# Patient Record
Sex: Female | Born: 1935
Health system: Southern US, Community
[De-identification: ages and names within clinical notes are randomized; demographics above are authoritative.]

## PROBLEM LIST (undated history)

## (undated) DIAGNOSIS — I714 Abdominal aortic aneurysm, without rupture, unspecified: Secondary | ICD-10-CM

## (undated) DIAGNOSIS — K5730 Diverticulosis of large intestine without perforation or abscess without bleeding: Secondary | ICD-10-CM

## (undated) DIAGNOSIS — M545 Low back pain, unspecified: Secondary | ICD-10-CM

## (undated) DIAGNOSIS — I639 Cerebral infarction, unspecified: Secondary | ICD-10-CM

## (undated) DIAGNOSIS — K2270 Barrett's esophagus without dysplasia: Secondary | ICD-10-CM

## (undated) DIAGNOSIS — H3530 Unspecified macular degeneration: Secondary | ICD-10-CM

## (undated) DIAGNOSIS — I10 Essential (primary) hypertension: Secondary | ICD-10-CM

## (undated) DIAGNOSIS — B029 Zoster without complications: Secondary | ICD-10-CM

## (undated) DIAGNOSIS — J449 Chronic obstructive pulmonary disease, unspecified: Secondary | ICD-10-CM

## (undated) DIAGNOSIS — K219 Gastro-esophageal reflux disease without esophagitis: Secondary | ICD-10-CM

## (undated) DIAGNOSIS — M719 Bursopathy, unspecified: Secondary | ICD-10-CM

## (undated) DIAGNOSIS — I5189 Other ill-defined heart diseases: Secondary | ICD-10-CM

## (undated) DIAGNOSIS — K922 Gastrointestinal hemorrhage, unspecified: Secondary | ICD-10-CM

## (undated) DIAGNOSIS — M1990 Unspecified osteoarthritis, unspecified site: Secondary | ICD-10-CM

## (undated) DIAGNOSIS — E785 Hyperlipidemia, unspecified: Secondary | ICD-10-CM

## (undated) DIAGNOSIS — F419 Anxiety disorder, unspecified: Secondary | ICD-10-CM

## (undated) DIAGNOSIS — I719 Aortic aneurysm of unspecified site, without rupture: Secondary | ICD-10-CM

## (undated) DIAGNOSIS — F329 Major depressive disorder, single episode, unspecified: Secondary | ICD-10-CM

## (undated) DIAGNOSIS — D509 Iron deficiency anemia, unspecified: Secondary | ICD-10-CM

## (undated) DIAGNOSIS — F32A Depression, unspecified: Secondary | ICD-10-CM

## (undated) DIAGNOSIS — I2510 Atherosclerotic heart disease of native coronary artery without angina pectoris: Secondary | ICD-10-CM

## (undated) HISTORY — PX: OTHER SURGICAL HISTORY: SHX169

## (undated) HISTORY — DX: Major depressive disorder, single episode, unspecified: F32.9

## (undated) HISTORY — DX: Hyperlipidemia, unspecified: E78.5

## (undated) HISTORY — DX: Essential (primary) hypertension: I10

## (undated) HISTORY — DX: Depression, unspecified: F32.A

## (undated) HISTORY — PX: CARDIAC CATHETERIZATION: SHX172

## (undated) HISTORY — DX: Unspecified macular degeneration: H35.30

## (undated) HISTORY — DX: Anxiety disorder, unspecified: F41.9

## (undated) HISTORY — DX: Low back pain, unspecified: M54.50

## (undated) HISTORY — PX: APPENDECTOMY: SHX54

## (undated) HISTORY — DX: Zoster without complications: B02.9

## (undated) HISTORY — DX: Diverticulosis of large intestine without perforation or abscess without bleeding: K57.30

## (undated) HISTORY — DX: Cerebral infarction, unspecified: I63.9

## (undated) HISTORY — DX: Gastrointestinal hemorrhage, unspecified: K92.2

## (undated) HISTORY — DX: Aortic aneurysm of unspecified site, without rupture: I71.9

## (undated) HISTORY — DX: Gastro-esophageal reflux disease without esophagitis: K21.9

## (undated) HISTORY — DX: Barrett's esophagus without dysplasia: K22.70

## (undated) HISTORY — DX: Bursopathy, unspecified: M71.9

## (undated) HISTORY — PX: BACK SURGERY: SHX140

## (undated) HISTORY — DX: Low back pain: M54.5

## (undated) HISTORY — DX: Atherosclerotic heart disease of native coronary artery without angina pectoris: I25.10

---

## 2004-06-06 ENCOUNTER — Ambulatory Visit: Payer: Self-pay | Admitting: Internal Medicine

## 2004-06-18 ENCOUNTER — Emergency Department: Payer: Self-pay | Admitting: Emergency Medicine

## 2004-06-21 ENCOUNTER — Ambulatory Visit: Payer: Self-pay | Admitting: Internal Medicine

## 2004-06-22 ENCOUNTER — Ambulatory Visit: Payer: Self-pay | Admitting: Internal Medicine

## 2005-03-20 ENCOUNTER — Ambulatory Visit: Payer: Self-pay | Admitting: Internal Medicine

## 2005-04-23 DIAGNOSIS — K2270 Barrett's esophagus without dysplasia: Secondary | ICD-10-CM

## 2005-04-23 HISTORY — DX: Barrett's esophagus without dysplasia: K22.70

## 2005-05-01 ENCOUNTER — Ambulatory Visit: Payer: Self-pay | Admitting: Internal Medicine

## 2005-05-30 ENCOUNTER — Ambulatory Visit: Payer: Self-pay | Admitting: Internal Medicine

## 2005-07-26 ENCOUNTER — Ambulatory Visit: Payer: Self-pay | Admitting: Internal Medicine

## 2005-08-29 ENCOUNTER — Ambulatory Visit: Payer: Self-pay | Admitting: Gastroenterology

## 2006-04-17 ENCOUNTER — Other Ambulatory Visit: Payer: Self-pay

## 2006-04-17 ENCOUNTER — Emergency Department: Payer: Self-pay | Admitting: General Practice

## 2006-08-22 LAB — HM COLONOSCOPY: HM Colonoscopy: NEGATIVE

## 2006-09-03 ENCOUNTER — Ambulatory Visit: Payer: Self-pay | Admitting: Internal Medicine

## 2006-09-12 ENCOUNTER — Emergency Department: Payer: Self-pay | Admitting: Emergency Medicine

## 2006-09-17 ENCOUNTER — Ambulatory Visit: Payer: Self-pay | Admitting: Gastroenterology

## 2007-06-16 ENCOUNTER — Ambulatory Visit: Payer: Self-pay | Admitting: Internal Medicine

## 2007-06-24 ENCOUNTER — Ambulatory Visit: Payer: Self-pay | Admitting: Internal Medicine

## 2007-06-26 ENCOUNTER — Ambulatory Visit: Payer: Self-pay | Admitting: Internal Medicine

## 2007-10-09 ENCOUNTER — Ambulatory Visit: Payer: Self-pay | Admitting: Internal Medicine

## 2007-11-18 ENCOUNTER — Ambulatory Visit: Payer: Self-pay | Admitting: Gastroenterology

## 2008-10-12 ENCOUNTER — Ambulatory Visit: Payer: Self-pay | Admitting: Internal Medicine

## 2008-10-14 ENCOUNTER — Ambulatory Visit: Payer: Self-pay | Admitting: Vascular Surgery

## 2008-10-21 HISTORY — PX: ABDOMINAL AORTIC ANEURYSM REPAIR: SUR1152

## 2008-11-02 ENCOUNTER — Ambulatory Visit: Payer: Self-pay | Admitting: Vascular Surgery

## 2008-11-12 ENCOUNTER — Inpatient Hospital Stay: Payer: Self-pay | Admitting: Vascular Surgery

## 2009-04-26 ENCOUNTER — Ambulatory Visit: Payer: Self-pay | Admitting: Internal Medicine

## 2009-08-18 ENCOUNTER — Ambulatory Visit: Payer: Self-pay | Admitting: Internal Medicine

## 2009-09-09 ENCOUNTER — Encounter: Admission: RE | Admit: 2009-09-09 | Discharge: 2009-09-09 | Payer: Self-pay | Admitting: Neurological Surgery

## 2009-12-16 ENCOUNTER — Ambulatory Visit: Payer: Self-pay | Admitting: Internal Medicine

## 2010-04-23 DIAGNOSIS — D509 Iron deficiency anemia, unspecified: Secondary | ICD-10-CM

## 2010-04-23 DIAGNOSIS — M719 Bursopathy, unspecified: Secondary | ICD-10-CM

## 2010-04-23 HISTORY — DX: Iron deficiency anemia, unspecified: D50.9

## 2010-04-23 HISTORY — DX: Bursopathy, unspecified: M71.9

## 2010-08-15 LAB — HEPATIC FUNCTION PANEL
AST: 19 U/L (ref 13–35)
Bilirubin, Total: 0.3 mg/dL

## 2010-08-15 LAB — BASIC METABOLIC PANEL: BUN: 20 mg/dL (ref 4–21)

## 2010-10-05 ENCOUNTER — Inpatient Hospital Stay: Payer: Self-pay | Admitting: Internal Medicine

## 2010-11-06 ENCOUNTER — Emergency Department: Payer: Self-pay | Admitting: Unknown Physician Specialty

## 2010-12-08 ENCOUNTER — Encounter: Payer: Self-pay | Admitting: Internal Medicine

## 2010-12-08 DIAGNOSIS — G3184 Mild cognitive impairment, so stated: Secondary | ICD-10-CM | POA: Insufficient documentation

## 2010-12-08 DIAGNOSIS — K5730 Diverticulosis of large intestine without perforation or abscess without bleeding: Secondary | ICD-10-CM | POA: Insufficient documentation

## 2010-12-08 DIAGNOSIS — K2270 Barrett's esophagus without dysplasia: Secondary | ICD-10-CM | POA: Insufficient documentation

## 2010-12-08 DIAGNOSIS — F331 Major depressive disorder, recurrent, moderate: Secondary | ICD-10-CM | POA: Insufficient documentation

## 2010-12-08 DIAGNOSIS — M545 Low back pain, unspecified: Secondary | ICD-10-CM | POA: Insufficient documentation

## 2010-12-08 DIAGNOSIS — M810 Age-related osteoporosis without current pathological fracture: Secondary | ICD-10-CM | POA: Insufficient documentation

## 2010-12-08 DIAGNOSIS — I10 Essential (primary) hypertension: Secondary | ICD-10-CM

## 2010-12-08 DIAGNOSIS — E785 Hyperlipidemia, unspecified: Secondary | ICD-10-CM | POA: Insufficient documentation

## 2010-12-08 DIAGNOSIS — R4189 Other symptoms and signs involving cognitive functions and awareness: Secondary | ICD-10-CM

## 2010-12-08 DIAGNOSIS — F329 Major depressive disorder, single episode, unspecified: Secondary | ICD-10-CM

## 2010-12-15 ENCOUNTER — Ambulatory Visit (INDEPENDENT_AMBULATORY_CARE_PROVIDER_SITE_OTHER): Payer: Medicare Other | Admitting: Internal Medicine

## 2010-12-15 ENCOUNTER — Encounter: Payer: Self-pay | Admitting: Internal Medicine

## 2010-12-15 DIAGNOSIS — I10 Essential (primary) hypertension: Secondary | ICD-10-CM

## 2010-12-15 DIAGNOSIS — I714 Abdominal aortic aneurysm, without rupture: Secondary | ICD-10-CM

## 2010-12-15 DIAGNOSIS — C44319 Basal cell carcinoma of skin of other parts of face: Secondary | ICD-10-CM

## 2010-12-15 DIAGNOSIS — M810 Age-related osteoporosis without current pathological fracture: Secondary | ICD-10-CM

## 2010-12-15 DIAGNOSIS — C44301 Unspecified malignant neoplasm of skin of nose: Secondary | ICD-10-CM

## 2010-12-15 DIAGNOSIS — F329 Major depressive disorder, single episode, unspecified: Secondary | ICD-10-CM

## 2010-12-15 DIAGNOSIS — R4189 Other symptoms and signs involving cognitive functions and awareness: Secondary | ICD-10-CM

## 2010-12-15 DIAGNOSIS — E569 Vitamin deficiency, unspecified: Secondary | ICD-10-CM

## 2010-12-15 DIAGNOSIS — K5730 Diverticulosis of large intestine without perforation or abscess without bleeding: Secondary | ICD-10-CM

## 2010-12-15 DIAGNOSIS — F09 Unspecified mental disorder due to known physiological condition: Secondary | ICD-10-CM

## 2010-12-15 DIAGNOSIS — Z1239 Encounter for other screening for malignant neoplasm of breast: Secondary | ICD-10-CM

## 2010-12-15 DIAGNOSIS — E785 Hyperlipidemia, unspecified: Secondary | ICD-10-CM

## 2010-12-15 DIAGNOSIS — Z79899 Other long term (current) drug therapy: Secondary | ICD-10-CM

## 2010-12-15 MED ORDER — ALENDRONATE SODIUM 70 MG PO TABS: 70.0000 mg | ORAL_TABLET | ORAL | Status: DC

## 2010-12-15 NOTE — Assessment & Plan Note (Signed)
well controlled  

## 2010-12-15 NOTE — Patient Instructions (Addendum)
Please start alendronate one tablet weekly to strengthen your bones  You should also take a minimum of 2 calcium tablets or chewables daily for your bones  Return in late October for labs

## 2010-12-17 DIAGNOSIS — C44301 Unspecified malignant neoplasm of skin of nose: Secondary | ICD-10-CM | POA: Insufficient documentation

## 2010-12-17 DIAGNOSIS — E785 Hyperlipidemia, unspecified: Secondary | ICD-10-CM | POA: Insufficient documentation

## 2010-12-17 DIAGNOSIS — Z8679 Personal history of other diseases of the circulatory system: Secondary | ICD-10-CM | POA: Insufficient documentation

## 2010-12-17 DIAGNOSIS — Z79899 Other long term (current) drug therapy: Secondary | ICD-10-CM | POA: Insufficient documentation

## 2010-12-17 DIAGNOSIS — E559 Vitamin D deficiency, unspecified: Secondary | ICD-10-CM | POA: Insufficient documentation

## 2010-12-17 DIAGNOSIS — Z1239 Encounter for other screening for malignant neoplasm of breast: Secondary | ICD-10-CM | POA: Insufficient documentation

## 2010-12-17 NOTE — Assessment & Plan Note (Signed)
Her daughter is worried about her short term memory loss as a sign of Alzhemiers Dementia.  We discussed the etiology og conginitiver dysfunction and the likelihood that Anna Prince's memor issues may be secondary to undertreated depression/anxiety vs early vascular dementia (given her history of PAD) .  She has not had a brain MRI, but serologies for b12  , folate and thyroid have been done.

## 2010-12-17 NOTE — Progress Notes (Signed)
  Subjective:    Patient ID: Anna Prince, female    DOB: [birth year], 77 y.o.   MRN: [id number]  HPI Anna Prince is a 77 year old white female with a history of PAD s/p aortic aneursym repair, tobacco abuse and depression who retuesn for one month followup.  She feels generally well, no specific complaints,  Except chronic fatigue nad low energy.  Her appetite has improved a little with iniation of  Remeron, and she is sleeping well.    Review of Systems  Constitutional: Negative for fever, chills and unexpected weight change.  HENT: Negative for hearing loss, ear pain, nosebleeds, congestion, sore throat, facial swelling, rhinorrhea, sneezing, mouth sores, trouble swallowing, neck pain, neck stiffness, voice change, postnasal drip, sinus pressure, tinnitus and ear discharge.   Eyes: Negative for pain, discharge, redness and visual disturbance.  Respiratory: Negative for cough, chest tightness, shortness of breath, wheezing and stridor.   Cardiovascular: Negative for chest pain, palpitations and leg swelling.  Musculoskeletal: Negative for myalgias and arthralgias.  Skin: Positive for color change. Negative for rash.  Neurological: Negative for dizziness, speech difficulty, weakness and light-headedness.  Hematological: Negative for adenopathy.  Psychiatric/Behavioral: Positive for confusion, dysphoric mood and decreased concentration. Negative for suicidal ideas. The patient is nervous/anxious.        Objective:   Physical Exam  Constitutional: She is oriented to person, place, and time. She appears well-developed and well-nourished.  HENT:  Mouth/Throat: Oropharynx is clear and moist.  Eyes: EOM are normal. Pupils are equal, round, and reactive to light. No scleral icterus.  Neck: Normal range of motion. Neck supple. No JVD present. No thyromegaly present.  Cardiovascular: Normal rate, regular rhythm, normal heart sounds and intact distal pulses.   Pulmonary/Chest: Effort normal and  breath sounds normal.  Abdominal: Soft. Bowel sounds are normal. She exhibits no mass. There is no tenderness.  Musculoskeletal: Normal range of motion. She exhibits no edema.  Lymphadenopathy:    She has no cervical adenopathy.  Neurological: She is alert and oriented to person, place, and time.  Skin: Skin is warm and dry.  Psychiatric: She has a normal mood and affect.          Assessment & Plan:

## 2010-12-17 NOTE — Assessment & Plan Note (Signed)
With admission July 2012 for lower GI bleed secondary to infectious colitis.

## 2010-12-17 NOTE — Assessment & Plan Note (Addendum)
By last DEXA 2009,  She stopped taking weekly alendronate for unclear reasons, and is not taking calcium or Vit D supplements regularly either.  Discussed treatment options and she is planning to resume fosamax and Calcium/D

## 2010-12-17 NOTE — Assessment & Plan Note (Signed)
She has a superficial nonhealing linear area on the tip of her nose thatI have recommended she get biopsied to rule out squamous cell CA.  Referral to [facility] underway

## 2010-12-17 NOTE — Assessment & Plan Note (Addendum)
Aggravated by recent death of husband.  I have recommended that she incrtease her Remorn dose to 15 mg daily.

## 2010-12-26 ENCOUNTER — Inpatient Hospital Stay: Payer: Self-pay | Admitting: Internal Medicine

## 2010-12-27 DIAGNOSIS — D62 Acute posthemorrhagic anemia: Secondary | ICD-10-CM

## 2010-12-27 DIAGNOSIS — K922 Gastrointestinal hemorrhage, unspecified: Secondary | ICD-10-CM

## 2010-12-27 DIAGNOSIS — N179 Acute kidney failure, unspecified: Secondary | ICD-10-CM

## 2011-01-05 ENCOUNTER — Encounter: Payer: Self-pay | Admitting: Internal Medicine

## 2011-01-08 ENCOUNTER — Other Ambulatory Visit: Payer: Self-pay | Admitting: Internal Medicine

## 2011-01-08 ENCOUNTER — Encounter: Payer: Self-pay | Admitting: Internal Medicine

## 2011-01-08 NOTE — Telephone Encounter (Signed)
Pt daughter called about [facility] rx  For oxycondone 5-325     Pt daughter has several ? For [employer]

## 2011-01-09 ENCOUNTER — Other Ambulatory Visit: Payer: Self-pay | Admitting: Internal Medicine

## 2011-01-09 NOTE — Telephone Encounter (Signed)
When patietn left hospital she was not requiring narcotics for pain.  Please clarify what the oxycodone is needed for

## 2011-01-09 NOTE — Telephone Encounter (Signed)
Daughter says that patient has been hurting in her hips and back.

## 2011-01-10 MED ORDER — OMEPRAZOLE 40 MG PO CPDR: 40.0000 mg | DELAYED_RELEASE_CAPSULE | Freq: Every day | ORAL | Status: DC

## 2011-01-10 MED ORDER — OXYCODONE-ACETAMINOPHEN 5-325 MG PO TABS: 1.0000 | ORAL_TABLET | Freq: Four times a day (QID) | ORAL | Status: DC | PRN

## 2011-01-11 NOTE — Telephone Encounter (Signed)
Left messaged letting patient know that her rx is ready. Rx left in front office for pick up.

## 2011-01-22 ENCOUNTER — Other Ambulatory Visit (INDEPENDENT_AMBULATORY_CARE_PROVIDER_SITE_OTHER): Payer: Medicare Other | Admitting: *Deleted

## 2011-01-22 DIAGNOSIS — E569 Vitamin deficiency, unspecified: Secondary | ICD-10-CM

## 2011-01-22 DIAGNOSIS — Z79899 Other long term (current) drug therapy: Secondary | ICD-10-CM

## 2011-01-22 DIAGNOSIS — E785 Hyperlipidemia, unspecified: Secondary | ICD-10-CM

## 2011-01-22 LAB — COMPREHENSIVE METABOLIC PANEL
AST: 16 U/L (ref 0–37)
Albumin: 3.5 g/dL (ref 3.5–5.2)
BUN: 20 mg/dL (ref 6–23)
Calcium: 9.2 mg/dL (ref 8.4–10.5)
GFR: 54.02 mL/min — ABNORMAL LOW (ref >60.00)
Total Bilirubin: 0.5 mg/dL (ref 0.3–1.2)
Total Protein: 7 g/dL (ref 6.0–8.3)

## 2011-01-22 LAB — LIPID PANEL
HDL: 60.6 mg/dL (ref >39.00)
LDL Cholesterol: 114 mg/dL — ABNORMAL HIGH (ref 0–99)
Total CHOL/HDL Ratio: 3
Triglycerides: 89 mg/dL (ref 0.0–149.0)

## 2011-01-22 NOTE — Progress Notes (Signed)
Addended by: Jobie Quaker on: 01/22/2011 08:07 AM   Modules accepted: Orders

## 2011-01-23 LAB — VITAMIN D 25 HYDROXY (VIT D DEFICIENCY, FRACTURES): Vit D, 25-Hydroxy: 44 ng/mL (ref 30–89)

## 2011-01-25 ENCOUNTER — Telehealth: Payer: Self-pay | Admitting: Internal Medicine

## 2011-01-25 MED ORDER — HYDROCODONE-ACETAMINOPHEN 5-500 MG PO TABS: 1.0000 | ORAL_TABLET | Freq: Four times a day (QID) | ORAL | Status: DC | PRN

## 2011-01-25 NOTE — Telephone Encounter (Signed)
Rx has been called in, and patient notified.

## 2011-01-25 NOTE — Telephone Encounter (Signed)
Patient called and wanted to know if you could put her back on Vicodin instead of Percocet.  She stated the Vicodin seemed to work better than the Percocet is.  Please advise.

## 2011-01-25 NOTE — Telephone Encounter (Signed)
Yes, you can call her in vicodin 5/500 one tablet eery 6 hours as needed for pain  Qty #90 1 refill.

## 2011-01-29 ENCOUNTER — Ambulatory Visit: Payer: Self-pay | Admitting: Unknown Physician Specialty

## 2011-01-31 LAB — PATHOLOGY REPORT

## 2011-02-04 ENCOUNTER — Other Ambulatory Visit: Payer: Self-pay | Admitting: Internal Medicine

## 2011-02-04 DIAGNOSIS — K5730 Diverticulosis of large intestine without perforation or abscess without bleeding: Secondary | ICD-10-CM

## 2011-02-04 DIAGNOSIS — K2270 Barrett's esophagus without dysplasia: Secondary | ICD-10-CM

## 2011-02-04 NOTE — Assessment & Plan Note (Signed)
Confirmed by colonoscopy 01/29/11 by Dr Mechele Collin, along iwith internal hemorrhoids.  No routine repeat ordered

## 2011-02-04 NOTE — Assessment & Plan Note (Signed)
Surveillance endoscopy done 01/29/11 shoed suspciiosu changes and were biopsied.

## 2011-02-22 ENCOUNTER — Encounter: Payer: Self-pay | Admitting: Internal Medicine

## 2011-03-02 ENCOUNTER — Other Ambulatory Visit: Payer: Self-pay | Admitting: Internal Medicine

## 2011-03-04 ENCOUNTER — Other Ambulatory Visit: Payer: Self-pay | Admitting: Internal Medicine

## 2011-03-05 ENCOUNTER — Ambulatory Visit: Payer: Self-pay | Admitting: Internal Medicine

## 2011-03-06 ENCOUNTER — Telehealth: Payer: Self-pay | Admitting: *Deleted

## 2011-03-06 NOTE — Telephone Encounter (Signed)
Ok to refill valium  #60 with 3 additional refills.

## 2011-03-06 NOTE — Telephone Encounter (Signed)
Pharm faxed RF request -  Diazepam 5 mg 1 bid prn #60. Please advise

## 2011-03-07 MED ORDER — DIAZEPAM 5 MG PO TABS: 5.0000 mg | ORAL_TABLET | Freq: Two times a day (BID) | ORAL | Status: DC | PRN

## 2011-03-14 ENCOUNTER — Encounter: Payer: Self-pay | Admitting: Internal Medicine

## 2011-03-20 ENCOUNTER — Other Ambulatory Visit: Payer: Self-pay | Admitting: Internal Medicine

## 2011-03-20 MED ORDER — HYDROCODONE-ACETAMINOPHEN 5-500 MG PO TABS: 1.0000 | ORAL_TABLET | Freq: Four times a day (QID) | ORAL | Status: DC | PRN

## 2011-03-20 NOTE — Telephone Encounter (Signed)
Ok to refill #90 hydrocodone  With 2 refills

## 2011-04-26 ENCOUNTER — Other Ambulatory Visit: Payer: Self-pay | Admitting: *Deleted

## 2011-04-26 MED ORDER — LISINOPRIL 40 MG PO TABS: 40.0000 mg | ORAL_TABLET | Freq: Every day | ORAL | Status: DC

## 2011-04-26 NOTE — Telephone Encounter (Signed)
Faxed request from [facility], last filled 12/06/10.

## 2011-06-14 ENCOUNTER — Other Ambulatory Visit: Payer: Self-pay | Admitting: *Deleted

## 2011-06-15 MED ORDER — HYDROCODONE-ACETAMINOPHEN 5-500 MG PO TABS: 1.0000 | ORAL_TABLET | Freq: Four times a day (QID) | ORAL | Status: DC | PRN

## 2011-06-18 ENCOUNTER — Ambulatory Visit (INDEPENDENT_AMBULATORY_CARE_PROVIDER_SITE_OTHER): Payer: Medicare Other | Admitting: Internal Medicine

## 2011-06-18 ENCOUNTER — Encounter: Payer: Self-pay | Admitting: Internal Medicine

## 2011-06-18 ENCOUNTER — Other Ambulatory Visit: Payer: Self-pay | Admitting: Internal Medicine

## 2011-06-18 DIAGNOSIS — M719 Bursopathy, unspecified: Secondary | ICD-10-CM | POA: Insufficient documentation

## 2011-06-18 DIAGNOSIS — Z1211 Encounter for screening for malignant neoplasm of colon: Secondary | ICD-10-CM | POA: Insufficient documentation

## 2011-06-18 DIAGNOSIS — M545 Low back pain, unspecified: Secondary | ICD-10-CM

## 2011-06-18 DIAGNOSIS — Z79899 Other long term (current) drug therapy: Secondary | ICD-10-CM

## 2011-06-18 DIAGNOSIS — J4489 Other specified chronic obstructive pulmonary disease: Secondary | ICD-10-CM

## 2011-06-18 DIAGNOSIS — J449 Chronic obstructive pulmonary disease, unspecified: Secondary | ICD-10-CM

## 2011-06-18 DIAGNOSIS — Z0000 Encounter for general adult medical examination without abnormal findings: Secondary | ICD-10-CM

## 2011-06-18 LAB — COMPREHENSIVE METABOLIC PANEL
AST: 15 U/L (ref 0–37)
BUN: 19 mg/dL (ref 6–23)
CO2: 29 mEq/L (ref 19–32)
Chloride: 105 mEq/L (ref 96–112)
GFR: 50.09 mL/min — ABNORMAL LOW (ref >60.00)
Potassium: 4.4 mEq/L (ref 3.5–5.1)
Sodium: 139 mEq/L (ref 135–145)

## 2011-06-18 MED ORDER — HYDROCODONE-ACETAMINOPHEN 5-500 MG PO TABS: 1.0000 | ORAL_TABLET | Freq: Four times a day (QID) | ORAL | Status: DC | PRN

## 2011-06-18 NOTE — Assessment & Plan Note (Signed)
chronic , with 2 prior back surgeries,  And ongoign pain due to arthritis  No moe interventions planned per neurosurgeon at [facility] Dr. Manson Passey.   Her pain improved  with steroid shots for about 3 weeks done by [facility]r now her isnurance has changed and will not cover the Pain Clinic at [facility].  Does not want referral for local Pain clinic since her pain is improved since treating the hip bursitis. Using 2 vicodin daily maximum.

## 2011-06-18 NOTE — Progress Notes (Signed)
Subjective:    Patient ID: Anna Prince, female    DOB: [birth year], 78 y.o.   MRN: [id number]  HPI The patient is here for annual Medicare wellness examination and management of other chronic and acute problems.   The risk factors are reflected in the social history.  The roster of all physicians providing medical care to patient - is listed in the Snapshot section of the chart.  Activities of daily living:  The patient is 100% independent in all ADLs: dressing, toileting, feeding as well as independent mobility  Home safety : The patient has smoke detectors in the home. They wear seatbelts.  There are no firearms at home. There is no violence in the home.   There is no risks for hepatitis, STDs or HIV. There is no   history of blood transfusion. They have no travel history to infectious disease endemic areas of the world.  The patient has seen their dentist in the last six month. They have seen their eye doctor in the last year. They admit to slight hearing difficulty with regard to whispered voices and some television programs.  They have deferred audiologic testing in the last year.  They do not  have excessive sun exposure. Discussed the need for sun protection: hats, long sleeves and use of sunscreen if there is significant sun exposure.   Diet: the importance of a healthy diet is discussed. They do have a healthy diet.  The benefits of regular aerobic exercise were discussed. She walks 4 times per week ,  20 minutes.   Depression screen: there are no signs or vegative symptoms of depression- irritability, change in appetite, anhedonia, sadness/tearfullness.  Cognitive assessment: the patient manages all their financial and personal affairs and is actively engaged. They could relate day,date,year and events; recalled 2/3 objects at 3 minutes; performed clock-face test normally.  The following portions of the patient's history were reviewed and updated as appropriate: allergies,  current medications, past family history, past medical history,  past surgical history, past social history  and problem list.  Visual acuity was not assessed per patient preference since she has regular follow up with her ophthalmologist. Hearing and body mass index were assessed and reviewed.   During the course of the visit the patient was educated and counseled about appropriate screening and preventive services including : fall prevention , diabetes screening, nutrition counseling, colorectal cancer screening, and recommended immunizations.      Review of Systems  Constitutional: Negative for fever, chills and unexpected weight change.  HENT: Negative for hearing loss, ear pain, nosebleeds, congestion, sore throat, facial swelling, rhinorrhea, sneezing, mouth sores, trouble swallowing, neck pain, neck stiffness, voice change, postnasal drip, sinus pressure, tinnitus and ear discharge.   Eyes: Negative for pain, discharge, redness and visual disturbance.  Respiratory: Negative for cough, chest tightness, shortness of breath, wheezing and stridor.   Cardiovascular: Negative for chest pain, palpitations and leg swelling.  Musculoskeletal: Negative for myalgias and arthralgias.  Skin: Negative for color change and rash.  Neurological: Negative for dizziness, weakness, light-headedness and headaches.  Hematological: Negative for adenopathy.       Objective:   Physical Exam  Constitutional: She is oriented to person, place, and time. She appears well-developed and well-nourished.  HENT:  Mouth/Throat: Oropharynx is clear and moist.  Eyes: EOM are normal. Pupils are equal, round, and reactive to light. No scleral icterus.  Neck: Normal range of motion. Neck supple. No JVD present. No thyromegaly present.  Cardiovascular: Normal rate, regular  rhythm, normal heart sounds and intact distal pulses.   Pulmonary/Chest: Effort normal and breath sounds normal.  Abdominal: Soft. Bowel sounds are  normal. She exhibits no mass. There is no tenderness.  Musculoskeletal: Normal range of motion. She exhibits no edema.  Lymphadenopathy:    She has no cervical adenopathy.  Neurological: She is alert and oriented to person, place, and time.  Skin: Skin is warm and dry.  Psychiatric: She has a normal mood and affect.        Assessment & Plan:   Normal exam: she is up to date on screening for colon Ca, breast cancer, and osteoporosis.  . Lumbago chronic , with 2 prior back surgeries,  And ongoign pain due to arthritis  No moe interventions planned per neurosurgeon at [facility] Dr. Manson Passey.   Her pain improved  with steroid shots for about 3 weeks done by [facility]r now her isnurance has changed and will not cover the Pain Clinic at [facility].  Does not want referral for local Pain clinic since her pain is improved since treating the hip bursitis. Using 2 vicodin daily maximum.   Screening for colon cancer Was done durin hospitalization  at [facility] in 2012 for abdominal pain   COPD (chronic obstructive pulmonary disease) She has mild wheezing on exam.  Trial of Symbicort x 4 weeks, will change to Qvar after that    Updated Medication List Outpatient Encounter Prescriptions as of 06/18/2011  Medication Sig Dispense Refill  . Cyanocobalamin (VITAMIN B-12 IJ) Inject as directed every 30 (thirty) days.        . diazepam (VALIUM) 5 MG tablet Take 1 tablet (5 mg total) by mouth 2 (two) times daily as needed for anxiety. Take one half tablet at bedtime  60 tablet  3  . lisinopril (PRINIVIL,ZESTRIL) 40 MG tablet Take 1 tablet (40 mg total) by mouth daily.  90 tablet  5  . lovastatin (MEVACOR) 40 MG tablet TAKE 1 TABLET EVERY DAY  90 tablet  2  . mirtazapine (REMERON) 15 MG tablet TAKE 1 TABLET BY MOUTH AT BEDTIME  30 tablet  3  . omeprazole (PRILOSEC) 40 MG capsule Take 1 capsule (40 mg total) by mouth daily before breakfast.  90 capsule  3  . DISCONTD: HYDROcodone-acetaminophen (VICODIN)  5-500 MG per tablet Take 1 tablet by mouth every 6 (six) hours as needed for pain.  90 tablet  2  . DISCONTD: alendronate (FOSAMAX) 70 MG tablet Take 1 tablet (70 mg total) by mouth every 7 (seven) days. Take with a full glass of water on an empty stomach.  4 tablet  11  . DISCONTD: mirtazapine (REMERON) 7.5 MG tablet Take 7.5 mg by mouth at bedtime.        Marland Kitchen DISCONTD: oxyCODONE-acetaminophen (PERCOCET) 5-325 MG per tablet Take 1 tablet by mouth every 6 (six) hours as needed.  30 tablet  0

## 2011-06-18 NOTE — Patient Instructions (Addendum)
I am starting you on Symbicort for your lungs because you are wheezing   Inhale 2 puffs twice a day for one month,  Then I will call you in a cheaper daily inhaler to continue after you finish the two samples    Please call when you are down  to the last  30 puffs.    Return in April/May for fasting labs a,d in 6 months to see me.

## 2011-06-18 NOTE — Assessment & Plan Note (Signed)
Was done durin hospitalization  at [facility] in 2012 for abdominal pain

## 2011-06-18 NOTE — Assessment & Plan Note (Addendum)
She has mild wheezing on exam.  Trial of Symbicort x 4 weeks, will change to Qvar after that

## 2011-06-19 NOTE — Progress Notes (Signed)
Quick Note:  Your liver and kidney functions are normal. ______

## 2011-06-25 ENCOUNTER — Observation Stay: Payer: Self-pay | Admitting: Internal Medicine

## 2011-06-25 ENCOUNTER — Telehealth: Payer: Self-pay | Admitting: Internal Medicine

## 2011-06-25 LAB — COMPREHENSIVE METABOLIC PANEL
Albumin: 3.5 g/dL (ref 3.4–5.0)
Alkaline Phosphatase: 93 U/L (ref 50–136)
Anion Gap: 13 (ref 7–16)
BUN: 19 mg/dL — ABNORMAL HIGH (ref 7–18)
Bilirubin,Total: 0.4 mg/dL (ref 0.2–1.0)
Chloride: 105 mmol/L (ref 98–107)
Creatinine: 1.29 mg/dL (ref 0.60–1.30)
EGFR (African American): 52 — ABNORMAL LOW
Osmolality: 285 (ref 275–301)
Potassium: 4.2 mmol/L (ref 3.5–5.1)
SGPT (ALT): 20 U/L
Sodium: 141 mmol/L (ref 136–145)
Total Protein: 7.2 g/dL (ref 6.4–8.2)

## 2011-06-25 LAB — CBC
HCT: 37.7 % (ref 35.0–47.0)
MCHC: 33 g/dL (ref 32.0–36.0)
RDW: 13.4 % (ref 11.5–14.5)

## 2011-06-25 NOTE — Telephone Encounter (Signed)
Office Message [address] [place], [place] [address] p. [phone] f. [phone] To: [facility] (Daytime Triage) Fax: [phone] From: [facility] Date/ Time: 06/25/2011 1:22 PM Taken By: Gerri Spore, CSR Caller: Selena Batten Facility: not collected Patient: Anna Prince, Anna Prince DOB: Dec 26, 1931 Phone: [phone] Reason for Call: Just sent her Mom to the hospital. Has been passing out, daughter states she was "talking jibberish and passed away at one piont and she threw water on her". Called 9-1-1 and they took her to [facility]. Regarding Appointment: Appt Date: Appt Time: Unknown Provider: Reason: Details: Outcome:

## 2011-06-26 LAB — CBC WITH DIFFERENTIAL/PLATELET
Eosinophil #: 0.2 10*3/uL (ref 0.0–0.7)
Lymphocyte #: 0.8 10*3/uL — ABNORMAL LOW (ref 1.0–3.6)
MCH: 31 pg (ref 26.0–34.0)
MCHC: 33.5 g/dL (ref 32.0–36.0)
Monocyte #: 0.3 10*3/uL (ref 0.0–0.7)
Neutrophil %: 72 %
Platelet: 140 10*3/uL — ABNORMAL LOW (ref 150–440)
RBC: 3.18 10*6/uL — ABNORMAL LOW (ref 3.80–5.20)
RDW: 13.4 % (ref 11.5–14.5)

## 2011-06-26 LAB — BASIC METABOLIC PANEL
Anion Gap: 6 — ABNORMAL LOW (ref 7–16)
BUN: 16 mg/dL (ref 7–18)
Co2: 26 mmol/L (ref 21–32)
Creatinine: 1.17 mg/dL (ref 0.60–1.30)
EGFR (African American): 58 — ABNORMAL LOW
EGFR (Non-African Amer.): 48 — ABNORMAL LOW
Glucose: 86 mg/dL (ref 65–99)
Sodium: 143 mmol/L (ref 136–145)

## 2011-06-26 LAB — URINALYSIS, COMPLETE
Bilirubin,UR: NEGATIVE
Glucose,UR: NEGATIVE mg/dL (ref 0–75)
Specific Gravity: 1.01 (ref 1.003–1.030)
Squamous Epithelial: 17
WBC UR: 309 /HPF (ref 0–5)

## 2011-06-26 LAB — TROPONIN I: Troponin-I: <0.02 ng/mL

## 2011-06-27 LAB — BASIC METABOLIC PANEL
Anion Gap: 9 (ref 7–16)
Chloride: 113 mmol/L — ABNORMAL HIGH (ref 98–107)
Co2: 24 mmol/L (ref 21–32)
Creatinine: 1 mg/dL (ref 0.60–1.30)
EGFR (African American): >60
EGFR (Non-African Amer.): 57 — ABNORMAL LOW

## 2011-06-27 LAB — CBC WITH DIFFERENTIAL/PLATELET
Basophil #: 0 10*3/uL (ref 0.0–0.1)
Basophil %: 0.2 %
Eosinophil #: 0.2 10*3/uL (ref 0.0–0.7)
HCT: 29.7 % — ABNORMAL LOW (ref 35.0–47.0)
HGB: 9.9 g/dL — ABNORMAL LOW (ref 12.0–16.0)
Lymphocyte %: 18.4 %
MCH: 31.1 pg (ref 26.0–34.0)
MCHC: 33.5 g/dL (ref 32.0–36.0)
Neutrophil %: 69 %
Platelet: 140 10*3/uL — ABNORMAL LOW (ref 150–440)
RDW: 12.9 % (ref 11.5–14.5)

## 2011-06-27 LAB — CBC AND DIFFERENTIAL: Hemoglobin: 9.9 g/dL — AB (ref 12.0–16.0)

## 2011-06-29 ENCOUNTER — Telehealth: Payer: Self-pay | Admitting: Internal Medicine

## 2011-06-29 MED ORDER — SULFAMETHOXAZOLE-TRIMETHOPRIM 800-160 MG PO TABS: 1.0000 | ORAL_TABLET | Freq: Two times a day (BID) | ORAL | Status: AC

## 2011-06-29 NOTE — Telephone Encounter (Signed)
Please call her and tell her that the antibiotic she was discharged on will not rtreat her UTI, basd on the culture data I received.  She has to switch to Septra DS 1 tablet twice daily for 7 days  #14 no refills pls call in

## 2011-06-29 NOTE — Telephone Encounter (Signed)
Waiting for the sensitivities on the urine

## 2011-06-29 NOTE — Telephone Encounter (Signed)
Rx has been called in and patient notified 

## 2011-06-29 NOTE — Telephone Encounter (Signed)
Melissa from [facility] called on a critical lab for patient.  She stated she was seen on 3/4 at the hospital and her urine culture is growing ESBL positive for E. Coli.  She wanted to make sure patient was on the right antibiotic for this infection.

## 2011-06-29 NOTE — Telephone Encounter (Signed)
I checked your faxes again and patients results have still not come.

## 2011-07-01 LAB — URINE CULTURE

## 2011-07-02 ENCOUNTER — Other Ambulatory Visit: Payer: Self-pay | Admitting: Internal Medicine

## 2011-07-02 MED ORDER — MIRTAZAPINE 15 MG PO TABS: 15.0000 mg | ORAL_TABLET | Freq: Every day | ORAL | Status: DC

## 2011-07-13 ENCOUNTER — Encounter: Payer: Self-pay | Admitting: Internal Medicine

## 2011-07-13 ENCOUNTER — Ambulatory Visit (INDEPENDENT_AMBULATORY_CARE_PROVIDER_SITE_OTHER): Payer: Medicare Other | Admitting: Internal Medicine

## 2011-07-13 VITALS — BP 110/60 | HR 86 | Temp 97.9°F | Resp 14 | Wt 134.0 lb

## 2011-07-13 DIAGNOSIS — R531 Weakness: Secondary | ICD-10-CM

## 2011-07-13 DIAGNOSIS — I10 Essential (primary) hypertension: Secondary | ICD-10-CM

## 2011-07-13 DIAGNOSIS — Z79899 Other long term (current) drug therapy: Secondary | ICD-10-CM

## 2011-07-13 DIAGNOSIS — D649 Anemia, unspecified: Secondary | ICD-10-CM

## 2011-07-13 DIAGNOSIS — K529 Noninfective gastroenteritis and colitis, unspecified: Secondary | ICD-10-CM

## 2011-07-13 DIAGNOSIS — E785 Hyperlipidemia, unspecified: Secondary | ICD-10-CM

## 2011-07-13 DIAGNOSIS — R5381 Other malaise: Secondary | ICD-10-CM

## 2011-07-13 DIAGNOSIS — K5289 Other specified noninfective gastroenteritis and colitis: Secondary | ICD-10-CM

## 2011-07-13 DIAGNOSIS — I719 Aortic aneurysm of unspecified site, without rupture: Secondary | ICD-10-CM

## 2011-07-13 DIAGNOSIS — Z1211 Encounter for screening for malignant neoplasm of colon: Secondary | ICD-10-CM

## 2011-07-13 DIAGNOSIS — R634 Abnormal weight loss: Secondary | ICD-10-CM

## 2011-07-13 MED ORDER — DIAZEPAM 5 MG PO TABS: 5.0000 mg | ORAL_TABLET | Freq: Two times a day (BID) | ORAL | Status: DC | PRN

## 2011-07-13 MED ORDER — LISINOPRIL 10 MG PO TABS: 10.0000 mg | ORAL_TABLET | Freq: Every day | ORAL | Status: DC

## 2011-07-13 MED ORDER — CYANOCOBALAMIN 1000 MCG/ML IJ SOLN: 1000.0000 ug | INTRAMUSCULAR | Status: DC

## 2011-07-13 NOTE — Progress Notes (Signed)
Patient ID: Anna Prince, female   DOB: [birth year], 78 y.o.   MRN: [id number]  Patient Active Problem List  Diagnoses  . Diverticulosis, sigmoid  . Barrett's esophagus  . Hyperlipidemia  . Hypertension  . Lumbago  . Osteoporosis  . Depression  . Cognitive impairment  . Skin cancer of nose  . Unspecified vitamin deficiency  . Other and unspecified hyperlipidemia  . Screening for malignant neoplasm of breast  . Encounter for long-term (current) use of other medications  . Aortic aneurysm, abdominal  . Bursitis  . Screening for colon cancer  . COPD (chronic obstructive pulmonary disease)  . Aortic aneurysm  . Anemia  . GERD (gastroesophageal reflux disease)    Subjective:  CC:   Chief Complaint  Patient presents with  . Follow-up    Hospital    HPI:   Anna Prince a 78 y.o. female who presents for Hospital follow up.  She was admitted 3/4 to 3/6 for syncope secondary to gastroenteritis induced hypotension.  She has been slow to regain her strength because her left hip bursitis has made it difficult  to become more active on her own. She was advised and referred for outpatient PT but it has not been scheduled.  She had a very dissatisfied and experienced urgency. Her daughter reports that on the morning of admission patient went into cardiac arrest during the syncopal episode but she shook her out of it.  She states that she told the ER physician this but that nobody believed her. Patient was taken to ER and  Admitted to the Observation Unit .  Cardiac enzymes were normal x 3 and telemetry was normal.  She had a less than satisfactory experience.  Apparently she asked repeatedly for help with bathing/showering but she didn't receive a bath for 2 days.   No reason was mentioned.   she also notes that her ater pitcher was not refilled or changed until the day of discharge .  she has not complained to patient relations about the experience.     Past Medical History    Diagnosis Date  . Barrett's esophagus 2007  . Sigmoid diverticulosis     by colonoscopy  . Aortic aneurysm   . Macular degeneration   . Anxiety   . Hyperlipidemia   . Hypertension   . Osteoporosis   . Lumbago   . Depression   . Bursitis 2012    left hip, improved with periodic steroid injection [facility])  . Anemia 2012    of acute blood loss, resolved  . GERD (gastroesophageal reflux disease)     with Barretts Esophagus    Past Surgical History  Procedure Date  . Appendectomy   . Back surgery     X2..1975 arachnoid cyst cervical region(blumquist,g[place]);1997 lumbosacral tumor,9 hr surgery  . Abdominal aortic aneurysm repair July 2010    [facility]         The following portions of the patient's history were reviewed and updated as appropriate: Allergies, current medications, and problem list.    Review of Systems:   12 Pt  review of systems was negative except those addressed in the HPI,     History   Social History  . Marital Status: Widowed    Spouse Name: N/A    Number of Children: N/A  . Years of Education: N/A   Occupational History  . Not on file.   Social History Main Topics  . Smoking status: Current Everyday Smoker  Types: Cigarettes  . Smokeless tobacco: Never Used   Comment: HAS BEEN SMOKING 30+ YEARS,RESUMED TOBACCO USE AFTER AAA REPAIR  . Alcohol Use: No  . Drug Use: No  . Sexually Active: Not on file   Other Topics Concern  . Not on file   Social History Narrative  . No narrative on file    Objective:  BP 110/60  Pulse 86  Temp(Src) 97.9 F (36.6 C) (Oral)  Resp 14  Wt 134 lb (60.782 kg)  SpO2 98%  General appearance: alert, cooperative and appears stated age Ears: normal TM's and external ear canals both ears Throat: lips, mucosa, and tongue normal; teeth and gums normal Neck: no adenopathy, no carotid bruit, supple, symmetrical, trachea midline and thyroid not enlarged, symmetric, no tenderness/mass/nodules Back:  symmetric, no curvature. ROM normal. No CVA tenderness. Lungs: clear to auscultation bilaterally Heart: regular rate and rhythm, S1, S2 normal, no murmur, click, rub or gallop Abdomen: soft, non-tender; bowel sounds normal; no masses,  no organomegaly Pulses: 2+ and symmetric Skin: Skin color, texture, turgor normal. No rashes or lesions Lymph nodes: Cervical, supraclavicular, and axillary nodes normal.  Assessment and Plan:  Aortic aneurysm She underwent open abdominal repair in July 2010. Blood pressure has been well-controlled since then. She does continue to smoke counseling given. She'll need annual follow up with Dr. Earnestine Leys  Anemia She's had persistent anemia since 2012 when she had 2 admissions for bloody diarrhea. Her post hydration hemoglobin during this hospital admission was 9.9. She will need iron studies and iron supplementation if indicated.  Screening for colon cancer She had a sigmoid biopsy in 2008 and a normal a colonoscopy Nov 2012 following her last admission in September 2012 for diverticulitis with bloody diarrhea. Her colonoscopy was normal except for diverticulosis and internal hemorrhoids, and no future colonoscopies her planned per Dr. Mechele Collin.  Hypertension Her blood pressure has been soft since her last admission and her lisinopril  dose was reduced to 10 mg daily. Will continue 10 mg daily for now.    Updated Medication List Outpatient Encounter Prescriptions as of 07/13/2011  Medication Sig Dispense Refill  . cyanocobalamin (,VITAMIN B-12,) 1000 MCG/ML injection Inject 1 mL (1,000 mcg total) into the muscle every 30 (thirty) days.  10 mL  2  . diazepam (VALIUM) 5 MG tablet Take 1 tablet (5 mg total) by mouth 2 (two) times daily as needed for anxiety or sleep. Take one half tablet at bedtime  60 tablet  5  . HYDROcodone-acetaminophen (VICODIN) 5-500 MG per tablet Take 1 tablet by mouth every 6 (six) hours as needed for pain.  90 tablet  2  . lisinopril  (PRINIVIL,ZESTRIL) 10 MG tablet Take 1 tablet (10 mg total) by mouth daily.  30 tablet  6  . lovastatin (MEVACOR) 40 MG tablet TAKE 1 TABLET EVERY DAY  90 tablet  2  . mirtazapine (REMERON) 15 MG tablet Take 1 tablet (15 mg total) by mouth at bedtime.  30 tablet  3  . omeprazole (PRILOSEC) 40 MG capsule Take 1 capsule (40 mg total) by mouth daily before breakfast.  90 capsule  3  . DISCONTD: cyanocobalamin (,VITAMIN B-12,) 1000 MCG/ML injection Inject 1,000 mcg into the muscle every 30 (thirty) days.      Marland Kitchen DISCONTD: cyanocobalamin (,VITAMIN B-12,) 1000 MCG/ML injection Inject 1 mL (1,000 mcg total) into the muscle every 30 (thirty) days.  10 mL  2  . DISCONTD: diazepam (VALIUM) 5 MG tablet Take 1 tablet (5 mg  total) by mouth 2 (two) times daily as needed for anxiety. Take one half tablet at bedtime  60 tablet  3  . DISCONTD: diazepam (VALIUM) 5 MG tablet Take 1 tablet (5 mg total) by mouth 2 (two) times daily as needed for anxiety. Take one half tablet at bedtime  60 tablet  3  . DISCONTD: lisinopril (PRINIVIL,ZESTRIL) 40 MG tablet Take 1 tablet (40 mg total) by mouth daily.  90 tablet  5  . DISCONTD: lisinopril (PRINIVIL,ZESTRIL) 40 MG tablet Take 10 mg by mouth daily.      . ciprofloxacin (CIPRO) 500 MG tablet       . sulfamethoxazole-trimethoprim (BACTRIM DS) 800-160 MG per tablet       . DISCONTD: Cyanocobalamin (VITAMIN B-12 IJ) Inject as directed every 30 (thirty) days.           Orders Placed This Encounter  Procedures  . HM MAMMOGRAPHY  . CBC and differential  . Ambulatory referral to Physical Therapy  . HM COLONOSCOPY    No Follow-up on file.

## 2011-07-13 NOTE — Patient Instructions (Signed)
I am referring you for cardiopulmonary rehab at the Hospital  Continue taking just 10 mg of lisinopril  I agree with at least one boost or yogurt in between meals and always have a bedtime snack

## 2011-07-15 ENCOUNTER — Encounter: Payer: Self-pay | Admitting: Internal Medicine

## 2011-07-15 DIAGNOSIS — K259 Gastric ulcer, unspecified as acute or chronic, without hemorrhage or perforation: Secondary | ICD-10-CM | POA: Insufficient documentation

## 2011-07-15 DIAGNOSIS — D649 Anemia, unspecified: Secondary | ICD-10-CM | POA: Insufficient documentation

## 2011-07-15 DIAGNOSIS — I719 Aortic aneurysm of unspecified site, without rupture: Secondary | ICD-10-CM | POA: Insufficient documentation

## 2011-07-15 NOTE — Assessment & Plan Note (Signed)
She's had persistent anemia since 2012 when she had 2 admissions for bloody diarrhea. Her post hydration hemoglobin during this hospital admission was 9.9. She will need iron studies and iron supplementation if indicated.

## 2011-07-15 NOTE — Assessment & Plan Note (Addendum)
She had a sigmoid biopsy in 2008 and a normal a colonoscopy Nov 2012 following her last admission in September 2012 for diverticulitis with bloody diarrhea. Her colonoscopy was normal except for diverticulosis and internal hemorrhoids, and no future colonoscopies her planned per Dr. Mechele Collin.

## 2011-07-15 NOTE — Assessment & Plan Note (Signed)
Her blood pressure has been soft since her last admission and her lisinopril  dose was reduced to 10 mg daily. Will continue 10 mg daily for now.

## 2011-07-15 NOTE — Assessment & Plan Note (Signed)
She underwent open abdominal repair in July 2010. Blood pressure has been well-controlled since then. She does continue to smoke counseling given. She'll need annual follow up with Dr. Earnestine Leys

## 2011-08-08 ENCOUNTER — Telehealth: Payer: Self-pay | Admitting: Internal Medicine

## 2011-08-08 NOTE — Telephone Encounter (Signed)
I have initiated prior auth for Diazepam, waiting on the form to be filled out.

## 2011-08-10 NOTE — Telephone Encounter (Signed)
Prior authorization has been approved through insurance, patient and pharmacy notified.

## 2011-08-31 ENCOUNTER — Other Ambulatory Visit: Payer: Self-pay | Admitting: Internal Medicine

## 2011-08-31 MED ORDER — HYDROCODONE-ACETAMINOPHEN 5-500 MG PO TABS: 1.0000 | ORAL_TABLET | Freq: Four times a day (QID) | ORAL | Status: DC | PRN

## 2011-08-31 NOTE — Telephone Encounter (Signed)
Ok to refill 

## 2011-10-15 ENCOUNTER — Telehealth: Payer: Self-pay | Admitting: Internal Medicine

## 2011-10-15 NOTE — Telephone Encounter (Signed)
Caller: Kim/Child; PCP: Duncan Dull; CB#: [phone]; ; ; Call regarding Depression;  Pts daughter is calling to say that her Mom seems depressed. Pt has been weepy "cries over everything" x the past 2 months. The daughter states that she has told her Mom to talk to the MD about it but whenever the MD asks her she says she is fine. Pt has been prescribed Remeron for this but it seems that recently it is ineffective. Pt is in constant back pain that her pain meds are not handling either. Pts daughter states that the pt is argumentative and agitated but she has been that way for 1 year. Pt has a significant decline in her ability to make decisions. Rn triaged and came up with ED disposition. There are no appts today. Per protocol /RN sent office note in EPIC. OFFICE PLEASE CALL BACK AND ASK FOR KIM/DAUGHTER.

## 2011-10-15 NOTE — Telephone Encounter (Signed)
Tried calling daughter, but phone just rang and rang.

## 2011-10-16 NOTE — Telephone Encounter (Signed)
There is nothing that this patient needs to go to ED about,  These are chronci symptoms Make her an appt with me first available

## 2011-10-16 NOTE — Telephone Encounter (Signed)
Your pt

## 2011-10-16 NOTE — Telephone Encounter (Signed)
Patient has an appt with you on Friday

## 2011-10-19 ENCOUNTER — Ambulatory Visit (INDEPENDENT_AMBULATORY_CARE_PROVIDER_SITE_OTHER): Payer: Medicare Other | Admitting: Internal Medicine

## 2011-10-19 ENCOUNTER — Encounter: Payer: Self-pay | Admitting: Internal Medicine

## 2011-10-19 VITALS — BP 138/80 | HR 67 | Temp 98.0°F | Resp 14 | Wt 133.8 lb

## 2011-10-19 DIAGNOSIS — M545 Low back pain, unspecified: Secondary | ICD-10-CM

## 2011-10-19 DIAGNOSIS — F3289 Other specified depressive episodes: Secondary | ICD-10-CM

## 2011-10-19 DIAGNOSIS — F329 Major depressive disorder, single episode, unspecified: Secondary | ICD-10-CM

## 2011-10-19 MED ORDER — DULOXETINE HCL 20 MG PO CPEP: 20.0000 mg | ORAL_CAPSULE | Freq: Every day | ORAL | Status: DC

## 2011-10-19 MED ORDER — PREGABALIN 50 MG PO CAPS: 50.0000 mg | ORAL_CAPSULE | Freq: Three times a day (TID) | ORAL | Status: DC

## 2011-10-19 MED ORDER — HYDROCODONE-ACETAMINOPHEN 5-500 MG PO TABS: 1.0000 | ORAL_TABLET | Freq: Four times a day (QID) | ORAL | Status: DC | PRN

## 2011-10-19 NOTE — Progress Notes (Signed)
Patient ID: Anna Prince, female   DOB: March 03, 1932, 78 y.o.   MRN: [id number]  Patient Active Problem List  Diagnosis  . Diverticulosis, sigmoid  . Barrett's esophagus  . Hyperlipidemia  . Hypertension  . Lumbago  . Osteoporosis  . Depression  . Cognitive impairment  . Skin cancer of nose  . Unspecified vitamin deficiency  . Other and unspecified hyperlipidemia  . Screening for malignant neoplasm of breast  . Encounter for long-term (current) use of other medications  . Aortic aneurysm, abdominal  . Bursitis  . Screening for colon cancer  . COPD (chronic obstructive pulmonary disease)  . Aortic aneurysm  . Anemia  . GERD (gastroesophageal reflux disease)    Subjective:  CC:   Chief Complaint  Patient presents with  . Depression  . Pain    HPI:   Anna Hedrickis a 78 y.o. female who presents with constant pain in left hip  Accompanied by low back pain.  Treated with prior lumbar epidural  injections at [facility] and in hip by [facility] for bursitis but was told there was no joint problem so hip surgery not offered.  She is doing her exercises twice daily but her symptoms have not improved. By nightfall she can barely walk.  She is tearful and miserable bc of pain .  She has no trouble sleeping.  No prior trial of cymbalta or lyrica. She is using vicoind 2 to 3 times daily with incomplete relief. Prior cervical spine surgery 1992 for arachnoid cyst and 1997.  She has lumbar scoliosis and prior neurosurgical consult in [place]  over 1 year ago had no options bc of scar tissue . Prior MRI done in [place]  2nd issue is trouble affording  Themonthly expense of expense of spiriva and advair.     Past Medical History  Diagnosis Date  . Barrett's esophagus 2007  . Sigmoid diverticulosis     by colonoscopy  . Aortic aneurysm   . Macular degeneration   . Anxiety   . Hyperlipidemia   . Hypertension   . Osteoporosis   . Lumbago   . Depression   . Bursitis 2012    left hip, improved with  periodic steroid injection [facility] [place]  . Anemia 2012    of acute blood loss, resolved  . GERD (gastroesophageal reflux disease)     with Barretts Esophagus    Past Surgical History  Procedure Date  . Appendectomy   . Back surgery     X2..1975 arachnoid cyst cervical region(blumquist,gso);1997 lumbosacral tumor,9 hr surgery  . Abdominal aortic aneurysm repair July 2010    [facility]   The following portions of the patient's history were reviewed and updated as appropriate: Allergies, current medications, and problem list.  Review of Systems:  Comprehensive  review of systems was negative except those addressed in the HPI,     History   Social History  . Marital Status: Widowed    Spouse Name: N/A    Number of Children: N/A  . Years of Education: N/A   Occupational History  . Not on file.   Social History Main Topics  . Smoking status: Current Everyday Smoker    Types: Cigarettes  . Smokeless tobacco: Never Used   Comment: HAS BEEN SMOKING 30+ YEARS,RESUMED TOBACCO USE AFTER AAA REPAIR  . Alcohol Use: No  . Drug Use: No  . Sexually Active: Not on file   Other Topics Concern  . Not on file   Social History Narrative  .  No narrative on file    Objective:  BP 138/80  Pulse 67  Temp 98 F (36.7 C) (Oral)  Resp 14  Wt 133 lb 12 oz (60.669 kg)  SpO2 96%  General appearance: alert, cooperative and appears stated age Ears: normal TM's and external ear canals both ears Throat: lips, mucosa, and tongue normal; teeth and gums normal Neck: no adenopathy, no carotid bruit, supple, symmetrical, trachea midline and thyroid not enlarged, symmetric, no tenderness/mass/nodules Back: symmetric, no curvature. ROM normal. No CVA tenderness. Lungs: clear to auscultation bilaterally Heart: regular rate and rhythm, S1, S2 normal, no murmur, click, rub or gallop Abdomen: soft, non-tender; bowel sounds normal; no masses,  no organomegaly Pulses: 2+ and symmetric Skin: Skin  color, texture, turgor normal. No rashes or lesions Lymph nodes: Cervical, supraclavicular, and axillary nodes normal.  Assessment and Plan:  Lumbago Multifactorial, secondary to scoliosis and DDD , aggravated by hip pain.  Trial of lyrica 50 mg three times daily,  continueu vicodin for now.    Depression aggravated by chronic pain,  Adding cymbalta 20 mg daily.  Return in one month.    Updated Medication List Outpatient Encounter Prescriptions as of 10/19/2011  Medication Sig Dispense Refill  . cyanocobalamin (,VITAMIN B-12,) 1000 MCG/ML injection Inject 1 mL (1,000 mcg total) into the muscle every 30 (thirty) days.  10 mL  2  . diazepam (VALIUM) 5 MG tablet Take 1 tablet (5 mg total) by mouth 2 (two) times daily as needed for anxiety or sleep. Take one half tablet at bedtime  60 tablet  5  . HYDROcodone-acetaminophen (VICODIN) 5-500 MG per tablet Take 1 tablet by mouth every 6 (six) hours as needed for pain.  120 tablet  2  . lisinopril (PRINIVIL,ZESTRIL) 10 MG tablet Take 1 tablet (10 mg total) by mouth daily.  30 tablet  6  . lovastatin (MEVACOR) 40 MG tablet TAKE 1 TABLET EVERY DAY  90 tablet  2  . mirtazapine (REMERON) 15 MG tablet Take 1 tablet (15 mg total) by mouth at bedtime.  30 tablet  3  . omeprazole (PRILOSEC) 40 MG capsule Take 1 capsule (40 mg total) by mouth daily before breakfast.  90 capsule  3  . DISCONTD: HYDROcodone-acetaminophen (VICODIN) 5-500 MG per tablet Take 1 tablet by mouth every 6 (six) hours as needed for pain.  90 tablet  2  . DULoxetine (CYMBALTA) 20 MG capsule Take 1 capsule (20 mg total) by mouth daily.  30 capsule  11  . pregabalin (LYRICA) 50 MG capsule Take 1 capsule (50 mg total) by mouth 3 (three) times daily.  90 capsule  1  . DISCONTD: ciprofloxacin (CIPRO) 500 MG tablet       . DISCONTD: sulfamethoxazole-trimethoprim (BACTRIM DS) 800-160 MG per tablet          No orders of the defined types were placed in this encounter.    Return in about 1  month (around 11/18/2011).

## 2011-10-19 NOTE — Patient Instructions (Signed)
I am starting you on Lyrica for your back pain.  Start with one tablet daily in the evening,  Add a second in the morning after a few days,  And a third in the afternoon  After a few more days.

## 2011-10-21 NOTE — Assessment & Plan Note (Signed)
aggravated by chronic pain,  Adding cymbalta 20 mg daily.  Return in one month.

## 2011-10-21 NOTE — Assessment & Plan Note (Signed)
Multifactorial, secondary to scoliosis and DDD , aggravated by hip pain.  Trial of lyrica 50 mg three times daily,  continueu vicodin for now.

## 2011-11-21 ENCOUNTER — Ambulatory Visit (INDEPENDENT_AMBULATORY_CARE_PROVIDER_SITE_OTHER): Payer: Medicare Other | Admitting: Internal Medicine

## 2011-11-21 ENCOUNTER — Encounter: Payer: Self-pay | Admitting: Internal Medicine

## 2011-11-21 VITALS — BP 132/82 | HR 70 | Temp 98.3°F | Resp 16 | Wt 137.5 lb

## 2011-11-21 DIAGNOSIS — M545 Low back pain: Secondary | ICD-10-CM

## 2011-11-21 MED ORDER — PREGABALIN 50 MG PO CAPS: 50.0000 mg | ORAL_CAPSULE | Freq: Three times a day (TID) | ORAL | Status: DC

## 2011-11-21 MED ORDER — HYDROCODONE-ACETAMINOPHEN 5-500 MG PO TABS: 1.0000 | ORAL_TABLET | Freq: Four times a day (QID) | ORAL | Status: DC | PRN

## 2011-11-21 NOTE — Progress Notes (Signed)
Patient ID: Anna Prince, female   DOB: May 08, 1931, 78 y.o.   MRN: [id number] Patient Active Problem List  Diagnosis  . Diverticulosis, sigmoid  . Barrett's esophagus  . Hyperlipidemia  . Hypertension  . Lumbago  . Osteoporosis  . Depression  . Cognitive impairment  . Skin cancer of nose  . Unspecified vitamin deficiency  . Other and unspecified hyperlipidemia  . Screening for malignant neoplasm of breast  . Encounter for long-term (current) use of other medications  . Aortic aneurysm, abdominal  . Bursitis  . Screening for colon cancer  . COPD (chronic obstructive pulmonary disease)  . Anemia  . GERD (gastroesophageal reflux disease)    Subjective:  CC:   Chief Complaint  Patient presents with  . Follow-up    one month    HPI:      Anna Prince a 78 y.o. female who presents for one month follow up on back pain complicated by depression and fatigue.  At last visit I prescribed cymbalta and lyrica.  She did not try the cymbalta because of the cost.  She has tried the lyrica but three times daily dosing caused her to be sedated and irritable, so she has reduced it to twice daily .  Went to orthopedist Nettie Elm, MD in [facility], who has referred her to the Spinal clinic for options of treatment since the pain is not coming from the hip.  She feels generally better and is using the lyrica and prn vicodin.  Past Medical History  Diagnosis Date  . Barrett's esophagus 2007  . Sigmoid diverticulosis     by colonoscopy  . Aortic aneurysm   . Macular degeneration   . Anxiety   . Hyperlipidemia   . Hypertension   . Osteoporosis   . Lumbago   . Depression   . Bursitis 2012    left hip, improved with periodic steroid injection [facility] [place])  . Anemia 2012    of acute blood loss, resolved  . GERD (gastroesophageal reflux disease)     with Barretts Esophagus    Past Surgical History  Procedure Date  . Appendectomy   . Back surgery     X2..1975  arachnoid cyst cervical region(blumquist,gso);1997 lumbosacral tumor,9 hr surgery  . Abdominal aortic aneurysm repair July 2010    [facility]         The following portions of the patient's history were reviewed and updated as appropriate: Allergies, current medications, and problem list.    Review of Systems:  Back pain,, hip pain, The rest of a comprehensive  review of systems was negative except those addressed in the HPI,     History   Social History  . Marital Status: Widowed    Spouse Name: N/A    Number of Children: N/A  . Years of Education: N/A   Occupational History  . Not on file.   Social History Main Topics  . Smoking status: Current Everyday Smoker    Types: Cigarettes  . Smokeless tobacco: Never Used   Comment: HAS BEEN SMOKING 30+ YEARS,RESUMED TOBACCO USE AFTER AAA REPAIR  . Alcohol Use: No  . Drug Use: No  . Sexually Active: Not on file   Other Topics Concern  . Not on file   Social History Narrative  . No narrative on file    Objective:  BP 132/82  Pulse 70  Temp 98.3 F (36.8 C) (Oral)  Resp 16  Wt 137 lb 8 oz (62.37 kg)  SpO2 96%  General appearance: alert, cooperative and appears stated age Neck: no adenopathy, no carotid bruit, supple, symmetrical, trachea midline and thyroid not enlarged, symmetric, no tenderness/mass/nodules Back: scoliosis to the right, ROM restricted. No CVA tenderness. Lungs: clear to auscultation bilaterally Heart: regular rate and rhythm, S1, S2 normal, no murmur, click, rub or gallop Abdomen: soft, non-tender; bowel sounds normal; no masses,  no organomegaly Pulses: 2+ and symmetric Skin: Skin color, texture, turgor normal. No rashes or lesions Lymph nodes: Cervical, supraclavicular, and axillary nodes normal.  Assessment and Plan:  Lumbago Chronic, secondary to degenerative disk disease complicated by scoliosis, with L5 nerve root encroachment by CT/myelogram done in 2011.  Her pain is improved with trial  of lyrica.  Will continue twice daily dosing, prn vicodin. Awaiting neurosurgical evaluation.    Updated Medication List Outpatient Encounter Prescriptions as of 11/21/2011  Medication Sig Dispense Refill  . cyanocobalamin (,VITAMIN B-12,) 1000 MCG/ML injection Inject 1 mL (1,000 mcg total) into the muscle every 30 (thirty) days.  10 mL  2  . diazepam (VALIUM) 5 MG tablet Take 1 tablet (5 mg total) by mouth 2 (two) times daily as needed for anxiety or sleep. Take one half tablet at bedtime  60 tablet  5  . HYDROcodone-acetaminophen (VICODIN) 5-500 MG per tablet Take 1 tablet by mouth every 6 (six) hours as needed for pain.  120 tablet  3  . lisinopril (PRINIVIL,ZESTRIL) 10 MG tablet Take 1 tablet (10 mg total) by mouth daily.  30 tablet  6  . lovastatin (MEVACOR) 40 MG tablet TAKE 1 TABLET EVERY DAY  90 tablet  2  . methylPREDNISolone (MEDROL DOSEPAK) 4 MG tablet Take by mouth. follow package directions      . DISCONTD: HYDROcodone-acetaminophen (VICODIN) 5-500 MG per tablet Take 1 tablet by mouth every 6 (six) hours as needed for pain.  120 tablet  2  . DISCONTD: mirtazapine (REMERON) 15 MG tablet Take 1 tablet (15 mg total) by mouth at bedtime.  30 tablet  3  . DISCONTD: omeprazole (PRILOSEC) 40 MG capsule Take 1 capsule (40 mg total) by mouth daily before breakfast.  90 capsule  3  . DISCONTD: pregabalin (LYRICA) 50 MG capsule Take 1 capsule (50 mg total) by mouth 3 (three) times daily.  90 capsule  1  . DISCONTD: pregabalin (LYRICA) 50 MG capsule Take 1 capsule (50 mg total) by mouth 3 (three) times daily.  90 capsule  3  . DISCONTD: DULoxetine (CYMBALTA) 20 MG capsule Take 1 capsule (20 mg total) by mouth daily.  30 capsule  11     No orders of the defined types were placed in this encounter.    Return in about 3 months (around 02/21/2012).

## 2011-11-21 NOTE — Patient Instructions (Addendum)
CONTINue THE LYRICA TWICE DAILY, ALTERNATE WITH VICODIN FOR PAIN CONTROL  RETURN AT [facility] FOR Fasting labs

## 2011-11-23 ENCOUNTER — Other Ambulatory Visit: Payer: Self-pay | Admitting: Internal Medicine

## 2011-11-23 MED ORDER — PREGABALIN 50 MG PO CAPS: 50.0000 mg | ORAL_CAPSULE | Freq: Three times a day (TID) | ORAL | Status: DC

## 2011-11-24 NOTE — Assessment & Plan Note (Addendum)
Chronic, secondary to degenerative disk disease complicated by scoliosis, with L5 nerve root encroachment by CT/myelogram done in 2011.  Her pain is improved with trial of lyrica.  Will continue twice daily dosing, prn vicodin. Awaiting neurosurgical evaluation.

## 2011-11-25 ENCOUNTER — Other Ambulatory Visit: Payer: Self-pay | Admitting: Internal Medicine

## 2012-01-09 ENCOUNTER — Other Ambulatory Visit: Payer: Self-pay | Admitting: Internal Medicine

## 2012-01-09 MED ORDER — DIAZEPAM 5 MG PO TABS: 5.0000 mg | ORAL_TABLET | Freq: Two times a day (BID) | ORAL | Status: DC | PRN

## 2012-01-31 ENCOUNTER — Other Ambulatory Visit: Payer: Self-pay | Admitting: Internal Medicine

## 2012-02-01 ENCOUNTER — Other Ambulatory Visit: Payer: Self-pay

## 2012-02-18 ENCOUNTER — Other Ambulatory Visit (INDEPENDENT_AMBULATORY_CARE_PROVIDER_SITE_OTHER): Payer: Medicare Other

## 2012-02-18 DIAGNOSIS — D649 Anemia, unspecified: Secondary | ICD-10-CM

## 2012-02-18 DIAGNOSIS — E785 Hyperlipidemia, unspecified: Secondary | ICD-10-CM

## 2012-02-18 DIAGNOSIS — Z79899 Other long term (current) drug therapy: Secondary | ICD-10-CM

## 2012-02-18 LAB — LIPID PANEL
HDL: 61.1 mg/dL (ref >39.00)
Total CHOL/HDL Ratio: 3
Triglycerides: 82 mg/dL (ref 0.0–149.0)
VLDL: 16.4 mg/dL (ref 0.0–40.0)

## 2012-02-18 LAB — COMPLETE METABOLIC PANEL WITH GFR
ALT: <8 U/L (ref 0–35)
Alkaline Phosphatase: 76 U/L (ref 39–117)
CO2: 29 mEq/L (ref 19–32)
GFR, Est African American: 46 mL/min — ABNORMAL LOW
Potassium: 4.5 mEq/L (ref 3.5–5.3)
Sodium: 143 mEq/L (ref 135–145)
Total Bilirubin: 0.2 mg/dL — ABNORMAL LOW (ref 0.3–1.2)
Total Protein: 6.1 g/dL (ref 6.0–8.3)

## 2012-02-18 LAB — IRON AND TIBC: Iron: 31 ug/dL — ABNORMAL LOW (ref 42–145)

## 2012-02-18 LAB — VITAMIN B12: Vitamin B-12: 233 pg/mL (ref 211–911)

## 2012-02-19 MED ORDER — FERROUS FUM-IRON POLYSACCH-FA 162-115.2-1 MG PO CAPS: 1.0000 | ORAL_CAPSULE | Freq: Two times a day (BID) | ORAL | Status: DC

## 2012-02-19 NOTE — Addendum Note (Signed)
Addended by: Sherlene Shams on: 02/19/2012 06:25 PM   Modules accepted: Orders

## 2012-02-20 ENCOUNTER — Other Ambulatory Visit: Payer: Medicare Other

## 2012-02-20 DIAGNOSIS — Z79899 Other long term (current) drug therapy: Secondary | ICD-10-CM

## 2012-02-20 DIAGNOSIS — D649 Anemia, unspecified: Secondary | ICD-10-CM

## 2012-02-22 ENCOUNTER — Ambulatory Visit (INDEPENDENT_AMBULATORY_CARE_PROVIDER_SITE_OTHER): Payer: Medicare Other | Admitting: Internal Medicine

## 2012-02-22 ENCOUNTER — Encounter: Payer: Self-pay | Admitting: Internal Medicine

## 2012-02-22 VITALS — BP 142/80 | HR 67 | Temp 98.2°F | Resp 12 | Ht 63.5 in | Wt 137.8 lb

## 2012-02-22 DIAGNOSIS — D649 Anemia, unspecified: Secondary | ICD-10-CM

## 2012-02-22 DIAGNOSIS — M545 Low back pain, unspecified: Secondary | ICD-10-CM

## 2012-02-22 DIAGNOSIS — L989 Disorder of the skin and subcutaneous tissue, unspecified: Secondary | ICD-10-CM

## 2012-02-22 DIAGNOSIS — E538 Deficiency of other specified B group vitamins: Secondary | ICD-10-CM

## 2012-02-22 MED ORDER — CYANOCOBALAMIN 1000 MCG/ML IJ SOLN
1000.0000 ug | Freq: Once | INTRAMUSCULAR | Status: AC
  Administered 2012-02-22: 1000 ug via INTRAMUSCULAR

## 2012-02-22 MED ORDER — CYANOCOBALAMIN 1000 MCG/ML IJ SOLN: 1000.0000 ug | INTRAMUSCULAR | Status: DC

## 2012-02-22 NOTE — Assessment & Plan Note (Addendum)
Advised to walk daily to prevent progressive weakness.  Neurosurgery did not rec intervention but treated her with a Medrol dose pack.    Continue vicodin and lyrica

## 2012-02-22 NOTE — Patient Instructions (Addendum)
You need to resume b12 supplements,    \i am starting you on iron supplements twice daily (if yo can tolerate)  Return in 6 weeks for a repeat cbc

## 2012-02-24 ENCOUNTER — Encounter: Payer: Self-pay | Admitting: Internal Medicine

## 2012-02-24 NOTE — Assessment & Plan Note (Signed)
Multifactorial,  b12 and iron deficient/  Last colonoscopy was 2012 and no polyps found. Supplements advised.

## 2012-02-24 NOTE — Progress Notes (Signed)
Patient ID: Anna Prince, female   DOB: May 03, 1931, 78 y.o.   MRN: [id number]  Patient Active Problem List  Diagnosis  . Diverticulosis, sigmoid  . Barrett's esophagus  . Hyperlipidemia  . Hypertension  . Lumbago  . Osteoporosis  . Depression  . Cognitive impairment  . Skin cancer of nose  . Unspecified vitamin deficiency  . Other and unspecified hyperlipidemia  . Screening for malignant neoplasm of breast  . Encounter for long-term (current) use of other medications  . Aortic aneurysm, abdominal  . Bursitis  . Screening for colon cancer  . COPD (chronic obstructive pulmonary disease)  . Anemia  . GERD (gastroesophageal reflux disease)  . Skin lesion of face    Subjective:  CC:   Chief Complaint  Patient presents with  . Follow-up    HPI:   Anna Prince a 78 y.o. female who presents for follow up on back pain and anemia.  Has been seen by [facility] for hip and low back pain.  Hip injections were not helpful so they were discontinued .  Her pain is reasonable controlled on current regimen of hydrocodone and lyrica.  No surgery offered.  Her recent labs suggest continued b12 and iron deficiency;  Her daughter has not been giving her the B12  injections regularly. She reports fatigue and bilateral numbness of the lower extremities.    Past Medical History  Diagnosis Date  . Barrett's esophagus 2007  . Sigmoid diverticulosis     by colonoscopy  . Aortic aneurysm   . Macular degeneration   . Anxiety   . Hyperlipidemia   . Hypertension   . Osteoporosis   . Lumbago   . Depression   . Bursitis 2012    left hip, improved with periodic steroid injection [facility] [place])  . Anemia 2012    of acute blood loss, resolved  . GERD (gastroesophageal reflux disease)     with Barretts Esophagus    Past Surgical History  Procedure Date  . Appendectomy   . Back surgery     X2..1975 arachnoid cyst cervical region(blumquist,gso);1997 lumbosacral tumor,9 hr  surgery  . Abdominal aortic aneurysm repair July 2010    [facility]         The following portions of the patient's history were reviewed and updated as appropriate: Allergies, current medications, and problem list.    Review of Systems:   12 Pt  review of systems was negative except those addressed in the HPI,     History   Social History  . Marital Status: Widowed    Spouse Name: N/A    Number of Children: N/A  . Years of Education: N/A   Occupational History  . Not on file.   Social History Main Topics  . Smoking status: Current Every Day Smoker    Types: Cigarettes  . Smokeless tobacco: Never Used     Comment: HAS BEEN SMOKING 30+ YEARS,RESUMED TOBACCO USE AFTER AAA REPAIR  . Alcohol Use: No  . Drug Use: No  . Sexually Active: Not on file   Other Topics Concern  . Not on file   Social History Narrative  . No narrative on file    Objective:  BP 142/80  Pulse 67  Temp 98.2 F (36.8 C) (Oral)  Resp 12  Ht 5' 3.5" (1.613 m)  Wt 137 lb 12 oz (62.483 kg)  BMI 24.02 kg/m2  SpO2 90%  General appearance: alert, cooperative and appears stated age Ears: normal TM's and  external ear canals both ears Throat: lips, mucosa, and tongue normal; teeth and gums normal Neck: no adenopathy, no carotid bruit, supple, symmetrical, trachea midline and thyroid not enlarged, symmetric, no tenderness/mass/nodules Back: symmetric, no curvature. ROM normal. No CVA tenderness. Lungs: clear to auscultation bilaterally Heart: regular rate and rhythm, S1, S2 normal, no murmur, click, rub or gallop Abdomen: soft, non-tender; bowel sounds normal; no masses,  no organomegaly Pulses: 2+ and symmetric Skin: Skin color, texture, turgor normal. No rashes or lesions Lymph nodes: Cervical, supraclavicular, and axillary nodes normal.  Assessment and Plan:  Lumbago Advised to walk daily to prevent progressive weakness.  Neurosurgery did not rec intervention but treated her with a Medrol  dose pack.    Continue vicodin and lyrica  Anemia Multifactorial,  b12 and iron deficient/  Last colonoscopy was 2012 and no polyps found. Supplements advised.    Updated Medication List Outpatient Encounter Prescriptions as of 02/22/2012  Medication Sig Dispense Refill  . cyanocobalamin (,VITAMIN B-12,) 1000 MCG/ML injection Inject 1 mL (1,000 mcg total) into the muscle every 30 (thirty) days.  10 mL  2  . diazepam (VALIUM) 5 MG tablet Take 1 tablet (5 mg total) by mouth 2 (two) times daily as needed for anxiety or sleep.  60 tablet  5  . Ferrous Fum-Iron Polysacch-FA 162-115.2-1 MG CAPS Take 1 capsule by mouth 2 (two) times daily with a meal.  60 each  3  . HYDROcodone-acetaminophen (VICODIN) 5-500 MG per tablet TAKE 1 TABLET EVERY 6 HOURS AS NEEDED FOR PAIN  120 tablet  2  . lisinopril (PRINIVIL,ZESTRIL) 10 MG tablet Take 1 tablet (10 mg total) by mouth daily.  30 tablet  6  . lovastatin (MEVACOR) 40 MG tablet TAKE 1 TABLET EVERY DAY  90 tablet  2  . methylPREDNISolone (MEDROL DOSEPAK) 4 MG tablet Take by mouth. follow package directions      . mirtazapine (REMERON) 15 MG tablet TAKE 1 TABLET (15 MG TOTAL) BY MOUTH AT BEDTIME.  30 tablet  3  . omeprazole (PRILOSEC) 40 MG capsule TAKE 1 CAPSULE (40 MG TOTAL) BY MOUTH DAILY BEFORE BREAKFAST.  90 capsule  3  . pregabalin (LYRICA) 50 MG capsule Take 1 capsule (50 mg total) by mouth 3 (three) times daily.  90 capsule  3  . [DISCONTINUED] cyanocobalamin (,VITAMIN B-12,) 1000 MCG/ML injection Inject 1 mL (1,000 mcg total) into the muscle every 30 (thirty) days.  10 mL  2  . [EXPIRED] cyanocobalamin ((VITAMIN B-12)) injection 1,000 mcg          No orders of the defined types were placed in this encounter.    No Follow-up on file.      ,note

## 2012-03-11 ENCOUNTER — Telehealth: Payer: Self-pay | Admitting: Internal Medicine

## 2012-03-11 DIAGNOSIS — Z1239 Encounter for other screening for malignant neoplasm of breast: Secondary | ICD-10-CM

## 2012-03-11 NOTE — Telephone Encounter (Signed)
Authorized in epic

## 2012-03-11 NOTE — Telephone Encounter (Signed)
Pt left message wanting to get order for mammogram

## 2012-03-24 ENCOUNTER — Other Ambulatory Visit: Payer: Self-pay | Admitting: Internal Medicine

## 2012-03-25 ENCOUNTER — Other Ambulatory Visit: Payer: Self-pay

## 2012-03-25 NOTE — Telephone Encounter (Signed)
Refill request for Remeron 15 mg, ok to refill?

## 2012-03-28 ENCOUNTER — Other Ambulatory Visit: Payer: Self-pay | Admitting: *Deleted

## 2012-03-28 MED ORDER — MIRTAZAPINE 15 MG PO TABS: 15.0000 mg | ORAL_TABLET | Freq: Every day | ORAL | Status: DC

## 2012-04-03 ENCOUNTER — Telehealth: Payer: Self-pay | Admitting: Internal Medicine

## 2012-04-03 NOTE — Telephone Encounter (Signed)
error 

## 2012-04-04 ENCOUNTER — Other Ambulatory Visit: Payer: Medicare Other

## 2012-04-25 ENCOUNTER — Telehealth: Payer: Self-pay | Admitting: *Deleted

## 2012-04-25 ENCOUNTER — Telehealth: Payer: Self-pay | Admitting: Internal Medicine

## 2012-04-25 MED ORDER — HYDROCODONE-ACETAMINOPHEN 5-325 MG PO TABS: 1.0000 | ORAL_TABLET | Freq: Four times a day (QID) | ORAL | Status: DC | PRN

## 2012-04-25 NOTE — Telephone Encounter (Signed)
Ok to refill, with changes made  Fax printed rx to pharmacy once signed   

## 2012-04-25 NOTE — Addendum Note (Signed)
Addended by: Sherlene Shams on: 04/25/2012 01:29 PM   Modules accepted: Orders

## 2012-04-25 NOTE — Telephone Encounter (Signed)
Patient notified of Rx called in to pharmacy.

## 2012-04-29 ENCOUNTER — Telehealth: Payer: Self-pay | Admitting: *Deleted

## 2012-04-29 DIAGNOSIS — Z79899 Other long term (current) drug therapy: Secondary | ICD-10-CM

## 2012-04-29 DIAGNOSIS — E559 Vitamin D deficiency, unspecified: Secondary | ICD-10-CM

## 2012-04-29 DIAGNOSIS — D509 Iron deficiency anemia, unspecified: Secondary | ICD-10-CM

## 2012-04-29 DIAGNOSIS — E538 Deficiency of other specified B group vitamins: Secondary | ICD-10-CM

## 2012-04-29 NOTE — Telephone Encounter (Signed)
labs added 

## 2012-04-29 NOTE — Addendum Note (Signed)
Addended by: Sherlene Shams on: 04/29/2012 05:31 PM   Modules accepted: Orders

## 2012-04-29 NOTE — Telephone Encounter (Signed)
Pt is coming in for labs Tomorrow (01.08.2014) what labs and dx would you like? Thank you  

## 2012-04-30 ENCOUNTER — Other Ambulatory Visit (INDEPENDENT_AMBULATORY_CARE_PROVIDER_SITE_OTHER): Payer: Medicare Other

## 2012-04-30 ENCOUNTER — Other Ambulatory Visit: Payer: Medicare Other

## 2012-04-30 DIAGNOSIS — Z79899 Other long term (current) drug therapy: Secondary | ICD-10-CM

## 2012-04-30 DIAGNOSIS — E538 Deficiency of other specified B group vitamins: Secondary | ICD-10-CM

## 2012-04-30 DIAGNOSIS — D509 Iron deficiency anemia, unspecified: Secondary | ICD-10-CM

## 2012-04-30 DIAGNOSIS — E559 Vitamin D deficiency, unspecified: Secondary | ICD-10-CM

## 2012-04-30 LAB — COMPREHENSIVE METABOLIC PANEL
ALT: 13 U/L (ref 0–35)
AST: 19 U/L (ref 0–37)
Albumin: 3.5 g/dL (ref 3.5–5.2)
Alkaline Phosphatase: 73 U/L (ref 39–117)
BUN: 30 mg/dL — ABNORMAL HIGH (ref 6–23)
CO2: 26 mEq/L (ref 19–32)
Calcium: 9.4 mg/dL (ref 8.4–10.5)
Chloride: 110 mEq/L (ref 96–112)
Creatinine, Ser: 1.2 mg/dL (ref 0.4–1.2)
GFR: 48.47 mL/min — ABNORMAL LOW (ref >60.00)
Glucose, Bld: 68 mg/dL — ABNORMAL LOW (ref 70–99)
Potassium: 4.5 mEq/L (ref 3.5–5.1)
Sodium: 141 mEq/L (ref 135–145)
Total Bilirubin: 0.5 mg/dL (ref 0.3–1.2)
Total Protein: 6.6 g/dL (ref 6.0–8.3)

## 2012-04-30 LAB — IRON AND TIBC
%SAT: 15 % — ABNORMAL LOW (ref 20–55)
Iron: 48 ug/dL (ref 42–145)
UIBC: 277 ug/dL (ref 125–400)

## 2012-04-30 LAB — CBC WITH DIFFERENTIAL/PLATELET
Eosinophils Relative: 3.3 % (ref 0.0–5.0)
HCT: 34.9 % — ABNORMAL LOW (ref 36.0–46.0)
Lymphs Abs: 0.8 10*3/uL (ref 0.7–4.0)
Monocytes Relative: 6.9 % (ref 3.0–12.0)
Neutrophils Relative %: 71.7 % (ref 43.0–77.0)
Platelets: 172 10*3/uL (ref 150.0–400.0)
RBC: 3.89 Mil/uL (ref 3.87–5.11)
WBC: 4.6 10*3/uL (ref 4.5–10.5)

## 2012-04-30 LAB — VITAMIN B12: Vitamin B-12: 429 pg/mL (ref 211–911)

## 2012-04-30 LAB — FERRITIN: Ferritin: 28.6 ng/mL (ref 10.0–291.0)

## 2012-06-03 ENCOUNTER — Other Ambulatory Visit: Payer: Self-pay | Admitting: Internal Medicine

## 2012-06-04 NOTE — Telephone Encounter (Signed)
Pt last seen on 11/1. Med last filled on 8/2 #90 with 3 refills.

## 2012-06-05 MED ORDER — PREGABALIN 50 MG PO CAPS: ORAL_CAPSULE | ORAL | Status: DC

## 2012-06-05 NOTE — Addendum Note (Signed)
Addended by: Sherlene Shams on: 06/05/2012 04:59 PM   Modules accepted: Orders

## 2012-06-05 NOTE — Telephone Encounter (Signed)
Ok to call in

## 2012-06-06 ENCOUNTER — Other Ambulatory Visit: Payer: Self-pay | Admitting: Internal Medicine

## 2012-06-07 NOTE — Telephone Encounter (Signed)
Medicine called to [facility]. 

## 2012-06-11 ENCOUNTER — Other Ambulatory Visit: Payer: Self-pay | Admitting: General Practice

## 2012-06-11 ENCOUNTER — Telehealth: Payer: Self-pay | Admitting: Internal Medicine

## 2012-06-11 MED ORDER — LISINOPRIL 10 MG PO TABS: 10.0000 mg | ORAL_TABLET | Freq: Every day | ORAL | Status: DC

## 2012-06-11 NOTE — Telephone Encounter (Signed)
Called it in to pharmacy today.

## 2012-06-11 NOTE — Telephone Encounter (Signed)
Drug store has not received the refill on the patient's lisinopril. He daughter stated she needs it today.

## 2012-07-10 ENCOUNTER — Other Ambulatory Visit: Payer: Self-pay | Admitting: General Practice

## 2012-07-10 NOTE — Telephone Encounter (Signed)
Med last filled 01/09/12 and pt last seen on 02/22/12.

## 2012-07-11 MED ORDER — DIAZEPAM 5 MG PO TABS: 5.0000 mg | ORAL_TABLET | Freq: Two times a day (BID) | ORAL | Status: DC | PRN

## 2012-07-11 NOTE — Telephone Encounter (Signed)
Ok to refill,  printed rx  

## 2012-07-11 NOTE — Telephone Encounter (Signed)
Pt is currently out of her Valium. Please advise. She has called twice today in regards.

## 2012-07-11 NOTE — Telephone Encounter (Signed)
Called in on 3/21.

## 2012-07-23 ENCOUNTER — Other Ambulatory Visit: Payer: Self-pay | Admitting: Internal Medicine

## 2012-07-23 MED ORDER — MIRTAZAPINE 15 MG PO TABS: ORAL_TABLET | ORAL | Status: DC

## 2012-07-23 NOTE — Addendum Note (Signed)
Addended by: Sherlene Shams on: 07/23/2012 12:25 PM   Modules accepted: Orders

## 2012-07-23 NOTE — Telephone Encounter (Signed)
Ok to refill,  Refill sent  

## 2012-08-21 ENCOUNTER — Ambulatory Visit (INDEPENDENT_AMBULATORY_CARE_PROVIDER_SITE_OTHER): Payer: Medicare Other | Admitting: Adult Health

## 2012-08-21 ENCOUNTER — Encounter: Payer: Self-pay | Admitting: Adult Health

## 2012-08-21 VITALS — BP 100/62 | HR 98 | Temp 98.6°F | Resp 14 | Wt 124.0 lb

## 2012-08-21 DIAGNOSIS — J441 Chronic obstructive pulmonary disease with (acute) exacerbation: Secondary | ICD-10-CM | POA: Insufficient documentation

## 2012-08-21 MED ORDER — LEVOFLOXACIN 250 MG PO TABS: ORAL_TABLET | ORAL | Status: DC

## 2012-08-21 MED ORDER — ALBUTEROL SULFATE HFA 108 (90 BASE) MCG/ACT IN AERS: 2.0000 | INHALATION_SPRAY | Freq: Four times a day (QID) | RESPIRATORY_TRACT | Status: DC | PRN

## 2012-08-21 NOTE — Progress Notes (Signed)
  Subjective:    Patient ID: Anna Prince, female    DOB: May 28, 1931, 79 y.o.   MRN: [id number]  HPI  Patient is a pleasant 79 year old female with multiple medical problems including hypertension, hyperlipidemia, COPD, AAA who presents to clinic with c/o cough and shortness of breath. Symptoms began approximately 2 weeks ago around when the pollen started. She has had chills and low-grade temp. She has not taken any over-the-counter medications.  Current Outpatient Prescriptions on File Prior to Visit  Medication Sig Dispense Refill  . cyanocobalamin (,VITAMIN B-12,) 1000 MCG/ML injection Inject 1 mL (1,000 mcg total) into the muscle every 30 (thirty) days.  10 mL  2  . diazepam (VALIUM) 5 MG tablet Take 1 tablet (5 mg total) by mouth 2 (two) times daily as needed for anxiety or sleep.  60 tablet  5  . Ferrous Fum-Iron Polysacch-FA 162-115.2-1 MG CAPS Take 1 capsule by mouth 2 (two) times daily with a meal.  60 each  3  . HYDROcodone-acetaminophen (NORCO/VICODIN) 5-325 MG per tablet Take 1 tablet by mouth every 6 (six) hours as needed for pain.  120 tablet  5  . lisinopril (PRINIVIL,ZESTRIL) 10 MG tablet Take 1 tablet (10 mg total) by mouth daily.  30 tablet  6  . lovastatin (MEVACOR) 40 MG tablet TAKE 1 TABLET EVERY DAY  90 tablet  2  . LYRICA 50 MG capsule TAKE ONE CAPSULE BY MOUTH 3 TIMES A DAY  90 capsule  3  . mirtazapine (REMERON) 15 MG tablet TAKE 1 TABLET (15 MG TOTAL) BY MOUTH AT BEDTIME.  30 tablet  3  . omeprazole (PRILOSEC) 40 MG capsule TAKE 1 CAPSULE (40 MG TOTAL) BY MOUTH DAILY BEFORE BREAKFAST.  90 capsule  3   No current facility-administered medications on file prior to visit.     Review of Systems  Constitutional: Positive for fever.  HENT: Positive for congestion, postnasal drip and sinus pressure.   Respiratory: Positive for cough and shortness of breath.    BP 100/62  Pulse 98  Temp(Src) 98.6 F (37 C) (Oral)  Resp 14  Wt 124 lb (56.246 kg)  BMI 21.62 kg/m2   SpO2 96%    Objective:   Physical Exam  Constitutional: She is oriented to person, place, and time.  Frail, elderly female. NAD  HENT:  Head: Normocephalic and atraumatic.  Right Ear: External ear normal.  Left Ear: External ear normal.  Pharyngeal erythema  Cardiovascular: Normal rate, regular rhythm and normal heart sounds.  Exam reveals no gallop.   No murmur heard. Pulmonary/Chest: Effort normal and breath sounds normal. No respiratory distress. She has no wheezes. She has no rales.  Congested cough observed  Neurological: She is alert and oriented to person, place, and time.  Skin: Skin is warm and dry.  Psychiatric: She has a normal mood and affect. Her behavior is normal. Judgment and thought content normal.      Assessment & Plan:

## 2012-08-21 NOTE — Assessment & Plan Note (Signed)
Start Levaquin 500 mg x1 day then 250 mg daily x9 days. Patient dosed for creatinine clearance of 34.31. Use Ventolin inhaler every 6 hours as needed for wheezing or shortness of breath. RTC if symptoms not improved by Monday.

## 2012-08-21 NOTE — Patient Instructions (Addendum)
  Start your levaquin tonight. You will take 2 tablets tonight (500 mg) then take 1 tablet (250 mg) nightly for the next 9 days.  Use your inhaler every 6 hours as needed for shortness of breath or wheezing.

## 2012-09-21 ENCOUNTER — Other Ambulatory Visit: Payer: Self-pay | Admitting: Internal Medicine

## 2012-10-24 ENCOUNTER — Other Ambulatory Visit: Payer: Self-pay | Admitting: Internal Medicine

## 2012-10-27 NOTE — Telephone Encounter (Signed)
Okay to refill? 

## 2012-11-21 ENCOUNTER — Other Ambulatory Visit: Payer: Self-pay | Admitting: Internal Medicine

## 2012-11-21 NOTE — Telephone Encounter (Signed)
Visit with Raquel for COPD but has not had OV since 2013 no labs since 1/13

## 2012-11-22 NOTE — Telephone Encounter (Signed)
Refill one 30 days only.  Has not been seen in 6 months so she needs an OV

## 2012-12-07 ENCOUNTER — Other Ambulatory Visit: Payer: Self-pay | Admitting: Internal Medicine

## 2012-12-08 ENCOUNTER — Other Ambulatory Visit: Payer: Self-pay | Admitting: *Deleted

## 2012-12-09 MED ORDER — PREGABALIN 50 MG PO CAPS: ORAL_CAPSULE | ORAL | Status: DC

## 2012-12-09 NOTE — Telephone Encounter (Signed)
Rx faxed to pharmacy  

## 2012-12-09 NOTE — Telephone Encounter (Signed)
Pt made appointment for 12/16/12. Ok to refill?

## 2012-12-15 ENCOUNTER — Encounter: Payer: Self-pay | Admitting: *Deleted

## 2012-12-16 ENCOUNTER — Encounter: Payer: Self-pay | Admitting: Internal Medicine

## 2012-12-16 ENCOUNTER — Ambulatory Visit (INDEPENDENT_AMBULATORY_CARE_PROVIDER_SITE_OTHER): Payer: Medicare Other | Admitting: Internal Medicine

## 2012-12-16 VITALS — BP 162/88 | HR 66 | Temp 98.1°F | Resp 16 | Wt 129.5 lb

## 2012-12-16 DIAGNOSIS — M545 Low back pain, unspecified: Secondary | ICD-10-CM

## 2012-12-16 DIAGNOSIS — E785 Hyperlipidemia, unspecified: Secondary | ICD-10-CM

## 2012-12-16 DIAGNOSIS — F3289 Other specified depressive episodes: Secondary | ICD-10-CM

## 2012-12-16 DIAGNOSIS — E538 Deficiency of other specified B group vitamins: Secondary | ICD-10-CM

## 2012-12-16 DIAGNOSIS — Z1239 Encounter for other screening for malignant neoplasm of breast: Secondary | ICD-10-CM

## 2012-12-16 DIAGNOSIS — F329 Major depressive disorder, single episode, unspecified: Secondary | ICD-10-CM

## 2012-12-16 DIAGNOSIS — I10 Essential (primary) hypertension: Secondary | ICD-10-CM

## 2012-12-16 DIAGNOSIS — D649 Anemia, unspecified: Secondary | ICD-10-CM

## 2012-12-16 DIAGNOSIS — E559 Vitamin D deficiency, unspecified: Secondary | ICD-10-CM

## 2012-12-16 DIAGNOSIS — E039 Hypothyroidism, unspecified: Secondary | ICD-10-CM

## 2012-12-16 LAB — CBC WITH DIFFERENTIAL/PLATELET
Basophils Absolute: 0 10*3/uL (ref 0.0–0.1)
Eosinophils Relative: 1.6 % (ref 0.0–5.0)
Lymphocytes Relative: 20.8 % (ref 12.0–46.0)
Monocytes Relative: 4.4 % (ref 3.0–12.0)
Platelets: 154 10*3/uL (ref 150.0–400.0)
RDW: 14.7 % — ABNORMAL HIGH (ref 11.5–14.6)
WBC: 6.3 10*3/uL (ref 4.5–10.5)

## 2012-12-16 LAB — LDL CHOLESTEROL, DIRECT: Direct LDL: 105 mg/dL

## 2012-12-16 LAB — TSH: TSH: 0.8 u[IU]/mL (ref 0.35–5.50)

## 2012-12-16 LAB — FERRITIN: Ferritin: 28.4 ng/mL (ref 10.0–291.0)

## 2012-12-16 MED ORDER — MIRTAZAPINE 30 MG PO TABS: ORAL_TABLET | ORAL | Status: DC

## 2012-12-16 MED ORDER — SYRINGE (DISPOSABLE) 1 ML MISC: Status: DC

## 2012-12-16 MED ORDER — TETANUS-DIPHTH-ACELL PERTUSSIS 5-2.5-18.5 LF-MCG/0.5 IM SUSP: 0.5000 mL | Freq: Once | INTRAMUSCULAR | Status: DC

## 2012-12-16 NOTE — Patient Instructions (Addendum)
You are due for a mammogram   Get your TDaP at the health department  As soon as possible   Mammogram ordered  Resume B12 injections   Follow up 6 months

## 2012-12-16 NOTE — Assessment & Plan Note (Signed)
Managed with Vicodin and Lyrica. She is not candidate for surgery given her history of aortic aneurysm COPD.

## 2012-12-16 NOTE — Assessment & Plan Note (Signed)
Well controlled on current regimen. Renal function due.,   no changes today. 

## 2012-12-16 NOTE — Progress Notes (Signed)
Patient ID: Anna Prince, female   DOB: 01/09/1932, 79 y.o.   MRN: [id number]  Patient Active Problem List   Diagnosis Date Noted  . COPD exacerbation 08/21/2012  . Skin lesion of face 02/22/2012  . Anemia   . GERD (gastroesophageal reflux disease)   . Screening for colon cancer 06/18/2011  . COPD (chronic obstructive pulmonary disease) 06/18/2011  . Bursitis   . Skin cancer of nose 12/17/2010  . Unspecified vitamin deficiency 12/17/2010  . Other and unspecified hyperlipidemia 12/17/2010  . Screening for malignant neoplasm of breast 12/17/2010  . Encounter for long-term (current) use of other medications 12/17/2010  . Aortic aneurysm, abdominal 12/17/2010  . Diverticulosis, sigmoid 12/08/2010  . Barrett's esophagus 12/08/2010  . Hyperlipidemia 12/08/2010  . Hypertension 12/08/2010  . Lumbago 12/08/2010  . Osteoporosis 12/08/2010  . Depression 12/08/2010  . Cognitive impairment 12/08/2010    Subjective:  CC:   Chief Complaint  Patient presents with  . Follow-up    HPI:   Anna Hedrickis a 79 y.o. female who presents  For Followup on chronic conditions. She continues to lose weight secondary to decreased him in appetite. Her daughter has been trying to cut different things for her but she often is not hungry and skips meals.  Taking sublingual b12  Ran out of syringes for IM 2 months ago.    Back pain is constant  .  Managed with 2 hydrocdone daily and lyrica, and narcotics cause occasional constipation  Tobacco abuse she continues to smoke and has not tried to cut back serially. She smokes a half pack daily .   she does not have daily symptoms of dyspnea but does use an albuterol inhaler occasionally. She has no claudication symptoms. She's not interested in going smoking.    1 refill on diazepam .    Past Medical History  Diagnosis Date  . Barrett's esophagus 2007  . Sigmoid diverticulosis     by colonoscopy  . Aortic aneurysm   . Macular degeneration   .  Anxiety   . Hyperlipidemia   . Hypertension   . Osteoporosis   . Lumbago   . Depression   . Bursitis 2012    left hip, improved with periodic steroid injection [facility] [place])  . Anemia 2012    of acute blood loss, resolved  . GERD (gastroesophageal reflux disease)     with Barretts Esophagus    Past Surgical History  Procedure Laterality Date  . Appendectomy    . Back surgery      X2..1975 arachnoid cyst cervical region(blumquist,[place]);1997 lumbosacral tumor,9 hr surgery  . Abdominal aortic aneurysm repair  July 2010    [facility]       The following portions of the patient's history were reviewed and updated as appropriate: Allergies, current medications, and problem list.    Review of Systems:   12 Pt  review of systems was negative except those addressed in the HPI,     History   Social History  . Marital Status: Widowed    Spouse Name: N/A    Number of Children: N/A  . Years of Education: N/A   Occupational History  . Not on file.   Social History Main Topics  . Smoking status: Current Every Day Smoker    Types: Cigarettes  . Smokeless tobacco: Never Used     Comment: HAS BEEN SMOKING 30+ YEARS,RESUMED TOBACCO USE AFTER AAA REPAIR  . Alcohol Use: No  . Drug Use: No  . Sexual  Activity: Not on file   Other Topics Concern  . Not on file   Social History Narrative  . No narrative on file    Objective:  Filed Vitals:   12/16/12 1024  BP: 162/88  Pulse: 66  Temp: 98.1 F (36.7 C)  Resp: 16     General appearance: alert, cooperative and appears stated age Ears: normal TM's and external ear canals both ears Throat: lips, mucosa, and tongue normal; teeth and gums normal Neck: no adenopathy, no carotid bruit, supple, symmetrical, trachea midline and thyroid not enlarged, symmetric, no tenderness/mass/nodules Back: symmetric, no curvature. ROM normal. No CVA tenderness. Lungs: clear to auscultation bilaterally Heart: regular rate and rhythm, S1,  S2 normal, no murmur, click, rub or gallop Abdomen: soft, non-tender; bowel sounds normal; no masses,  no organomegaly Pulses: 2+ and symmetric Skin: Skin color, texture, turgor normal. No rashes or lesions Lymph nodes: Cervical, supraclavicular, and axillary nodes normal.  Assessment and Plan:  Depression With anhedonia and loss of appetite. We will increase her mirtazapine 30 mg. Suggested to daughter that she try supplementing diet with boost  Hypertension Well controlled on current regimen. Renal function due, no changes today.  Lumbago Managed with Vicodin and Lyrica. She is not candidate for surgery given her history of aortic aneurysm COPD.   Updated Medication List Outpatient Encounter Prescriptions as of 12/16/2012  Medication Sig Dispense Refill  . albuterol (PROVENTIL HFA;VENTOLIN HFA) 108 (90 BASE) MCG/ACT inhaler Inhale 2 puffs into the lungs every 6 (six) hours as needed for wheezing.  8.5 g  0  . Cyanocobalamin (B-12-SL) 1000 MCG SUBL Place under the tongue daily.      . diazepam (VALIUM) 5 MG tablet Take 1 tablet (5 mg total) by mouth 2 (two) times daily as needed for anxiety or sleep.  60 tablet  5  . HYDROcodone-acetaminophen (NORCO/VICODIN) 5-325 MG per tablet TAKE 1 TABLET BY MOUTH EVERY 6 HOURS  120 tablet  4  . lisinopril (PRINIVIL,ZESTRIL) 10 MG tablet Take 1 tablet (10 mg total) by mouth daily.  30 tablet  6  . lovastatin (MEVACOR) 40 MG tablet TAKE 1 TABLET EVERY DAY  90 tablet  2  . mirtazapine (REMERON) 30 MG tablet TAKE 1 TABLET (15 MG TOTAL) BY MOUTH AT BEDTIME.  30 tablet  3  . omeprazole (PRILOSEC) 40 MG capsule TAKE 1 CAPSULE (40 MG TOTAL) BY MOUTH DAILY BEFORE BREAKFAST.  90 capsule  3  . pregabalin (LYRICA) 50 MG capsule TAKE ONE CAPSULE BY MOUTH 3 TIMES A DAY  90 capsule  0  . [DISCONTINUED] mirtazapine (REMERON) 15 MG tablet TAKE 1 TABLET (15 MG TOTAL) BY MOUTH AT BEDTIME.  30 tablet  3  . [DISCONTINUED] mirtazapine (REMERON) 15 MG tablet TAKE 1  TABLET BY MOUTH AT BEDTIME  30 tablet  3  . cyanocobalamin (,VITAMIN B-12,) 1000 MCG/ML injection Inject 1 mL (1,000 mcg total) into the muscle every 30 (thirty) days.  10 mL  2  . Syringe, Disposable, 1 ML MISC for b12 injections,  Monthly  25 each  0  . TDaP (BOOSTRIX) 5-2.5-18.5 LF-MCG/0.5 injection Inject 0.5 mLs into the muscle once.  0.5 mL  0  . [DISCONTINUED] Ferrous Fum-Iron Polysacch-FA 162-115.2-1 MG CAPS Take 1 capsule by mouth 2 (two) times daily with a meal.  60 each  3  . [DISCONTINUED] levofloxacin (LEVAQUIN) 250 MG tablet Take 2 tablets tonight, then take 1 tablet nightly for the next 9 days.  11 tablet  0  No facility-administered encounter medications on file as of 12/16/2012.     Orders Placed This Encounter  Procedures  . MM Digital Screening  . LDL cholesterol, direct  . CBC with Differential  . TSH  . Vit D  25 hydroxy (rtn osteoporosis monitoring)  . Vitamin B12  . Iron and TIBC  . Ferritin    No Follow-up on file.

## 2012-12-16 NOTE — Assessment & Plan Note (Signed)
With anhedonia and loss of appetite. We will increase her mirtazapine 30 mg. Suggested to daughter that she try supplementing diet with boost

## 2012-12-17 LAB — IRON AND TIBC
%SAT: 34 % (ref 20–55)
Iron: 115 ug/dL (ref 42–145)
UIBC: 222 ug/dL (ref 125–400)

## 2012-12-17 LAB — VITAMIN D 25 HYDROXY (VIT D DEFICIENCY, FRACTURES): Vit D, 25-Hydroxy: 27 ng/mL — ABNORMAL LOW (ref 30–89)

## 2012-12-18 ENCOUNTER — Encounter: Payer: Self-pay | Admitting: *Deleted

## 2012-12-25 ENCOUNTER — Ambulatory Visit: Payer: Self-pay | Admitting: Internal Medicine

## 2012-12-31 ENCOUNTER — Other Ambulatory Visit: Payer: Self-pay | Admitting: Internal Medicine

## 2012-12-31 MED ORDER — LISINOPRIL 10 MG PO TABS: 10.0000 mg | ORAL_TABLET | Freq: Every day | ORAL | Status: DC

## 2012-12-31 NOTE — Telephone Encounter (Signed)
Lisinopril.  States pharmacy told her they had not received a response from [place] for refill request.  Pharmacy [facility].

## 2012-12-31 NOTE — Telephone Encounter (Signed)
Refill sent.

## 2013-01-06 ENCOUNTER — Telehealth: Payer: Self-pay | Admitting: Internal Medicine

## 2013-01-06 MED ORDER — LISINOPRIL 10 MG PO TABS: 10.0000 mg | ORAL_TABLET | Freq: Every day | ORAL | Status: DC

## 2013-01-06 NOTE — Telephone Encounter (Signed)
Repeat lisinopril [facility] please confirm she received it

## 2013-01-06 NOTE — Telephone Encounter (Signed)
States [facility] says they did not receive response for Lisinopril refill.  Advised Ms. Brown per our system this was phoned [facility] on 12/31/12.  [facility] but asks that we send this again.  Pt has been w/o medication.

## 2013-01-06 NOTE — Telephone Encounter (Signed)
Recent script electronically to pharmacy today.

## 2013-01-06 NOTE — Addendum Note (Signed)
Addended by: Sherlene Shams on: 01/06/2013 11:43 AM   Modules accepted: Orders

## 2013-01-07 ENCOUNTER — Encounter: Payer: Self-pay | Admitting: Internal Medicine

## 2013-01-08 ENCOUNTER — Encounter: Payer: Self-pay | Admitting: Internal Medicine

## 2013-01-14 LAB — HM MAMMOGRAPHY: HM Mammogram: NORMAL

## 2013-01-19 ENCOUNTER — Other Ambulatory Visit: Payer: Self-pay | Admitting: Internal Medicine

## 2013-01-20 ENCOUNTER — Other Ambulatory Visit: Payer: Self-pay | Admitting: *Deleted

## 2013-01-20 NOTE — Telephone Encounter (Signed)
Last OV 12/16/12, ok to refill?

## 2013-01-21 MED ORDER — DIAZEPAM 5 MG PO TABS: 5.0000 mg | ORAL_TABLET | Freq: Two times a day (BID) | ORAL | Status: DC | PRN

## 2013-01-22 ENCOUNTER — Telehealth: Payer: Self-pay | Admitting: *Deleted

## 2013-01-22 ENCOUNTER — Other Ambulatory Visit: Payer: Self-pay | Admitting: Internal Medicine

## 2013-01-22 MED ORDER — PREGABALIN 75 MG PO CAPS: ORAL_CAPSULE | ORAL | Status: DC

## 2013-01-22 NOTE — Telephone Encounter (Signed)
Rx's faxed to pharmacy

## 2013-01-22 NOTE — Telephone Encounter (Signed)
Rxs faxed to pharmacy. Pt notified.

## 2013-01-22 NOTE — Telephone Encounter (Signed)
Pt called checking on her rx for lyrica and diazepam  Pt stated she called her drug store [facility] Pt needs rx today  Pt is complete out of meds

## 2013-01-22 NOTE — Telephone Encounter (Signed)
Two scripts faxed

## 2013-02-16 ENCOUNTER — Other Ambulatory Visit: Payer: Self-pay | Admitting: Internal Medicine

## 2013-02-16 ENCOUNTER — Telehealth: Payer: Self-pay | Admitting: Internal Medicine

## 2013-02-16 MED ORDER — HYDROCODONE-ACETAMINOPHEN 5-325 MG PO TABS: ORAL_TABLET | ORAL | Status: DC

## 2013-02-16 NOTE — Telephone Encounter (Signed)
Last OV 8/14 please advise.

## 2013-02-16 NOTE — Telephone Encounter (Signed)
HYDROcodone-acetaminophen (NORCO/VICODIN) 5-325 MG per tablet  Patient needs this prescription by tomorrow .

## 2013-02-17 NOTE — Telephone Encounter (Signed)
Left message for patient to call office.  

## 2013-02-18 NOTE — Telephone Encounter (Signed)
Returned call.  Advised Rx ready for pickup.  Advised pt must be present for pickup.

## 2013-03-10 ENCOUNTER — Telehealth: Payer: Self-pay | Admitting: Internal Medicine

## 2013-03-10 NOTE — Telephone Encounter (Signed)
We can try changing her to  paxil,  And she will need a follow up appt in mid december .  She will need to taper off the lyurica,  How much does she have left bc we have samples if she needs them

## 2013-03-10 NOTE — Telephone Encounter (Signed)
Patient was prescribed Lyrica for depression stated by patient please advise if something less expensive can be substituted?

## 2013-03-10 NOTE — Telephone Encounter (Signed)
Pt cannot afford her Lyrica.  Is asking for something that will work just as well but not cost as much.  May call cell [phone]  (pt states cell listed is her daughter) if not available at home number.  May leave msg on either #.

## 2013-03-12 NOTE — Telephone Encounter (Signed)
Patient scheduled for 04/08/13 sample placed up front for pick up. Also placed DPR on bag to be signed

## 2013-03-16 ENCOUNTER — Telehealth: Payer: Self-pay | Admitting: Internal Medicine

## 2013-03-16 NOTE — Telephone Encounter (Signed)
error 

## 2013-04-08 ENCOUNTER — Ambulatory Visit (INDEPENDENT_AMBULATORY_CARE_PROVIDER_SITE_OTHER): Payer: Medicare Other | Admitting: Internal Medicine

## 2013-04-08 ENCOUNTER — Encounter (INDEPENDENT_AMBULATORY_CARE_PROVIDER_SITE_OTHER): Payer: Self-pay

## 2013-04-08 VITALS — BP 126/70 | HR 72 | Temp 97.8°F | Wt 131.0 lb

## 2013-04-08 DIAGNOSIS — J449 Chronic obstructive pulmonary disease, unspecified: Secondary | ICD-10-CM

## 2013-04-08 DIAGNOSIS — Z7189 Other specified counseling: Secondary | ICD-10-CM

## 2013-04-08 DIAGNOSIS — E559 Vitamin D deficiency, unspecified: Secondary | ICD-10-CM

## 2013-04-08 DIAGNOSIS — F329 Major depressive disorder, single episode, unspecified: Secondary | ICD-10-CM

## 2013-04-08 DIAGNOSIS — F17200 Nicotine dependence, unspecified, uncomplicated: Secondary | ICD-10-CM

## 2013-04-08 DIAGNOSIS — G894 Chronic pain syndrome: Secondary | ICD-10-CM

## 2013-04-08 DIAGNOSIS — Z716 Tobacco abuse counseling: Secondary | ICD-10-CM

## 2013-04-08 MED ORDER — CYANOCOBALAMIN 1000 MCG/ML IJ SOLN: 1000.0000 ug | INTRAMUSCULAR | Status: DC

## 2013-04-08 MED ORDER — PAROXETINE HCL 10 MG PO TABS: 10.0000 mg | ORAL_TABLET | Freq: Every day | ORAL | Status: DC

## 2013-04-08 MED ORDER — HYDROCODONE-ACETAMINOPHEN 5-325 MG PO TABS: ORAL_TABLET | ORAL | Status: DC

## 2013-04-08 MED ORDER — SYRINGE (DISPOSABLE) 1 ML MISC: Status: DC

## 2013-04-08 NOTE — Progress Notes (Signed)
Pre visit review using our clinic review tool, if applicable. No additional management support is needed unless otherwise documented below in the visit note. 

## 2013-04-08 NOTE — Patient Instructions (Signed)
We are starting paxil for depression and anxeity.  Start with 1/2 tablet daily in the evening  Increase to a full tablet after a few days if you are not nauseated by it.  You can take it with food.   You can reduce your lyrica to one tablet every other day for a week then stop.   If you have enough for two weeks,  You can start the taper AFTER the first week of paxil   Return in one month for dose titration    You need to take 2000 units of Vitamin D 3 daily for your bones,  And 1200 mg calcium daily through diet or supplements

## 2013-04-10 ENCOUNTER — Encounter: Payer: Self-pay | Admitting: Internal Medicine

## 2013-04-10 DIAGNOSIS — Z716 Tobacco abuse counseling: Secondary | ICD-10-CM | POA: Insufficient documentation

## 2013-04-10 NOTE — Assessment & Plan Note (Signed)
Risks of continued tobacco use were discussed. She is not currently interested in tobacco cessation.    

## 2013-04-10 NOTE — Assessment & Plan Note (Addendum)
Complicated by anxiety and chronic pain. The Lyrica has made her too tired and is Coumadin and on the whole or in month. We discussed tapering her off of Lyrica in starting Paxil for depression. Continue mirtazapine at bedtime for insomnia and appetite

## 2013-04-10 NOTE — Assessment & Plan Note (Signed)
Secondary to lifelong tobacco use. Lungs are clear today and she is using her inhalers as directed.

## 2013-04-10 NOTE — Assessment & Plan Note (Signed)
Advised to continue to thousand units of vitamin D daily for persistently  subnormal levels

## 2013-04-10 NOTE — Progress Notes (Signed)
Patient ID: Anna Prince, female   DOB: [birth year], 79 y.o.   MRN: [id number]   Patient Active Problem List   Diagnosis Date Noted  . Tobacco abuse counseling 04/10/2013  . COPD exacerbation 08/21/2012  . Skin lesion of face 02/22/2012  . Anemia   . GERD (gastroesophageal reflux disease)   . Screening for colon cancer 06/18/2011  . COPD (chronic obstructive pulmonary disease) 06/18/2011  . Bursitis   . Skin cancer of nose 12/17/2010  . Unspecified vitamin D deficiency 12/17/2010  . Other and unspecified hyperlipidemia 12/17/2010  . Screening for malignant neoplasm of breast 12/17/2010  . Encounter for long-term (current) use of other medications 12/17/2010  . Aortic aneurysm, abdominal 12/17/2010  . Diverticulosis, sigmoid 12/08/2010  . Barrett's esophagus 12/08/2010  . Hyperlipidemia 12/08/2010  . Hypertension 12/08/2010  . Lumbago 12/08/2010  . Osteoporosis 12/08/2010  . Depression 12/08/2010  . Cognitive impairment 12/08/2010    Subjective:  CC:   Chief Complaint  Patient presents with  . Medication Problem    HPI:   Anna Hedrickis a 79 y.o. female who presents for management of chronic depression.   Depression.  She  has been taking Lyrica  for for quite some time for management of depression and back pain but the cost of the medication has become productive and is putting on the [place]. She's continued take mirtazapine at bedtime but is requesting SSRI therapy. She has had prior experience with Paxil with good results.  Past Medical History  Diagnosis Date  . Barrett's esophagus 2007  . Sigmoid diverticulosis     by colonoscopy  . Aortic aneurysm   . Macular degeneration   . Anxiety   . Hyperlipidemia   . Hypertension   . Osteoporosis   . Lumbago   . Depression   . Bursitis 2012    left hip, improved with periodic steroid injection [facility] [place])  . Anemia 2012    of acute blood loss, resolved  . GERD (gastroesophageal reflux disease)    with Barretts Esophagus    Past Surgical History  Procedure Laterality Date  . Appendectomy    . Back surgery      X2..1975 arachnoid cyst cervical region([place]);1997 lumbosacral tumor,9 hr surgery  . Abdominal aortic aneurysm repair  July 2010    [facility]       The following portions of the patient's history were reviewed and updated as appropriate: Allergies, current medications, and problem list.    Review of Systems:   12 Pt  review of systems was negative except those addressed in the HPI,     History   Social History  . Marital Status: Widowed    Spouse Name: N/A    Number of Children: N/A  . Years of Education: N/A   Occupational History  . Not on file.   Social History Main Topics  . Smoking status: Current Every Day Smoker    Types: Cigarettes  . Smokeless tobacco: Never Used     Comment: HAS BEEN SMOKING 30+ YEARS,RESUMED TOBACCO USE AFTER AAA REPAIR  . Alcohol Use: No  . Drug Use: No  . Sexual Activity: Not on file   Other Topics Concern  . Not on file   Social History Narrative  . No narrative on file    Objective:  Filed Vitals:   04/08/13 1118  BP: 126/70  Pulse: 72  Temp: 97.8 F (36.6 C)     General appearance: alert, cooperative and appears stated age Ears: normal  TM's and external ear canals both ears Throat: lips, mucosa, and tongue normal; teeth and gums normal Neck: no adenopathy, no carotid bruit, supple, symmetrical, trachea midline and thyroid not enlarged, symmetric, no tenderness/mass/nodules Back: symmetric, no curvature. ROM normal. No CVA tenderness. Lungs: clear to auscultation bilaterally Heart: regular rate and rhythm, S1, S2 normal, no murmur, click, rub or gallop Abdomen: soft, non-tender; bowel sounds normal; no masses,  no organomegaly Pulses: 2+ and symmetric Skin: Skin color, texture, turgor normal. No rashes or lesions Lymph nodes: Cervical, supraclavicular, and axillary nodes normal.  Assessment  and Plan:  Depression Complicated by anxiety and chronic pain. The Lyrica has made her too tired and is Coumadin and on the whole or in month. We discussed tapering her off of Lyrica in starting Paxil for depression. Continue mirtazapine at bedtime for insomnia and appetite  COPD (chronic obstructive pulmonary disease) Secondary to lifelong tobacco use. Lungs are clear today and she is using her inhalers as directed.  Tobacco abuse counseling Risks of continued tobacco use were discussed. She is not currently interested in tobacco cessation.   Unspecified vitamin D deficiency Advised to continue to thousand units of vitamin D daily for persistently  subnormal levels   Updated Medication List Outpatient Encounter Prescriptions as of 04/08/2013  Medication Sig  . albuterol (PROVENTIL HFA;VENTOLIN HFA) 108 (90 BASE) MCG/ACT inhaler Inhale 2 puffs into the lungs every 6 (six) hours as needed for wheezing.  . cyanocobalamin (,VITAMIN B-12,) 1000 MCG/ML injection Inject 1 mL (1,000 mcg total) into the muscle every 30 (thirty) days.  . diazepam (VALIUM) 5 MG tablet Take 1 tablet (5 mg total) by mouth 2 (two) times daily as needed for anxiety or sleep.  Marland Kitchen HYDROcodone-acetaminophen (NORCO/VICODIN) 5-325 MG per tablet TAKE 1 TABLET BY MOUTH EVERY 6 HOURS  . lisinopril (PRINIVIL,ZESTRIL) 10 MG tablet Take 1 tablet (10 mg total) by mouth daily.  Marland Kitchen lovastatin (MEVACOR) 40 MG tablet TAKE 1 TABLET EVERY DAY  . mirtazapine (REMERON) 30 MG tablet TAKE 1 TABLET (15 MG TOTAL) BY MOUTH AT BEDTIME.  Marland Kitchen omeprazole (PRILOSEC) 40 MG capsule TAKE 1 CAPSULE (40 MG TOTAL) BY MOUTH DAILY BEFORE BREAKFAST.  Marland Kitchen Syringe, Disposable, 1 ML MISC for b12 injections,  Monthly  . TDaP (BOOSTRIX) 5-2.5-18.5 LF-MCG/0.5 injection Inject 0.5 mLs into the muscle once.  . [DISCONTINUED] cyanocobalamin (,VITAMIN B-12,) 1000 MCG/ML injection Inject 1 mL (1,000 mcg total) into the muscle every 30 (thirty) days.  . [DISCONTINUED]  Cyanocobalamin (B-12-SL) 1000 MCG SUBL Place under the tongue daily.  . [DISCONTINUED] HYDROcodone-acetaminophen (NORCO/VICODIN) 5-325 MG per tablet TAKE 1 TABLET BY MOUTH EVERY 6 HOURS  . [DISCONTINUED] pregabalin (LYRICA) 75 MG capsule TAKE ONE CAPSULE BY MOUTH 3 TIMES A DAY  . [DISCONTINUED] pregabalin (LYRICA) 75 MG capsule 2 (two) times daily. TAKE ONE CAPSULE BY MOUTH 3 TIMES A DAY  . PARoxetine (PAXIL) 10 MG tablet Take 1 tablet (10 mg total) by mouth daily.  . Syringe, Disposable, 1 ML MISC FOR USE WITH B12 INJECTABLE SOLUTION     Orders Placed This Encounter  Procedures  . [facility] MAMMOGRAPHY    No Follow-up on file.

## 2013-05-11 ENCOUNTER — Other Ambulatory Visit: Payer: Self-pay | Admitting: Internal Medicine

## 2013-05-11 ENCOUNTER — Telehealth: Payer: Self-pay | Admitting: *Deleted

## 2013-05-11 ENCOUNTER — Ambulatory Visit: Payer: Medicare Other | Admitting: Internal Medicine

## 2013-05-11 MED ORDER — SYRINGE (DISPOSABLE) 1 ML MISC: Status: DC

## 2013-05-11 NOTE — Telephone Encounter (Signed)
Refill sent.

## 2013-05-11 NOTE — Telephone Encounter (Signed)
Refill Request  BD 3 ML syringe 25GX1"   Use as directed

## 2013-05-22 ENCOUNTER — Ambulatory Visit: Payer: Medicare Other | Admitting: Internal Medicine

## 2013-05-26 ENCOUNTER — Telehealth: Payer: Self-pay | Admitting: Emergency Medicine

## 2013-05-26 ENCOUNTER — Ambulatory Visit: Payer: Medicare Other | Admitting: Internal Medicine

## 2013-05-26 NOTE — Telephone Encounter (Signed)
Pts daughter calling back to see if this is going to be done. Raquel what are your thoughts?

## 2013-05-26 NOTE — Telephone Encounter (Signed)
No

## 2013-05-26 NOTE — Telephone Encounter (Signed)
Tullo out with the flu. Patient was coming in today for a re-fill of meds and discuss how she is feeling. Patient is out of her HYDROcodone-acetaminophen (NORCO/VICODIN) 5-325 MG per tablet. Patient is taking the medication for her back, and general pain all over. Please advise.

## 2013-05-26 NOTE — Telephone Encounter (Signed)
Pt is coming in tomorrow @ 2:15 to see you for a refill

## 2013-05-26 NOTE — Telephone Encounter (Signed)
Is this the pt I signed the prescription for this morning?

## 2013-05-27 ENCOUNTER — Encounter: Payer: Self-pay | Admitting: Adult Health

## 2013-05-27 ENCOUNTER — Ambulatory Visit (INDEPENDENT_AMBULATORY_CARE_PROVIDER_SITE_OTHER): Payer: Medicare Other | Admitting: Adult Health

## 2013-05-27 VITALS — BP 102/60 | HR 77 | Resp 12 | Wt 130.0 lb

## 2013-05-27 DIAGNOSIS — G894 Chronic pain syndrome: Secondary | ICD-10-CM

## 2013-05-27 DIAGNOSIS — F329 Major depressive disorder, single episode, unspecified: Secondary | ICD-10-CM

## 2013-05-27 DIAGNOSIS — F3289 Other specified depressive episodes: Secondary | ICD-10-CM

## 2013-05-27 DIAGNOSIS — F32A Depression, unspecified: Secondary | ICD-10-CM

## 2013-05-27 MED ORDER — OMEPRAZOLE 40 MG PO CPDR: 40.0000 mg | DELAYED_RELEASE_CAPSULE | Freq: Every day | ORAL | Status: DC

## 2013-05-27 MED ORDER — HYDROCODONE-ACETAMINOPHEN 5-325 MG PO TABS: ORAL_TABLET | ORAL | Status: DC

## 2013-05-27 NOTE — Progress Notes (Signed)
Pre visit review using our clinic review tool, if applicable. No additional management support is needed unless otherwise documented below in the visit note. 

## 2013-05-27 NOTE — Progress Notes (Signed)
Patient ID: Lesley Atkin, female   DOB: [birth year], 80 y.o.   MRN: [id number]    Subjective:    Patient ID: Camie Patience, female    DOB: Sep 07, 1931, 80 y.o.   MRN: [id number]  HPI  Patient is a pleasant 80 year old female with history of chronic pain syndrome secondary to severe back pain, scoliosis, kyphosis, osteoporosis who presents to clinic for refill of her pain medication. She was also recently started on Paxil for depression which she reports significant improvement.   Past Medical History  Diagnosis Date  . Barrett's esophagus 2007  . Sigmoid diverticulosis     by colonoscopy  . Aortic aneurysm   . Macular degeneration   . Anxiety   . Hyperlipidemia   . Hypertension   . Osteoporosis   . Lumbago   . Depression   . Bursitis 2012    left hip, improved with periodic steroid injection [facility] [place])  . Anemia 2012    of acute blood loss, resolved  . GERD (gastroesophageal reflux disease)     with Barretts Esophagus    Current Outpatient Prescriptions on File Prior to Visit  Medication Sig Dispense Refill  . albuterol (PROVENTIL HFA;VENTOLIN HFA) 108 (90 BASE) MCG/ACT inhaler Inhale 2 puffs into the lungs every 6 (six) hours as needed for wheezing.  8.5 g  0  . cyanocobalamin (,VITAMIN B-12,) 1000 MCG/ML injection Inject 1 mL (1,000 mcg total) into the muscle every 30 (thirty) days.  10 mL  5  . diazepam (VALIUM) 5 MG tablet Take 1 tablet (5 mg total) by mouth 2 (two) times daily as needed for anxiety or sleep.  60 tablet  5  . lisinopril (PRINIVIL,ZESTRIL) 10 MG tablet Take 1 tablet (10 mg total) by mouth daily.  30 tablet  6  . lovastatin (MEVACOR) 40 MG tablet TAKE 1 TABLET EVERY DAY  90 tablet  2  . mirtazapine (REMERON) 30 MG tablet Take 1 tablet (30 mg total) by mouth at bedtime.  30 tablet  3  . PARoxetine (PAXIL) 10 MG tablet Take 1 tablet (10 mg total) by mouth daily.  30 tablet  1  . Syringe, Disposable, 1 ML MISC for b12 injections,  Monthly  25 each  0     No current facility-administered medications on file prior to visit.     Review of Systems  Musculoskeletal: Positive for back pain.       Chronic back pain. She is s/p 2 back surgeries. She is able to perform ADLs but limited in iADLs secondary to pain. Uses Norco for relief.  Psychiatric/Behavioral: Negative for suicidal ideas, confusion, sleep disturbance and agitation. The patient is not nervous/anxious.        Depression improved with Paxil  All other systems reviewed and are negative.       Objective:  BP 102/60  Pulse 77  Resp 12  Wt 130 lb (58.968 kg)  SpO2 92%   Physical Exam  Constitutional: She is oriented to person, place, and time.  Pleasant 80 year old female in no apparent distress.  Cardiovascular: Normal rate, regular rhythm and normal heart sounds.  Exam reveals no gallop.   No murmur heard. Pulmonary/Chest: Effort normal and breath sounds normal. No respiratory distress. She has no wheezes. She has no rales.  Musculoskeletal:  Severe scoliosis. Also with kyphosis. Ambulates with cane.  Neurological: She is alert and oriented to person, place, and time.  Skin: Skin is warm and dry.  Psychiatric: She has a normal mood  and affect. Her behavior is normal. Judgment and thought content normal.       Assessment & Plan:   1. Chronic pain syndrome Pt is s/p 2 back surgeries. Severe scoliosis, kyphosis and also has osteoporosis. Daily pain managed well with medication. Can perform her ADLs but has difficulty with iADLs 2/2 to pain and fatigue. Pain usually rated 6/10 - 8/10. Refill provided as below.  - HYDROcodone-acetaminophen (NORCO/VICODIN) 5-325 MG per tablet; TAKE 1 TABLET BY MOUTH EVERY 6 HOURS  Dispense: 120 tablet; Refill: 0  2. Depression Started Paxil back in December and feels improvement. No suicidal ideations or homicidal ideations. Anxiety also improved. Continue to follow. She has a follow up appt with her PCP at the end of this  month.

## 2013-05-29 ENCOUNTER — Telehealth: Payer: Self-pay | Admitting: Internal Medicine

## 2013-05-29 NOTE — Telephone Encounter (Signed)
Relevant patient education mailed to patient.  

## 2013-06-15 ENCOUNTER — Encounter: Payer: Self-pay | Admitting: Internal Medicine

## 2013-06-15 ENCOUNTER — Ambulatory Visit (INDEPENDENT_AMBULATORY_CARE_PROVIDER_SITE_OTHER): Payer: Medicare Other | Admitting: Internal Medicine

## 2013-06-15 VITALS — BP 116/66 | HR 51 | Temp 98.3°F | Resp 16 | Wt 131.0 lb

## 2013-06-15 DIAGNOSIS — F32A Depression, unspecified: Secondary | ICD-10-CM

## 2013-06-15 DIAGNOSIS — E785 Hyperlipidemia, unspecified: Secondary | ICD-10-CM

## 2013-06-15 DIAGNOSIS — F3289 Other specified depressive episodes: Secondary | ICD-10-CM

## 2013-06-15 DIAGNOSIS — M899 Disorder of bone, unspecified: Secondary | ICD-10-CM

## 2013-06-15 DIAGNOSIS — M545 Low back pain, unspecified: Secondary | ICD-10-CM

## 2013-06-15 DIAGNOSIS — E559 Vitamin D deficiency, unspecified: Secondary | ICD-10-CM

## 2013-06-15 DIAGNOSIS — Z79899 Other long term (current) drug therapy: Secondary | ICD-10-CM

## 2013-06-15 DIAGNOSIS — M949 Disorder of cartilage, unspecified: Secondary | ICD-10-CM

## 2013-06-15 DIAGNOSIS — F329 Major depressive disorder, single episode, unspecified: Secondary | ICD-10-CM

## 2013-06-15 DIAGNOSIS — M8580 Other specified disorders of bone density and structure, unspecified site: Secondary | ICD-10-CM

## 2013-06-15 DIAGNOSIS — G894 Chronic pain syndrome: Secondary | ICD-10-CM

## 2013-06-15 LAB — COMPREHENSIVE METABOLIC PANEL
ALK PHOS: 71 U/L (ref 39–117)
ALT: 15 U/L (ref 0–35)
AST: 17 U/L (ref 0–37)
Albumin: 3.8 g/dL (ref 3.5–5.2)
BILIRUBIN TOTAL: 0.5 mg/dL (ref 0.3–1.2)
BUN: 18 mg/dL (ref 6–23)
CO2: 28 meq/L (ref 19–32)
Calcium: 9.6 mg/dL (ref 8.4–10.5)
Chloride: 106 mEq/L (ref 96–112)
Creatinine, Ser: 1 mg/dL (ref 0.4–1.2)
GFR: 55.51 mL/min — AB (ref >60.00)
GLUCOSE: 86 mg/dL (ref 70–99)
Potassium: 4.6 mEq/L (ref 3.5–5.1)
Sodium: 141 mEq/L (ref 135–145)
Total Protein: 6.4 g/dL (ref 6.0–8.3)

## 2013-06-15 LAB — LDL CHOLESTEROL, DIRECT: Direct LDL: 83.3 mg/dL

## 2013-06-15 MED ORDER — LOVASTATIN 40 MG PO TABS: ORAL_TABLET | ORAL | Status: DC

## 2013-06-15 MED ORDER — DIAZEPAM 5 MG PO TABS: 5.0000 mg | ORAL_TABLET | Freq: Two times a day (BID) | ORAL | Status: DC | PRN

## 2013-06-15 MED ORDER — PAROXETINE HCL 10 MG PO TABS: 10.0000 mg | ORAL_TABLET | Freq: Every day | ORAL | Status: DC

## 2013-06-15 MED ORDER — LISINOPRIL 10 MG PO TABS: 10.0000 mg | ORAL_TABLET | Freq: Every day | ORAL | Status: DC

## 2013-06-15 MED ORDER — HYDROCODONE-ACETAMINOPHEN 5-325 MG PO TABS: ORAL_TABLET | ORAL | Status: DC

## 2013-06-15 NOTE — Patient Instructions (Signed)
You may refill your pain medication on or after April 1 since you received #120 on  Feb 2 and are using two daily   Continue paxil and remeron for  Your depression  Return in 3 months   Bone Density Test needed and ordered to evalute for osteoporosis

## 2013-06-15 NOTE — Progress Notes (Signed)
Patient ID: Anna Prince, female   DOB: April 04, 1932, 80 y.o.   MRN: [id number]   Patient Active Problem List   Diagnosis Date Noted  . Chronic pain syndrome 05/27/2013  . Tobacco abuse counseling 04/10/2013  . COPD exacerbation 08/21/2012  . Skin lesion of face 02/22/2012  . Anemia   . GERD (gastroesophageal reflux disease)   . Screening for colon cancer 06/18/2011  . COPD (chronic obstructive pulmonary disease) 06/18/2011  . Bursitis   . Skin cancer of nose 12/17/2010  . Unspecified vitamin D deficiency 12/17/2010  . Other and unspecified hyperlipidemia 12/17/2010  . Screening for malignant neoplasm of breast 12/17/2010  . Encounter for long-term (current) use of other medications 12/17/2010  . Aortic aneurysm, abdominal 12/17/2010  . Diverticulosis, sigmoid 12/08/2010  . Barrett's esophagus 12/08/2010  . Hyperlipidemia 12/08/2010  . Hypertension 12/08/2010  . Lumbago 12/08/2010  . Osteoporosis 12/08/2010  . Depression 12/08/2010  . Cognitive impairment 12/08/2010    Subjective:  CC:   Chief Complaint  Patient presents with  . Follow-up    1 month for paxil    HPI:   Anna Prince is a 80 y.o. female who presents for  Follow up on depression.  She was started on low dose paxil last month and Has increased paxil to full tablet, 10 mg dose.  She also changed the time of her administration to morning and continues to use mirtazipine 30 mg at night.  She is sleeping well . appetite is improving but labile .  Wt is stable. Affect is much brighter today.    Using vicodin for pain control  #120 given on Feb 2 by RR  patient states that she is using two daily    Past Medical History  Diagnosis Date  . Barrett's esophagus 2007  . Sigmoid diverticulosis     by colonoscopy  . Aortic aneurysm   . Macular degeneration   . Anxiety   . Hyperlipidemia   . Hypertension   . Osteoporosis   . Lumbago   . Depression   . Bursitis 2012    left hip, improved with periodic  steroid injection [facility] [place])  . Anemia 2012    of acute blood loss, resolved  . GERD (gastroesophageal reflux disease)     with Barretts Esophagus    Past Surgical History  Procedure Laterality Date  . Appendectomy    . Back surgery      X2..1975 arachnoid cyst cervical region(blumquist,gso);1997 lumbosacral tumor,9 hr surgery  . Abdominal aortic aneurysm repair  July 2010    [facility]       The following portions of the patient's history were reviewed and updated as appropriate: Allergies, current medications, and problem list.    Review of Systems:   Patient denies headache, fevers, malaise, unintentional weight loss, skin rash, eye pain, sinus congestion and sinus pain, sore throat, dysphagia,  hemoptysis , cough, dyspnea, wheezing, chest pain, palpitations, orthopnea, edema, abdominal pain, nausea, melena, diarrhea, constipation, flank pain, dysuria, hematuria, urinary  Frequency, nocturia, numbness, tingling, seizures,  Focal weakness, Loss of consciousness,  Tremor, insomnia, depression, anxiety, and suicidal ideation.     History   Social History  . Marital Status: Widowed    Spouse Name: N/A    Number of Children: N/A  . Years of Education: N/A   Occupational History  . Not on file.   Social History Main Topics  . Smoking status: Current Every Day Smoker    Types: Cigarettes  . Smokeless  tobacco: Never Used     Comment: HAS BEEN SMOKING 30+ YEARS,RESUMED TOBACCO USE AFTER AAA REPAIR  . Alcohol Use: No  . Drug Use: No  . Sexual Activity: Not on file   Other Topics Concern  . Not on file   Social History Narrative  . No narrative on file    Objective:  Filed Vitals:   06/15/13 1407  BP: 116/66  Pulse: 51  Temp: 98.3 F (36.8 C)  Resp: 16     General appearance: alert, cooperative and appears stated age Neck: no adenopathy, no carotid bruit, supple, symmetrical, trachea midline and thyroid not enlarged, symmetric, no  tenderness/mass/nodules Back: symmetric, no curvature. ROM normal. No CVA tenderness. Lungs: clear to auscultation bilaterally Heart: regular rate and rhythm, S1, S2 normal, no murmur, click, rub or gallop Abdomen: soft, non-tender; bowel sounds normal; no masses,  no organomegaly Pulses: 2+ and symmetric Skin: Skin color, texture, turgor normal. No rashes or lesions Psych: smiles easily,  Makes good eye contact Lymph nodes: Cervical, supraclavicular, and axillary nodes normal.  Assessment and Plan:  Depression Affect much improved with Paxil/remeron,.  No changes today.  Return in 3 months.   Lumbago Managed with Vicodin and Lyrica. Using 2 vicodin daily. Refill dated April 1. She is not candidate for surgery given her history of aortic aneurysm COPD.     Updated Medication List Outpatient Encounter Prescriptions as of 06/15/2013  Medication Sig  . albuterol (PROVENTIL HFA;VENTOLIN HFA) 108 (90 BASE) MCG/ACT inhaler Inhale 2 puffs into the lungs every 6 (six) hours as needed for wheezing.  . cyanocobalamin (,VITAMIN B-12,) 1000 MCG/ML injection Inject 1 mL (1,000 mcg total) into the muscle every 30 (thirty) days.  . diazepam (VALIUM) 5 MG tablet Take 1 tablet (5 mg total) by mouth 2 (two) times daily as needed.  Marland Kitchen HYDROcodone-acetaminophen (NORCO/VICODIN) 5-325 MG per tablet TAKE 1 TABLET BY MOUTH EVERY 6 HOURS  . lisinopril (PRINIVIL,ZESTRIL) 10 MG tablet Take 1 tablet (10 mg total) by mouth daily.  Marland Kitchen lovastatin (MEVACOR) 40 MG tablet TAKE 1 TABLET EVERY DAY  . mirtazapine (REMERON) 30 MG tablet Take 1 tablet (30 mg total) by mouth at bedtime.  Marland Kitchen omeprazole (PRILOSEC) 40 MG capsule Take 1 capsule (40 mg total) by mouth daily.  Marland Kitchen PARoxetine (PAXIL) 10 MG tablet Take 1 tablet (10 mg total) by mouth daily.  . Syringe, Disposable, 1 ML MISC for b12 injections,  Monthly  . [DISCONTINUED] diazepam (VALIUM) 5 MG tablet Take 1 tablet (5 mg total) by mouth 2 (two) times daily as needed for  anxiety or sleep.  . [DISCONTINUED] HYDROcodone-acetaminophen (NORCO/VICODIN) 5-325 MG per tablet TAKE 1 TABLET BY MOUTH EVERY 6 HOURS  . [DISCONTINUED] lisinopril (PRINIVIL,ZESTRIL) 10 MG tablet Take 1 tablet (10 mg total) by mouth daily.  . [DISCONTINUED] lovastatin (MEVACOR) 40 MG tablet TAKE 1 TABLET EVERY DAY  . [DISCONTINUED] PARoxetine (PAXIL) 10 MG tablet Take 1 tablet (10 mg total) by mouth daily.     Orders Placed This Encounter  Procedures  . DG Bone Density  . Comp Met (CMET)  . Vit D  25 hydroxy (rtn osteoporosis monitoring)  . LDL cholesterol, direct    No Follow-up on file.

## 2013-06-16 ENCOUNTER — Telehealth: Payer: Self-pay | Admitting: Internal Medicine

## 2013-06-16 LAB — VITAMIN D 25 HYDROXY (VIT D DEFICIENCY, FRACTURES): Vit D, 25-Hydroxy: 35 ng/mL (ref 30–89)

## 2013-06-16 NOTE — Telephone Encounter (Signed)
Relevant patient education mailed to patient.  

## 2013-06-16 NOTE — Assessment & Plan Note (Signed)
Affect much improved with Paxil/remeron,.  No changes today.  Return in 3 months.

## 2013-06-16 NOTE — Assessment & Plan Note (Addendum)
Managed with Vicodin and Lyrica. Using 2 vicodin daily. Refill dated April 1. She is not candidate for surgery given her history of aortic aneurysm COPD.

## 2013-06-17 ENCOUNTER — Encounter: Payer: Self-pay | Admitting: *Deleted

## 2013-08-26 ENCOUNTER — Ambulatory Visit (INDEPENDENT_AMBULATORY_CARE_PROVIDER_SITE_OTHER): Payer: Medicare Other | Admitting: Internal Medicine

## 2013-08-26 ENCOUNTER — Encounter: Payer: Self-pay | Admitting: Internal Medicine

## 2013-08-26 ENCOUNTER — Encounter (INDEPENDENT_AMBULATORY_CARE_PROVIDER_SITE_OTHER): Payer: Self-pay

## 2013-08-26 VITALS — BP 168/94 | HR 67 | Temp 97.6°F | Resp 16 | Wt 128.2 lb

## 2013-08-26 DIAGNOSIS — G894 Chronic pain syndrome: Secondary | ICD-10-CM

## 2013-08-26 DIAGNOSIS — M545 Low back pain, unspecified: Secondary | ICD-10-CM

## 2013-08-26 DIAGNOSIS — R4689 Other symptoms and signs involving appearance and behavior: Secondary | ICD-10-CM

## 2013-08-26 DIAGNOSIS — F919 Conduct disorder, unspecified: Secondary | ICD-10-CM

## 2013-08-26 DIAGNOSIS — R4189 Other symptoms and signs involving cognitive functions and awareness: Secondary | ICD-10-CM

## 2013-08-26 DIAGNOSIS — F329 Major depressive disorder, single episode, unspecified: Secondary | ICD-10-CM

## 2013-08-26 DIAGNOSIS — F3289 Other specified depressive episodes: Secondary | ICD-10-CM

## 2013-08-26 DIAGNOSIS — R413 Other amnesia: Secondary | ICD-10-CM

## 2013-08-26 DIAGNOSIS — G3184 Mild cognitive impairment, so stated: Secondary | ICD-10-CM

## 2013-08-26 DIAGNOSIS — F32A Depression, unspecified: Secondary | ICD-10-CM

## 2013-08-26 DIAGNOSIS — I10 Essential (primary) hypertension: Secondary | ICD-10-CM

## 2013-08-26 DIAGNOSIS — Z79899 Other long term (current) drug therapy: Secondary | ICD-10-CM

## 2013-08-26 LAB — COMPREHENSIVE METABOLIC PANEL
ALT: 16 U/L (ref 0–35)
AST: 23 U/L (ref 0–37)
Albumin: 4.1 g/dL (ref 3.5–5.2)
Alkaline Phosphatase: 76 U/L (ref 39–117)
BILIRUBIN TOTAL: 0.4 mg/dL (ref 0.2–1.2)
BUN: 20 mg/dL (ref 6–23)
CALCIUM: 9.9 mg/dL (ref 8.4–10.5)
CHLORIDE: 106 meq/L (ref 96–112)
CO2: 25 mEq/L (ref 19–32)
Creatinine, Ser: 1.1 mg/dL (ref 0.4–1.2)
GFR: 52.5 mL/min — AB (ref >60.00)
Glucose, Bld: 94 mg/dL (ref 70–99)
Potassium: 4.6 mEq/L (ref 3.5–5.1)
SODIUM: 140 meq/L (ref 135–145)
TOTAL PROTEIN: 6.9 g/dL (ref 6.0–8.3)

## 2013-08-26 LAB — VITAMIN B12: Vitamin B-12: 481 pg/mL (ref 211–911)

## 2013-08-26 LAB — TSH: TSH: 0.48 u[IU]/mL (ref 0.35–4.50)

## 2013-08-26 MED ORDER — MELOXICAM 15 MG PO TABS: 15.0000 mg | ORAL_TABLET | Freq: Every day | ORAL | Status: DC

## 2013-08-26 MED ORDER — PAROXETINE HCL 20 MG PO TABS: 20.0000 mg | ORAL_TABLET | Freq: Every day | ORAL | Status: DC

## 2013-08-26 NOTE — Patient Instructions (Signed)
I am changing your ibuprofen to once daily meloxicam .  Take with food .  You can take up to 4  500 mg tylenols daily along with the meloxicam for pain   Or 6 of the 325 mg tylenols daily)  We have increased the paxil to 20 mg to help your anxiety   Get your BP checked in about a week at [facility]  Call if it is > 140/80   Checking kidney function today
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
## 2013-08-26 NOTE — Progress Notes (Signed)
Pre-visit discussion using our clinic review tool. No additional management support is needed unless otherwise documented below in the visit note.  

## 2013-08-27 ENCOUNTER — Telehealth: Payer: Self-pay | Admitting: Internal Medicine

## 2013-08-27 ENCOUNTER — Encounter: Payer: Self-pay | Admitting: Internal Medicine

## 2013-08-27 LAB — FOLATE RBC: RBC Folate: 496 ng/mL (ref >280)

## 2013-08-27 LAB — RPR

## 2013-08-27 NOTE — Assessment & Plan Note (Signed)
Well controlled on current regimen. Renal function stable, no changes today. 

## 2013-08-27 NOTE — Assessment & Plan Note (Addendum)
Affect much improved with Paxil/remeron,.  ncreasing paxil to 20 mg daily.  Return in 3 months.
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
## 2013-08-27 NOTE — Assessment & Plan Note (Signed)
MMSE  Was 27/30, and clock drawing is somehat accurate.  Screening for reversible causes including mri mra brain

## 2013-08-27 NOTE — Telephone Encounter (Signed)
Relevant patient education assigned to patient using Emmi. ° °

## 2013-09-06 ENCOUNTER — Other Ambulatory Visit: Payer: Self-pay | Admitting: Internal Medicine

## 2013-09-17 ENCOUNTER — Ambulatory Visit: Payer: Self-pay | Admitting: Internal Medicine

## 2013-09-21 ENCOUNTER — Telehealth: Payer: Self-pay | Admitting: Internal Medicine

## 2013-09-21 NOTE — Telephone Encounter (Signed)
MRi Brain done last week to evaluate for cause of memory loss showed moderate atrophy and chronic small vessel ischemic changes throughout the entire brain as well as prior small strokes in the cerebellum9 back part of the brain, affects balance).  So she is dealing with vascular dementia, not alzheimers.  Does she have a follow up to discuss?

## 2013-09-21 NOTE — Telephone Encounter (Signed)
Patient has a follow up 09/28/13  With you will advise patient to follow up on MRI.

## 2013-09-23 ENCOUNTER — Encounter: Payer: Self-pay | Admitting: Internal Medicine

## 2013-09-28 ENCOUNTER — Ambulatory Visit (INDEPENDENT_AMBULATORY_CARE_PROVIDER_SITE_OTHER): Payer: Medicare Other | Admitting: Internal Medicine

## 2013-09-28 ENCOUNTER — Encounter: Payer: Self-pay | Admitting: Internal Medicine

## 2013-09-28 VITALS — BP 170/76 | HR 63 | Temp 97.5°F | Resp 16 | Ht 63.5 in | Wt 129.5 lb

## 2013-09-28 DIAGNOSIS — F17200 Nicotine dependence, unspecified, uncomplicated: Secondary | ICD-10-CM

## 2013-09-28 DIAGNOSIS — Z7189 Other specified counseling: Secondary | ICD-10-CM

## 2013-09-28 DIAGNOSIS — G3184 Mild cognitive impairment, so stated: Secondary | ICD-10-CM

## 2013-09-28 DIAGNOSIS — F32A Depression, unspecified: Secondary | ICD-10-CM

## 2013-09-28 DIAGNOSIS — F3289 Other specified depressive episodes: Secondary | ICD-10-CM

## 2013-09-28 DIAGNOSIS — F329 Major depressive disorder, single episode, unspecified: Secondary | ICD-10-CM

## 2013-09-28 DIAGNOSIS — R413 Other amnesia: Secondary | ICD-10-CM

## 2013-09-28 DIAGNOSIS — R011 Cardiac murmur, unspecified: Secondary | ICD-10-CM

## 2013-09-28 DIAGNOSIS — Z716 Tobacco abuse counseling: Secondary | ICD-10-CM

## 2013-09-28 NOTE — Progress Notes (Signed)
Pre visit review using our clinic review tool, if applicable. No additional management support is needed unless otherwise documented below in the visit note. 

## 2013-09-28 NOTE — Progress Notes (Signed)
Patient ID: Anna Prince, female   DOB: July 24, 1931, 80 y.o.   MRN: [id number]   Patient Active Problem List   Diagnosis Date Noted  . Chronic pain syndrome 05/27/2013  . Tobacco abuse counseling 04/10/2013  . COPD exacerbation 08/21/2012  . Skin lesion of face 02/22/2012  . Anemia   . GERD (gastroesophageal reflux disease)   . Screening for colon cancer 06/18/2011  . COPD (chronic obstructive pulmonary disease) 06/18/2011  . Bursitis   . Skin cancer of nose 12/17/2010  . Unspecified vitamin D deficiency 12/17/2010  . Other and unspecified hyperlipidemia 12/17/2010  . Screening for malignant neoplasm of breast 12/17/2010  . Encounter for long-term (current) use of other medications 12/17/2010  . Aortic aneurysm, abdominal 12/17/2010  . Diverticulosis, sigmoid 12/08/2010  . Barrett's esophagus 12/08/2010  . Hyperlipidemia 12/08/2010  . Hypertension 12/08/2010  . Lumbago 12/08/2010  . Osteoporosis 12/08/2010  . Depression 12/08/2010  . Mild cognitive impairment with memory loss 12/08/2010    Subjective:  CC:   Chief Complaint  Patient presents with  . Follow-up    1 month followup    HPI:   Anna Prince is a 80 y.o. female who presents for  Follow up on cognitive changes evaluated last month in the setting of worsening depressing  With MMSE and MRI brain.   Paxil dose was increased last month.  She feels generally better.  abnormal MRI showing old strokes , chronic microvascular ischemic changes,  Was discussed with patinet today.    Past Medical History  Diagnosis Date  . Barrett's esophagus 2007  . Sigmoid diverticulosis     by colonoscopy  . Aortic aneurysm   . Macular degeneration   . Anxiety   . Hyperlipidemia   . Hypertension   . Osteoporosis   . Lumbago   . Depression   . Bursitis 2012    left hip, improved with periodic steroid injection [facility] [place])  . Anemia 2012    of acute blood loss, resolved  . GERD (gastroesophageal reflux disease)      with Barretts Esophagus    Past Surgical History  Procedure Laterality Date  . Appendectomy    . Back surgery      X2..1975 arachnoid cyst cervical region(blumquist,gso);1997 lumbosacral tumor,9 hr surgery  . Abdominal aortic aneurysm repair  July 2010    [facility]       The following portions of the patient's history were reviewed and updated as appropriate: Allergies, current medications, and problem list.    Review of Systems:   Patient denies headache, fevers, malaise, unintentional weight loss, skin rash, eye pain, sinus congestion and sinus pain, sore throat, dysphagia,  hemoptysis , cough, dyspnea, wheezing, chest pain, palpitations, orthopnea, edema, abdominal pain, nausea, melena, diarrhea, constipation, flank pain, dysuria, hematuria, urinary  Frequency, nocturia, numbness, tingling, seizures,  Focal weakness, Loss of consciousness,  Tremor, insomnia, depression, anxiety, and suicidal ideation.     History   Social History  . Marital Status: Widowed    Spouse Name: N/A    Number of Children: N/A  . Years of Education: N/A   Occupational History  . Not on file.   Social History Main Topics  . Smoking status: Current Every Day Smoker    Types: Cigarettes  . Smokeless tobacco: Never Used     Comment: HAS BEEN SMOKING 30+ YEARS,RESUMED TOBACCO USE AFTER AAA REPAIR  . Alcohol Use: No  . Drug Use: No  . Sexual Activity: Not on file  Other Topics Concern  . Not on file   Social History Narrative  . No narrative on file    Objective:  Filed Vitals:   09/28/13 1055  BP: 170/76  Pulse: 63  Temp: 97.5 F (36.4 C)  Resp: 16     General appearance: alert, cooperative and appears stated age Ears: normal TM's and external ear canals both ears Throat: lips, mucosa, and tongue normal; teeth and gums normal Neck: no adenopathy, no carotid bruit, supple, symmetrical, trachea midline and thyroid not enlarged, symmetric, no tenderness/mass/nodules Back:  symmetric, no curvature. ROM normal. No CVA tenderness. Lungs: clear to auscultation bilaterally Heart: regular rate and rhythm, S1, S2 normal, no murmur, click, rub or gallop Abdomen: soft, non-tender; bowel sounds normal; no masses,  no organomegaly Pulses: 2+ and symmetric Skin: Skin color, texture, turgor normal. No rashes or lesions Lymph nodes: Cervical, supraclavicular, and axillary nodes normal.  Assessment and Plan:  Depression Improved with increased paxil dose.,  No changes today   Mild cognitive impairment with memory loss MMSE score was > 24,  And MRI suggested vascular dementia as a cause. Discussed at length today wit patient and  daughter.   Tobacco abuse counseling Risks of continued tobacco use were discussed in light of recent MRI findings. She is not currently interested in tobacco cessation.   A total of 25 minutes was spent with patient more than half of which was spent in counseling on tobacco abuse  reviewing her MRI report and coordination of care.  Updated Medication List Outpatient Encounter Prescriptions as of 09/28/2013  Medication Sig  . albuterol (PROVENTIL HFA;VENTOLIN HFA) 108 (90 BASE) MCG/ACT inhaler Inhale 2 puffs into the lungs every 6 (six) hours as needed for wheezing.  . cyanocobalamin (,VITAMIN B-12,) 1000 MCG/ML injection Inject 1 mL (1,000 mcg total) into the muscle every 30 (thirty) days.  . diazepam (VALIUM) 5 MG tablet Take 1 tablet (5 mg total) by mouth 2 (two) times daily as needed.  Marland Kitchen HYDROcodone-acetaminophen (NORCO/VICODIN) 5-325 MG per tablet TAKE 1 TABLET BY MOUTH EVERY 6 HOURS  . lisinopril (PRINIVIL,ZESTRIL) 10 MG tablet Take 1 tablet (10 mg total) by mouth daily.  Marland Kitchen lovastatin (MEVACOR) 40 MG tablet TAKE 1 TABLET EVERY DAY  . mirtazapine (REMERON) 30 MG tablet TAKE 1 TABLET (30 MG TOTAL) BY MOUTH AT BEDTIME.  Marland Kitchen omeprazole (PRILOSEC) 40 MG capsule Take 1 capsule (40 mg total) by mouth daily.  Marland Kitchen PARoxetine (PAXIL) 20 MG tablet Take 1  tablet (20 mg total) by mouth daily.  . Syringe, Disposable, 1 ML MISC for b12 injections,  Monthly  . [DISCONTINUED] meloxicam (MOBIC) 15 MG tablet Take 1 tablet (15 mg total) by mouth daily.     Orders Placed This Encounter  Procedures  . Ambulatory referral to Cardiology    Return in about 3 months (around 12/29/2013).

## 2013-09-28 NOTE — Assessment & Plan Note (Signed)
MMSE score was > 24,  And MRI suggested vascular dementia as a cause. Discussed at length today wit patient and  daughter.

## 2013-09-28 NOTE — Assessment & Plan Note (Signed)
Improved with increased paxil dose.,  No changes today

## 2013-09-28 NOTE — Patient Instructions (Addendum)
Your MRI of the brain suggests that your memory issues are NOT due to Alzheimers Dementia, but due to chronic changes due to smoking, cholesteral and hypertension  Quitting smoking,  Taking a baby aspirin and your cholesterol medication will help.  Your blood pressure is very elevated again today .    Recheck BP this week and notify me of reading,  We may increase the lisinopril to 20 mg daily for bp > 130/80

## 2013-09-28 NOTE — Assessment & Plan Note (Signed)
Risks of continued tobacco use were discussed in light of recent MRI findings. She is not currently interested in tobacco cessation.

## 2013-10-19 ENCOUNTER — Encounter: Payer: Self-pay | Admitting: Internal Medicine

## 2013-12-05 ENCOUNTER — Other Ambulatory Visit: Payer: Self-pay | Admitting: Internal Medicine

## 2013-12-06 ENCOUNTER — Other Ambulatory Visit: Payer: Self-pay | Admitting: Internal Medicine

## 2013-12-20 ENCOUNTER — Other Ambulatory Visit: Payer: Self-pay | Admitting: Internal Medicine

## 2013-12-27 ENCOUNTER — Other Ambulatory Visit: Payer: Self-pay | Admitting: Internal Medicine

## 2013-12-29 NOTE — Telephone Encounter (Signed)
Last refill 8.1.15, last OV 6.8.15.  Please advise refill.

## 2013-12-30 NOTE — Telephone Encounter (Signed)
Rx faxed to pharmacy  

## 2013-12-30 NOTE — Telephone Encounter (Signed)
Ok to refill,  printed rx  

## 2013-12-31 ENCOUNTER — Other Ambulatory Visit: Payer: Self-pay | Admitting: Internal Medicine

## 2013-12-31 NOTE — Telephone Encounter (Signed)
This was refilled yesterday.  If the old rx is found please tear up.

## 2013-12-31 NOTE — Telephone Encounter (Signed)
Last refill 8.1.15, last OV 6.8.15.  Please advise refill

## 2014-01-25 ENCOUNTER — Telehealth: Payer: Self-pay

## 2014-01-25 MED ORDER — PAROXETINE HCL 20 MG PO TABS: 20.0000 mg | ORAL_TABLET | Freq: Every day | ORAL | Status: DC

## 2014-01-25 NOTE — Telephone Encounter (Signed)
The patient called and stated she changed her pharmacy to the [facility] on [address] in [place]  She is hoping to get a refill on paxil.

## 2014-01-25 NOTE — Telephone Encounter (Signed)
Rx sent to pharmacy by escript  

## 2014-02-18 ENCOUNTER — Encounter: Payer: Self-pay | Admitting: Internal Medicine

## 2014-02-18 ENCOUNTER — Ambulatory Visit (INDEPENDENT_AMBULATORY_CARE_PROVIDER_SITE_OTHER): Payer: Medicare Other | Admitting: Internal Medicine

## 2014-02-18 VITALS — BP 160/78 | HR 75 | Temp 98.1°F | Resp 16 | Ht 63.5 in | Wt 131.8 lb

## 2014-02-18 DIAGNOSIS — J432 Centrilobular emphysema: Secondary | ICD-10-CM

## 2014-02-18 DIAGNOSIS — K2270 Barrett's esophagus without dysplasia: Secondary | ICD-10-CM

## 2014-02-18 DIAGNOSIS — F329 Major depressive disorder, single episode, unspecified: Secondary | ICD-10-CM

## 2014-02-18 DIAGNOSIS — Z716 Tobacco abuse counseling: Secondary | ICD-10-CM

## 2014-02-18 DIAGNOSIS — F0150 Vascular dementia without behavioral disturbance: Secondary | ICD-10-CM

## 2014-02-18 DIAGNOSIS — I714 Abdominal aortic aneurysm, without rupture, unspecified: Secondary | ICD-10-CM

## 2014-02-18 DIAGNOSIS — Z23 Encounter for immunization: Secondary | ICD-10-CM

## 2014-02-18 DIAGNOSIS — F32A Depression, unspecified: Secondary | ICD-10-CM

## 2014-02-18 MED ORDER — LISINOPRIL 20 MG PO TABS: 20.0000 mg | ORAL_TABLET | Freq: Every day | ORAL | Status: DC

## 2014-02-18 NOTE — Patient Instructions (Signed)
Your tests and MRI indicate that your memory problems are due to Vascular dementia  This does not progress like Alzheimers,  But is caused by smoking and atherosclerosis  PLEASE  QUIT SMOKING  Return for fasting labs in late November

## 2014-02-18 NOTE — Progress Notes (Signed)
Patient ID: Anna Prince, female   DOB: 1932/04/16, 80 y.o.   MRN: [id number]  Patient Active Problem List   Diagnosis Date Noted  . Vascular dementia of acute onset without behavioral disturbance 02/20/2014  . Chronic pain syndrome 05/27/2013  . Tobacco abuse counseling 04/10/2013  . COPD exacerbation 08/21/2012  . Skin lesion of face 02/22/2012  . GERD (gastroesophageal reflux disease)   . Screening for colon cancer 06/18/2011  . COPD (chronic obstructive pulmonary disease) 06/18/2011  . Bursitis   . Skin cancer of nose 12/17/2010  . Unspecified vitamin D deficiency 12/17/2010  . Other and unspecified hyperlipidemia 12/17/2010  . Screening for malignant neoplasm of breast 12/17/2010  . Encounter for long-term (current) use of other medications 12/17/2010  . Aortic aneurysm, abdominal 12/17/2010  . Diverticulosis, sigmoid 12/08/2010  . Barrett's esophagus 12/08/2010  . Hyperlipidemia 12/08/2010  . Hypertension 12/08/2010  . Lumbago 12/08/2010  . Osteoporosis 12/08/2010  . Depression 12/08/2010    Subjective:  CC:   Chief Complaint  Patient presents with  . Follow-up    general follow up    HPI:   Anna Prince is a 80 y.o. female who presents for   Follow up on chronic issues including COPD with onigoing tobacco abuse, hypertension with history of AAA, GERD with Barrett's esophagus, cognitive changes and and chronic pain secondary to DDD lumbar spine   Her daughter [name], who usually accompanies her is  Recovering a massive AMI with respiratory failure requiring intubation.  She was extubated earlier this week but still in the CCU.  Both she and her other daughter plan to quit smoking when [name] is discharged from the hospital since they live together.   Very anxious ,  But sleeping well.    Back pain is currently controlled without vicodin which was discontinued after a prescription disappeared from her home .    Recent workup for cogitive changes reviewqed;  vascular dementia diagnosis discussed.     Past Medical History  Diagnosis Date  . Barrett's esophagus 2007  . Sigmoid diverticulosis     by colonoscopy  . Aortic aneurysm   . Macular degeneration   . Anxiety   . Hyperlipidemia   . Hypertension   . Osteoporosis   . Lumbago   . Depression   . Bursitis 2012    left hip, improved with periodic steroid injection [facility] [place])  . Anemia 2012    of acute blood loss, resolved  . GERD (gastroesophageal reflux disease)     with Barretts Esophagus    Past Surgical History  Procedure Laterality Date  . Appendectomy    . Back surgery      X2..1975 arachnoid cyst cervical region(blumquist,g[place]);1997 lumbosacral tumor,9 hr surgery  . Abdominal aortic aneurysm repair  July 2010    [facility]       The following portions of the patient's history were reviewed and updated as appropriate: Allergies, current medications, and problem list.    Review of Systems:   Patient denies headache, fevers, malaise, unintentional weight loss, skin rash, eye pain, sinus congestion and sinus pain, sore throat, dysphagia,  hemoptysis , cough, dyspnea, wheezing, chest pain, palpitations, orthopnea, edema, abdominal pain, nausea, melena, diarrhea, constipation, flank pain, dysuria, hematuria, urinary  Frequency, nocturia, numbness, tingling, seizures,  Focal weakness, Loss of consciousness,  Tremor, insomnia, depression, anxiety, and suicidal ideation.     History   Social History  . Marital Status: Widowed    Spouse Name: N/A  Number of Children: N/A  . Years of Education: N/A   Occupational History  . Not on file.   Social History Main Topics  . Smoking status: Current Every Day Smoker    Types: Cigarettes  . Smokeless tobacco: Never Used     Comment: HAS BEEN SMOKING 30+ YEARS,RESUMED TOBACCO USE AFTER AAA REPAIR  . Alcohol Use: No  . Drug Use: No  . Sexual Activity: Not on file   Other Topics Concern  . Not on file   Social History  Narrative  . No narrative on file    Objective:  Filed Vitals:   02/18/14 1019  BP: 160/78  Pulse: 75  Temp: 98.1 F (36.7 C)  Resp: 16     General appearance: alert, cooperative and appears stated age Ears: normal TM's and external ear canals both ears Throat: lips, mucosa, and tongue normal; teeth and gums normal Neck: no adenopathy, no carotid bruit, supple, symmetrical, trachea midline and thyroid not enlarged, symmetric, no tenderness/mass/nodules Back: symmetric, no curvature. ROM normal. No CVA tenderness. Lungs: clear to auscultation bilaterally Heart: regular rate and rhythm, S1, S2 normal, no murmur, click, rub or gallop Abdomen: soft, non-tender; bowel sounds normal; no masses,  no organomegaly Pulses: 2+ and symmetric Skin: Skin color, texture, turgor normal. No rashes or lesions Lymph nodes: Cervical, supraclavicular, and axillary nodes normal.  Assessment and Plan:  COPD (chronic obstructive pulmonary disease) She does not use any maintenance inhalers because she has been asymptomatic.  Refill on Proair requested.   Barrett's esophagus Continue daily PPI  Vascular dementia of acute onset without behavioral disturbance Mild, MIR results and prognosis discussed with patient .  Tiral of aricept offered and declined.  Depression Improved with addition or mirtazipine to paxil.   No changes today   Aortic aneurysm, abdominal BP has been controlled but she continues to smoke  Tobacco abuse counseling She is motivated by her daughter's recent MI and plans to quit shen she is discharged. Smoking cessation instruction/counseling given:  commended patient for quitting and reviewed strategies for preventing relapses  A total of 25 minutes was spent with patient more than half of which was spent in counseling patient on the above mentioned issues , reviewing and explaining recent labs and imaging studies done, and coordination of care.   Updated Medication  List Outpatient Encounter Prescriptions as of 02/18/2014  Medication Sig  . albuterol (PROVENTIL HFA;VENTOLIN HFA) 108 (90 BASE) MCG/ACT inhaler Inhale 2 puffs into the lungs every 6 (six) hours as needed for wheezing.  . cyanocobalamin 2000 MCG tablet Take 2,000 mcg by mouth daily.  . diazepam (VALIUM) 5 MG tablet TAKE 1 TABLET BY MOUTH 2 TIMES A DAY AS NEEDED  . lisinopril (PRINIVIL,ZESTRIL) 20 MG tablet Take 1 tablet (20 mg total) by mouth daily.  Marland Kitchen lovastatin (MEVACOR) 40 MG tablet TAKE 1 TABLET EVERY DAY  . mirtazapine (REMERON) 30 MG tablet TAKE 1 TABLET (30 MG TOTAL) BY MOUTH AT BEDTIME.  Marland Kitchen omeprazole (PRILOSEC) 40 MG capsule Take 1 capsule (40 mg total) by mouth daily.  Marland Kitchen PARoxetine (PAXIL) 20 MG tablet Take 1 tablet (20 mg total) by mouth daily.  . [DISCONTINUED] lisinopril (PRINIVIL,ZESTRIL) 10 MG tablet Take 1 tablet (10 mg total) by mouth daily.  . [DISCONTINUED] meloxicam (MOBIC) 15 MG tablet Take 15 mg by mouth daily.  . cyanocobalamin (,VITAMIN B-12,) 1000 MCG/ML injection Inject 1 mL (1,000 mcg total) into the muscle every 30 (thirty) days.  . cyanocobalamin (,VITAMIN B-12,) 1000 MCG/ML  injection INJECT 1 ML (1,000 MCG TOTAL) INTO THE MUSCLE EVERY 30 (THIRTY) DAYS.  . HYDROcodone-acetaminophen (NORCO/VICODIN) 5-325 MG per tablet TAKE 1 TABLET BY MOUTH EVERY 6 HOURS  . Syringe, Disposable, 1 ML MISC for b12 injections,  Monthly  . [DISCONTINUED] mirtazapine (REMERON) 30 MG tablet TAKE 1 TABLET (30 MG TOTAL) BY MOUTH AT BEDTIME.     Orders Placed This Encounter  Procedures  . Pneumococcal polysaccharide vaccine 23-valent greater than or equal to 2yo subcutaneous/IM    Return in about 6 months (around 08/20/2014).

## 2014-02-18 NOTE — Progress Notes (Signed)
Pre-visit discussion using our clinic review tool. No additional management support is needed unless otherwise documented below in the visit note.  

## 2014-02-19 ENCOUNTER — Other Ambulatory Visit: Payer: Self-pay | Admitting: Internal Medicine

## 2014-02-20 ENCOUNTER — Encounter: Payer: Self-pay | Admitting: Internal Medicine

## 2014-02-20 DIAGNOSIS — F329 Major depressive disorder, single episode, unspecified: Secondary | ICD-10-CM

## 2014-02-20 DIAGNOSIS — F0151 Vascular dementia with behavioral disturbance: Secondary | ICD-10-CM | POA: Insufficient documentation

## 2014-02-20 DIAGNOSIS — F0153 Vascular dementia, unspecified severity, with mood disturbance: Secondary | ICD-10-CM | POA: Insufficient documentation

## 2014-02-20 NOTE — Assessment & Plan Note (Signed)
BP has been controlled but she continues to smoke

## 2014-02-20 NOTE — Assessment & Plan Note (Signed)
Mild, MIR results and prognosis discussed with patient .  Tiral of aricept offered and declined.

## 2014-02-20 NOTE — Assessment & Plan Note (Signed)
She does not use any maintenance inhalers because she has been asymptomatic.  Refill on Proair requested.

## 2014-02-20 NOTE — Assessment & Plan Note (Signed)
Continue daily PPI.  

## 2014-02-20 NOTE — Assessment & Plan Note (Signed)
She is motivated by her daughter's recent MI and plans to quit shen she is discharged. Smoking cessation instruction/counseling given:  commended patient for quitting and reviewed strategies for preventing relapses

## 2014-02-20 NOTE — Assessment & Plan Note (Signed)
Improved with addition or mirtazipine to paxil.   No changes today    

## 2014-02-27 ENCOUNTER — Telehealth: Payer: Self-pay

## 2014-02-27 NOTE — Telephone Encounter (Signed)
LVM for pt to call back.    RE: needs AWV scheduled for 2015

## 2014-03-01 NOTE — Telephone Encounter (Signed)
The patient called back and stated she had her cpe last week with Dr.Tullo.  She was seen 10/29.

## 2014-03-15 ENCOUNTER — Other Ambulatory Visit: Payer: Self-pay | Admitting: Internal Medicine

## 2014-03-22 ENCOUNTER — Telehealth: Payer: Self-pay | Admitting: *Deleted

## 2014-03-22 DIAGNOSIS — M810 Age-related osteoporosis without current pathological fracture: Secondary | ICD-10-CM

## 2014-03-22 DIAGNOSIS — E559 Vitamin D deficiency, unspecified: Secondary | ICD-10-CM

## 2014-03-22 DIAGNOSIS — I10 Essential (primary) hypertension: Secondary | ICD-10-CM

## 2014-03-22 DIAGNOSIS — D510 Vitamin B12 deficiency anemia due to intrinsic factor deficiency: Secondary | ICD-10-CM

## 2014-03-22 DIAGNOSIS — E785 Hyperlipidemia, unspecified: Secondary | ICD-10-CM

## 2014-03-22 NOTE — Telephone Encounter (Signed)
Pt is coming in tomorrow what labs and dx?  

## 2014-03-23 ENCOUNTER — Other Ambulatory Visit: Payer: Medicare Other

## 2014-03-25 ENCOUNTER — Other Ambulatory Visit: Payer: Self-pay | Admitting: Internal Medicine

## 2014-04-13 ENCOUNTER — Other Ambulatory Visit: Payer: Self-pay | Admitting: Internal Medicine

## 2014-04-14 ENCOUNTER — Other Ambulatory Visit (INDEPENDENT_AMBULATORY_CARE_PROVIDER_SITE_OTHER): Payer: Medicare Other

## 2014-04-14 DIAGNOSIS — E559 Vitamin D deficiency, unspecified: Secondary | ICD-10-CM

## 2014-04-14 DIAGNOSIS — E785 Hyperlipidemia, unspecified: Secondary | ICD-10-CM

## 2014-04-14 DIAGNOSIS — I10 Essential (primary) hypertension: Secondary | ICD-10-CM

## 2014-04-14 DIAGNOSIS — D510 Vitamin B12 deficiency anemia due to intrinsic factor deficiency: Secondary | ICD-10-CM

## 2014-04-14 LAB — LIPID PANEL
Cholesterol: 183 mg/dL (ref 0–200)
HDL: 49.1 mg/dL (ref >39.00)
LDL CALC: 107 mg/dL — AB (ref 0–99)
NonHDL: 133.9
Total CHOL/HDL Ratio: 4
Triglycerides: 134 mg/dL (ref 0.0–149.0)
VLDL: 26.8 mg/dL (ref 0.0–40.0)

## 2014-04-14 LAB — CBC WITH DIFFERENTIAL/PLATELET
BASOS ABS: 0 10*3/uL (ref 0.0–0.1)
Basophils Relative: 0.5 % (ref 0.0–3.0)
EOS PCT: 6.7 % — AB (ref 0.0–5.0)
Eosinophils Absolute: 0.3 10*3/uL (ref 0.0–0.7)
HEMATOCRIT: 36.6 % (ref 36.0–46.0)
Hemoglobin: 11.9 g/dL — ABNORMAL LOW (ref 12.0–15.0)
LYMPHS ABS: 1 10*3/uL (ref 0.7–4.0)
Lymphocytes Relative: 24.3 % (ref 12.0–46.0)
MCHC: 32.6 g/dL (ref 30.0–36.0)
MCV: 94.7 fl (ref 78.0–100.0)
MONO ABS: 0.2 10*3/uL (ref 0.1–1.0)
MONOS PCT: 5.8 % (ref 3.0–12.0)
NEUTROS PCT: 62.7 % (ref 43.0–77.0)
Neutro Abs: 2.6 10*3/uL (ref 1.4–7.7)
PLATELETS: 198 10*3/uL (ref 150.0–400.0)
RBC: 3.86 Mil/uL — ABNORMAL LOW (ref 3.87–5.11)
RDW: 13.4 % (ref 11.5–15.5)
WBC: 4.2 10*3/uL (ref 4.0–10.5)

## 2014-04-14 LAB — COMPREHENSIVE METABOLIC PANEL
ALK PHOS: 58 U/L (ref 39–117)
ALT: 13 U/L (ref 0–35)
AST: 15 U/L (ref 0–37)
Albumin: 3.9 g/dL (ref 3.5–5.2)
BILIRUBIN TOTAL: 0.4 mg/dL (ref 0.2–1.2)
BUN: 28 mg/dL — ABNORMAL HIGH (ref 6–23)
CO2: 25 mEq/L (ref 19–32)
Calcium: 9.2 mg/dL (ref 8.4–10.5)
Chloride: 109 mEq/L (ref 96–112)
Creatinine, Ser: 1.2 mg/dL (ref 0.4–1.2)
GFR: 46.37 mL/min — ABNORMAL LOW (ref >60.00)
Glucose, Bld: 107 mg/dL — ABNORMAL HIGH (ref 70–99)
Potassium: 4.8 mEq/L (ref 3.5–5.1)
SODIUM: 140 meq/L (ref 135–145)
Total Protein: 6.8 g/dL (ref 6.0–8.3)

## 2014-04-14 LAB — VITAMIN D 25 HYDROXY (VIT D DEFICIENCY, FRACTURES): VITD: 19.08 ng/mL — ABNORMAL LOW (ref 30.00–100.00)

## 2014-04-16 MED ORDER — ERGOCALCIFEROL 1.25 MG (50000 UT) PO CAPS: 50000.0000 [IU] | ORAL_CAPSULE | ORAL | Status: DC

## 2014-04-16 NOTE — Assessment & Plan Note (Signed)
Low at 19 in December 2015.  Drisdol rx weekly x 3 months

## 2014-04-16 NOTE — Addendum Note (Signed)
Addended by: Crecencio Mc on: 04/16/2014 10:21 AM   Modules accepted: Orders

## 2014-04-17 ENCOUNTER — Inpatient Hospital Stay: Payer: Self-pay | Admitting: Internal Medicine

## 2014-04-17 LAB — COMPREHENSIVE METABOLIC PANEL
ALK PHOS: 69 U/L
ANION GAP: 7 (ref 7–16)
Albumin: 3.3 g/dL — ABNORMAL LOW (ref 3.4–5.0)
BUN: 39 mg/dL — AB (ref 7–18)
Bilirubin,Total: 0.4 mg/dL (ref 0.2–1.0)
Calcium, Total: 8.9 mg/dL (ref 8.5–10.1)
Chloride: 112 mmol/L — ABNORMAL HIGH (ref 98–107)
Co2: 24 mmol/L (ref 21–32)
Creatinine: 1.69 mg/dL — ABNORMAL HIGH (ref 0.60–1.30)
EGFR (African American): 38 — ABNORMAL LOW
GFR CALC NON AF AMER: 31 — AB
Glucose: 142 mg/dL — ABNORMAL HIGH (ref 65–99)
Osmolality: 297 (ref 275–301)
POTASSIUM: 4.3 mmol/L (ref 3.5–5.1)
SGOT(AST): 22 U/L (ref 15–37)
SGPT (ALT): 22 U/L
Sodium: 143 mmol/L (ref 136–145)
Total Protein: 6.1 g/dL — ABNORMAL LOW (ref 6.4–8.2)

## 2014-04-17 LAB — PROTIME-INR
INR: 1
Prothrombin Time: 12.7 secs (ref 11.5–14.7)

## 2014-04-17 LAB — CBC
HCT: 38.1 % (ref 35.0–47.0)
HGB: 12.2 g/dL (ref 12.0–16.0)
MCH: 31.4 pg (ref 26.0–34.0)
MCHC: 32.1 g/dL (ref 32.0–36.0)
MCV: 98 fL (ref 80–100)
Platelet: 183 10*3/uL (ref 150–440)
RBC: 3.89 10*6/uL (ref 3.80–5.20)
RDW: 13.4 % (ref 11.5–14.5)
WBC: 16.1 10*3/uL — ABNORMAL HIGH (ref 3.6–11.0)

## 2014-04-18 LAB — CBC WITH DIFFERENTIAL/PLATELET
BASOS PCT: 0.1 %
Basophil #: 0 10*3/uL (ref 0.0–0.1)
EOS PCT: 0.7 %
Eosinophil #: 0.1 10*3/uL (ref 0.0–0.7)
HCT: 29 % — ABNORMAL LOW (ref 35.0–47.0)
HGB: 9.5 g/dL — ABNORMAL LOW (ref 12.0–16.0)
LYMPHS ABS: 0.8 10*3/uL — AB (ref 1.0–3.6)
LYMPHS PCT: 8.5 %
MCH: 31.9 pg (ref 26.0–34.0)
MCHC: 32.9 g/dL (ref 32.0–36.0)
MCV: 97 fL (ref 80–100)
Monocyte #: 0.6 x10 3/mm (ref 0.2–0.9)
Monocyte %: 6 %
Neutrophil #: 8.1 10*3/uL — ABNORMAL HIGH (ref 1.4–6.5)
Neutrophil %: 84.7 %
Platelet: 126 10*3/uL — ABNORMAL LOW (ref 150–440)
RBC: 2.99 10*6/uL — ABNORMAL LOW (ref 3.80–5.20)
RDW: 13 % (ref 11.5–14.5)
WBC: 9.6 10*3/uL (ref 3.6–11.0)

## 2014-04-18 LAB — BASIC METABOLIC PANEL
Anion Gap: 7 (ref 7–16)
BUN: 36 mg/dL — ABNORMAL HIGH (ref 7–18)
CHLORIDE: 113 mmol/L — AB (ref 98–107)
CO2: 22 mmol/L (ref 21–32)
CREATININE: 1.45 mg/dL — AB (ref 0.60–1.30)
Calcium, Total: 8 mg/dL — ABNORMAL LOW (ref 8.5–10.1)
EGFR (Non-African Amer.): 37 — ABNORMAL LOW
GFR CALC AF AMER: 45 — AB
Glucose: 115 mg/dL — ABNORMAL HIGH (ref 65–99)
OSMOLALITY: 292 (ref 275–301)
Potassium: 3.9 mmol/L (ref 3.5–5.1)
Sodium: 142 mmol/L (ref 136–145)

## 2014-04-18 LAB — CLOSTRIDIUM DIFFICILE(ARMC)

## 2014-04-18 LAB — OCCULT BLOOD X 1 CARD TO LAB, STOOL: Occult Blood, Feces: POSITIVE

## 2014-04-19 LAB — BASIC METABOLIC PANEL
Anion Gap: 9 (ref 7–16)
BUN: 18 mg/dL (ref 7–18)
CHLORIDE: 111 mmol/L — AB (ref 98–107)
Calcium, Total: 8 mg/dL — ABNORMAL LOW (ref 8.5–10.1)
Co2: 22 mmol/L (ref 21–32)
Creatinine: 1.06 mg/dL (ref 0.60–1.30)
EGFR (African American): >60
GFR CALC NON AF AMER: 53 — AB
GLUCOSE: 97 mg/dL (ref 65–99)
OSMOLALITY: 285 (ref 275–301)
Potassium: 3.9 mmol/L (ref 3.5–5.1)
SODIUM: 142 mmol/L (ref 136–145)

## 2014-04-19 LAB — URINALYSIS, COMPLETE
Bilirubin,UR: NEGATIVE
Glucose,UR: NEGATIVE mg/dL (ref 0–75)
Ketone: NEGATIVE
NITRITE: NEGATIVE
PH: 5 (ref 4.5–8.0)
Protein: NEGATIVE
SPECIFIC GRAVITY: 1.011 (ref 1.003–1.030)
WBC UR: 23 /HPF (ref 0–5)

## 2014-04-19 LAB — CBC WITH DIFFERENTIAL/PLATELET
BASOS ABS: 0 10*3/uL (ref 0.0–0.1)
BASOS PCT: 0.3 %
EOS PCT: 3.8 %
Eosinophil #: 0.3 10*3/uL (ref 0.0–0.7)
HCT: 29.9 % — AB (ref 35.0–47.0)
HGB: 9.9 g/dL — AB (ref 12.0–16.0)
LYMPHS PCT: 13 %
Lymphocyte #: 0.9 10*3/uL — ABNORMAL LOW (ref 1.0–3.6)
MCH: 31.9 pg (ref 26.0–34.0)
MCHC: 33 g/dL (ref 32.0–36.0)
MCV: 97 fL (ref 80–100)
MONOS PCT: 7.2 %
Monocyte #: 0.5 x10 3/mm (ref 0.2–0.9)
NEUTROS ABS: 5.3 10*3/uL (ref 1.4–6.5)
Neutrophil %: 75.7 %
Platelet: 117 10*3/uL — ABNORMAL LOW (ref 150–440)
RBC: 3.09 10*6/uL — ABNORMAL LOW (ref 3.80–5.20)
RDW: 13.2 % (ref 11.5–14.5)
WBC: 7 10*3/uL (ref 3.6–11.0)

## 2014-04-20 ENCOUNTER — Encounter: Payer: Self-pay | Admitting: *Deleted

## 2014-04-20 ENCOUNTER — Other Ambulatory Visit: Payer: Self-pay | Admitting: Internal Medicine

## 2014-04-20 LAB — COMPREHENSIVE METABOLIC PANEL
ALBUMIN: 2.5 g/dL — AB (ref 3.4–5.0)
ALK PHOS: 49 U/L
Anion Gap: 6 — ABNORMAL LOW (ref 7–16)
BILIRUBIN TOTAL: 0.3 mg/dL (ref 0.2–1.0)
BUN: 9 mg/dL (ref 7–18)
CALCIUM: 7.7 mg/dL — AB (ref 8.5–10.1)
CREATININE: 0.94 mg/dL (ref 0.60–1.30)
Chloride: 115 mmol/L — ABNORMAL HIGH (ref 98–107)
Co2: 21 mmol/L (ref 21–32)
EGFR (African American): >60
GLUCOSE: 91 mg/dL (ref 65–99)
Osmolality: 281 (ref 275–301)
POTASSIUM: 3.6 mmol/L (ref 3.5–5.1)
SGOT(AST): 19 U/L (ref 15–37)
SGPT (ALT): 12 U/L — ABNORMAL LOW
Sodium: 142 mmol/L (ref 136–145)
TOTAL PROTEIN: 5.4 g/dL — AB (ref 6.4–8.2)

## 2014-04-20 LAB — HEMOGLOBIN: HGB: 9.2 g/dL — AB (ref 12.0–16.0)

## 2014-04-20 LAB — PLATELET COUNT: PLATELETS: 111 10*3/uL — AB (ref 150–440)

## 2014-04-20 NOTE — Telephone Encounter (Signed)
Ok to refill,  printed rx  

## 2014-04-20 NOTE — Telephone Encounter (Signed)
Last OV 10/15 ok to fill?

## 2014-04-21 ENCOUNTER — Telehealth: Payer: Self-pay | Admitting: Internal Medicine

## 2014-04-21 LAB — CBC WITH DIFFERENTIAL/PLATELET
BASOS ABS: 0 10*3/uL (ref 0.0–0.1)
Basophil %: 0.3 %
Eosinophil #: 0.3 10*3/uL (ref 0.0–0.7)
Eosinophil %: 5.3 %
HCT: 29.2 % — ABNORMAL LOW (ref 35.0–47.0)
HGB: 9.8 g/dL — AB (ref 12.0–16.0)
Lymphocyte #: 0.7 10*3/uL — ABNORMAL LOW (ref 1.0–3.6)
Lymphocyte %: 14.3 %
MCH: 32.4 pg (ref 26.0–34.0)
MCHC: 33.5 g/dL (ref 32.0–36.0)
MCV: 97 fL (ref 80–100)
MONOS PCT: 9.2 %
Monocyte #: 0.5 x10 3/mm (ref 0.2–0.9)
NEUTROS PCT: 70.9 %
Neutrophil #: 3.7 10*3/uL (ref 1.4–6.5)
PLATELETS: 124 10*3/uL — AB (ref 150–440)
RBC: 3.01 10*6/uL — AB (ref 3.80–5.20)
RDW: 12.7 % (ref 11.5–14.5)
WBC: 5.2 10*3/uL (ref 3.6–11.0)

## 2014-04-21 NOTE — Telephone Encounter (Signed)
Pt needs HFU for acute colitis, d/c 12/30. Please advise/msn

## 2014-04-21 NOTE — Telephone Encounter (Signed)
Refill faxed as requested.

## 2014-04-21 NOTE — Telephone Encounter (Signed)
Patient is still pending discharge.

## 2014-04-26 NOTE — Telephone Encounter (Signed)
05/03/14 first available and would be a Monday night. Patient stated that she has a daughter in the hospital now and is doing fine from the flare-up of colitis and would like to wait a while if physician agrees to be seen.

## 2014-04-26 NOTE — Telephone Encounter (Signed)
That's fine , 1/11

## 2014-05-17 ENCOUNTER — Other Ambulatory Visit: Payer: Self-pay | Admitting: Internal Medicine

## 2014-05-27 ENCOUNTER — Other Ambulatory Visit: Payer: Self-pay | Admitting: Internal Medicine

## 2014-05-27 NOTE — Telephone Encounter (Signed)
Ok refill? 

## 2014-05-28 NOTE — Telephone Encounter (Signed)
Ok to refill,  Refill sent  

## 2014-05-31 ENCOUNTER — Other Ambulatory Visit: Payer: Self-pay | Admitting: *Deleted

## 2014-05-31 MED ORDER — OMEPRAZOLE 40 MG PO CPDR: 40.0000 mg | DELAYED_RELEASE_CAPSULE | Freq: Every day | ORAL | Status: DC

## 2014-06-17 ENCOUNTER — Other Ambulatory Visit: Payer: Self-pay | Admitting: Internal Medicine

## 2014-06-28 ENCOUNTER — Other Ambulatory Visit: Payer: Self-pay | Admitting: Unknown Physician Specialty

## 2014-07-05 ENCOUNTER — Ambulatory Visit: Payer: Self-pay | Admitting: Gastroenterology

## 2014-07-22 ENCOUNTER — Other Ambulatory Visit: Payer: Self-pay | Admitting: Internal Medicine

## 2014-07-23 ENCOUNTER — Ambulatory Visit: Payer: Self-pay | Admitting: Internal Medicine

## 2014-08-03 ENCOUNTER — Encounter: Payer: Self-pay | Admitting: Internal Medicine

## 2014-08-14 NOTE — Consult Note (Signed)
PATIENT NAME:  Anna Prince, Anna Prince MR#:  [id number] DATE OF BIRTH:  11-17-1931  DATE OF CONSULTATION:  04/19/2014  REFERRING PHYSICIAN:  Theodoro Grist, MD CONSULTING PHYSICIAN:  Verdie Shire, MD/Mar Zettler A. Jerelene Redden, ANP (Adult Nurse Practitioner)  REASON FOR CONSULTATION: Colitis.   PRIMARY CARE PHYSICIAN: Deborra Medina, MD   HISTORY OF PRESENT ILLNESS: This 81 year old patient reports she went to bed Saturday night feeling fine. She awoke out of sleep at 0300 with her "stomach killing me." She had primarily left lower quadrant and left upper quadrant abdominal pain associated with 1 episode of vomiting and nausea. She used the bathroom and passed a large amount of bloody stool. Her family called the emergency squad and she was transported to the Emergency Room. She had diarrhea yesterday and had 2 bad episodes of bleeding earlier this morning, last bowel movement was less blood and a little bit more solid. She says she has 10/10 left-sided abdominal pain if it is palpated, but it is 4/10 if you are not mashing on it. It is improved with IV Cipro and Flagyl. She is tolerating a clear liquid diet well. The patient did have a colonoscopy performed in 2012 for left lower quadrant abdominal pain and abnormal CT study. The colonoscopy showed diverticulosis and internal hemorrhoids.   PAST MEDICAL HISTORY:  1.  Diverticulosis.  2.  Abdominal aortic aneurysm, status post repair.  3.  Chronic scoliosis.  4.  Hypertension.  5.  Anxiety.  6.  Hyperlipidemia.   PAST SURGICAL HISTORY: 1.  Abdominal aortic aneurysm repair.  2.  Two back surgeries for scoliosis.   HOME MEDICATIONS:  1.  Omeprazole 40 mg daily.  2.  Lisinopril 20 mg daily.  3.  Diazepam 5 mg twice daily.  4.  Lovastatin 40 mg daily.  5.  Meloxicam 15 mg daily.  6.  Paroxetine 20 mg daily.  7.  Patient denies mirtazapine 30 mg at bedtime.  8.  Positive iron 3 tablets daily.  9.  Tylenol 2 tablets twice daily, dose unknown.   ALLERGIES:  MORPHINE WHICH MAKES HER CRAZY.   HABITS: Positive tobacco, 1/2 pack per day for 50 years. Negative alcohol or illicit drug use.   SOCIAL HISTORY: The patient lives at home with 2 daughters.   REVIEW OF SYSTEMS: Ten systems reviewed. Positive for chronic arthritis in the back, she does take Mobic but does not take any other nonsteroidal anti-inflammatories. She had 1 episode of chills when this abdominal pain hit her. She did not have any diaphoresis or fevers. She reports she enjoyed Christmas, ate dinner. No recent antibiotics, hospitalization, travel or ill contact. Remaining systems otherwise negative.   PHYSICAL EXAMINATION:  VITAL SIGNS: Temperature 98.4, pulse 71, respirations 18, blood pressure 138/63. Pulse oximetry on room air is 94%.  GENERAL: Elderly Caucasian female, nontoxic. She is in no distress.  HEENT: Shows head is normocephalic. Conjunctivae pink. Sclerae anicteric. Oral mucosa is moist and intact.  NECK: Supple without thyromegaly.  CARDIAC: S1, S2 without murmur or gallop.  RESPIRATORY: Lungs are CTA, respirations are nonlabored.  ABDOMEN: Soft. She definitely has positive tenderness left lower quadrant, which seems to be the greatest area of discomfort. There is no rigidity, rebound or guarding. She also has tenderness in the left upper to mid quadrant area as well as crossing abdomen. The abdomen is nondistended.  RECTAL: Deferred.  SKIN: Warm and dry without rash or edema.  MUSCULOSKELETAL: No joint swelling or inflammation.  NEUROLOGIC: Cranial nerves II-XII grossly intact. The patient  is able to sit up in bed and assist with position changes without difficulty.  PSYCHIATRIC: Affect and mood is within normal, she is a reasonable historian.   LABORATORY DATA: Admission laboratory studies 04/17/2014 notable for glucose 142, BUN 39, creatinine 1.69, albumin 3.3, total bilirubin 0.4, alkaline phosphatase 69, ALT 22. WBC 16, hemoglobin 12.2. Pro time 12.7, INR 1.0.  Clostridium difficile negative. Occult blood positive.   Repeat laboratory studies serially to include 04/19/2014: BUN is now 18, creatinine 1.06. Hemoglobin is 9.9, WBC 7.0, platelet count 183,000 down to 117,000.   RADIOLOGY: CT of the abdomen and pelvis without IV contrast performed 04/17/2014 showed abnormal thickening of colonic wall in the distal transverse colon, descending colon. There is small pericolonic stranding and fluid noted. A few colonic diverticulae in the left colon containing air. No definite evidence of extraluminal contrast material. Findings consistent with segmental colitis. This may be ischemic in nature or infectious in nature. There are multiple sigmoid colon diverticulae noted, but no thickening of the sigmoid colon wall. The uterus is atrophic. No nephrolithiasis, moderate-sized hiatal hernia, cyst in the left hepatic lobe measures 3.2 cm, extensive atherosclerotic calcification of abdominal aorta and SMA. Stable infrarenal abdominal aortic aneurysm. Degenerative changes of the lumbar spine and levoscoliosis.   IMPRESSION: The patient presents with abrupt acute severe abdominal pain with bloody diarrhea and abdominal tenderness. CT is favoring colitis and most likely explanation is ischemic colitis. Bacterial colitis has been considered, Clostridium difficile negative, comprehensive culture is pending. There was no obvious diverticulitis per CT study, but that is also a possible consideration, although not as likely. The patient has a long history of tobacco use and significant atherosclerotic cardiovascular disease with aortic aneurysm repair.   PLAN: She seems to be responding to the Cipro and Flagyl. Dr. Candace Cruise was consulted on this case and he does recommend watching her clinical course over the next day or two very carefully, and if no improvement consider flexible sigmoidoscopy.   Thank you for the consultation.   These services provided by Denice Paradise, MS, APRN, BC, ANP  under collaborative agreement with Verdie Shire, MD.    ____________________________ Janalyn Harder. Jerelene Redden, ANP (Adult Nurse Practitioner) kam:TT D: 04/19/2014 15:38:51 ET T: 04/19/2014 16:18:53 ET JOB#: [id number]  cc: Joelene Millin A. Jerelene Redden, ANP (Adult Nurse Practitioner), <Dictator> Janalyn Harder Sherlyn Hay, MSN, ANP-BC Adult Nurse Practitioner ELECTRONICALLY SIGNED 04/20/2014 13:04

## 2014-08-14 NOTE — H&P (Signed)
PATIENT NAME:  Anna Prince, Anna Prince MR#:  [id number] DATE OF BIRTH:  14-Feb-1932  DATE OF ADMISSION:  04/17/2014  PRIMARY CARE PHYSICIAN: Deborra Medina, MD  CHIEF COMPLAINT: Abdominal pain with blood in stool.   HISTORY OF PRESENT ILLNESS: The patient reports 1 day with symptoms of nausea, vomiting, and diarrhea with blood noted in her stool. She denies any measured fevers but endorses chills. The patient states that she woke up early this morning about 1:00 in the morning and had abdominal pain with nausea, and shortly thereafter she began vomiting. The patient endorses 3 to 5 episodes of vomiting prior to coming to the ED. The abdominal pain is aching in character.  The patient states that shortly after she began vomiting, she began having diarrhea. Per her daughter, who is present in the interview today, her daughter noticed blood in her stool. The patient states she had an episode similar to this several years ago and was told she had diverticulitis. She was treated at that time. The patient denies any recent sick contacts or any recent travel. The patient also denies recent antibiotic use. In the ED, the patient had a CT scan performed of her abdomen and pelvis, which showed a colitis in the distal transverse colon as well as the descending colon. She was also found to have elevated white blood cell count at 16, and at this point hospitalist group was called for admission. The patient denies any medication use to improve her symptoms, denies any aggravating or alleviating factors to her symptoms. The patient was given a dose of ciprofloxacin and a dose of Flagyl in the ED prior to the hospitalist being called.   PAST MEDICAL HISTORY: Diverticulosis, abdominal aortic aneurysm status post repair, chronic scoliosis, hypertension, anxiety, and hyperlipidemia.   PAST SURGICAL HISTORY: Abdominal aortic aneurysm repair somewhere around 2010 as well as 2 prior back surgeries for scoliosis. The patient is unsure  exactly what these surgeries were.   FAMILY HISTORY: Includes CAD and dementia.   SOCIAL HISTORY: The patient lives at home with her 2 daughters. She denies recent travel. She is a current smoker with a 25-pack-year smoking history. She denies alcohol or illicit drug use.   ALLERGIES: THE PATIENT STATES SHE HAS ONLY A MORPHINE ALLERGY, STATING THAT WHEN SHE TAKES MORPHINE IT MAKES HER "CRAZY."  CURRENT MEDICATIONS: Omeprazole 40 mg daily, lisinopril 20 mg daily, diazepam 5 mg b.i.d. p.r.n., lovastatin 40 mg daily, meloxicam 15 mg daily, mirtazapine 30 mg at bedtime, paroxetine 20 mg daily.  REVIEW OF SYSTEMS: CONSTITUTIONAL: The patient denies measured fever though endorses several episodes of chills. Denies significant fatigue or weakness. Denies significant weight changes.  EYES: Denies blurred vision. No eye pain or inflammation.  ENT: No ear pain. No hearing loss. No difficulty swallowing. No discharge or postnasal drip.  RESPIRATORY: No cough. No wheeze. No hemoptysis. No dyspnea.  CARDIOVASCULAR: No chest pain. No orthopnea. No edema. No syncope. GASTROINTESTINAL: Endorses nausea, vomiting, diarrhea with blood in the stool. Endorses abdominal pain.  GENITOURINARY: Denies dysuria, hematuria, frequency.  HEMATOLOGIC: Denies easy bruising. Denies bleeding other than blood in her stool, as above.  SKIN: Denies any acne, rashes, or lesions.  MUSCULOSKELETAL: Denies significant joint swelling. Denies any joint pain. Denies arthritis.  NEUROLOGIC: Denies numbness. Denies weakness. Denies headache. Denies seizures.  PSYCHIATRIC: Endorses chronic baseline anxiety, well treated with medications. Denies depression. Denies insomnia.  PHYSICAL EXAMINATION: VITAL SIGNS: Temperature 99.3, pulse 92, respirations 18, pulse oximetry 91% on room air, blood pressure  107/65.  GENERAL: This is an ill-appearing, elderly lady lying on her stretcher in no acute distress.  HEENT: Pupils equal, round, reactive  to light. Extraocular movements intact. No scleral icterus. No difficulty hearing. Oral mucosa is moist.  NECK: No thyromegaly. Her neck is supple with no masses. It is nontender. No cervical lymphadenopathy palpated.  RESPIRATORY: Clear to auscultation bilaterally without rales, rhonchi, or wheezes. Her breathing is not labored. She is not breathing with any increased effort.  CARDIOVASCULAR: Regular rate. No murmur, rubs, or gallops. S1, S2. The patient has good distal pulses without any lower extremity edema.  ABDOMEN: Soft. It is diffusely mildly tender with focal acute severe tenderness to palpation in the right lower quadrant as well as the left upper and lower quadrants. There is no organomegaly. The patient has hypoactive bowel sounds.  MUSCULOSKELETAL: The patient has no edema, clubbing, or cyanosis. No edema in her joints.  SKIN: Warm and dry without rashes or lesions.  NEUROLOGIC: Cranial nerves II through XII grossly intact. Sensation intact throughout. The patient has no focal deficits, no dysarthria, no aphasia. Strength equal throughout.  PSYCHIATRIC: The patient is alert, oriented x 3. Shows good judgment with moderate insight. She does not display anxious or depressed mood or affect.  PERTINENT LABS: Include white blood count 16, hemoglobin 12.2, platelets 183,000. Sodium 143, potassium 4.3, chloride 112, CO2 of 24, creatinine 1.69, BUN 39, glucose 142. Alkaline phosphatase 69, AST 22, ALT also 22, calcium of 8.9, INR of 1.  PERTINENT RADIOGRAPHIC FINDINGS:  CT, abdomen and pelvis, showed abnormal thickening of the colonic wall in the distal transverse colon, splenic flexure of the colon, and descending colon. Infectious colitis or ischemic colitis not excluded per radiology report.   ASSESSMENT AND PLAN: This is an 81 year old female who presented to the Emergency Department with 1-day acute abdominal pain with nausea, vomiting, diarrhea, and blood in her stool. CT, abdomen and  pelvis, in the Emergency Department showed colitis. 1.  Colitis. It is unclear at this time if it is ischemic or infectious. However, with chills at home, the acuity of onset, and elevated white blood cell count, it favors infectious etiology at this time. She was given intravenous Cipro and Flagyl in the Emergency Department. We will continue this on the floor, along with bowel rest. We will check a stool culture since she had blood in her diarrhea. We will also check a Clostridium difficile even though she has no history of recent antibiotic use. We will also check a fecal lactoferrin. We will put her on proton pump inhibitor b.i.d. and monitor her hemoglobin and white blood cell count. 2.  Hypertension. This is stable. We will continue her home dose of lisinopril.  3.  Anxiety. This is stable at this time as well. We will continue her home dose of paroxetine to avoid withdrawal syndrome. 4.  Hyperlipidemia. We will hold her lovastatin for now as the patient is on bowel rest. 5.  Deep venous thrombosis prophylaxis includes mechanical prophylaxis with sequential compression devices. Given the blood in her stool, we will not give her any chemical anticoagulation at this time.   CODE STATUS: Per my conversation with the patient, she desires to be DNR.  TOTAL TIME SPENT ON THIS ADMISSION: 50 minutes.   ____________________________ Wilford Corner. Jannifer Franklin, MD dfw:ST D: 04/17/2014 19:37:42 ET T: 04/17/2014 22:23:47 ET JOB#: [id number]  cc: Wilford Corner. Jannifer Franklin, MD, <Dictator> Raziya Aveni Fawn Kirk MD ELECTRONICALLY SIGNED 04/18/2014 10:52

## 2014-08-14 NOTE — Consult Note (Signed)
Chief Complaint:  Subjective/Chief Complaint Feels better. No abdominal pain today. No bleeding. Tolerating clears. Hungry. Wants more food.   VITAL SIGNS/ANCILLARY NOTES: **Vital Signs.:   29-Dec-15 06:28  Vital Signs Type Routine  Temperature Temperature (F) 98.3  Celsius 36.8  Temperature Source oral  Pulse Pulse 65  Respirations Respirations 18  Systolic BP Systolic BP 771  Diastolic BP (mmHg) Diastolic BP (mmHg) 66  Mean BP 86  Pulse Ox % Pulse Ox % 96  Pulse Ox Activity Level  At rest  Oxygen Delivery Room Air/ 21 %   Brief Assessment:  GEN no acute distress   Cardiac Regular   Respiratory clear BS   Gastrointestinal Normal   Lab Results: Routine Chem:  28-Dec-15 05:28   Glucose, Serum 97  BUN 18  Creatinine (comp) 1.06  Sodium, Serum 142  Potassium, Serum 3.9  Chloride, Serum  111  CO2, Serum 22  Calcium (Total), Serum  8.0  Osmolality (calc) 285  eGFR (African American) >60  eGFR (Non-African American)  53 (eGFR values <4m/min/1.73 m2 may be an indication of chronic kidney disease (CKD). Calculated eGFR, using the MRDR Study equation, is useful in  patients with stable renal function. The eGFR calculation will not be reliable in acutely ill patients when serum creatinine is changing rapidly. It is not useful in patients on dialysis. The eGFR calculation may not be applicable to patients at the low and high extremes of body sizes, pregnant women, and vegetarians.)  Anion Gap 9  Routine Hem:  28-Dec-15 05:28   Platelet Count (CBC)  117  Hemoglobin (CBC)  9.9  WBC (CBC) 7.0  RBC (CBC)  3.09  Hematocrit (CBC)  29.9  MCV 97  MCH 31.9  MCHC 33.0  RDW 13.2  Neutrophil % 75.7  Lymphocyte % 13.0  Monocyte % 7.2  Eosinophil % 3.8  Basophil % 0.3  Neutrophil # 5.3  Lymphocyte #  0.9  Monocyte # 0.5  Eosinophil # 0.3  Basophil # 0.0 (Result(s) reported on 19 Apr 2014 at [facility])   Radiology Results: CT:    26-Dec-15 15:41, CT Abdomen and  Pelvis Without Contrast  CT Abdomen and Pelvis Without Contrast   REASON FOR EXAM:    (1) llq pain; (2) llq pain;    NOTE: Nursing to Give   Oral CT Contrast  COMMENTS:       PROCEDURE: CT  - CT ABDOMEN AND PELVIS W0  - Apr 17 2014  3:41PM     CLINICAL DATA:  Left lower quadrant pain, diarrhea, leukocytosis    EXAM:  CT ABDOMEN AND PELVIS WITHOUT CONTRAST    TECHNIQUE:  Multidetector CT imaging of the abdomen and pelvis was performed  following the standard protocol without IV contrast.  COMPARISON:  12/26/2010    FINDINGS:  There is significant levoscoliosis of the lumbar spine. Extensive  degenerative changes of the lumbar spine. Bilateral pars defect at  L5 level. Mild compression deformity upper endplate of L1 vertebral  body of indeterminate age. No evidence of acute fracture.    Lung bases are unremarkable. Moderate size hiatal hernia measures  5.9 cm. Unenhanced liver shows no biliary ductal dilatation. There  is a cyst in left hepatic lobe measures 3.2 cm. No calcified  gallstones are noted within gallbladder. Again noted infrarenal  abdominal aortic aneurysm measures 3 cm maximum diameter stable from  prior exam.  No hydronephrosis or hydroureter. No nephrolithiasis. No calcified  ureteral calculi pre    Unenhanced pancreas spleen and  adrenal glands are unremarkable.  Unenhanced kidneys aresymmetrical in size. Oral contrast material  was given to the patient. There is no small bowel obstruction.  Contrast material is noted in right colon and transverse colon. No  pericecal inflammation. The terminal ileum is unremarkable.    Markedly tortuous abdominal aorta. Extensive atherosclerotic  calcifications of SMA. Extensive atherosclerotic calcifications of  abdominal aorta.    There is abnormal thickening of colonic wall in distal transverse  colon descending colon. Colonic wall measures 1.1 cm in thickness in  axial image 25 in the region of splenic flexure. There is  small  pericolonic stranding and fluid is noted. Few colonic diverticula  are noted in left colon containing air. There is no definite  evidence of extraluminal contrast material. Findings are consistent  with segmental colitis. This may be ischemic in nature or infectious  in nature. Clinical correlation is necessary. Multiple sigmoid colon  diverticula are noted. There is no thickening of the sigmoid colon  wall.The uterus is atrophic. The urinary bladder is unremarkable.    No free abdominal air.     IMPRESSION:  1. There is abnormal thickening of colonic wall in distal transverse  colon, splenic flexure of the colon and descending colon. Small  amount of pericolonic fluid and stranding is noted especially in  left colon see axial image 33. Findings are consistent with long  segment colitis. Infectious colitis or ischemic colitis cannot be  excluded. Clinical correlation is necessary.  2. No definite evidence of extravasation of oral contrast material.  No small bowel obstruction.  3. No nephrolithiasis.  No hydronephrosis or hydroureter.  4. Moderate size hiatal hernia measures 5.9 cm.  5. There is a cyst in left hepatic lobe measures 3.2 cm.  6. Extensive atherosclerotic calcifications of abdominal aorta and  SMA. Stable infrarenal abdominal aortic aneurysm.  7. There is significant levoscoliosis of the lumbar spine spur.  Degenerative changes lumbar spine. Mild compression deformity L1  vertebral body probable chronic in nature.  8. Multiple sigmoid colon diverticula. No evidence of acute  diverticulitis. No thickening of sigmoid colon wall.  Electronically Signed    By: Lahoma Crocker M.D.    On: 04/17/2014 15:56         Verified By: Ephraim Hamburger, M.D.,   Assessment/Plan:  Assessment/Plan:  Assessment Probable ischemic colitis. Clinically improving.   Plan Advance to full liquid diet. If tolerates, then advance to low residue diet. If tolerates solids, then ok to  discharge by tomorrow. Can continue po Abx on discharge. Have patient f/u with [place] in few weeks. Will set up outpt colonscopy in a month or so to assess healing. Thanks.   Electronic Signatures: Verdie Shire (MD)  (Signed 29-Dec-15 13:50)  Authored: Chief Complaint, VITAL SIGNS/ANCILLARY NOTES, Brief Assessment, Lab Results, Radiology Results, Assessment/Plan   Last Updated: 29-Dec-15 13:50 by Verdie Shire (MD)

## 2014-08-14 NOTE — Consult Note (Signed)
Pt seen and examined. Please see K.Jerelene Redden' notes. Probably has ischemic colitis and not diverticulitis. Agree with Abx. Since patient had a colonoscopy in 2012, no urgency in repeating colonscopy now. However, if symptoms do not improve over next few days, will have to consider at least a flex sig. Will follow. Thanks.  Electronic Signatures: Verdie Shire (MD)  (Signed on 28-Dec-15 18:03)  Authored  Last Updated: 28-Dec-15 18:03 by Verdie Shire (MD)

## 2014-08-15 NOTE — Discharge Summary (Signed)
PATIENT NAME:  Anna Prince, Anna Prince MR#:  [id number] DATE OF BIRTH:  1931-06-02  DATE OF ADMISSION:  06/25/2011 DATE OF DISCHARGE:  06/27/2011  ADMITTING DIAGNOSIS: Syncope.   DISCHARGE DIAGNOSES:  1. Syncope due to hypotension in the setting of acute gastroenteritis.  2. Acute gastroenteritis was very brief, now resolved.  3. Likely urinary tract infection with urine cultures greater than 100,000 Escherichia coli, on p.o. Cipro.  4. History of hypertension. The patient's blood pressure started increasing, now in the 160s.  We will give her one-fourth of her lisinopril dose and she will keep a log of blood pressure to take to primary M.D.  5. Hyperlipidemia.  6. History of abdominal aortic aneurysm repair.  7. Status post two back surgeries.  8. Macular degeneration.  9. Osteoarthritis.  10. History of Barrett's esophagus.  11. History of acute diverticulitis.  12. Chronic obstructive pulmonary disease with ongoing tobacco abuse. 13. Chronic anemia according to the patient with history of B12 deficiency on B12 shots    PERTINENT LABS/STUDIES: WBC 10.1, hemoglobin 12.4, platelet count is 190. With IV hydration hemoglobin was 9.9. BMP: Glucose 131, BUN 19, creatinine 1.29, sodium 141, potassium 4.2, chloride 105, CO2 23. Her creatinine today is 1. LFTs were normal. Troponin less than 0.02. Urine culture showed greater than 100,000 gram-negative rods. Urinalysis: 3+ leukocytes, 309 WBCs. EKG showed sinus rhythm with a rate of 64 with PVCs, nonspecific ST changes, slightly prolonged QTc of 480. CT scan of the head without contrast showed no acute abnormality.   CONSULTANTS: None.  HOSPITAL COURSE: Please see the History and Physical done by the admitting physician. The patient is a 81 year old white female who on the day of admission in the morning did not feel well, started to have diarrhea. She had 2 to 3 episodes of emesis and then subsequently had multiple episodes of loose bowel movements.  Early in the day the patient had taken her antihypertensives. She started feeling very weak and then was sitting on the commode when she passed out. When EMS got there her blood pressure was 79/50 on arrival. Due to that she was brought to the ED. The patient was felt to have syncope in light of having hypotension which was felt to be due to acute gastroenteritis as well as concurrent blood pressure medication ingestion. The patient was noted to be orthostatic. She was aggressively hydrated. With these treatments, her orthostatic blood pressure changes resolved. She also was noted to have a urinary tract infection and has been started on Cipro for that. She had no arrhythmias on telemetry. Her cardiac enzymes remained negative. Due to this being felt to be purely syncope related to hypotension, echocardiogram or carotid Dopplers were not done. The patient at this point is doing much better and is feeling well and is stable for discharge.   DISCHARGE MEDICATIONS:  1. Omeprazole 40 mg daily.  2. Mirtazapine 15 mg, 1/2 tab at bedtime.  3. Lovastatin 40 mg at bedtime.  4. Diazepam 5 mg, 1/2 tab at bedtime.  5. Acetaminophen oxycodone 325/5 mg, 1 tab p.o. q. 6 p.r.n.  6. Cipro 500 p.o. q. 12 hours times four days.  7. Lisinopril 10 p.o. daily which she is to start tomorrow.   DIET: Low sodium.   ACTIVITY: As tolerated.   FOLLOWUP: Follow up with Dr. Derrel Nip in 1 to 2 weeks. The patient is to keep a log of her blood pressure twice daily to take to Dr. Derrel Nip next week.   TIME SPENT:  35 minutes.  ____________________________ Lafonda Mosses Posey Pronto, MD shp:bjt D: 06/27/2011 14:08:39 ET T: 06/28/2011 10:20:43 ET JOB#: [id number]  cc: Deborra Medina, MD Alric Seton MD ELECTRONICALLY SIGNED 06/29/2011 7:55

## 2014-08-15 NOTE — H&P (Signed)
PATIENT NAME:  Anna Prince, Anna Prince MR#:  [id number] DATE OF BIRTH:  1931/05/14  DATE OF ADMISSION:  06/25/2011  PRIMARY CARE PHYSICIAN: Dr. Derrel Nip  CHIEF COMPLAINT: "I passed out. I was feeling weak this morning."   HISTORY OF PRESENT ILLNESS: A 81 year old female who has history of hypertension, hyperlipidemia, she had previous history of abdominal aortic aneurysm repair, Barrett's esophagus, macular degeneration, osteoarthritis. She was last admitted in September 2012 because of diverticulitis. Today she was brought in by EMS because she had a syncopal episode at home. Her blood pressure per EMS was 79/50 on arrival. Today this morning she had about 2 to 3 bowel movements which were semi-formed liquid bowel movements. She was not feeling good this morning. She was feeling weak but she denies any dizziness or lightheadedness at this time. Then she went outside. She went to [employer] and then came home. After she came home she again was not feeling great and then she had 2 to 3 vomitings and then she had one very large bowel movement which was watery and after that she did not know what happened. She passed out in the bathroom. She says "I wasn't passed out for long" so she passed out for a few minutes. When EMS arrived her blood pressure was around 70 systolic. She never had this passing out spell in the past. She denies any abdominal pain. She denies any GI bleed. No shortness of breath. No chest pain. No headache or any change in her vision. She said she had some mild chest pain about a week ago but she was not having any problems for last one week. She did not feel bad yesterday. She denies eating outside yesterday. In the Emergency Room she had a CT of the head done that was negative. She got about 2 liters of normal saline bolus and she was orthostatic in the Emergency Room so hospitalist was asked to admit the patient because of syncope most likely secondary to hypotension, may be secondary to volume  depletion with nausea and diarrhea.   REVIEW OF SYSTEMS: Positive for weakness but no fever. No acute change in vision. No headache. No dizziness. She had a syncopal episode. No cough. No chest pain. No dyspnea on exertion. She had some vomiting and diarrhea but no abdominal pain. No GI bleed. No dysuria. No frequency. No thyroid problems. No anemia. No rash. She has significant back pain and hip pain. No focal numbness or weakness. No anxiety.   PAST MEDICAL HISTORY:  1. Hypertension. 2. Hyperlipidemia. 3. History of abdominal aortic aneurysm repair. 4. Status post back surgery x2.  5. Macular degeneration. 6. Osteoarthritis. 7. Barrett's esophagus.  8. She was admitted in September 2012 because of acute diverticulitis which was her second episode. 9. Chronic obstructive pulmonary disease with ongoing tobacco abuse.   ALLERGIES TO MEDICATIONS: Morphine.   HOME MEDICATIONS:  1. Acetaminophen oxycodone 5/325, 1 tablet she might take b.i.d. as needed.  2. Diazepam 2.5 mg at bedtime.  3. Lisinopril 40 mg in the morning.  4. Lovastatin 40 mg at night. 5. Mirtazapine 7.5 mg at bedtime.  6. Omeprazole 40 mg daily.  7. She was recently started on Symbicort.   SOCIAL HISTORY: She lives with her daughter. She smokes about 1/2 pack per day. No alcohol or drug use.   FAMILY HISTORY: Father had myocardial infarction. Mother had dementia.   PHYSICAL EXAMINATION:  VITAL SIGNS: Her vitals as per EMS: Blood pressure 70/50 and when she came to the  Emergency Room heart rate 57, respiratory rate 18, blood pressure 104/57, saturating 96% on room air. She had orthostatic vitals done here which showed lying blood pressure 115/64 and standing blood pressure 94/64. Currently her blood pressure is 125/68 with a heart rate of 80.   GENERAL: This is an elderly Caucasian female, thin built, she is comfortably lying in bed, no acute distress, mainly complaining of back pain.   HEENT: Bilateral pupils are  equal. Extraocular muscles are intact. No scleral icterus. No conjunctivitis. Oral mucosa slightly dry.   NECK: No thyroid tenderness, enlargement or nodules. Neck is supple. No masses, nontender. No adenopathy. No JVD. No carotid bruit.   CHEST: Bilateral breath sounds are clear but diminished. No wheezing. Normal effort. No respiratory distress.   HEART: Heart sounds are regular. No murmur. Good peripheral pulses. No lower extremity edema.   ABDOMEN: Soft, nontender. Normal bowel sounds. No hepatosplenomegaly. No bruits. No masses.   RECTAL: Deferred.   NEUROLOGIC: She is awake, alert, oriented to time, place, and person. Cranial nerves are intact. Moving all extremities against gravity.   EXTREMITIES: No cyanosis. No clubbing.   SKIN: No rash. No lesions. May be poor skin turgor.   LABORATORY, DIAGNOSTIC AND RADIOLOGICAL DATA: White count 10.1, hemoglobin 12.4, platelet count 190,000. BMP: Sodium 141, potassium 4.2, BUN 19, creatinine 1.29. LFTs are normal. Troponin negative. CT head is negative. EKG shows sinus rhythm but no acute ischemic changes.   IMPRESSION:  1. Syncope, most likely secondary to hypotension secondary to volume depletion. 2. Vomiting and diarrhea, suspect gastroenteritis. 3. Orthostatic hypotension with history of hypertension. 4. Chronic obstructive pulmonary disease, stable. 5. History of peripheral vascular disease. 6. Chronic back.   PLAN: A 81 year old female with history of hypertension, hyperlipidemia, history of abdominal aortic aneurysm repair, chronic back pain with back surgery in the past, previous history of diverticulitis she comes in with a syncopal episode. Her blood pressure when EMS arrived was 70/50. Syncope most likely related to hypotension in the setting of volume depletion. She had about three diarrhea stools in the morning and she then she had a very large bowel movement, also 2 to 3 episodes of vomiting today. Her white count is normal,  most likely viral gastroenteritis. Her BUN is slightly elevated at 19. She was orthostatic in the Emergency Room. I am going to give her IV hydration. Hold her lisinopril at this time. Her EKG does not show any acute ischemia but I will monitor her on telemetry and also do serial cardiac enzymes on her but I doubt this is cardiac. Will continue to check her orthostatics. Will continue her mirtazapine and diazepam and also Percocet p.r.n. for her pain. If her hydration has improved and negative orthostatic possibly she can be discharged tomorrow.  TIME SPENT WITH ADMISSION AND COORDINATION: 40 minutes.   ____________________________ Mena Pauls, MD ag:cms D: 06/25/2011 19:57:00 ET T: 06/26/2011 06:25:56 ET JOB#: [id number]  cc: Mena Pauls, MD, <Dictator> Deborra Medina, MD Mena Pauls MD ELECTRONICALLY SIGNED 07/13/2011 11:48

## 2014-08-21 ENCOUNTER — Other Ambulatory Visit: Payer: Self-pay | Admitting: Internal Medicine

## 2014-08-21 DIAGNOSIS — R5383 Other fatigue: Secondary | ICD-10-CM

## 2014-08-21 DIAGNOSIS — F32A Depression, unspecified: Secondary | ICD-10-CM

## 2014-08-21 DIAGNOSIS — F329 Major depressive disorder, single episode, unspecified: Secondary | ICD-10-CM

## 2014-08-21 DIAGNOSIS — E785 Hyperlipidemia, unspecified: Secondary | ICD-10-CM

## 2014-08-21 DIAGNOSIS — E559 Vitamin D deficiency, unspecified: Secondary | ICD-10-CM

## 2014-08-22 NOTE — Discharge Summary (Signed)
PATIENT NAME:  Anna Prince, Anna Prince MR#:  [id number] DATE OF BIRTH:  11/21/1931  DATE OF ADMISSION:  04/17/2014 DATE OF DISCHARGE:  04/21/2014  ADMITTING DIAGNOSIS: Colitis.   DISCHARGE DIAGNOSES: 1.  Ischemic colitis with bleeding.  2.  Acute posthemorrhagic anemia due to gastrointestinal bleeding.  3.  Acute renal failure due to acute tubular necrosis and relative hypotension.  4.  Mild chronic obstructive pulmonary disease exacerbation.   DISCHARGE CONDITION: Stable.   DISCHARGE MEDICATIONS: The patient is to continue:  1.  Paroxetine 10 mg p.o. daily.  2.  Lovastatin 40 mg p.o. daily.  3.  Diazepam 5 mg p.o. twice daily as needed.  4.  Remeron 30 mg p.o. at bedtime.  5.  Lisinopril 5 mg p.o. daily.  6.  Omeprazole 40 mg p.o. twice daily.  7.  Iron gluconate 240 mg p.o. twice daily.  8.  Flagyl 500 mg p.o. every 8 hours.  9.  Budesonide formoterol 2 puffs twice daily.  10.  Tiotropium 18 mcg inhalation daily.  11.  Combivent Respimat 1 puff 4 times daily as needed.  12.  Ciprofloxacin 500 mg p.o. every 12 hours to complete course.   HOME OXYGEN: None.   DIET: Low-salt, low-fat, low-cholesterol mechanical soft.   ACTIVITY LIMITATIONS: As tolerated.   FOLLOWUP: Appointment with Dr. Derrel Nip in 2 days after discharge. Also followup appointment with Dr. Candace Cruise in 1 week after discharge.   CONSULTANTS: Care management, social work, Dr. Candace Cruise, nurse practitioner Ms. Denice Paradise.   RADIOLOGIC STUDIES: CT scan of abdomen and pelvis without contrast on April 17, 2014, revealing abnormal thickening of colonic wall in distal transverse colon, splenic flexure of the colon, and descending colon. Small amount of pericolonic fluid and stranding was noted, especially in the left colon. See axial image 33. Findings are consistent with long segmental colitis. Infectious colitis or ischemic colitis cannot be excluded. Clinical correlation was necessary. No definite evidence of extravasation of oral  contrast material, no small bowel obstruction, no nephrolithiasis, no hydronephrosis, or hydroureter. Moderate sized hiatal hernia measuring 5.9 cm. There is a cyst in the left hepatic lobe measuring 3.2 cm. Extensive atherosclerotic calcifications of abdominal aorta and SMA. Stable infrarenal abdominal aortic aneurysm. There is significant levoscoliosis with lumbar spine spur. Degenerative changes of lumbar spine. Mild compression deformity in L1 vertebral body, probable chronic in nature. Multiple sigmoid colon diverticula. No evidence of acute diverticulitis. No thickening of sigmoid colon wall. Chest x-ray PA and lateral, performed  April 20, 2014, showing no acute findings. Hyperinflation and coarsened interstitial findings suggestive for COPD, atherosclerotic disease.   HOSPITAL COURSE: The patient is an 81 year old female with past medical history significant for history of diverticulosis, abdominal aortic aneurysm, chronic scoliosis, hypertension, anxiety, as well as hyperlipidemia, who presented to the hospital with complaints of abdominal pain and blood in stools. Please refer to Dr. Jannifer Franklin' admission note on April 17, 2014. On arrival to the hospital, the patient's temperature was 99.3, pulse was 92, respiration rate was 18, blood pressure 107/65, saturation was 91% on room air. Physical exam revealed mild tenderness diffusely on abdominal examination and severe tenderness to palpation of right lower quadrant, as well as left upper quadrant, as well as left lower quadrant, but no organomegaly noted. The patient did have hypoactive bowel sounds. The patient's lab data done on arrival to the hospital revealed elevation of BUN and creatinine to 39 and 1.69 on April 17, 2014. Glucose level of 142, otherwise BMP was unremarkable. The patient's liver  enzymes were remarkable for albumin level of 3.3 and total protein of 6.1. White blood cells count was elevated to 16.1, hemoglobin was 12.2, platelet  count was 183,000. Antibody screen was negative. Clostridium difficile test was also negative. Coagulation panel was unremarkable. Urinalysis was remarkable for 8 red blood cells, 23 white blood cells, 3+ bacteria, 3+ leukocyte esterase, but negative for protein as well as nitrites. The patient's lactoferrin level was elevated at 38.8. Occult blood in feces test was positive. EKG showed normal sinus rhythm at 69 beats per minute, normal axis. No acute ST-T changes were noted. CT scan of abdomen revealed colitis. Consultation with Dr. Candace Cruise was obtained. Dr. Candace Cruise followed the patient in consult while she was in the hospital. Her diet was slowly advanced. Her bleeding stopped. On the day of discharge, the patient's vital signs were temperature of 98.6, pulse was 59 to 75, respiration was 18, blood pressure 137/81, saturation 95%  to 97% on room air at rest. The patient was advised to continue a soft diet, to be advanced as tolerated to regular.  In regards to gastrointestinal bleeding, the patient's hemoglobin level was followed while she was in the hospital and with rehydration. On the day of admission, the patient's hemoglobin level was 12.2. With rehydration, the patient's hemoglobin level was lower,  9.8 on the day of discharge, April 21, 2014. It is recommended to follow the patient's hemoglobin level as outpatient and make decisions about initiation on iron supplementation if needed.   In regards to acute renal failure, it was felt to be due to ATN, as well as relative hypotension. With rehydration, the patient's kidney function normalized. With therapy, the patient was noted to be short of breath requiring inhalation therapy, which was felt to be due to COPD exacerbation. The patient was advised to continue antibiotic therapy, as well as inhalation therapy as outpatient to ensure stabilization. Her oxygenation remained stable and her lung exam overall improved as time progressed, although she still had some  cough and some wheezing intermittently.   She is being discharged in stable condition with the above-mentioned medications and followup. Her vitals were stable. Temperature was as mentioned above 98.6, pulse was 59 to 75, respiration rate was 16 to 18, blood pressure 485 to 462 systolic and 70J to 50K diastolic, O2 saturations were 95% to 97% on room air at rest.   TIME SPENT: 40 minutes.    ____________________________ Theodoro Grist, MD rv:ts D: 04/26/2014 16:57:50 ET T: 04/27/2014 01:47:20 ET JOB#: [id number]  cc: Theodoro Grist, MD, <Dictator> Deborra Medina, MD Lupita Dawn. Candace Cruise, MD  Theodoro Grist MD ELECTRONICALLY SIGNED 05/06/2014 17:17

## 2014-08-23 NOTE — Telephone Encounter (Signed)
Last Ov 10.29.15.  Please advise refills

## 2014-08-23 NOTE — Telephone Encounter (Signed)
Pt scheduled fasting labs on 5.3.16 at 10:15

## 2014-08-23 NOTE — Telephone Encounter (Signed)
Faxed to pharmacy

## 2014-08-23 NOTE — Telephone Encounter (Signed)
She is overdue for 6 month follow up .  You can fill  30 days only on all meds but she must get  fasting labs prior to visit.

## 2014-08-24 ENCOUNTER — Other Ambulatory Visit (INDEPENDENT_AMBULATORY_CARE_PROVIDER_SITE_OTHER): Payer: Medicare Other

## 2014-08-24 DIAGNOSIS — E559 Vitamin D deficiency, unspecified: Secondary | ICD-10-CM

## 2014-08-24 DIAGNOSIS — F329 Major depressive disorder, single episode, unspecified: Secondary | ICD-10-CM | POA: Diagnosis not present

## 2014-08-24 DIAGNOSIS — E785 Hyperlipidemia, unspecified: Secondary | ICD-10-CM

## 2014-08-24 DIAGNOSIS — F32A Depression, unspecified: Secondary | ICD-10-CM

## 2014-08-24 DIAGNOSIS — R5383 Other fatigue: Secondary | ICD-10-CM

## 2014-08-24 LAB — COMPREHENSIVE METABOLIC PANEL
ALBUMIN: 3.8 g/dL (ref 3.5–5.2)
ALK PHOS: 176 U/L — AB (ref 39–117)
ALT: 56 U/L — AB (ref 0–35)
AST: 52 U/L — AB (ref 0–37)
BUN: 18 mg/dL (ref 6–23)
CHLORIDE: 104 meq/L (ref 96–112)
CO2: 28 mEq/L (ref 19–32)
Calcium: 9.6 mg/dL (ref 8.4–10.5)
Creatinine, Ser: 1.05 mg/dL (ref 0.40–1.20)
GFR: 53.52 mL/min — ABNORMAL LOW (ref >60.00)
Glucose, Bld: 103 mg/dL — ABNORMAL HIGH (ref 70–99)
Potassium: 4.5 mEq/L (ref 3.5–5.1)
Sodium: 139 mEq/L (ref 135–145)
Total Bilirubin: 0.4 mg/dL (ref 0.2–1.2)
Total Protein: 6.8 g/dL (ref 6.0–8.3)

## 2014-08-24 LAB — LIPID PANEL
Cholesterol: 169 mg/dL (ref 0–200)
HDL: 66.1 mg/dL (ref >39.00)
LDL CALC: 85 mg/dL (ref 0–99)
NonHDL: 102.9
TRIGLYCERIDES: 91 mg/dL (ref 0.0–149.0)
Total CHOL/HDL Ratio: 3
VLDL: 18.2 mg/dL (ref 0.0–40.0)

## 2014-08-24 LAB — CBC WITH DIFFERENTIAL/PLATELET
BASOS ABS: 0 10*3/uL (ref 0.0–0.1)
Basophils Relative: 0.2 % (ref 0.0–3.0)
EOS PCT: 1.5 % (ref 0.0–5.0)
Eosinophils Absolute: 0.1 10*3/uL (ref 0.0–0.7)
HCT: 36.3 % (ref 36.0–46.0)
HEMOGLOBIN: 12.1 g/dL (ref 12.0–15.0)
LYMPHS ABS: 0.8 10*3/uL (ref 0.7–4.0)
LYMPHS PCT: 11.1 % — AB (ref 12.0–46.0)
MCHC: 33.3 g/dL (ref 30.0–36.0)
MCV: 93.3 fl (ref 78.0–100.0)
MONOS PCT: 6.7 % (ref 3.0–12.0)
Monocytes Absolute: 0.5 10*3/uL (ref 0.1–1.0)
NEUTROS ABS: 5.6 10*3/uL (ref 1.4–7.7)
Neutrophils Relative %: 80.5 % — ABNORMAL HIGH (ref 43.0–77.0)
PLATELETS: 180 10*3/uL (ref 150.0–400.0)
RBC: 3.89 Mil/uL (ref 3.87–5.11)
RDW: 14.2 % (ref 11.5–15.5)
WBC: 7 10*3/uL (ref 4.0–10.5)

## 2014-08-24 LAB — TSH: TSH: 0.54 u[IU]/mL (ref 0.35–4.50)

## 2014-08-24 LAB — VITAMIN D 25 HYDROXY (VIT D DEFICIENCY, FRACTURES): VITD: 66.9 ng/mL (ref 30.00–100.00)

## 2014-08-25 ENCOUNTER — Ambulatory Visit
Admission: RE | Admit: 2014-08-25 | Discharge: 2014-08-25 | Disposition: A | Discharge status: Home / Self Care | Payer: Medicare Other | Source: Ambulatory Visit | Attending: Internal Medicine | Admitting: Internal Medicine

## 2014-08-25 ENCOUNTER — Encounter: Payer: Self-pay | Admitting: Internal Medicine

## 2014-08-25 ENCOUNTER — Ambulatory Visit (INDEPENDENT_AMBULATORY_CARE_PROVIDER_SITE_OTHER): Payer: Medicare Other | Admitting: Internal Medicine

## 2014-08-25 VITALS — BP 118/62 | HR 71 | Temp 98.0°F | Resp 16 | Ht 64.0 in | Wt 128.8 lb

## 2014-08-25 DIAGNOSIS — M25552 Pain in left hip: Secondary | ICD-10-CM | POA: Diagnosis present

## 2014-08-25 DIAGNOSIS — I10 Essential (primary) hypertension: Secondary | ICD-10-CM

## 2014-08-25 DIAGNOSIS — G8929 Other chronic pain: Secondary | ICD-10-CM

## 2014-08-25 DIAGNOSIS — M810 Age-related osteoporosis without current pathological fracture: Secondary | ICD-10-CM

## 2014-08-25 DIAGNOSIS — R748 Abnormal levels of other serum enzymes: Secondary | ICD-10-CM | POA: Diagnosis not present

## 2014-08-25 DIAGNOSIS — G894 Chronic pain syndrome: Secondary | ICD-10-CM

## 2014-08-25 DIAGNOSIS — K5730 Diverticulosis of large intestine without perforation or abscess without bleeding: Secondary | ICD-10-CM

## 2014-08-25 DIAGNOSIS — M419 Scoliosis, unspecified: Secondary | ICD-10-CM | POA: Insufficient documentation

## 2014-08-25 DIAGNOSIS — R5383 Other fatigue: Secondary | ICD-10-CM

## 2014-08-25 DIAGNOSIS — Z13820 Encounter for screening for osteoporosis: Secondary | ICD-10-CM

## 2014-08-25 DIAGNOSIS — Z1211 Encounter for screening for malignant neoplasm of colon: Secondary | ICD-10-CM

## 2014-08-25 DIAGNOSIS — S32502S Unspecified fracture of left pubis, sequela: Secondary | ICD-10-CM

## 2014-08-25 DIAGNOSIS — M8588 Other specified disorders of bone density and structure, other site: Secondary | ICD-10-CM | POA: Diagnosis not present

## 2014-08-25 DIAGNOSIS — Z8781 Personal history of (healed) traumatic fracture: Secondary | ICD-10-CM | POA: Insufficient documentation

## 2014-08-25 DIAGNOSIS — F0150 Vascular dementia without behavioral disturbance: Secondary | ICD-10-CM

## 2014-08-25 DIAGNOSIS — S32592S Other specified fracture of left pubis, sequela: Secondary | ICD-10-CM

## 2014-08-25 MED ORDER — MELOXICAM 15 MG PO TABS: 15.0000 mg | ORAL_TABLET | Freq: Every day | ORAL | Status: DC

## 2014-08-25 MED ORDER — HYDROCODONE-ACETAMINOPHEN 5-325 MG PO TABS: ORAL_TABLET | ORAL | Status: DC

## 2014-08-25 MED ORDER — MIRTAZAPINE 30 MG PO TABS: 30.0000 mg | ORAL_TABLET | Freq: Every day | ORAL | Status: DC

## 2014-08-25 MED ORDER — PAROXETINE HCL 20 MG PO TABS: 20.0000 mg | ORAL_TABLET | Freq: Every day | ORAL | Status: DC

## 2014-08-25 NOTE — Patient Instructions (Addendum)
Your liver enzymes  are elevated.  This may be from the tylenol,  Please stop all products containing acetominophen (except the vicodin) and returrn in one week for liver enzymes to be rechecked.   You will need to start taking daily Senna s  Or dulcolax  If you resume daily vicodin .  Only suspend if stools become too loose.  Stop the stool softneer  Plain hip films of the  left  Hip today at [facility]    You have osteoporosis and fractured your pelvis (pubic ramus) in 2013 according to [facility] notes  Please resume fosomax once a week   I recommend getting the majority of your calcium and Vitamin D  through diet rather than supplements given the recent association of calcium supplements with increased coronary artery calcium scores.  You need 1800 mg daily of calcium.  Your vitamin D level is normal ,  Continue your current Vitamin D   Try the Silk almond Light,  Original formula.  Delicious,  Low carb,  Low cal,  Cholesterol free for the calcium supplementation
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
## 2014-08-25 NOTE — Assessment & Plan Note (Signed)
Patient has been in constant pain since her narcotics were discontinued and would like to resume.  She has DDD lower spine and she s not a candidate for surgery. Patient now has a lock box

## 2014-08-28 ENCOUNTER — Encounter: Payer: Self-pay | Admitting: Internal Medicine

## 2014-08-28 NOTE — Assessment & Plan Note (Signed)
May be due to overuse of tylenol .  Advised to stop OTC acetaminophen ,  Resume vicodin for pain control,  repeat in one week

## 2014-08-28 NOTE — Assessment & Plan Note (Signed)
With pubic ramus fracture noted on 2013 films done by [facility].  Hip films was done today due t persistent hip pain and showed only osteopenia.  Discussed therapy.

## 2014-08-28 NOTE — Assessment & Plan Note (Signed)
Her back pain has been aggravated b a pubic ramus fracture that occurred recenlty,  Images reviewed with patient and daughter

## 2014-08-28 NOTE — Assessment & Plan Note (Signed)
BP 118/62 mmHg  Pulse 71  Temp(Src) 98 F (36.7 C) (Oral)  Resp 16  Ht 5\' 4"  (1.626 m)  Wt 128 lb 12 oz (58.401 kg)  BMI 22.09 kg/m2  SpO2 97%  Well controlled on current regimen. Renal function stable, no changes today.  Lab Results  Component Value Date   CREATININE 1.05 08/24/2014   Lab Results  Component Value Date   NA 139 08/24/2014   K 4.5 08/24/2014   CL 104 08/24/2014   CO2 28 08/24/2014

## 2014-08-28 NOTE — Assessment & Plan Note (Signed)
With current complaints of constipation.  Bowel regimen discussed.

## 2014-08-28 NOTE — Assessment & Plan Note (Signed)
Colonoscopy was done by Dr. Candace Cruise recenrlyntly.  Prep was inadequate.

## 2014-08-28 NOTE — Assessment & Plan Note (Addendum)
Stable,  With some depression and weight loss noted over the last year.  Her  medications managed by daughter who lives with her.  Continue paxil for depressiion and remeron for poor appetite.  Wt Readings from Last 3 Encounters:  08/25/14 128 lb 12 oz (58.401 kg)  02/18/14 131 lb 12 oz (59.761 kg)  09/28/13 129 lb 8 oz (58.741 kg)

## 2014-09-01 ENCOUNTER — Other Ambulatory Visit: Payer: Self-pay | Admitting: Internal Medicine

## 2014-09-01 ENCOUNTER — Other Ambulatory Visit (INDEPENDENT_AMBULATORY_CARE_PROVIDER_SITE_OTHER): Payer: Medicare Other

## 2014-09-01 DIAGNOSIS — R5383 Other fatigue: Secondary | ICD-10-CM | POA: Diagnosis not present

## 2014-09-01 DIAGNOSIS — R748 Abnormal levels of other serum enzymes: Secondary | ICD-10-CM | POA: Diagnosis not present

## 2014-09-01 DIAGNOSIS — Z79899 Other long term (current) drug therapy: Secondary | ICD-10-CM

## 2014-09-01 LAB — COMPREHENSIVE METABOLIC PANEL
ALT: 32 U/L (ref 0–35)
AST: 33 U/L (ref 0–37)
Albumin: 3.6 g/dL (ref 3.5–5.2)
Alkaline Phosphatase: 166 U/L — ABNORMAL HIGH (ref 39–117)
BILIRUBIN TOTAL: 0.3 mg/dL (ref 0.2–1.2)
BUN: 25 mg/dL — ABNORMAL HIGH (ref 6–23)
CALCIUM: 9.5 mg/dL (ref 8.4–10.5)
CHLORIDE: 102 meq/L (ref 96–112)
CO2: 26 mEq/L (ref 19–32)
Creatinine, Ser: 1.1 mg/dL (ref 0.40–1.20)
GFR: 50.72 mL/min — AB (ref >60.00)
Glucose, Bld: 89 mg/dL (ref 70–99)
Potassium: 5 mEq/L (ref 3.5–5.1)
Sodium: 134 mEq/L — ABNORMAL LOW (ref 135–145)
TOTAL PROTEIN: 7.1 g/dL (ref 6.0–8.3)

## 2014-09-01 LAB — VITAMIN B12: VITAMIN B 12: 490 pg/mL (ref 211–911)

## 2014-09-01 MED ORDER — ALBUTEROL SULFATE HFA 108 (90 BASE) MCG/ACT IN AERS: 2.0000 | INHALATION_SPRAY | Freq: Four times a day (QID) | RESPIRATORY_TRACT | Status: DC | PRN

## 2014-09-02 LAB — FOLATE RBC: RBC FOLATE: 1099 ng/mL (ref >280)

## 2014-09-06 ENCOUNTER — Encounter: Payer: Self-pay | Admitting: *Deleted

## 2014-09-09 ENCOUNTER — Other Ambulatory Visit: Payer: Medicare Other

## 2014-09-09 NOTE — Patient Instructions (Signed)
Pt didn't need labs done per [facility]

## 2014-09-14 ENCOUNTER — Other Ambulatory Visit: Payer: Self-pay | Admitting: Internal Medicine

## 2014-09-17 ENCOUNTER — Other Ambulatory Visit: Payer: Self-pay | Admitting: Internal Medicine

## 2014-09-21 ENCOUNTER — Telehealth: Payer: Self-pay | Admitting: *Deleted

## 2014-09-21 ENCOUNTER — Other Ambulatory Visit: Payer: Self-pay | Admitting: Internal Medicine

## 2014-09-21 DIAGNOSIS — G894 Chronic pain syndrome: Secondary | ICD-10-CM

## 2014-09-21 MED ORDER — HYDROCODONE-ACETAMINOPHEN 5-325 MG PO TABS: ORAL_TABLET | ORAL | Status: DC

## 2014-09-21 NOTE — Telephone Encounter (Signed)
pts daughter called requesting Hydrocodone refill.  Last refill and OV 5.4.16.  Please advise refill

## 2014-09-21 NOTE — Telephone Encounter (Signed)
Last visit 08/25/14, ok refill? Last refill 08/23/14 #60

## 2014-09-21 NOTE — Telephone Encounter (Signed)
Spoke with pt, made aware Rx ready for pick up

## 2014-09-21 NOTE — Telephone Encounter (Signed)
Ok to refill,  printed rx  

## 2014-09-22 NOTE — Telephone Encounter (Signed)
Rx phoned into pharmacy.

## 2014-09-22 NOTE — Telephone Encounter (Signed)
Ok to refill,  Authorized in epic please phone in

## 2014-10-14 ENCOUNTER — Other Ambulatory Visit: Payer: Self-pay | Admitting: Internal Medicine

## 2014-10-14 NOTE — Telephone Encounter (Signed)
Vit D was normal.  Mega dose no longer needed,  Should be taking 2000 IUs daily of OTC Vit D3

## 2014-10-14 NOTE — Telephone Encounter (Signed)
Refill? Last vitamin D 08/24/14

## 2014-10-15 ENCOUNTER — Telehealth: Payer: Self-pay | Admitting: *Deleted

## 2014-10-15 DIAGNOSIS — G894 Chronic pain syndrome: Secondary | ICD-10-CM

## 2014-10-15 NOTE — Telephone Encounter (Signed)
Spoke with pt advised of MDs message.  Pt verbalized understanding. 

## 2014-10-15 NOTE — Telephone Encounter (Signed)
Pt called requesting Hydrocodone refill.  Last OV 5.4.16, last refill 6.4.16.  Pt states she only has 2 tablets left and that she only takes two tablets daily.  Please advise refill.

## 2014-10-15 NOTE — Telephone Encounter (Signed)
My records indicate that She was given #120 for refill on June 4,  If she is only taking 2 daily and has only 2 left, then someone else  is taking them and I will not refill

## 2014-10-26 ENCOUNTER — Other Ambulatory Visit: Payer: Self-pay | Admitting: Internal Medicine

## 2014-10-26 ENCOUNTER — Telehealth: Payer: Self-pay

## 2014-10-26 DIAGNOSIS — G894 Chronic pain syndrome: Secondary | ICD-10-CM

## 2014-10-26 MED ORDER — HYDROCODONE-ACETAMINOPHEN 5-325 MG PO TABS: ORAL_TABLET | ORAL | Status: DC

## 2014-10-26 MED ORDER — TRAMADOL HCL 50 MG PO TABS: 50.0000 mg | ORAL_TABLET | Freq: Two times a day (BID) | ORAL | Status: DC

## 2014-10-26 MED ORDER — HYDROCODONE-ACETAMINOPHEN 5-325 MG PO TABS: 1.0000 | ORAL_TABLET | Freq: Two times a day (BID) | ORAL | Status: DC

## 2014-10-26 NOTE — Telephone Encounter (Signed)
I spoke with the patient.  She understood why you will not refill the prescription.  She believes that she has not taken it appropriately, stated that the bottle said can take every 6 hours as needed.  She knows that there were times where she has most likely taken too much without realizing.  Had discussions with her daughter regarding her medications and the daughter is trying to assist with making sure she is only taking what you had ordered.  She asked if there was something different then just tylenol that she could take to manage her pain?  AS she again repeated that she understood that the refill was denied.  Please advise?

## 2014-10-26 NOTE — Progress Notes (Signed)
Spoke with the patient on the phone.  Discussed and reviewed at length the new plan with her medications as Dr. Derrel Nip prescribed below.  Reviewed Dr. Lupita Dawn message regarding maintaining visits every 3 months if refilling narcotics for non cancer use to avoid dependency.  Patient verbalized understanding and confirmed that she had a follow up appointment in August already scheduled.  She stated that she understood that she would need to maintain and follow through with that appointment.  She repeated back to me the dosage and plan with the new medications; Tramadol and hydrocodone. I made her repeat back to me that she was to take no more then 2 hydrocodone pills a day and that they needed to be atleast 6 hours apart and that tramadol was a alternate between the hydrocodone with two of them a day.  Patient understood that prescriptions are up front for pickup!

## 2014-10-26 NOTE — Progress Notes (Unsigned)
I have decided to refill the vicoind for twice daily use #60/month and let her alternate with tramadol to avoid overuse of tylenol.  IPlease review  this message with patient because going forward  as it reflects a change in my policy on narcotics  refills:  As a result of the recent position statements from the CDC and the AMA on the prescription of opioids (narcotics) for chronic pain , I am no longer comfortable prescribing narcotics indefinitely for patients with pain due to non cancer causes.  The risk of overdose is significant  and my liability is too great to continue under my current rationale. Going forward, refills on narcotics will require an office visit every 3 months, and the purpose of the visits will be to review alternative ways of managing your pain to prevent narcotic dependency. I also recommend that you consider allowing me to refer you to a pain management specialist who will be able to provide you with a more comprehensive treatment plan than I can do as an internist

## 2014-10-26 NOTE — Telephone Encounter (Signed)
Patient came to office with intentions of picking up a prescription for hydrocodone.  Patient had called on 6.24.16 for the same refill and it was denied by you as it was too early for the refill.  She last filled it on 6.4.16 #120.  I had Morey Hummingbird check the [employer] and she is within the limits for a refill now with it being 7.5.16.  Please advise and I will call patient with an update.  Thanks

## 2014-10-26 NOTE — Telephone Encounter (Signed)
Refill denied.  See  Last note. #120 refilled on June 4 with use of 2 daily means that the rx on June 4 should have lasted  her two months, so clearly someone else is taking her controlled substance ,  And I will not refill

## 2014-11-25 ENCOUNTER — Encounter: Payer: Self-pay | Admitting: Internal Medicine

## 2014-11-25 ENCOUNTER — Ambulatory Visit (INDEPENDENT_AMBULATORY_CARE_PROVIDER_SITE_OTHER): Payer: Medicare Other | Admitting: Internal Medicine

## 2014-11-25 VITALS — BP 122/84 | HR 66 | Temp 97.9°F | Resp 14 | Ht 64.0 in | Wt 134.0 lb

## 2014-11-25 DIAGNOSIS — F32A Depression, unspecified: Secondary | ICD-10-CM

## 2014-11-25 DIAGNOSIS — M25552 Pain in left hip: Secondary | ICD-10-CM | POA: Diagnosis not present

## 2014-11-25 DIAGNOSIS — R748 Abnormal levels of other serum enzymes: Secondary | ICD-10-CM | POA: Diagnosis not present

## 2014-11-25 DIAGNOSIS — E785 Hyperlipidemia, unspecified: Secondary | ICD-10-CM

## 2014-11-25 DIAGNOSIS — G894 Chronic pain syndrome: Secondary | ICD-10-CM | POA: Diagnosis not present

## 2014-11-25 DIAGNOSIS — F329 Major depressive disorder, single episode, unspecified: Secondary | ICD-10-CM

## 2014-11-25 DIAGNOSIS — Z79899 Other long term (current) drug therapy: Secondary | ICD-10-CM | POA: Diagnosis not present

## 2014-11-25 DIAGNOSIS — D500 Iron deficiency anemia secondary to blood loss (chronic): Secondary | ICD-10-CM | POA: Diagnosis not present

## 2014-11-25 DIAGNOSIS — M810 Age-related osteoporosis without current pathological fracture: Secondary | ICD-10-CM

## 2014-11-25 DIAGNOSIS — G8929 Other chronic pain: Secondary | ICD-10-CM

## 2014-11-25 DIAGNOSIS — D509 Iron deficiency anemia, unspecified: Secondary | ICD-10-CM | POA: Insufficient documentation

## 2014-11-25 DIAGNOSIS — E875 Hyperkalemia: Secondary | ICD-10-CM

## 2014-11-25 DIAGNOSIS — R636 Underweight: Secondary | ICD-10-CM

## 2014-11-25 DIAGNOSIS — I10 Essential (primary) hypertension: Secondary | ICD-10-CM

## 2014-11-25 LAB — COMPREHENSIVE METABOLIC PANEL
ALT: 17 U/L (ref 0–35)
AST: 21 U/L (ref 0–37)
Albumin: 4 g/dL (ref 3.5–5.2)
Alkaline Phosphatase: 115 U/L (ref 39–117)
BILIRUBIN TOTAL: 0.4 mg/dL (ref 0.2–1.2)
BUN: 21 mg/dL (ref 6–23)
CO2: 29 meq/L (ref 19–32)
CREATININE: 1.09 mg/dL (ref 0.40–1.20)
Calcium: 9.6 mg/dL (ref 8.4–10.5)
Chloride: 106 mEq/L (ref 96–112)
GFR: 51.23 mL/min — AB (ref >60.00)
Glucose, Bld: 99 mg/dL (ref 70–99)
Potassium: 5.6 mEq/L — ABNORMAL HIGH (ref 3.5–5.1)
SODIUM: 141 meq/L (ref 135–145)
TOTAL PROTEIN: 6.9 g/dL (ref 6.0–8.3)

## 2014-11-25 LAB — FERRITIN: Ferritin: 42.8 ng/mL (ref 10.0–291.0)

## 2014-11-25 LAB — IRON AND TIBC
%SAT: 33 % (ref 20–55)
Iron: 103 ug/dL (ref 42–145)
TIBC: 309 ug/dL (ref 250–470)
UIBC: 206 ug/dL (ref 125–400)

## 2014-11-25 LAB — CBC WITH DIFFERENTIAL/PLATELET
BASOS PCT: 0.6 % (ref 0.0–3.0)
Basophils Absolute: 0 10*3/uL (ref 0.0–0.1)
Eosinophils Absolute: 0.2 10*3/uL (ref 0.0–0.7)
Eosinophils Relative: 3.1 % (ref 0.0–5.0)
HCT: 39.2 % (ref 36.0–46.0)
Hemoglobin: 13 g/dL (ref 12.0–15.0)
LYMPHS ABS: 1.3 10*3/uL (ref 0.7–4.0)
LYMPHS PCT: 20.6 % (ref 12.0–46.0)
MCHC: 33.2 g/dL (ref 30.0–36.0)
MCV: 92.8 fl (ref 78.0–100.0)
MONOS PCT: 5 % (ref 3.0–12.0)
Monocytes Absolute: 0.3 10*3/uL (ref 0.1–1.0)
Neutro Abs: 4.6 10*3/uL (ref 1.4–7.7)
Neutrophils Relative %: 70.7 % (ref 43.0–77.0)
Platelets: 197 10*3/uL (ref 150.0–400.0)
RBC: 4.23 Mil/uL (ref 3.87–5.11)
RDW: 13.7 % (ref 11.5–15.5)
WBC: 6.5 10*3/uL (ref 4.0–10.5)

## 2014-11-25 LAB — LDL CHOLESTEROL, DIRECT: LDL DIRECT: 94 mg/dL

## 2014-11-25 LAB — VITAMIN B12: Vitamin B-12: 444 pg/mL (ref 211–911)

## 2014-11-25 MED ORDER — HYDROCODONE-ACETAMINOPHEN 5-325 MG PO TABS: ORAL_TABLET | ORAL | Status: DC

## 2014-11-25 MED ORDER — ALENDRONATE SODIUM 70 MG PO TABS: 70.0000 mg | ORAL_TABLET | ORAL | Status: DC

## 2014-11-25 NOTE — Progress Notes (Signed)
Pre-visit discussion using our clinic review tool. No additional management support is needed unless otherwise documented below in the visit note.  

## 2014-11-25 NOTE — Progress Notes (Signed)
Subjective:  Patient ID: Anna Prince, female    DOB: 1931-07-27  Age: 81 y.o. MRN: [id number]  CC: The primary encounter diagnosis was Chronic pain syndrome. Diagnoses of Left hip pain, Hyperlipidemia, Iron deficiency anemia due to chronic blood loss, Elevated liver enzymes, Osteoporosis, Underweight, Essential hypertension, Hyperkalemia, Chronic left hip pain, and Depression were also pertinent to this visit.  HPI Anna Prince presents for follow up on chronic pain secondary to DJD  And osteoporosis with  prior fractures, hypertension, weight loss secondary to depression,  and hyperlipidemia .    Has gained wt with use of nightly  remeron  Appetite is good and most active in the evening .  She has some increased anxiety which she  negaively affects her appeitite.  States th t she worries about her daughter    Continues to smoke 1/2 pack daily .   Legs feel weak when getting up from chair .  not walking due to left hip pain   Outpatient Prescriptions Prior to Visit  Medication Sig Dispense Refill  . albuterol (PROVENTIL HFA;VENTOLIN HFA) 108 (90 BASE) MCG/ACT inhaler Inhale 2 puffs into the lungs every 6 (six) hours as needed for wheezing. 8.5 g 11  . cyanocobalamin (,VITAMIN B-12,) 1000 MCG/ML injection INJECT 1 ML (1,000 MCG TOTAL) INTO THE MUSCLE EVERY 30 (THIRTY) DAYS. 10 mL 0  . cyanocobalamin 2000 MCG tablet Take 2,000 mcg by mouth daily.    . diazepam (VALIUM) 5 MG tablet TAKE 1 TABLET BY MOUTH TWICE DAILY AS NEEDED 60 tablet 2  . lisinopril (PRINIVIL,ZESTRIL) 20 MG tablet TAKE 1 TABLET BY MOUTH EVERY DAY 90 tablet 1  . lovastatin (MEVACOR) 40 MG tablet TAKE 1 TABLET BY MOUTH EVERY DAY. 90 tablet 1  . meloxicam (MOBIC) 15 MG tablet Take 1 tablet (15 mg total) by mouth daily. 30 tablet 5  . mirtazapine (REMERON) 30 MG tablet TAKE 1 TABLET BY MOUTH EVERY NIGHT AT BEDTIME 30 tablet 0  . mirtazapine (REMERON) 30 MG tablet Take 1 tablet (30 mg total) by mouth at bedtime. 30  tablet 5  . omeprazole (PRILOSEC) 40 MG capsule Take 1 capsule (40 mg total) by mouth daily. 90 capsule 3  . PARoxetine (PAXIL) 20 MG tablet Take 1 tablet (20 mg total) by mouth daily. 30 tablet 5  . Syringe, Disposable, 1 ML MISC for b12 injections,  Monthly 25 each 0  . traMADol (ULTRAM) 50 MG tablet Take 1 tablet (50 mg total) by mouth 2 (two) times daily. 60 tablet 3  . HYDROcodone-acetaminophen (NORCO/VICODIN) 5-325 MG per tablet TAKE 1 TABLET BY MOUTH EVERY 6 HOURS as needed for back and hip pain 60 tablet 0  . cyanocobalamin (,VITAMIN B-12,) 1000 MCG/ML injection Inject 1 mL (1,000 mcg total) into the muscle every 30 (thirty) days. 10 mL 5  . Vitamin D, Ergocalciferol, (DRISDOL) 50000 UNITS CAPS capsule TAKE 1 CAPSULE BY MOUTH EVERY WEEK 12 capsule 0   No facility-administered medications prior to visit.    Review of Systems;  Patient denies headache, fevers, malaise, unintentional weight loss, skin rash, eye pain, sinus congestion and sinus pain, sore throat, dysphagia,  hemoptysis , cough, dyspnea, wheezing, chest pain, palpitations, orthopnea, edema, abdominal pain, nausea, melena, diarrhea, constipation, flank pain, dysuria, hematuria, urinary  Frequency, nocturia, numbness, tingling, seizures,  Focal weakness, Loss of consciousness,  Tremor, insomnia, depression, anxiety, and suicidal ideation.      Objective:  BP 122/84 mmHg  Pulse 66  Temp(Src) 97.9 F (36.6  C) (Oral)  Resp 14  Ht '5\' 4"'  (1.626 m)  Wt 134 lb (60.782 kg)  BMI 22.99 kg/m2  SpO2 97%  BP Readings from Last 3 Encounters:  11/25/14 122/84  08/25/14 118/62  02/18/14 160/78    Wt Readings from Last 3 Encounters:  11/25/14 134 lb (60.782 kg)  08/25/14 128 lb 12 oz (58.401 kg)  02/18/14 131 lb 12 oz (59.761 kg)    General appearance: alert, cooperative and appears stated age Ears: normal TM's and external ear canals both ears Throat: lips, mucosa, and tongue normal; teeth and gums normal Neck: no  adenopathy, no carotid bruit, supple, symmetrical, trachea midline and thyroid not enlarged, symmetric, no tenderness/mass/nodules Back: symmetric, no curvature. ROM normal. No CVA tenderness. Lungs: clear to auscultation bilaterally Heart: regular rate and rhythm, S1, S2 normal, no murmur, click, rub or gallop Abdomen: soft, non-tender; bowel sounds normal; no masses,  no organomegaly Pulses: 2+ and symmetric Skin: Skin color, texture, turgor normal. No rashes or lesions Lymph nodes: Cervical, supraclavicular, and axillary nodes normal.  No results found for: HGBA1C  Lab Results  Component Value Date   CREATININE 1.09 11/25/2014   CREATININE 1.10 09/01/2014   CREATININE 1.05 08/24/2014    Lab Results  Component Value Date   WBC 6.5 11/25/2014   HGB 13.0 11/25/2014   HCT 39.2 11/25/2014   PLT 197.0 11/25/2014   GLUCOSE 99 11/25/2014   CHOL 169 08/24/2014   TRIG 91.0 08/24/2014   HDL 66.10 08/24/2014   LDLDIRECT 94.0 11/25/2014   LDLCALC 85 08/24/2014   ALT 17 11/25/2014   AST 21 11/25/2014   NA 141 11/25/2014   K 5.6* 11/25/2014   CL 106 11/25/2014   CREATININE 1.09 11/25/2014   BUN 21 11/25/2014   CO2 29 11/25/2014   TSH 0.54 08/24/2014   INR 1.0 04/17/2014    Dg Hip Unilat With Pelvis 2-3 Views Left  08/25/2014   CLINICAL DATA:  Left hip and femur pain  EXAM: LEFT HIP (WITH PELVIS) 2-3 VIEWS  COMPARISON:  CT abdomen pelvis of 04/17/2014  FINDINGS: The bones are diffusely osteopenic. There is mild degenerative joint disease of the hips with some loss of joint space superiorly with mild spurring. No acute fracture is seen. The pelvic rami appear intact. The SI joints are not well seen due to rotation. There is a lumbar scoliosis noted.  IMPRESSION: 1. Osteopenia and mild degenerative change of the hips. 2. No acute fracture. 3. Lumbar scoliosis.   Electronically Signed   By: Ivar Drape M.D.   On: 08/25/2014 13:14    Assessment & Plan:   Problem List Items Addressed  This Visit      Unprioritized   Hypertension    Well controlled on current regimen. Renal function stable,  But potassium is slightly elevated.  no changes today, vut will repeat next week.  Lab Results  Component Value Date   CREATININE 1.09 11/25/2014   Lab Results  Component Value Date   NA 141 11/25/2014   K 5.6* 11/25/2014   CL 106 11/25/2014   CO2 29 11/25/2014         Osteoporosis    Stopped alendronate last year for unclear reasons ,.  Resuming today      Relevant Medications   alendronate (FOSAMAX) 70 MG tablet   Depression    Improved with addition or mirtazipine to paxil.   No changes today         Chronic pain syndrome - Primary  Secondary to osteoporotic fractures. Refill on vicodin given with explicit instructions mutually agreed upon to limit ue to 2 daily.  Use tylenol, meloxicam.       Relevant Medications   HYDROcodone-acetaminophen (NORCO/VICODIN) 5-325 MG per tablet   Underweight    Secondary to depression and chronic pain  Improved with remeron  No changes today.  Wt Readings from Last 3 Encounters:  11/25/14 134 lb (60.782 kg)  08/25/14 128 lb 12 oz (58.401 kg)  02/18/14 131 lb 12 oz (59.761 kg)        Hyperkalemia   Relevant Orders   Basic metabolic panel   Chronic left hip pain    She has mild DJD and spurring by Dec 2015 plain films.  Her pain is aggravated by rising from seated position and is limiting her activity,  despite use of vicodin  , meloxicam and tylenol.  Referral to [facility]      Anemia   Relevant Orders   CBC with Differential/Platelet (Completed)   Ferritin (Completed)   Iron and TIBC (Completed)   Vitamin B12 (Completed)   Elevated liver enzymes   Relevant Orders   Comp Met (CMET) (Completed)   Hyperlipidemia   Relevant Orders   LDL cholesterol, direct (Completed)    Other Visit Diagnoses    Left hip pain        Relevant Orders    Ambulatory referral to Orthopedic Surgery       I have  discontinued Ms. Contee's Vitamin D (Ergocalciferol). I am also having her start on alendronate. Additionally, I am having her maintain her Syringe (Disposable), cyanocobalamin, cyanocobalamin, omeprazole, mirtazapine, meloxicam, mirtazapine, PARoxetine, albuterol, lovastatin, lisinopril, diazepam, traMADol, and HYDROcodone-acetaminophen.  Meds ordered this encounter  Medications  . DISCONTD: HYDROcodone-acetaminophen (NORCO/VICODIN) 5-325 MG per tablet    Sig: TAKE 1 TABLET BY MOUTH EVERY 6 HOURS as needed for back and hip pain    Dispense:  60 tablet    Refill:  0    Maximum two daily.  Refill on or after November 25 2014  . DISCONTD: HYDROcodone-acetaminophen (NORCO/VICODIN) 5-325 MG per tablet    Sig: TAKE 1 TABLET BY MOUTH EVERY 6 HOURS as needed for back and hip pain    Dispense:  60 tablet    Refill:  0    Maximum two daily.  Refill on or after December 26 2014  . HYDROcodone-acetaminophen (NORCO/VICODIN) 5-325 MG per tablet    Sig: TAKE 1 TABLET BY MOUTH EVERY 6 HOURS as needed for back and hip pain    Dispense:  60 tablet    Refill:  0    Maximum two daily.  Refill on or after January 25 2015  . alendronate (FOSAMAX) 70 MG tablet    Sig: Take 1 tablet (70 mg total) by mouth every 7 (seven) days. Take with a full glass of water on an empty stomach.    Dispense:  4 tablet    Refill:  11    Medications Discontinued During This Encounter  Medication Reason  . Vitamin D, Ergocalciferol, (DRISDOL) 50000 UNITS CAPS capsule Completed Course  . cyanocobalamin (,VITAMIN B-12,) 1000 MCG/ML injection Duplicate  . HYDROcodone-acetaminophen (NORCO/VICODIN) 5-325 MG per tablet Reorder  . HYDROcodone-acetaminophen (NORCO/VICODIN) 5-325 MG per tablet Reorder  . HYDROcodone-acetaminophen (NORCO/VICODIN) 5-325 MG per tablet Reorder  A total of 25 minutes of face to face time was spent with patient more than half of which was spent in counselling about the above mentioned conditions  and  coordination of care   Follow-up: Return in about 3 months (around 02/25/2015).   Crecencio Mc, MD

## 2014-11-25 NOTE — Patient Instructions (Addendum)
I Am refilling your vicodin to use 2 times daily as needed for severe pain ,  You can continue to use extra tylenol but maximum of 2 tylenol daily   I am resuming alendronate (Fosomax) weekly for your osteoporosis .    Referral to Dr Thornton Park,  [facility] for left hip pain   Check with your insurance about the shingles vaccine

## 2014-11-27 DIAGNOSIS — R636 Underweight: Secondary | ICD-10-CM | POA: Insufficient documentation

## 2014-11-27 DIAGNOSIS — M7072 Other bursitis of hip, left hip: Secondary | ICD-10-CM | POA: Insufficient documentation

## 2014-11-27 DIAGNOSIS — E875 Hyperkalemia: Secondary | ICD-10-CM | POA: Insufficient documentation

## 2014-11-27 NOTE — Assessment & Plan Note (Signed)
Secondary to depression and chronic pain  Improved with remeron  No changes today.  Wt Readings from Last 3 Encounters:  11/25/14 134 lb (60.782 kg)  08/25/14 128 lb 12 oz (58.401 kg)  02/18/14 131 lb 12 oz (59.761 kg)

## 2014-11-27 NOTE — Assessment & Plan Note (Signed)
Stopped alendronate last year for unclear reasons ,.  Resuming today

## 2014-11-27 NOTE — Assessment & Plan Note (Addendum)
Secondary to osteoporotic fractures. Refill on vicodin given with explicit instructions mutually agreed upon to limit ue to 2 daily.  Use tylenol, meloxicam.

## 2014-11-27 NOTE — Assessment & Plan Note (Signed)
She has mild DJD and spurring by Dec 2015 plain films.  Her pain is aggravated by rising from seated position and is limiting her activity,  despite use of vicodin  , meloxicam and tylenol.  Referral to [facility]

## 2014-11-27 NOTE — Assessment & Plan Note (Signed)
Well controlled on current regimen. Renal function stable,  But potassium is slightly elevated.  no changes today, vut will repeat next week.  Lab Results  Component Value Date   CREATININE 1.09 11/25/2014   Lab Results  Component Value Date   NA 141 11/25/2014   K 5.6* 11/25/2014   CL 106 11/25/2014   CO2 29 11/25/2014

## 2014-11-27 NOTE — Assessment & Plan Note (Signed)
Improved with addition or mirtazipine to paxil.   No changes today

## 2014-12-01 ENCOUNTER — Other Ambulatory Visit (INDEPENDENT_AMBULATORY_CARE_PROVIDER_SITE_OTHER): Payer: Medicare Other

## 2014-12-01 DIAGNOSIS — E875 Hyperkalemia: Secondary | ICD-10-CM

## 2014-12-01 LAB — BASIC METABOLIC PANEL
BUN: 22 mg/dL (ref 6–23)
CALCIUM: 9.4 mg/dL (ref 8.4–10.5)
CO2: 28 mEq/L (ref 19–32)
CREATININE: 1.07 mg/dL (ref 0.40–1.20)
Chloride: 104 mEq/L (ref 96–112)
GFR: 52.33 mL/min — ABNORMAL LOW (ref >60.00)
Glucose, Bld: 100 mg/dL — ABNORMAL HIGH (ref 70–99)
Potassium: 4.7 mEq/L (ref 3.5–5.1)
Sodium: 140 mEq/L (ref 135–145)

## 2014-12-28 ENCOUNTER — Other Ambulatory Visit: Payer: Self-pay | Admitting: Internal Medicine

## 2014-12-28 NOTE — Telephone Encounter (Signed)
Ok to refill,  printed rx  

## 2014-12-28 NOTE — Telephone Encounter (Signed)
rx faxed

## 2014-12-28 NOTE — Telephone Encounter (Signed)
Last OV 8.4.16.  Please advise refill 

## 2015-02-02 ENCOUNTER — Emergency Department: Payer: Medicare Other

## 2015-02-02 ENCOUNTER — Inpatient Hospital Stay
Admission: EM | Admit: 2015-02-02 | Discharge: 2015-02-04 | DRG: 871 | Disposition: A | Discharge status: Home / Self Care | Payer: Medicare Other | Attending: Internal Medicine | Admitting: Internal Medicine

## 2015-02-02 DIAGNOSIS — F17210 Nicotine dependence, cigarettes, uncomplicated: Secondary | ICD-10-CM | POA: Diagnosis present

## 2015-02-02 DIAGNOSIS — F329 Major depressive disorder, single episode, unspecified: Secondary | ICD-10-CM | POA: Diagnosis present

## 2015-02-02 DIAGNOSIS — I959 Hypotension, unspecified: Secondary | ICD-10-CM

## 2015-02-02 DIAGNOSIS — F419 Anxiety disorder, unspecified: Secondary | ICD-10-CM | POA: Diagnosis present

## 2015-02-02 DIAGNOSIS — E559 Vitamin D deficiency, unspecified: Secondary | ICD-10-CM | POA: Diagnosis present

## 2015-02-02 DIAGNOSIS — G8929 Other chronic pain: Secondary | ICD-10-CM | POA: Diagnosis present

## 2015-02-02 DIAGNOSIS — N179 Acute kidney failure, unspecified: Secondary | ICD-10-CM | POA: Diagnosis present

## 2015-02-02 DIAGNOSIS — E785 Hyperlipidemia, unspecified: Secondary | ICD-10-CM | POA: Diagnosis present

## 2015-02-02 DIAGNOSIS — H3530 Unspecified macular degeneration: Secondary | ICD-10-CM | POA: Diagnosis present

## 2015-02-02 DIAGNOSIS — Z79899 Other long term (current) drug therapy: Secondary | ICD-10-CM

## 2015-02-02 DIAGNOSIS — J449 Chronic obstructive pulmonary disease, unspecified: Secondary | ICD-10-CM | POA: Diagnosis present

## 2015-02-02 DIAGNOSIS — J189 Pneumonia, unspecified organism: Secondary | ICD-10-CM | POA: Diagnosis present

## 2015-02-02 DIAGNOSIS — K5790 Diverticulosis of intestine, part unspecified, without perforation or abscess without bleeding: Secondary | ICD-10-CM | POA: Diagnosis present

## 2015-02-02 DIAGNOSIS — M810 Age-related osteoporosis without current pathological fracture: Secondary | ICD-10-CM | POA: Diagnosis present

## 2015-02-02 DIAGNOSIS — J441 Chronic obstructive pulmonary disease with (acute) exacerbation: Secondary | ICD-10-CM | POA: Diagnosis present

## 2015-02-02 DIAGNOSIS — Z885 Allergy status to narcotic agent status: Secondary | ICD-10-CM

## 2015-02-02 DIAGNOSIS — F0151 Vascular dementia with behavioral disturbance: Secondary | ICD-10-CM | POA: Diagnosis present

## 2015-02-02 DIAGNOSIS — Z882 Allergy status to sulfonamides status: Secondary | ICD-10-CM

## 2015-02-02 DIAGNOSIS — K219 Gastro-esophageal reflux disease without esophagitis: Secondary | ICD-10-CM | POA: Diagnosis present

## 2015-02-02 DIAGNOSIS — K259 Gastric ulcer, unspecified as acute or chronic, without hemorrhage or perforation: Secondary | ICD-10-CM | POA: Diagnosis present

## 2015-02-02 DIAGNOSIS — G894 Chronic pain syndrome: Secondary | ICD-10-CM | POA: Diagnosis present

## 2015-02-02 DIAGNOSIS — A419 Sepsis, unspecified organism: Principal | ICD-10-CM | POA: Diagnosis present

## 2015-02-02 DIAGNOSIS — I10 Essential (primary) hypertension: Secondary | ICD-10-CM | POA: Diagnosis present

## 2015-02-02 DIAGNOSIS — Z79891 Long term (current) use of opiate analgesic: Secondary | ICD-10-CM

## 2015-02-02 DIAGNOSIS — F0153 Vascular dementia, unspecified severity, with mood disturbance: Secondary | ICD-10-CM | POA: Diagnosis present

## 2015-02-02 DIAGNOSIS — F0150 Vascular dementia without behavioral disturbance: Secondary | ICD-10-CM | POA: Diagnosis present

## 2015-02-02 HISTORY — DX: Chronic obstructive pulmonary disease, unspecified: J44.9

## 2015-02-02 MED ORDER — IPRATROPIUM-ALBUTEROL 0.5-2.5 (3) MG/3ML IN SOLN
3.0000 mL | Freq: Once | RESPIRATORY_TRACT | Status: AC
  Administered 2015-02-02: 3 mL via RESPIRATORY_TRACT
  Filled 2015-02-02: qty 3

## 2015-02-02 MED ORDER — SODIUM CHLORIDE 0.9 % IV BOLUS (SEPSIS)
1000.0000 mL | INTRAVENOUS | Status: AC
Start: 2015-02-02 — End: 2015-02-03
  Administered 2015-02-02 – 2015-02-03 (×2): 1000 mL via INTRAVENOUS

## 2015-02-02 MED ORDER — DEXTROSE 5 % IV SOLN
1.0000 g | Freq: Once | INTRAVENOUS | Status: AC
  Administered 2015-02-03: 1 g via INTRAVENOUS
  Filled 2015-02-02: qty 10

## 2015-02-02 MED ORDER — DEXTROSE 5 % IV SOLN
500.0000 mg | Freq: Once | INTRAVENOUS | Status: AC
  Administered 2015-02-03: 500 mg via INTRAVENOUS
  Filled 2015-02-02: qty 500

## 2015-02-02 MED ORDER — METHYLPREDNISOLONE SODIUM SUCC 125 MG IJ SOLR
125.0000 mg | Freq: Once | INTRAMUSCULAR | Status: AC
  Administered 2015-02-02: 125 mg via INTRAVENOUS
  Filled 2015-02-02: qty 2

## 2015-02-02 MED ORDER — SODIUM CHLORIDE 0.9 % IV BOLUS (SEPSIS): 1000.0000 mL | Freq: Once | INTRAVENOUS | Status: DC

## 2015-02-02 NOTE — ED Notes (Addendum)
Pt to triage via w/c with no distress noted; pt cold symptoms x week accomp by fever; st productive cough yellow sputum; st temp 102 at home and admin motrin PTA

## 2015-02-02 NOTE — ED Provider Notes (Signed)
[facility] Emergency Department Provider Note  ____________________________________________  Time seen: Approximately 11:12 PM  I have reviewed the triage vital signs and the nursing notes.   HISTORY  Chief Complaint Cough    HPI Anna Prince is a 81 y.o. female who presents to the ED from home with a chief complaint of cough, fever, shortness of breath. Patient notes onset of symptoms for approximately one week, worsening times past 1-2 days. Complains of fever of 102F at home, cough productive of yellow sputum, chills, shortness of breath, chest tightness and generalized malaise.Patient took Motrin prior to arrival. Patient has a history of COPD and notes increased wheezing. Patient does not wear oxygen at baseline. Denies nausea, vomiting, diarrhea, abdominal pain, headache. Denies recent travel.   Past Medical History  Diagnosis Date  . Barrett's esophagus 2007  . Sigmoid diverticulosis     by colonoscopy  . Aortic aneurysm   . Macular degeneration   . Anxiety   . Hyperlipidemia   . Hypertension   . Osteoporosis   . Lumbago   . Depression   . Bursitis 2012    left hip, improved with periodic steroid injection [facility] [place])  . Anemia 2012    of acute blood loss, resolved  . GERD (gastroesophageal reflux disease)     with Barretts Esophagus    Patient Active Problem List   Diagnosis Date Noted  . Underweight 11/27/2014  . Hyperkalemia 11/27/2014  . Chronic left hip pain 11/27/2014  . Anemia 11/25/2014  . Elevated liver enzymes 08/25/2014  . Pubic ramus fracture ([place]) 08/25/2014  . Vascular dementia of acute onset without behavioral disturbance 02/20/2014  . Chronic pain syndrome 05/27/2013  . Tobacco abuse counseling 04/10/2013  . Skin lesion of face 02/22/2012  . GERD (gastroesophageal reflux disease)   . Screening for colon cancer 06/18/2011  . COPD (chronic obstructive pulmonary disease) ([place]) 06/18/2011  . Bursitis   .  Skin cancer of nose 12/17/2010  . Vitamin D deficiency 12/17/2010  . Hyperlipidemia LDL goal <100 12/17/2010  . Screening for malignant neoplasm of breast 12/17/2010  . Encounter for long-term (current) use of other medications 12/17/2010  . Aortic aneurysm, abdominal ([place]) 12/17/2010  . Diverticulosis, sigmoid 12/08/2010  . Barrett's esophagus 12/08/2010  . Hyperlipidemia 12/08/2010  . Hypertension 12/08/2010  . Lumbago 12/08/2010  . Osteoporosis 12/08/2010  . Depression 12/08/2010    Past Surgical History  Procedure Laterality Date  . Appendectomy    . Back surgery      X2..1975 arachnoid cyst cervical region([place]);1997 lumbosacral tumor,9 hr surgery  . Abdominal aortic aneurysm repair  July 2010    [facility]    Current Outpatient Rx  Name  Route  Sig  Dispense  Refill  . albuterol (PROVENTIL HFA;VENTOLIN HFA) 108 (90 BASE) MCG/ACT inhaler   Inhalation   Inhale 2 puffs into the lungs every 6 (six) hours as needed for wheezing.   8.5 g   11   . alendronate (FOSAMAX) 70 MG tablet   Oral   Take 1 tablet (70 mg total) by mouth every 7 (seven) days. Take with a full glass of water on an empty stomach.   4 tablet   11   . cyanocobalamin (,VITAMIN B-12,) 1000 MCG/ML injection      INJECT 1 ML (1,000 MCG TOTAL) INTO THE MUSCLE EVERY 30 (THIRTY) DAYS.   10 mL   0   . cyanocobalamin 2000 MCG tablet   Oral   Take 2,000  mcg by mouth daily.         . diazepam (VALIUM) 5 MG tablet      TAKE 1 TABLET BY MOUTH TWICE DAILY AS NEEDED   60 tablet   2   . HYDROcodone-acetaminophen (NORCO/VICODIN) 5-325 MG per tablet      TAKE 1 TABLET BY MOUTH EVERY 6 HOURS as needed for back and hip pain   60 tablet   0     Maximum two daily.  Refill on or after October  4  ...   . lisinopril (PRINIVIL,ZESTRIL) 20 MG tablet      TAKE 1 TABLET BY MOUTH EVERY DAY   90 tablet   1   . lovastatin (MEVACOR) 40 MG tablet      TAKE 1 TABLET BY MOUTH EVERY DAY.   90 tablet   1    . meloxicam (MOBIC) 15 MG tablet   Oral   Take 1 tablet (15 mg total) by mouth daily.   30 tablet   5   . mirtazapine (REMERON) 30 MG tablet      TAKE 1 TABLET BY MOUTH EVERY NIGHT AT BEDTIME   30 tablet   0   . mirtazapine (REMERON) 30 MG tablet   Oral   Take 1 tablet (30 mg total) by mouth at bedtime.   30 tablet   5   . omeprazole (PRILOSEC) 40 MG capsule   Oral   Take 1 capsule (40 mg total) by mouth daily.   90 capsule   3   . PARoxetine (PAXIL) 20 MG tablet   Oral   Take 1 tablet (20 mg total) by mouth daily.   30 tablet   5   . Syringe, Disposable, 1 ML MISC      for b12 injections,  Monthly   25 each   0   . traMADol (ULTRAM) 50 MG tablet   Oral   Take 1 tablet (50 mg total) by mouth 2 (two) times daily.   60 tablet   3     Allergies Morphine sulfate  Family History  Problem Relation Age of Onset  . Coronary artery disease Mother   . Coronary artery disease Father 49  . Cancer Neg Hx     no breast,coon or ovarian ca    Social History Social History  Substance Use Topics  . Smoking status: Current Every Day Smoker    Types: Cigarettes  . Smokeless tobacco: Never Used     Comment: HAS BEEN SMOKING 30+ YEARS,RESUMED TOBACCO USE AFTER AAA REPAIR  . Alcohol Use: No    Review of Systems Constitutional: Positive for fever/chills Eyes: No visual changes. ENT: No sore throat. Cardiovascular: Positive for chest pain. Respiratory: Positive for shortness of breath. Gastrointestinal: No abdominal pain.  No nausea, no vomiting.  No diarrhea.  No constipation. Genitourinary: Negative for dysuria. Musculoskeletal: Negative for back pain. Skin: Negative for rash. Neurological: Negative for headaches, focal weakness or numbness.  10-point ROS otherwise negative.  ____________________________________________   PHYSICAL EXAM:  VITAL SIGNS: ED Triage Vitals  Enc Vitals Group     BP 02/02/15 2244 85/56 mmHg     Pulse Rate 02/02/15 2244 107      Resp 02/02/15 2244 22     Temp 02/02/15 2244 98.8 F (37.1 C)     Temp Source 02/02/15 2244 Oral     SpO2 02/02/15 2244 93 %     Weight 02/02/15 2244 132 lb (59.875 kg)  Height 02/02/15 2244 5\' 5"  (1.651 m)     Head Cir --      Peak Flow --      Pain Score 02/02/15 2247 4     Pain Loc --      Pain Edu? --      Excl. in [place]? --     Constitutional: Alert and oriented. Ill-appearing and in mild acute distress. Eyes: Conjunctivae are normal. PERRL. EOMI. Head: Atraumatic. Nose: No congestion/rhinnorhea. Mouth/Throat: Mucous membranes are moist.  Oropharynx non-erythematous. Neck: No stridor. No carotid bruits. Cardiovascular: Tachycardic rate, regular rhythm. Grossly normal heart sounds.  Good peripheral circulation. Respiratory: Increased respiratory effort.  No retractions. Lungs with scattered rhonchi and wheezing. Gastrointestinal: Soft and nontender. No distention. No abdominal bruits. No CVA tenderness. Musculoskeletal: No lower extremity tenderness nor edema.  No joint effusions. Neurologic:  Normal speech and language. No gross focal neurologic deficits are appreciated.  Skin:  Skin is warm, dry and intact. No rash noted. Psychiatric: Mood and affect are normal. Speech and behavior are normal.  ____________________________________________   LABS (all labs ordered are listed, but only abnormal results are displayed)  Labs Reviewed  CBC WITH DIFFERENTIAL/PLATELET - Abnormal; Notable for the following:    WBC 14.0 (*)    RBC 3.48 (*)    Hemoglobin 10.9 (*)    HCT 32.3 (*)    Neutro Abs 12.6 (*)    Lymphs Abs 0.4 (*)    Monocytes Absolute 1.0 (*)    All other components within normal limits  COMPREHENSIVE METABOLIC PANEL - Abnormal; Notable for the following:    Sodium 134 (*)    Glucose, Bld 136 (*)    BUN 29 (*)    Creatinine, Ser 1.49 (*)    Calcium 8.6 (*)    Albumin 3.2 (*)    AST 45 (*)    GFR calc non Af Amer 32 (*)    GFR calc Af Amer 37 (*)    All  other components within normal limits  CULTURE, BLOOD (ROUTINE X 2)  CULTURE, BLOOD (ROUTINE X 2)  TROPONIN I  LACTIC ACID, PLASMA  LACTIC ACID, PLASMA   ____________________________________________  EKG  ED ECG REPORT I, Daisuke Bailey J, the attending physician, personally viewed and interpreted this ECG.   Date: 02/03/2015  EKG Time: 0006  Rate: 76  Rhythm: normal EKG, normal sinus rhythm  Axis: normal  Intervals:none  ST&T Change: nonspecific  ____________________________________________  RADIOLOGY  Portable chest x-ray (viewed by me, interpreted per Dr. Nevada Crane): Chronic hyperinflation with mildly increased medial right lung base opacity compatible with acute infectious exacerbation. No pleural effusion. Hiatal hernia. ____________________________________________   PROCEDURES  Procedure(s) performed: None  Critical Care performed: Yes, see critical care note(s)   CRITICAL CARE Performed by: Paulette Blanch   Total critical care time: 30 minutes  Critical care time was exclusive of separately billable procedures and treating other patients.  Critical care was necessary to treat or prevent imminent or life-threatening deterioration.  Critical care was time spent personally by me on the following activities: development of treatment plan with patient and/or surrogate as well as nursing, discussions with consultants, evaluation of patient's response to treatment, examination of patient, obtaining history from patient or surrogate, ordering and performing treatments and interventions, ordering and review of laboratory studies, ordering and review of radiographic studies, pulse oximetry and re-evaluation of patient's condition.  ____________________________________________   INITIAL IMPRESSION / ASSESSMENT AND PLAN / ED COURSE  Pertinent labs & imaging results that  were available during my care of the patient were reviewed by me and considered in my medical decision making  (see chart for details).  81 year old female who presents with fever, productive cough, shortness of breath with the clinical picture which is concerning for community acquired pneumonia. She is tachycardic and hypotensive; clinical picture for sepsis. Will initiate ED sepsis protocol, obtain screening labs, x-ray, initiate nebulizer treatment, IV Solu-Medrol and IV antibiotics.  ----------------------------------------- 12:53 AM on 02/03/2015 -----------------------------------------  Blood pressure improved with IV fluids. IV antibiotics infusing. Lung exam improved. Updated patient and family of laboratory and imaging results. Discussed with hospitalist for evaluation in the emergency department for admission. ____________________________________________   FINAL CLINICAL IMPRESSION(S) / ED DIAGNOSES  Final diagnoses:  Sepsis, due to unspecified organism [facility])  Community acquired pneumonia  Hypotension, unspecified hypotension type  COPD exacerbation (HCC)      Paulette Blanch, MD 02/03/15 [id number]

## 2015-02-03 ENCOUNTER — Encounter: Payer: Self-pay | Admitting: Emergency Medicine

## 2015-02-03 DIAGNOSIS — F17210 Nicotine dependence, cigarettes, uncomplicated: Secondary | ICD-10-CM | POA: Diagnosis present

## 2015-02-03 DIAGNOSIS — J189 Pneumonia, unspecified organism: Secondary | ICD-10-CM | POA: Diagnosis present

## 2015-02-03 DIAGNOSIS — Z882 Allergy status to sulfonamides status: Secondary | ICD-10-CM | POA: Diagnosis not present

## 2015-02-03 DIAGNOSIS — Z885 Allergy status to narcotic agent status: Secondary | ICD-10-CM | POA: Diagnosis not present

## 2015-02-03 DIAGNOSIS — K219 Gastro-esophageal reflux disease without esophagitis: Secondary | ICD-10-CM | POA: Diagnosis present

## 2015-02-03 DIAGNOSIS — N179 Acute kidney failure, unspecified: Secondary | ICD-10-CM | POA: Diagnosis present

## 2015-02-03 DIAGNOSIS — Z79899 Other long term (current) drug therapy: Secondary | ICD-10-CM | POA: Diagnosis not present

## 2015-02-03 DIAGNOSIS — Z79891 Long term (current) use of opiate analgesic: Secondary | ICD-10-CM | POA: Diagnosis not present

## 2015-02-03 DIAGNOSIS — E559 Vitamin D deficiency, unspecified: Secondary | ICD-10-CM | POA: Diagnosis present

## 2015-02-03 DIAGNOSIS — E785 Hyperlipidemia, unspecified: Secondary | ICD-10-CM | POA: Diagnosis present

## 2015-02-03 DIAGNOSIS — K5790 Diverticulosis of intestine, part unspecified, without perforation or abscess without bleeding: Secondary | ICD-10-CM | POA: Diagnosis present

## 2015-02-03 DIAGNOSIS — F0150 Vascular dementia without behavioral disturbance: Secondary | ICD-10-CM | POA: Diagnosis present

## 2015-02-03 DIAGNOSIS — M810 Age-related osteoporosis without current pathological fracture: Secondary | ICD-10-CM | POA: Diagnosis present

## 2015-02-03 DIAGNOSIS — H3530 Unspecified macular degeneration: Secondary | ICD-10-CM | POA: Diagnosis present

## 2015-02-03 DIAGNOSIS — A419 Sepsis, unspecified organism: Secondary | ICD-10-CM | POA: Diagnosis present

## 2015-02-03 DIAGNOSIS — J441 Chronic obstructive pulmonary disease with (acute) exacerbation: Secondary | ICD-10-CM | POA: Diagnosis present

## 2015-02-03 DIAGNOSIS — F329 Major depressive disorder, single episode, unspecified: Secondary | ICD-10-CM | POA: Diagnosis present

## 2015-02-03 DIAGNOSIS — F419 Anxiety disorder, unspecified: Secondary | ICD-10-CM | POA: Diagnosis present

## 2015-02-03 DIAGNOSIS — G894 Chronic pain syndrome: Secondary | ICD-10-CM | POA: Diagnosis present

## 2015-02-03 DIAGNOSIS — G8929 Other chronic pain: Secondary | ICD-10-CM | POA: Diagnosis present

## 2015-02-03 DIAGNOSIS — I10 Essential (primary) hypertension: Secondary | ICD-10-CM | POA: Diagnosis present

## 2015-02-03 LAB — COMPREHENSIVE METABOLIC PANEL
ALK PHOS: 111 U/L (ref 38–126)
ALT: 37 U/L (ref 14–54)
AST: 45 U/L — ABNORMAL HIGH (ref 15–41)
Albumin: 3.2 g/dL — ABNORMAL LOW (ref 3.5–5.0)
Anion gap: 6 (ref 5–15)
BILIRUBIN TOTAL: 0.7 mg/dL (ref 0.3–1.2)
BUN: 29 mg/dL — ABNORMAL HIGH (ref 6–20)
CALCIUM: 8.6 mg/dL — AB (ref 8.9–10.3)
CO2: 22 mmol/L (ref 22–32)
CREATININE: 1.49 mg/dL — AB (ref 0.44–1.00)
Chloride: 106 mmol/L (ref 101–111)
GFR calc non Af Amer: 32 mL/min — ABNORMAL LOW (ref >60)
GFR, EST AFRICAN AMERICAN: 37 mL/min — AB (ref >60)
GLUCOSE: 136 mg/dL — AB (ref 65–99)
Potassium: 4.6 mmol/L (ref 3.5–5.1)
SODIUM: 134 mmol/L — AB (ref 135–145)
TOTAL PROTEIN: 6.5 g/dL (ref 6.5–8.1)

## 2015-02-03 LAB — CBC WITH DIFFERENTIAL/PLATELET
Basophils Absolute: 0 10*3/uL (ref 0–0.1)
Basophils Relative: 0 %
EOS ABS: 0 10*3/uL (ref 0–0.7)
Eosinophils Relative: 0 %
HEMATOCRIT: 32.3 % — AB (ref 35.0–47.0)
HEMOGLOBIN: 10.9 g/dL — AB (ref 12.0–16.0)
LYMPHS ABS: 0.4 10*3/uL — AB (ref 1.0–3.6)
Lymphocytes Relative: 3 %
MCH: 31.2 pg (ref 26.0–34.0)
MCHC: 33.6 g/dL (ref 32.0–36.0)
MCV: 92.9 fL (ref 80.0–100.0)
MONOS PCT: 7 %
Monocytes Absolute: 1 10*3/uL — ABNORMAL HIGH (ref 0.2–0.9)
NEUTROS ABS: 12.6 10*3/uL — AB (ref 1.4–6.5)
NEUTROS PCT: 90 %
Platelets: 207 10*3/uL (ref 150–440)
RBC: 3.48 MIL/uL — AB (ref 3.80–5.20)
RDW: 13.7 % (ref 11.5–14.5)
WBC: 14 10*3/uL — AB (ref 3.6–11.0)

## 2015-02-03 LAB — TROPONIN I: Troponin I: <0.03 ng/mL (ref <0.031)

## 2015-02-03 LAB — LACTIC ACID, PLASMA
Lactic Acid, Venous: 0.8 mmol/L (ref 0.5–2.0)
Lactic Acid, Venous: 0.8 mmol/L (ref 0.5–2.0)

## 2015-02-03 LAB — GLUCOSE, CAPILLARY: GLUCOSE-CAPILLARY: 191 mg/dL — AB (ref 65–99)

## 2015-02-03 MED ORDER — HEPARIN SODIUM (PORCINE) 5000 UNIT/ML IJ SOLN
5000.0000 [IU] | Freq: Three times a day (TID) | INTRAMUSCULAR | Status: DC
  Administered 2015-02-03 – 2015-02-04 (×4): 5000 [IU] via SUBCUTANEOUS
  Filled 2015-02-03 (×2): qty 1

## 2015-02-03 MED ORDER — PAROXETINE HCL 20 MG PO TABS
20.0000 mg | ORAL_TABLET | Freq: Every day | ORAL | Status: DC
  Filled 2015-02-03: qty 1

## 2015-02-03 MED ORDER — IPRATROPIUM-ALBUTEROL 0.5-2.5 (3) MG/3ML IN SOLN
3.0000 mL | RESPIRATORY_TRACT | Status: DC | PRN
  Administered 2015-02-04: 3 mL via RESPIRATORY_TRACT
  Filled 2015-02-03: qty 3

## 2015-02-03 MED ORDER — ACETAMINOPHEN 325 MG PO TABS
650.0000 mg | ORAL_TABLET | Freq: Four times a day (QID) | ORAL | Status: DC | PRN
  Administered 2015-02-03 – 2015-02-04 (×2): 650 mg via ORAL
  Filled 2015-02-03 (×2): qty 2

## 2015-02-03 MED ORDER — ACETAMINOPHEN 650 MG RE SUPP: 650.0000 mg | Freq: Four times a day (QID) | RECTAL | Status: DC | PRN

## 2015-02-03 MED ORDER — ALBUTEROL SULFATE HFA 108 (90 BASE) MCG/ACT IN AERS: 2.0000 | INHALATION_SPRAY | Freq: Four times a day (QID) | RESPIRATORY_TRACT | Status: DC | PRN

## 2015-02-03 MED ORDER — HYDROCODONE-ACETAMINOPHEN 5-325 MG PO TABS
1.0000 | ORAL_TABLET | Freq: Four times a day (QID) | ORAL | Status: DC | PRN
  Administered 2015-02-03: 1 via ORAL
  Filled 2015-02-03: qty 1

## 2015-02-03 MED ORDER — SODIUM CHLORIDE 0.9 % IV SOLN
INTRAVENOUS | Status: DC
  Administered 2015-02-03: 04:00:00 via INTRAVENOUS
  Administered 2015-02-03: 1000 mL via INTRAVENOUS

## 2015-02-03 MED ORDER — METHYLPREDNISOLONE SODIUM SUCC 125 MG IJ SOLR
80.0000 mg | Freq: Two times a day (BID) | INTRAMUSCULAR | Status: DC
  Administered 2015-02-03: 80 mg via INTRAVENOUS
  Filled 2015-02-03: qty 2

## 2015-02-03 MED ORDER — PANTOPRAZOLE SODIUM 40 MG PO TBEC
40.0000 mg | DELAYED_RELEASE_TABLET | Freq: Every day | ORAL | Status: DC
  Administered 2015-02-03 – 2015-02-04 (×2): 40 mg via ORAL
  Filled 2015-02-03 (×2): qty 1

## 2015-02-03 MED ORDER — MIRTAZAPINE 30 MG PO TABS
30.0000 mg | ORAL_TABLET | Freq: Every day | ORAL | Status: DC
  Administered 2015-02-03: 30 mg via ORAL
  Filled 2015-02-03: qty 1

## 2015-02-03 MED ORDER — METHYLPREDNISOLONE SODIUM SUCC 40 MG IJ SOLR
40.0000 mg | Freq: Two times a day (BID) | INTRAMUSCULAR | Status: DC
  Administered 2015-02-03: 40 mg via INTRAVENOUS
  Filled 2015-02-03 (×2): qty 1

## 2015-02-03 MED ORDER — ONDANSETRON HCL 4 MG/2ML IJ SOLN: 4.0000 mg | Freq: Four times a day (QID) | INTRAMUSCULAR | Status: DC | PRN

## 2015-02-03 MED ORDER — ALBUTEROL SULFATE (2.5 MG/3ML) 0.083% IN NEBU: 2.5000 mg | INHALATION_SOLUTION | Freq: Four times a day (QID) | RESPIRATORY_TRACT | Status: DC | PRN

## 2015-02-03 MED ORDER — DEXTROSE 5 % IV SOLN
1.0000 g | INTRAVENOUS | Status: DC
  Administered 2015-02-03: 1 g via INTRAVENOUS
  Filled 2015-02-03 (×2): qty 10

## 2015-02-03 MED ORDER — SODIUM CHLORIDE 0.9 % IV BOLUS (SEPSIS)
250.0000 mL | Freq: Once | INTRAVENOUS | Status: AC
  Administered 2015-02-03: 250 mL via INTRAVENOUS

## 2015-02-03 MED ORDER — ONDANSETRON HCL 4 MG PO TABS: 4.0000 mg | ORAL_TABLET | Freq: Four times a day (QID) | ORAL | Status: DC | PRN

## 2015-02-03 MED ORDER — DEXTROSE 5 % IV SOLN
500.0000 mg | INTRAVENOUS | Status: DC
  Administered 2015-02-03: 500 mg via INTRAVENOUS
  Filled 2015-02-03 (×2): qty 500

## 2015-02-03 MED ORDER — LISINOPRIL 20 MG PO TABS
20.0000 mg | ORAL_TABLET | Freq: Every day | ORAL | Status: DC
  Administered 2015-02-03 – 2015-02-04 (×2): 20 mg via ORAL
  Filled 2015-02-03 (×2): qty 1

## 2015-02-03 MED ORDER — PRAVASTATIN SODIUM 40 MG PO TABS
40.0000 mg | ORAL_TABLET | Freq: Every day | ORAL | Status: DC
  Administered 2015-02-03: 40 mg via ORAL
  Filled 2015-02-03: qty 1

## 2015-02-03 MED ORDER — DIAZEPAM 5 MG PO TABS
5.0000 mg | ORAL_TABLET | Freq: Two times a day (BID) | ORAL | Status: DC | PRN
  Administered 2015-02-04: 5 mg via ORAL
  Filled 2015-02-03: qty 1

## 2015-02-03 MED ORDER — SODIUM CHLORIDE 0.9 % IJ SOLN
3.0000 mL | Freq: Two times a day (BID) | INTRAMUSCULAR | Status: DC
  Administered 2015-02-03 (×2): 3 mL via INTRAVENOUS

## 2015-02-03 NOTE — ED Notes (Signed)
Spoke with Helene Kelp, RN to give updated status of patient.

## 2015-02-03 NOTE — ED Notes (Signed)
Upon entry this RN entry to room, patient resting with eyes closed, no increased work in breathing noted, patient arousable with verbal stimuli but falls back asleep. Patient diaphoretic, appears pale in color. Hospitalist paged.

## 2015-02-03 NOTE — Progress Notes (Signed)
   02/03/15 1527  Clinical Encounter Type  Visited With Patient  Visit Type Initial  Consult/Referral To Chaplain  Spiritual Encounters  Spiritual Needs Prayer  Stress Factors  Patient Stress Factors Financial concerns;Health changes  Chaplain rounded in unit and offered pastoral care to the patient.   Chaplain Alanis Clift [phone]

## 2015-02-03 NOTE — Evaluation (Signed)
Physical Therapy Evaluation Patient Details Name: LUVINA POIRIER MRN: [id number] DOB: [birth year] Today's Date: 02/03/2015   History of Present Illness  Pt admitted for community acquired pneumonia. Pt with history of HTN, hyperlipidemia, COPD, and GERD.   Clinical Impression  Pt is a pleasant 81 year old female who was admitted for pneumonia. Pt demonstrates all bed mobility/transfers/ambulation at independence. At baseline, pt uses SPC, however furniture walks in home. Pt encouraged to use rw as her balance improves with increased support from AD. HEP given to patient for review and performance in home environment. Encouraged pt to continue ambulation during hospital stay using rw. Pt does not require any further PT needs at this time. Pt will be dc in house and does not require follow up. Will dc current orders.      Follow Up Recommendations No PT follow up    Equipment Recommendations       Recommendations for Other Services       Precautions / Restrictions Precautions Precautions: None Restrictions Weight Bearing Restrictions: No      Mobility  Bed Mobility Overal bed mobility: Independent             General bed mobility comments: safe technique performed with bed mobility. Pt requires cues to slow down as she is impulsive.  Transfers Overall transfer level: Independent Equipment used: Rolling walker (2 wheeled)             General transfer comment: sit<>stand with rw and independence. Safe technique performed  Ambulation/Gait Ambulation/Gait assistance: Supervision Ambulation Distance (Feet): 200 Feet Assistive device: Rolling walker (2 wheeled) Gait Pattern/deviations: Step-through pattern     General Gait Details: ambulated using rw and supervision. Safe technique performed without any complaints of SOB symptoms. 1 cue needed to stay closer to rw.   Stairs            Wheelchair Mobility    Modified Rankin (Stroke Patients Only)        Balance                                             Pertinent Vitals/Pain Pain Assessment: No/denies pain    Home Living Family/patient expects to be discharged to:: Private residence Living Arrangements: Children Available Help at Discharge: Family Type of Home: House Home Access: Stairs to enter Entrance Stairs-Rails: None Entrance Stairs-Number of Steps: 2 Home Layout: One level Home Equipment: Environmental consultant - 2 wheels;Cane - single point      Prior Function Level of Independence: Independent with assistive device(s) (uses SPC at baseline)               Hand Dominance        Extremity/Trunk Assessment   Upper Extremity Assessment: Overall WFL for tasks assessed           Lower Extremity Assessment: Overall WFL for tasks assessed         Communication   Communication: No difficulties  Cognition Arousal/Alertness: Awake/alert Behavior During Therapy: WFL for tasks assessed/performed Overall Cognitive Status: Within Functional Limits for tasks assessed                      General Comments      Exercises Other Exercises Other Exercises: Gave 2 HEP to patient for review and continuing therex at home      Assessment/Plan  PT Assessment Patent does not need any further PT services  PT Diagnosis     PT Problem List    PT Treatment Interventions     PT Goals (Current goals can be found in the Care Plan section) Acute Rehab PT Goals Patient Stated Goal: to go home PT Goal Formulation: With patient Time For Goal Achievement: 02-14-2015 Potential to Achieve Goals: Good    Frequency     Barriers to discharge        Co-evaluation               End of Session Equipment Utilized During Treatment: Gait belt Activity Tolerance: Patient tolerated treatment well Patient left: in bed;with bed alarm set Nurse Communication: Mobility status         Time: 5300-5110 PT Time Calculation (min) (ACUTE ONLY): 24  min   Charges:   PT Evaluation $Initial PT Evaluation Tier I: 1 Procedure     PT G Codes:        Dan Dissinger 02/14/2015, 4:13 PM  Greggory Stallion, PT, DPT [phone]

## 2015-02-03 NOTE — ED Notes (Signed)
Dr. Jannifer Franklin in room to reassess patient, patient somnolent during MD assessment.  MD states okay for patient to be transferred to unit, fluid orders will be changed by Dr. Jannifer Franklin.

## 2015-02-03 NOTE — Progress Notes (Signed)
Patient ID: EVELENA MASCI, female   DOB: [birth year], 81 y.o.   MRN: [id number] [facility] PROGRESS NOTE  PCP: Crecencio Mc, MD  HPI/Subjective: Patient feeling okay now. Had shortness of breath and weakness prior to coming in. She was found to have a pneumonia.  Objective: Filed Vitals:   02/03/15 0746  BP: 127/60  Pulse: 92  Temp: 97.8 F (36.6 C)  Resp: 18    Filed Weights   02/02/15 2244 02/03/15 0440  Weight: 59.875 kg (132 lb) 62.415 kg (137 lb 9.6 oz)    ROS: Review of Systems  Constitutional: Negative for fever and chills.  Eyes: Negative for blurred vision.  Respiratory: Positive for cough and shortness of breath. Negative for sputum production.   Cardiovascular: Negative for chest pain.  Gastrointestinal: Negative for nausea, vomiting, abdominal pain, diarrhea and constipation.  Genitourinary: Negative for dysuria.  Musculoskeletal: Negative for joint pain.  Neurological: Negative for dizziness and headaches.   Exam: Physical Exam  Constitutional: She is oriented to person, place, and time.  HENT:  Nose: No mucosal edema.  Mouth/Throat: No oropharyngeal exudate or posterior oropharyngeal edema.  Eyes: Conjunctivae, EOM and lids are normal. Pupils are equal, round, and reactive to light.  Neck: No JVD present. Carotid bruit is not present. No edema present. No thyroid mass and no thyromegaly present.  Cardiovascular: S1 normal and S2 normal.  Exam reveals no gallop.   No murmur heard. Pulses:      Dorsalis pedis pulses are 2+ on the right side, and 2+ on the left side.  Respiratory: No respiratory distress. She has no wheezes. She has no rhonchi. She has no rales.  GI: Soft. Bowel sounds are normal. There is no tenderness.  Musculoskeletal:       Right ankle: She exhibits no swelling.       Left ankle: She exhibits no swelling.  Lymphadenopathy:    She has no cervical adenopathy.  Neurological: She is alert and oriented to person,  place, and time. No cranial nerve deficit.  Skin: Skin is warm. No rash noted. Nails show no clubbing.  Psychiatric: She has a normal mood and affect.    Data Reviewed: Basic Metabolic Panel:  Recent Labs Lab 02/02/15 2331  NA 134*  K 4.6  CL 106  CO2 22  GLUCOSE 136*  BUN 29*  CREATININE 1.49*  CALCIUM 8.6*   Liver Function Tests:  Recent Labs Lab 02/02/15 2331  AST 45*  ALT 37  ALKPHOS 111  BILITOT 0.7  PROT 6.5  ALBUMIN 3.2*   CBC:  Recent Labs Lab 02/02/15 2331  WBC 14.0*  NEUTROABS 12.6*  HGB 10.9*  HCT 32.3*  MCV 92.9  PLT 207   CBG:  Recent Labs Lab 02/03/15 0343  GLUCAP 191*    Recent Results (from the past 240 hour(s))  Culture, blood (routine x 2)     Status: None (Preliminary result)   Collection Time: 02/02/15 11:31 PM  Result Value Ref Range Status   Specimen Description BLOOD  Final   Special Requests Normal  Final   Culture NO GROWTH < 12 HOURS  Final   Report Status PENDING  Incomplete  Culture, blood (routine x 2)     Status: None (Preliminary result)   Collection Time: 02/02/15 11:45 PM  Result Value Ref Range Status   Specimen Description BLOOD  Final   Special Requests Normal  Final   Culture NO GROWTH < 12 HOURS  Final   Report  Status PENDING  Incomplete     Studies: Dg Chest Port 1 View  02/02/2015  CLINICAL DATA:  81 year old female with shortness of breath. Initial encounter. COPD. EXAM: PORTABLE CHEST 1 VIEW COMPARISON:  04/20/2014 and earlier. FINDINGS: Portable AP upright view at 2328 hours. Chronic hiatal hernia is more apparent today. Chronic hyperinflation. No pneumothorax, pulmonary edema, pleural effusion or consolidation. Mildly increased peribronchial opacity at the medial right lung base. Stable cardiac size and mediastinal contours. Calcified aortic atherosclerosis. IMPRESSION: Chronic hyperinflation with mildly increased medial right lung base opacity compatible with acute infectious exacerbation. No pleural  effusion. Hiatal hernia. Electronically Signed   By: Genevie Ann M.D.   On: 02/02/2015 23:41    Scheduled Meds: . azithromycin  500 mg Intravenous Q24H  . cefTRIAXone (ROCEPHIN)  IV  1 g Intravenous Q24H  . heparin  5,000 Units Subcutaneous 3 times per day  . lisinopril  20 mg Oral Daily  . methylPREDNISolone (SOLU-MEDROL) injection  40 mg Intravenous Q12H  . mirtazapine  30 mg Oral QHS  . pantoprazole  40 mg Oral Daily  . pravastatin  40 mg Oral q1800  . sodium chloride  3 mL Intravenous Q12H    Assessment/Plan:  1. Clinical sepsis present on admission with pneumonia right lower lobe, tachycardia, leukocytosis, tachypnea and relative hypotension. Patient was given IV fluid hydration and started on Zithromax and Rocephin. 2. COPD exacerbation and tobacco abuse- smoking cessation counseling done 3 minutes by me refused nicotine patch. Since I'm not hearing wheezing in the lungs right now dropped the steroid dose to 40 mg IV every 12 hours 3. Gastroesophageal reflux disease without esophagitis with history of Barrett's esophagus on Protonix 4. Hyperlipidemia unspecified continue pravastatin 5. Anxiety and depression continue Remeron 6. Essential hypertension- on lisinopril  Code Status:     Code Status Orders        Start     Ordered   02/03/15 0752  Do not attempt resuscitation (DNR)   Continuous    Question Answer Comment  In the event of cardiac or respiratory ARREST Do not call a "code blue"   In the event of cardiac or respiratory ARREST Do not perform Intubation, CPR, defibrillation or ACLS   In the event of cardiac or respiratory ARREST Use medication by any route, position, wound care, and other measures to relive pain and suffering. May use oxygen, suction and manual treatment of airway obstruction as needed for comfort.      02/03/15 0751     Disposition Plan: Home soon  Antibiotics: - Rocephin and Zithromax  Time spent: 32 minutes  Loletha Grayer  [facility]  Hospitalists

## 2015-02-03 NOTE — H&P (Signed)
[facility] [place] NAME: Anna Prince    MR#:  [id number]  DATE OF BIRTH:  [birth year]  DATE OF ADMISSION:  02/02/2015  PRIMARY CARE PHYSICIAN: Crecencio Mc, MD   REQUESTING/REFERRING PHYSICIAN: Beather Arbour, M.D.  CHIEF COMPLAINT:   Chief Complaint  Patient presents with  . Cough    HISTORY OF PRESENT ILLNESS:  Anna Prince  is a 81 y.o. female who presents with cough and progressive shortness of breath. Cough is productive of yellow-brown sputum. Patient states the symptoms have been going on for about 5-6 days. She states that initially she thought was a virus (course, but that it wasn't getting better. In the ED she was found to have a leukocytosis with suspected pneumonia on chest x-ray. Patient has a known history of COPD. Hospitalists were called for admission for CPAP and COPD exacerbation.  PAST MEDICAL HISTORY:   Past Medical History  Diagnosis Date  . Barrett's esophagus 2007  . Sigmoid diverticulosis     by colonoscopy  . Aortic aneurysm ([place])   . Macular degeneration   . Anxiety   . Hyperlipidemia   . Hypertension   . Osteoporosis   . Lumbago   . Depression   . Bursitis 2012    left hip, improved with periodic steroid injection [facility] [place])  . Anemia 2012    of acute blood loss, resolved  . GERD (gastroesophageal reflux disease)     with Barretts Esophagus  . COPD (chronic obstructive pulmonary disease) ([place])     PAST SURGICAL HISTORY:   Past Surgical History  Procedure Laterality Date  . Appendectomy    . Back surgery      X2..1975 arachnoid cyst cervical region([place]);1997 lumbosacral tumor,9 hr surgery  . Abdominal aortic aneurysm repair  July 2010    [facility]    SOCIAL HISTORY:   Social History  Substance Use Topics  . Smoking status: Current Every Day Smoker    Types: Cigarettes  . Smokeless tobacco: Never Used     Comment: HAS BEEN SMOKING 30+ YEARS,RESUMED TOBACCO USE  AFTER AAA REPAIR  . Alcohol Use: No    FAMILY HISTORY:   Family History  Problem Relation Age of Onset  . Coronary artery disease Mother   . Coronary artery disease Father 75  . Cancer Neg Hx     no breast,coon or ovarian ca    DRUG ALLERGIES:   Allergies  Allergen Reactions  . Morphine Sulfate     ANALGESICS-OPIOID    MEDICATIONS AT HOME:   Prior to Admission medications   Medication Sig Start Date End Date Taking? Authorizing Provider  albuterol (PROVENTIL HFA;VENTOLIN HFA) 108 (90 BASE) MCG/ACT inhaler Inhale 2 puffs into the lungs every 6 (six) hours as needed for wheezing. 09/01/14  Yes Crecencio Mc, MD  alendronate (FOSAMAX) 70 MG tablet Take 1 tablet (70 mg total) by mouth every 7 (seven) days. Take with a full glass of water on an empty stomach. 11/25/14  Yes Crecencio Mc, MD  cyanocobalamin 2000 MCG tablet Take 2,000 mcg by mouth daily.   Yes Historical Provider, MD  diazepam (VALIUM) 5 MG tablet TAKE 1 TABLET BY MOUTH TWICE DAILY AS NEEDED 12/28/14  Yes Crecencio Mc, MD  HYDROcodone-acetaminophen (NORCO/VICODIN) 5-325 MG per tablet TAKE 1 TABLET BY MOUTH EVERY 6 HOURS as needed for back and hip pain 11/25/14  Yes Crecencio Mc, MD  lisinopril (PRINIVIL,ZESTRIL) 20 MG tablet TAKE 1  TABLET BY MOUTH EVERY DAY 09/18/14  Yes Crecencio Mc, MD  lovastatin (MEVACOR) 40 MG tablet TAKE 1 TABLET BY MOUTH EVERY DAY. 09/14/14  Yes Crecencio Mc, MD  meloxicam (MOBIC) 15 MG tablet Take 1 tablet (15 mg total) by mouth daily. 08/25/14  Yes Crecencio Mc, MD  mirtazapine (REMERON) 30 MG tablet TAKE 1 TABLET BY MOUTH EVERY NIGHT AT BEDTIME 08/23/14  Yes Crecencio Mc, MD  omeprazole (PRILOSEC) 40 MG capsule Take 1 capsule (40 mg total) by mouth daily. 05/31/14  Yes Crecencio Mc, MD  PARoxetine (PAXIL) 20 MG tablet Take 1 tablet (20 mg total) by mouth daily. 08/25/14  Yes Crecencio Mc, MD    REVIEW OF SYSTEMS:  Review of Systems  Constitutional: Positive for fever. Negative for chills,  weight loss and malaise/fatigue.  HENT: Negative for ear pain, hearing loss and tinnitus.   Eyes: Negative for blurred vision, double vision, pain and redness.  Respiratory: Positive for cough, sputum production and shortness of breath. Negative for hemoptysis.   Cardiovascular: Negative for chest pain, palpitations, orthopnea and leg swelling.  Gastrointestinal: Negative for nausea, vomiting, abdominal pain, diarrhea and constipation.  Genitourinary: Negative for dysuria, frequency and hematuria.  Musculoskeletal: Negative for back pain, joint pain and neck pain.  Skin:       No acne, rash, or lesions  Neurological: Negative for dizziness, tremors, focal weakness and weakness.  Endo/Heme/Allergies: Negative for polydipsia. Does not bruise/bleed easily.  Psychiatric/Behavioral: Negative for depression. The patient is not nervous/anxious and does not have insomnia.      VITAL SIGNS:   Filed Vitals:   02/02/15 2244 02/02/15 2350 02/03/15 0030 02/03/15 0200  BP: 85/56 97/60 103/61 109/60  Pulse: 107 80 83 84  Temp: 98.8 F (37.1 C)     TempSrc: Oral     Resp: 22 24 20 20   Height: 5\' 5"  (1.651 m)     Weight: 59.875 kg (132 lb)     SpO2: 93% 99% 97% 97%   Wt Readings from Last 3 Encounters:  02/02/15 59.875 kg (132 lb)  11/25/14 60.782 kg (134 lb)  08/25/14 58.401 kg (128 lb 12 oz)    PHYSICAL EXAMINATION:  Physical Exam  Vitals reviewed. Constitutional: She is oriented to person, place, and time. She appears well-developed and well-nourished. No distress.  HENT:  Head: Normocephalic and atraumatic.  Mouth/Throat: Oropharynx is clear and moist.  Eyes: Conjunctivae and EOM are normal. Pupils are equal, round, and reactive to light. No scleral icterus.  Neck: Normal range of motion. Neck supple. No JVD present. No thyromegaly present.  Cardiovascular: Normal rate, regular rhythm and intact distal pulses.  Exam reveals no gallop and no friction rub.   No murmur  heard. Respiratory: Effort normal and breath sounds normal. No respiratory distress. She has no wheezes. She has no rales.  GI: Soft. Bowel sounds are normal. She exhibits no distension. There is no tenderness.  Musculoskeletal: Normal range of motion. She exhibits no edema.  No arthritis, no gout  Lymphadenopathy:    She has no cervical adenopathy.  Neurological: She is alert and oriented to person, place, and time. No cranial nerve deficit.  No dysarthria, no aphasia  Skin: Skin is warm and dry. No rash noted. No erythema.  Psychiatric: She has a normal mood and affect. Her behavior is normal. Judgment and thought content normal.    LABORATORY PANEL:   CBC  Recent Labs Lab 02/02/15 2331  WBC 14.0*  HGB  10.9*  HCT 32.3*  PLT 207   ------------------------------------------------------------------------------------------------------------------  Chemistries   Recent Labs Lab 02/02/15 2331  NA 134*  K 4.6  CL 106  CO2 22  GLUCOSE 136*  BUN 29*  CREATININE 1.49*  CALCIUM 8.6*  AST 45*  ALT 37  ALKPHOS 111  BILITOT 0.7   ------------------------------------------------------------------------------------------------------------------  Cardiac Enzymes  Recent Labs Lab 02/02/15 2331  TROPONINI <0.03   ------------------------------------------------------------------------------------------------------------------  RADIOLOGY:  Dg Chest Port 1 View  02/02/2015  CLINICAL DATA:  81 year old female with shortness of breath. Initial encounter. COPD. EXAM: PORTABLE CHEST 1 VIEW COMPARISON:  04/20/2014 and earlier. FINDINGS: Portable AP upright view at 2328 hours. Chronic hiatal hernia is more apparent today. Chronic hyperinflation. No pneumothorax, pulmonary edema, pleural effusion or consolidation. Mildly increased peribronchial opacity at the medial right lung base. Stable cardiac size and mediastinal contours. Calcified aortic atherosclerosis. IMPRESSION: Chronic  hyperinflation with mildly increased medial right lung base opacity compatible with acute infectious exacerbation. No pleural effusion. Hiatal hernia. Electronically Signed   By: Genevie Ann M.D.   On: 02/02/2015 23:41    EKG:   Orders placed or performed during the hospital encounter of 02/02/15  . ED EKG  . ED EKG  . EKG 12-Lead  . EKG 12-Lead    IMPRESSION AND PLAN:  Principal Problem:   CAP (community acquired pneumonia) - continue IV antibiotics started in the ED. Monitor leukocytosis for improvement. Send sputum culture. Blood cultures sent in the ED. Active Problems:   COPD (chronic obstructive pulmonary disease) ([place]) - likely with some exacerbation due to her pneumonia. IV Solu-Medrol given in the ED. Continue this medication on admission.   AKI (acute kidney injury) ([place]) - unclear etiology, likely prerenal, will give gentle fluids overnight tonight and monitor for improvement.   Hypertension - currently controlled, continue home meds   Hyperlipidemia - on statin   GERD (gastroesophageal reflux disease) - equivalent home dose PPI   Vascular dementia of acute onset without behavioral disturbance - continue home meds  All the records are reviewed and case discussed with ED provider. Management plans discussed with the patient and/or family.  DVT PROPHYLAXIS: SubQ heparin  ADMISSION STATUS: Inpatient  CODE STATUS: Full  TOTAL TIME TAKING CARE OF THIS PATIENT: 45 minutes.    Jelisha Weed [place] 02/03/2015, 2:14 AM  [facility]  Office  [phone]  CC: Primary care physician; Crecencio Mc, MD

## 2015-02-03 NOTE — Care Management Note (Addendum)
Case Management Note  Patient Details  Name: Anna Prince MRN: [id number] Date of Birth: 1931/08/07  Subjective/Objective:                  Met with patient to discuss discharge planning. She is not requiring O2 and states that she is not on home O2. She states she has COPD. Her PCP is Dr. Derrel Nip. She states she cannot afford her inhalers, even with rx coverage. She has two daughters that she depends on for transportation. She states she is independent with mobility. She uses [facility] for Rx. She has meds at [facility] to pick up and states she will not need new Rx for these meds. Her daughter [name] has internet access.    Action/Plan: Referred patient to "needymeds.org" for Rx assistance and to speak with her MD about alternatives. No further RNCM needs.     Expected Discharge Date:                  Expected Discharge Plan:     In-House Referral:     Discharge planning Services  CM Consult, Medication Assistance (www.needymeds.org)  Post Acute Care Choice:    Choice offered to:  Patient  DME Arranged:  N/A DME Agency:     HH Arranged:    [place] Agency:     Status of Service:  Completed, signed off  Medicare Important Message Given:    Date Medicare IM Given:    Medicare IM give by:    Date Additional Medicare IM Given:    Additional Medicare Important Message give by:     If discussed at [place] of Stay Meetings, dates discussed:    Additional Comments:  Marshell Garfinkel, RN 02/03/2015, 1:33 PM

## 2015-02-03 NOTE — ED Notes (Signed)
Patient presents to ED with c/o shortness of breath and productive cough, states "orange-yellow" sputum. Patient is current smoker, hx of COPD. Pt denies chest pain, nausea, vomiting, or diarrhea. No increased work in breathing noted, patient alert and oriented x4. Skin warm and dry, family at bedside.

## 2015-02-03 NOTE — Progress Notes (Signed)
ANTIBIOTIC CONSULT NOTE - INITIAL  Pharmacy Consult for azithromycin/ceftriaxone Indication: pneumonia  Allergies  Allergen Reactions  . Morphine Sulfate     ANALGESICS-OPIOID    Patient Measurements: Height: 5\' 5"  (165.1 cm) Weight: 132 lb (59.875 kg) IBW/kg (Calculated) : 57 Adjusted Body Weight: 59.9 kg  Vital Signs: Temp: 98.8 F (37.1 C) (10/12 2244) Temp Source: Oral (10/12 2244) BP: 97/60 mmHg (10/12 2350) Pulse Rate: 80 (10/12 2350) Intake/Output from previous day:   Intake/Output from this shift:    Labs: No results for input(s): WBC, HGB, PLT, LABCREA, CREATININE in the last 72 hours. CrCl cannot be calculated (Patient has no serum creatinine result on file.). No results for input(s): VANCOTROUGH, VANCOPEAK, VANCORANDOM, GENTTROUGH, GENTPEAK, GENTRANDOM, TOBRATROUGH, TOBRAPEAK, TOBRARND, AMIKACINPEAK, AMIKACINTROU, AMIKACIN in the last 72 hours.   Microbiology: No results found for this or any previous visit (from the past 720 hour(s)).  Medical History: Past Medical History  Diagnosis Date  . Barrett's esophagus 2007  . Sigmoid diverticulosis     by colonoscopy  . Aortic aneurysm   . Macular degeneration   . Anxiety   . Hyperlipidemia   . Hypertension   . Osteoporosis   . Lumbago   . Depression   . Bursitis 2012    left hip, improved with periodic steroid injection [facility] [place])  . Anemia 2012    of acute blood loss, resolved  . GERD (gastroesophageal reflux disease)     with Barretts Esophagus    Medications:  Infusions:  . azithromycin    . cefTRIAXone (ROCEPHIN)  IV    . sodium chloride 1,000 mL (02/02/15 2335)   Assessment: 82 yof NAD, cold symptoms x 1 week with fever/sputum. Starting abx for CAP on CXR.  Goal of Therapy:  resolve infection  Plan:  Expected duration 7 days with resolution of temperature and/or normalization of WBC. Ceftriaxone 1 gm IV Q24H and azithromycin 500 mg IV Q24H. Will continue to follow for IV to PO  conversion.  Laural Benes, Pharm.D. Clinical Pharmacist 02/03/2015,12:14 AM

## 2015-02-03 NOTE — ED Notes (Addendum)
Dr. Posey Pronto, hospitalist notified of patient being lethargic, rectal temperature of 97.2 degrees F; verbal order given for 250 mL normal saline bolus.

## 2015-02-04 ENCOUNTER — Telehealth: Payer: Self-pay | Admitting: Internal Medicine

## 2015-02-04 LAB — EXPECTORATED SPUTUM ASSESSMENT W GRAM STAIN, RFLX TO RESP C

## 2015-02-04 LAB — EXPECTORATED SPUTUM ASSESSMENT W REFEX TO RESP CULTURE

## 2015-02-04 MED ORDER — PREDNISONE 5 MG PO TABS: ORAL_TABLET | ORAL | Status: DC

## 2015-02-04 MED ORDER — CEFUROXIME AXETIL 500 MG PO TABS
500.0000 mg | ORAL_TABLET | Freq: Two times a day (BID) | ORAL | Status: DC
Start: 2015-02-04 — End: 2015-02-04
  Filled 2015-02-04 (×2): qty 1

## 2015-02-04 MED ORDER — AZITHROMYCIN 250 MG PO TABS: ORAL_TABLET | ORAL | Status: DC

## 2015-02-04 MED ORDER — AZITHROMYCIN 250 MG PO TABS: 250.0000 mg | ORAL_TABLET | Freq: Every day | ORAL | Status: DC

## 2015-02-04 MED ORDER — FLUTICASONE PROPIONATE HFA 110 MCG/ACT IN AERO: 1.0000 | INHALATION_SPRAY | Freq: Two times a day (BID) | RESPIRATORY_TRACT | Status: DC

## 2015-02-04 MED ORDER — PREDNISONE 50 MG PO TABS
50.0000 mg | ORAL_TABLET | Freq: Once | ORAL | Status: AC
  Administered 2015-02-04: 50 mg via ORAL
  Filled 2015-02-04: qty 1

## 2015-02-04 MED ORDER — CEFUROXIME AXETIL 500 MG PO TABS: 500.0000 mg | ORAL_TABLET | Freq: Two times a day (BID) | ORAL | Status: DC

## 2015-02-04 NOTE — Telephone Encounter (Signed)
HFU for Copd.sepsis.hypotension, pt is being discharged today.. Please advise pt

## 2015-02-04 NOTE — Discharge Summary (Signed)
[facility] [place] NAME: Anna Prince    MR#:  [id number]  DATE OF BIRTH:  11-12-1931  DATE OF ADMISSION:  02/02/2015 ADMITTING PHYSICIAN: Lance Coon, MD  DATE OF DISCHARGE: 02/04/2015  PRIMARY CARE PHYSICIAN: Crecencio Mc, MD    ADMISSION DIAGNOSIS:  Community acquired pneumonia [J18.9] COPD exacerbation ([place]) [J44.1] Sepsis, due to unspecified organism ([place]) [A41.9] Hypotension, unspecified hypotension type [I95.9]  DISCHARGE DIAGNOSIS:  Principal Problem:   CAP (community acquired pneumonia) Active Problems:   Hyperlipidemia   Hypertension   COPD (chronic obstructive pulmonary disease) (HCC)   GERD (gastroesophageal reflux disease)   Vascular dementia of acute onset without behavioral disturbance   AKI (acute kidney injury) ([place])   SECONDARY DIAGNOSIS:   Past Medical History  Diagnosis Date  . Barrett's esophagus 2007  . Sigmoid diverticulosis     by colonoscopy  . Aortic aneurysm ([place])   . Macular degeneration   . Anxiety   . Hyperlipidemia   . Hypertension   . Osteoporosis   . Lumbago   . Depression   . Bursitis 2012    left hip, improved with periodic steroid injection [facility] [place])  . Anemia 2012    of acute blood loss, resolved  . GERD (gastroesophageal reflux disease)     with Barretts Esophagus  . COPD (chronic obstructive pulmonary disease) ([place])     HOSPITAL COURSE:   1. Clinical sepsis present on admission with right base pneumonia. Patient was given IV Rocephin and Zithromax while here in the hospital. Switched over to by mouth Ceftin and Zithromax upon discharge home. 2. COPD exacerbation- patient was started on IV steroids on presentation and the dose was decreased yesterday. Will send home on by mouth prednisone taper and a Flovent inhaler. 3. Acute kidney injury with dehydration- improved with hydration 4. Essential hypertension- stable on current medications 5. Hyper  lipidemia unspecified- continue lovastatin 6. Gastroesophageal reflux disease without esophagitis on PPI  DISCHARGE CONDITIONS:   Satisfactory  CONSULTS OBTAINED:  None  DRUG ALLERGIES:   Allergies  Allergen Reactions  . Morphine Sulfate     ANALGESICS-OPIOID    DISCHARGE MEDICATIONS:   Current Discharge Medication List    START taking these medications   Details  azithromycin (ZITHROMAX) 250 MG tablet One tablet at night daily for three more days Qty: 3 each, Refills: 0    cefUROXime (CEFTIN) 500 MG tablet Take 1 tablet (500 mg total) by mouth 2 (two) times daily with a meal. Qty: 16 tablet, Refills: 0    fluticasone (FLOVENT HFA) 110 MCG/ACT inhaler Inhale 1 puff into the lungs 2 (two) times daily. Qty: 1 Inhaler, Refills: 12    predniSONE (DELTASONE) 5 MG tablet 6 tabs po day1; 5 tabs po day2; 4 tabs po day3; 3 tabspo day4; 2 tabs po day5; 1 tab po day6,7 Qty: 22 tablet, Refills: 0      CONTINUE these medications which have NOT CHANGED   Details  albuterol (PROVENTIL HFA;VENTOLIN HFA) 108 (90 BASE) MCG/ACT inhaler Inhale 2 puffs into the lungs every 6 (six) hours as needed for wheezing. Qty: 8.5 g, Refills: 11    alendronate (FOSAMAX) 70 MG tablet Take 1 tablet (70 mg total) by mouth every 7 (seven) days. Take with a full glass of water on an empty stomach. Qty: 4 tablet, Refills: 11    cyanocobalamin 2000 MCG tablet Take 2,000 mcg by mouth daily.    diazepam (VALIUM) 5 MG  tablet TAKE 1 TABLET BY MOUTH TWICE DAILY AS NEEDED Qty: 60 tablet, Refills: 2    HYDROcodone-acetaminophen (NORCO/VICODIN) 5-325 MG per tablet TAKE 1 TABLET BY MOUTH EVERY 6 HOURS as needed for back and hip pain Qty: 60 tablet, Refills: 0   Associated Diagnoses: Chronic pain syndrome    lisinopril (PRINIVIL,ZESTRIL) 20 MG tablet TAKE 1 TABLET BY MOUTH EVERY DAY Qty: 90 tablet, Refills: 1    lovastatin (MEVACOR) 40 MG tablet TAKE 1 TABLET BY MOUTH EVERY DAY. Qty: 90 tablet, Refills: 1     meloxicam (MOBIC) 15 MG tablet Take 1 tablet (15 mg total) by mouth daily. Qty: 30 tablet, Refills: 5    mirtazapine (REMERON) 30 MG tablet TAKE 1 TABLET BY MOUTH EVERY NIGHT AT BEDTIME Qty: 30 tablet, Refills: 0    omeprazole (PRILOSEC) 40 MG capsule Take 1 capsule (40 mg total) by mouth daily. Qty: 90 capsule, Refills: 3    PARoxetine (PAXIL) 20 MG tablet Take 1 tablet (20 mg total) by mouth daily. Qty: 30 tablet, Refills: 5         DISCHARGE INSTRUCTIONS:   Follow-up with PMD as outpatient  If you experience worsening of your admission symptoms, develop shortness of breath, life threatening emergency, suicidal or homicidal thoughts you must seek medical attention immediately by calling 911 or calling your MD immediately  if symptoms less severe.  You Must read complete instructions/literature along with all the possible adverse reactions/side effects for all the Medicines you take and that have been prescribed to you. Take any new Medicines after you have completely understood and accept all the possible adverse reactions/side effects.   Please note  You were cared for by a hospitalist during your hospital stay. If you have any questions about your discharge medications or the care you received while you were in the hospital after you are discharged, you can call the unit and asked to speak with the hospitalist on call if the hospitalist that took care of you is not available. Once you are discharged, your primary care physician will handle any further medical issues. Please note that NO REFILLS for any discharge medications will be authorized once you are discharged, as it is imperative that you return to your primary care physician (or establish a relationship with a primary care physician if you do not have one) for your aftercare needs so that they can reassess your need for medications and monitor your lab values.    Today   CHIEF COMPLAINT:   Chief Complaint  Patient  presents with  . Cough    HISTORY OF PRESENT ILLNESS:  Anna Prince  is a 81 y.o. female with a known history of COPD presented with cough and was found to have pneumonia and COPD exacerbation   VITAL SIGNS:  Blood pressure 146/74, pulse 66, temperature 97.8 F (36.6 C), temperature source Oral, resp. rate 18, height 5\' 5"  (1.651 m), weight 62.415 kg (137 lb 9.6 oz), SpO2 98 %.    PHYSICAL EXAMINATION:  GENERAL:  81 y.o.-year-old patient lying in the bed with no acute distress.  EYES: Pupils equal, round, reactive to light and accommodation. No scleral icterus. Extraocular muscles intact.  HEENT: Head atraumatic, normocephalic. Oropharynx and nasopharynx clear.  NECK:  Supple, no jugular venous distention. No thyroid enlargement, no tenderness.  LUNGS: Equal breath sounds bilaterally, no wheezing or rales, scattered rhonchi. No use of accessory muscles of respiration.  CARDIOVASCULAR: S1, S2 normal. No murmurs, rubs, or gallops.  ABDOMEN: Soft, non-tender,  non-distended. Bowel sounds present. No organomegaly or mass.  EXTREMITIES: No pedal edema, cyanosis, or clubbing.  NEUROLOGIC: Cranial nerves II through XII are intact. Muscle strength 5/5 in all extremities. Sensation intact. Gait not checked.  PSYCHIATRIC: The patient is alert and oriented x 3.  SKIN: No obvious rash, lesion, or ulcer.   DATA REVIEW:   CBC  Recent Labs Lab 02/02/15 2331  WBC 14.0*  HGB 10.9*  HCT 32.3*  PLT 207    Chemistries   Recent Labs Lab 02/02/15 2331  NA 134*  K 4.6  CL 106  CO2 22  GLUCOSE 136*  BUN 29*  CREATININE 1.49*  CALCIUM 8.6*  AST 45*  ALT 37  ALKPHOS 111  BILITOT 0.7    Cardiac Enzymes  Recent Labs Lab 02/02/15 2331  TROPONINI <0.03    Microbiology Results  Results for orders placed or performed during the hospital encounter of 02/02/15  Culture, blood (routine x 2)     Status: None (Preliminary result)   Collection Time: 02/02/15 11:31 PM  Result Value  Ref Range Status   Specimen Description BLOOD  Final   Special Requests Normal  Final   Culture NO GROWTH 1 DAY  Final   Report Status PENDING  Incomplete  Culture, blood (routine x 2)     Status: None (Preliminary result)   Collection Time: 02/02/15 11:45 PM  Result Value Ref Range Status   Specimen Description BLOOD  Final   Special Requests Normal  Final   Culture NO GROWTH 1 DAY  Final   Report Status PENDING  Incomplete  Culture, sputum-assessment     Status: None (Preliminary result)   Collection Time: 02/04/15 12:15 AM  Result Value Ref Range Status   Specimen Description SPUTUM  Final   Special Requests NONE  Final   Sputum evaluation THIS SPECIMEN IS ACCEPTABLE FOR SPUTUM CULTURE  Final   Report Status PENDING  Incomplete    RADIOLOGY:  Dg Chest Port 1 View  02/02/2015  CLINICAL DATA:  81 year old female with shortness of breath. Initial encounter. COPD. EXAM: PORTABLE CHEST 1 VIEW COMPARISON:  04/20/2014 and earlier. FINDINGS: Portable AP upright view at 2328 hours. Chronic hiatal hernia is more apparent today. Chronic hyperinflation. No pneumothorax, pulmonary edema, pleural effusion or consolidation. Mildly increased peribronchial opacity at the medial right lung base. Stable cardiac size and mediastinal contours. Calcified aortic atherosclerosis. IMPRESSION: Chronic hyperinflation with mildly increased medial right lung base opacity compatible with acute infectious exacerbation. No pleural effusion. Hiatal hernia. Electronically Signed   By: Genevie Ann M.D.   On: 02/02/2015 23:41    Management plans discussed with the patient, and she is in agreement.  CODE STATUS:     Code Status Orders        Start     Ordered   02/03/15 0752  Do not attempt resuscitation (DNR)   Continuous    Question Answer Comment  In the event of cardiac or respiratory ARREST Do not call a "code blue"   In the event of cardiac or respiratory ARREST Do not perform Intubation, CPR, defibrillation  or ACLS   In the event of cardiac or respiratory ARREST Use medication by any route, position, wound care, and other measures to relive pain and suffering. May use oxygen, suction and manual treatment of airway obstruction as needed for comfort.      02/03/15 0751      TOTAL TIME TAKING CARE OF THIS PATIENT: 35 minutes.    Leslye Peer,  Yesika Rispoli M.D on 02/04/2015 at 9:01 AM  Between 7am to 6pm - Pager - [phone]  After 6pm go to [email or web address] - password [email or web address] [facility]  Office  [phone]  CC: Primary care physician; Crecencio Mc, MD

## 2015-02-07 ENCOUNTER — Telehealth: Payer: Self-pay

## 2015-02-07 LAB — CULTURE, RESPIRATORY

## 2015-02-07 LAB — CULTURE, RESPIRATORY W GRAM STAIN

## 2015-02-07 NOTE — Telephone Encounter (Signed)
7 days , Friday or Monday ok

## 2015-02-07 NOTE — Telephone Encounter (Signed)
TCM Call completed, I don't see any open slots on your schedule until the 31st? Please advise?

## 2015-02-07 NOTE — Telephone Encounter (Signed)
I will make TCM call, Please advise appointment?

## 2015-02-07 NOTE — Telephone Encounter (Signed)
You can use 11:30 on Thursday

## 2015-02-07 NOTE — Telephone Encounter (Signed)
Transition Care Management Follow-up Telephone Call  How have you been since you were released from the hospital? Weak, no other problems   Do you understand why you were in the hospital? Yes   Do you understand the discharge instrcutions? Yes  Items Reviewed:  Medications reviewed: Yes, steroids and   Allergies reviewed: No  Dietary changes reviewed: no  Referrals reviewed: No   Functional Questionnaire:   Activities of Daily Living (ADLs):   She states they are independent in the following: Independent States they require assistance with the following: Independent, Shower chair  Her daughters have her doing chores around the house (i.e emptying the dishwasher)   Any transportation issues/concerns?: No   Any patient concerns? No   Confirmed importance and date/time of follow-up visits scheduled: Awaiting Dr. Derrel Nip for Appointment time and date.    Confirmed with patient if condition begins to worsen call PCP or go to the ER.  Patient was given the Call-a-Nurse line [phone]: Yes

## 2015-02-08 LAB — CULTURE, BLOOD (ROUTINE X 2)
CULTURE: NO GROWTH
Culture: NO GROWTH
SPECIAL REQUESTS: NORMAL
SPECIAL REQUESTS: NORMAL

## 2015-02-08 NOTE — Telephone Encounter (Signed)
Scheduled

## 2015-02-10 ENCOUNTER — Encounter: Payer: Self-pay | Admitting: Internal Medicine

## 2015-02-10 ENCOUNTER — Ambulatory Visit (INDEPENDENT_AMBULATORY_CARE_PROVIDER_SITE_OTHER): Payer: Medicare Other | Admitting: Internal Medicine

## 2015-02-10 VITALS — BP 142/78 | HR 71 | Temp 98.6°F | Resp 14 | Ht 64.0 in | Wt 136.5 lb

## 2015-02-10 DIAGNOSIS — J441 Chronic obstructive pulmonary disease with (acute) exacerbation: Secondary | ICD-10-CM | POA: Diagnosis not present

## 2015-02-10 DIAGNOSIS — J189 Pneumonia, unspecified organism: Secondary | ICD-10-CM | POA: Diagnosis not present

## 2015-02-10 DIAGNOSIS — J181 Lobar pneumonia, unspecified organism: Secondary | ICD-10-CM

## 2015-02-10 DIAGNOSIS — N179 Acute kidney failure, unspecified: Secondary | ICD-10-CM

## 2015-02-10 DIAGNOSIS — B3781 Candidal esophagitis: Secondary | ICD-10-CM

## 2015-02-10 DIAGNOSIS — M25552 Pain in left hip: Secondary | ICD-10-CM

## 2015-02-10 DIAGNOSIS — J432 Centrilobular emphysema: Secondary | ICD-10-CM

## 2015-02-10 DIAGNOSIS — Z23 Encounter for immunization: Secondary | ICD-10-CM | POA: Diagnosis not present

## 2015-02-10 DIAGNOSIS — K5730 Diverticulosis of large intestine without perforation or abscess without bleeding: Secondary | ICD-10-CM

## 2015-02-10 DIAGNOSIS — A419 Sepsis, unspecified organism: Secondary | ICD-10-CM | POA: Diagnosis not present

## 2015-02-10 DIAGNOSIS — G894 Chronic pain syndrome: Secondary | ICD-10-CM

## 2015-02-10 DIAGNOSIS — R531 Weakness: Secondary | ICD-10-CM

## 2015-02-10 DIAGNOSIS — G8929 Other chronic pain: Secondary | ICD-10-CM

## 2015-02-10 LAB — BASIC METABOLIC PANEL
BUN: 25 mg/dL — AB (ref 6–23)
CO2: 28 mEq/L (ref 19–32)
CREATININE: 1.02 mg/dL (ref 0.40–1.20)
Calcium: 9.1 mg/dL (ref 8.4–10.5)
Chloride: 104 mEq/L (ref 96–112)
GFR: 55.28 mL/min — AB (ref >60.00)
Glucose, Bld: 76 mg/dL (ref 70–99)
Potassium: 4.6 mEq/L (ref 3.5–5.1)
Sodium: 140 mEq/L (ref 135–145)

## 2015-02-10 MED ORDER — HYDROCODONE-ACETAMINOPHEN 5-325 MG PO TABS: ORAL_TABLET | ORAL | Status: DC

## 2015-02-10 MED ORDER — NYSTATIN 100000 UNIT/ML MT SUSP: 500000.0000 [IU] | Freq: Four times a day (QID) | OROMUCOSAL | Status: DC

## 2015-02-10 NOTE — Progress Notes (Signed)
Pre-visit discussion using our clinic review tool. No additional management support is needed unless otherwise documented below in the visit note.  

## 2015-02-10 NOTE — Patient Instructions (Signed)
I am treating you for candida (yeast) esophagitis with Nystatin swish and swallow 4 times daily for 6 days  I am ordering home PT to help you work on regaining your strength   Repeat chest x ray is needed after November 25th

## 2015-02-10 NOTE — Progress Notes (Signed)
Subjective:  Patient ID: Anna Prince, female    DOB: 01/09/1932  Age: 81 y.o. MRN: [id number]  CC: The primary encounter diagnosis was Generalized weakness. Diagnoses of Candida esophagitis ([place]), Acute renal failure, unspecified acute renal failure type ([place]), Lobar pneumonia, unspecified organism ([place]), Chronic pain syndrome, Encounter for immunization, CAP (community acquired pneumonia), Centrilobular emphysema ([place]), Chronic left hip pain, and Diverticulosis, sigmoid were also pertinent to this visit.  HPI SARGUN RUMMELL presents for hospital follow up.  Patient was admitted to [facility] on Oct 12 th and discharged on Oct 14th .  Dx was sepsis with Community acquired PNA (right lobar) and COPD exacerbation.  Blood cultures were negative.  She was also noted to be suffering from acute renal failure which responded to IV fluids She is still smoking,  30+ years. .  Had PT evaluation during hospitalization and was considered not to be in need of PT.  PT was not ordered at D/c.  Daughter has noted decreased balance and generalized weakness.  She has been having trouble transitioning to standing from a seated position.   She has finished the prednisone taper and the antibiotics; she  is not using steroid inhaler  due to cost.   She has had no diarrhea,  And is moving bowels daily    Outpatient Prescriptions Prior to Visit  Medication Sig Dispense Refill  . albuterol (PROVENTIL HFA;VENTOLIN HFA) 108 (90 BASE) MCG/ACT inhaler Inhale 2 puffs into the lungs every 6 (six) hours as needed for wheezing. 8.5 g 11  . alendronate (FOSAMAX) 70 MG tablet Take 1 tablet (70 mg total) by mouth every 7 (seven) days. Take with a full glass of water on an empty stomach. 4 tablet 11  . cefUROXime (CEFTIN) 500 MG tablet Take 1 tablet (500 mg total) by mouth 2 (two) times daily with a meal. 16 tablet 0  . cyanocobalamin 2000 MCG tablet Take 2,000 mcg by mouth daily.    . diazepam (VALIUM) 5 MG tablet TAKE 1  TABLET BY MOUTH TWICE DAILY AS NEEDED 60 tablet 2  . fluticasone (FLOVENT HFA) 110 MCG/ACT inhaler Inhale 1 puff into the lungs 2 (two) times daily. 1 Inhaler 12  . lisinopril (PRINIVIL,ZESTRIL) 20 MG tablet TAKE 1 TABLET BY MOUTH EVERY DAY 90 tablet 1  . lovastatin (MEVACOR) 40 MG tablet TAKE 1 TABLET BY MOUTH EVERY DAY. 90 tablet 1  . meloxicam (MOBIC) 15 MG tablet Take 1 tablet (15 mg total) by mouth daily. 30 tablet 5  . mirtazapine (REMERON) 30 MG tablet TAKE 1 TABLET BY MOUTH EVERY NIGHT AT BEDTIME 30 tablet 0  . omeprazole (PRILOSEC) 40 MG capsule Take 1 capsule (40 mg total) by mouth daily. 90 capsule 3  . PARoxetine (PAXIL) 20 MG tablet Take 1 tablet (20 mg total) by mouth daily. 30 tablet 5  . HYDROcodone-acetaminophen (NORCO/VICODIN) 5-325 MG per tablet TAKE 1 TABLET BY MOUTH EVERY 6 HOURS as needed for back and hip pain 60 tablet 0  . azithromycin (ZITHROMAX) 250 MG tablet One tablet at night daily for three more days (Patient not taking: Reported on 02/10/2015) 3 each 0  . predniSONE (DELTASONE) 5 MG tablet 6 tabs po day1; 5 tabs po day2; 4 tabs po day3; 3 tabspo day4; 2 tabs po day5; 1 tab po day6,7 (Patient not taking: Reported on 02/10/2015) 22 tablet 0   No facility-administered medications prior to visit.    Review of Systems;  Patient denies headache, fevers, malaise, unintentional weight loss,  skin rash, eye pain, sinus congestion and sinus pain, sore throat, dysphagia,  hemoptysis , cough, dyspnea, wheezing, chest pain, palpitations, orthopnea, edema, abdominal pain, nausea, melena, diarrhea, constipation, flank pain, dysuria, hematuria, urinary  Frequency, nocturia, numbness, tingling, seizures,  Focal weakness, Loss of consciousness,  Tremor, insomnia, depression, anxiety, and suicidal ideation.      Objective:  BP 142/78 mmHg  Pulse 71  Temp(Src) 98.6 F (37 C) (Oral)  Resp 14  Ht 5\' 4"  (1.626 m)  Wt 136 lb 8 oz (61.916 kg)  BMI 23.42 kg/m2  SpO2 99%  BP  Readings from Last 3 Encounters:  02/10/15 142/78  02/04/15 146/74  11/25/14 122/84    Wt Readings from Last 3 Encounters:  02/10/15 136 lb 8 oz (61.916 kg)  02/03/15 137 lb 9.6 oz (62.415 kg)  11/25/14 134 lb (60.782 kg)    General appearance: alert, cooperative and appears stated age Ears: normal TM's and external ear canals both ears Throat: lips, mucosa, and tongue normal; teeth and gums normal Neck: no adenopathy, no carotid bruit, supple, symmetrical, trachea midline and thyroid not enlarged, symmetric, no tenderness/mass/nodules Back: symmetric, no curvature. ROM normal. No CVA tenderness. Lungs: clear to auscultation bilaterally Heart: regular rate and rhythm, S1, S2 normal, no murmur, click, rub or gallop Abdomen: soft, non-tender; bowel sounds normal; no masses,  no organomegaly Pulses: 2+ and symmetric Skin: Skin color, texture, turgor normal. No rashes or lesions Lymph nodes: Cervical, supraclavicular, and axillary nodes normal.  No results found for: HGBA1C  Lab Results  Component Value Date   CREATININE 1.02 02/10/2015   CREATININE 1.49* 02/02/2015   CREATININE 1.07 12/01/2014    Lab Results  Component Value Date   WBC 14.0* 02/02/2015   HGB 10.9* 02/02/2015   HCT 32.3* 02/02/2015   PLT 207 02/02/2015   GLUCOSE 76 02/10/2015   CHOL 169 08/24/2014   TRIG 91.0 08/24/2014   HDL 66.10 08/24/2014   LDLDIRECT 94.0 11/25/2014   LDLCALC 85 08/24/2014   ALT 37 02/02/2015   AST 45* 02/02/2015   NA 140 02/10/2015   K 4.6 02/10/2015   CL 104 02/10/2015   CREATININE 1.02 02/10/2015   BUN 25* 02/10/2015   CO2 28 02/10/2015   TSH 0.54 08/24/2014   INR 1.0 04/17/2014    Dg Chest Port 1 View  02/02/2015  CLINICAL DATA:  81 year old female with shortness of breath. Initial encounter. COPD. EXAM: PORTABLE CHEST 1 VIEW COMPARISON:  04/20/2014 and earlier. FINDINGS: Portable AP upright view at 2328 hours. Chronic hiatal hernia is more apparent today. Chronic  hyperinflation. No pneumothorax, pulmonary edema, pleural effusion or consolidation. Mildly increased peribronchial opacity at the medial right lung base. Stable cardiac size and mediastinal contours. Calcified aortic atherosclerosis. IMPRESSION: Chronic hyperinflation with mildly increased medial right lung base opacity compatible with acute infectious exacerbation. No pleural effusion. Hiatal hernia. Electronically Signed   By: Genevie Ann M.D.   On: 02/02/2015 23:41    Assessment & Plan:   Problem List Items Addressed This Visit    Diverticulosis, sigmoid    Secondary to osteoporotic fractures. Refill on vicodin is not due until Nov 4 and will be given with explicit instructions mutually agreed upon to limit to  2 daily.  Use tylenol, meloxicam.         COPD (chronic obstructive pulmonary disease) ([place])    She cannot afford steroid or LABA inhaled therapy.  She has albuterol MDI.  She is still smoking and has no plans to quit.  Chronic left hip pain    She has mild DJD and spurring by Dec 2015 plain films.  Her pain is aggravated by rising from seated position and is limiting her activity,  despite use of vicodin  , meloxicam and tylenol.  Her recent hospitalization has aggravated her weakness.  Referral for PT to improve mobility has been ordered. She is unable to to ambulate safely without assistance and not drive.         CAP (community acquired pneumonia)    Right lobar,  Resolved clinically and on exam.  Follow up CXR in 6 weeks to ensure clearing of infiltrate .       Candida esophagitis (HCC)    Treating with nystatin oral suspension      Relevant Medications   nystatin (MYCOSTATIN) 100000 UNIT/ML suspension   Generalized weakness - Primary   Relevant Orders   Ambulatory referral to [facility]   Chronic pain syndrome   Relevant Medications   HYDROcodone-acetaminophen (NORCO/VICODIN) 5-325 MG tablet    Other Visit Diagnoses    Acute renal failure, unspecified acute  renal failure type ([place])        Relevant Orders    Basic metabolic panel (Completed)    Lobar pneumonia, unspecified organism [facility])        Relevant Orders    DG Chest 2 View    Encounter for immunization           I have discontinued Ms. Rosko's azithromycin and predniSONE. I am also having her start on nystatin. Additionally, I am having her maintain her cyanocobalamin, omeprazole, mirtazapine, meloxicam, PARoxetine, albuterol, lovastatin, lisinopril, alendronate, diazepam, fluticasone, cefUROXime, and HYDROcodone-acetaminophen.  Meds ordered this encounter  Medications  . nystatin (MYCOSTATIN) 100000 UNIT/ML suspension    Sig: Take 5 mLs (500,000 Units total) by mouth 4 (four) times daily.    Dispense:  120 mL    Refill:  1  . DISCONTD: HYDROcodone-acetaminophen (NORCO/VICODIN) 5-325 MG tablet    Sig: TAKE 1 TABLET BY MOUTH EVERY 6 HOURS as needed for back and hip pain    Dispense:  60 tablet    Refill:  0    Maximum two daily.  Refill on or after February 25 2015  . HYDROcodone-acetaminophen (NORCO/VICODIN) 5-325 MG tablet    Sig: TAKE 1 TABLET BY MOUTH EVERY 6 HOURS as needed for back and hip pain    Dispense:  60 tablet    Refill:  0    Maximum two daily.  Refill on or after March 27 2015    Medications Discontinued During This Encounter  Medication Reason  . azithromycin (ZITHROMAX) 250 MG tablet   . predniSONE (DELTASONE) 5 MG tablet   . HYDROcodone-acetaminophen (NORCO/VICODIN) 5-325 MG per tablet Reorder  . HYDROcodone-acetaminophen (NORCO/VICODIN) 5-325 MG tablet Reorder    Follow-up: No Follow-up on file.   Crecencio Mc, MD

## 2015-02-12 DIAGNOSIS — R531 Weakness: Secondary | ICD-10-CM | POA: Insufficient documentation

## 2015-02-12 NOTE — Assessment & Plan Note (Signed)
Right lobar,  Resolved clinically and on exam.  Follow up CXR in 6 weeks to ensure clearing of infiltrate .

## 2015-02-12 NOTE — Assessment & Plan Note (Signed)
She has mild DJD and spurring by Dec 2015 plain films.  Her pain is aggravated by rising from seated position and is limiting her activity,  despite use of vicodin  , meloxicam and tylenol.  Her recent hospitalization has aggravated her weakness.  Referral for PT to improve mobility has been ordered. She is unable to to ambulate safely without assistance and not drive.

## 2015-02-12 NOTE — Assessment & Plan Note (Signed)
Treating with nystatin oral suspension

## 2015-02-12 NOTE — Assessment & Plan Note (Signed)
She cannot afford steroid or LABA inhaled therapy.  She has albuterol MDI.  She is still smoking and has no plans to quit.

## 2015-02-12 NOTE — Assessment & Plan Note (Addendum)
Secondary to osteoporotic fractures. Refill on vicodin is not due until Nov 4 and will be given with explicit instructions mutually agreed upon to limit to  2 daily.  Use tylenol, meloxicam.

## 2015-02-14 ENCOUNTER — Encounter: Payer: Self-pay | Admitting: *Deleted

## 2015-02-14 ENCOUNTER — Other Ambulatory Visit: Payer: Self-pay | Admitting: Internal Medicine

## 2015-02-25 ENCOUNTER — Ambulatory Visit: Payer: Medicare Other | Admitting: Internal Medicine

## 2015-03-12 ENCOUNTER — Other Ambulatory Visit: Payer: Self-pay | Admitting: Internal Medicine

## 2015-03-16 ENCOUNTER — Other Ambulatory Visit: Payer: Self-pay | Admitting: Internal Medicine

## 2015-04-06 ENCOUNTER — Other Ambulatory Visit: Payer: Self-pay | Admitting: Internal Medicine

## 2015-04-06 NOTE — Telephone Encounter (Signed)
Ok to refill,  printed rx .  Needs 6 month follow up in Feb

## 2015-04-07 ENCOUNTER — Other Ambulatory Visit: Payer: Self-pay | Admitting: Internal Medicine

## 2015-04-07 ENCOUNTER — Telehealth: Payer: Self-pay | Admitting: Internal Medicine

## 2015-04-07 ENCOUNTER — Other Ambulatory Visit: Payer: Self-pay

## 2015-04-07 DIAGNOSIS — G894 Chronic pain syndrome: Secondary | ICD-10-CM

## 2015-04-07 MED ORDER — HYDROCODONE-ACETAMINOPHEN 5-325 MG PO TABS: ORAL_TABLET | ORAL | Status: DC

## 2015-04-07 NOTE — Telephone Encounter (Signed)
notifed pt that Rx refill was filled, pt has appointment set for 04/28/2015. tvw

## 2015-04-07 NOTE — Progress Notes (Unsigned)
Patient daughter not aware and advised that  She will check with pharmacy and will advise if December refill is needed or January is needed due to the holidays.

## 2015-04-07 NOTE — Telephone Encounter (Signed)
Pt's daughter called. The prescription that her mother was talking about is for January not December.   Thank you

## 2015-04-07 NOTE — Telephone Encounter (Signed)
Patient was asking for January due to the holidays.

## 2015-04-07 NOTE — Telephone Encounter (Signed)
Kathy, Please advise?  

## 2015-04-09 ENCOUNTER — Observation Stay
Admission: EM | Admit: 2015-04-09 | Discharge: 2015-04-10 | Disposition: A | Discharge status: Home / Self Care | Payer: Medicare Other | Attending: Internal Medicine | Admitting: Internal Medicine

## 2015-04-09 ENCOUNTER — Emergency Department: Payer: Medicare Other

## 2015-04-09 DIAGNOSIS — R42 Dizziness and giddiness: Secondary | ICD-10-CM | POA: Insufficient documentation

## 2015-04-09 DIAGNOSIS — Z8701 Personal history of pneumonia (recurrent): Secondary | ICD-10-CM | POA: Insufficient documentation

## 2015-04-09 DIAGNOSIS — A419 Sepsis, unspecified organism: Secondary | ICD-10-CM | POA: Diagnosis present

## 2015-04-09 DIAGNOSIS — J449 Chronic obstructive pulmonary disease, unspecified: Secondary | ICD-10-CM | POA: Insufficient documentation

## 2015-04-09 DIAGNOSIS — F419 Anxiety disorder, unspecified: Secondary | ICD-10-CM | POA: Insufficient documentation

## 2015-04-09 DIAGNOSIS — M25552 Pain in left hip: Secondary | ICD-10-CM | POA: Diagnosis not present

## 2015-04-09 DIAGNOSIS — E785 Hyperlipidemia, unspecified: Secondary | ICD-10-CM | POA: Diagnosis not present

## 2015-04-09 DIAGNOSIS — Z79891 Long term (current) use of opiate analgesic: Secondary | ICD-10-CM | POA: Diagnosis not present

## 2015-04-09 DIAGNOSIS — F17210 Nicotine dependence, cigarettes, uncomplicated: Secondary | ICD-10-CM | POA: Diagnosis not present

## 2015-04-09 DIAGNOSIS — A084 Viral intestinal infection, unspecified: Secondary | ICD-10-CM | POA: Diagnosis not present

## 2015-04-09 DIAGNOSIS — G894 Chronic pain syndrome: Secondary | ICD-10-CM | POA: Diagnosis not present

## 2015-04-09 DIAGNOSIS — I10 Essential (primary) hypertension: Secondary | ICD-10-CM | POA: Diagnosis not present

## 2015-04-09 DIAGNOSIS — I719 Aortic aneurysm of unspecified site, without rupture: Secondary | ICD-10-CM | POA: Diagnosis not present

## 2015-04-09 DIAGNOSIS — K449 Diaphragmatic hernia without obstruction or gangrene: Secondary | ICD-10-CM | POA: Diagnosis not present

## 2015-04-09 DIAGNOSIS — R531 Weakness: Secondary | ICD-10-CM | POA: Diagnosis not present

## 2015-04-09 DIAGNOSIS — K219 Gastro-esophageal reflux disease without esophagitis: Secondary | ICD-10-CM | POA: Diagnosis not present

## 2015-04-09 DIAGNOSIS — H3530 Unspecified macular degeneration: Secondary | ICD-10-CM | POA: Insufficient documentation

## 2015-04-09 DIAGNOSIS — R197 Diarrhea, unspecified: Secondary | ICD-10-CM

## 2015-04-09 DIAGNOSIS — R112 Nausea with vomiting, unspecified: Secondary | ICD-10-CM | POA: Diagnosis present

## 2015-04-09 DIAGNOSIS — Z791 Long term (current) use of non-steroidal anti-inflammatories (NSAID): Secondary | ICD-10-CM | POA: Insufficient documentation

## 2015-04-09 DIAGNOSIS — N179 Acute kidney failure, unspecified: Secondary | ICD-10-CM | POA: Insufficient documentation

## 2015-04-09 DIAGNOSIS — E860 Dehydration: Secondary | ICD-10-CM | POA: Diagnosis not present

## 2015-04-09 DIAGNOSIS — Z79899 Other long term (current) drug therapy: Secondary | ICD-10-CM | POA: Diagnosis not present

## 2015-04-09 DIAGNOSIS — F329 Major depressive disorder, single episode, unspecified: Secondary | ICD-10-CM | POA: Insufficient documentation

## 2015-04-09 HISTORY — DX: Unspecified osteoarthritis, unspecified site: M19.90

## 2015-04-09 LAB — GASTROINTESTINAL PANEL BY PCR, STOOL (REPLACES STOOL CULTURE)
Adenovirus F40/41: NOT DETECTED
Astrovirus: NOT DETECTED
CYCLOSPORA CAYETANENSIS: NOT DETECTED
Campylobacter species: NOT DETECTED
Cryptosporidium: NOT DETECTED
E. coli O157: NOT DETECTED
ENTAMOEBA HISTOLYTICA: NOT DETECTED
Enteroaggregative E coli (EAEC): NOT DETECTED
Enteropathogenic E coli (EPEC): NOT DETECTED
Enterotoxigenic E coli (ETEC): NOT DETECTED
Giardia lamblia: NOT DETECTED
NOROVIRUS GI/GII: NOT DETECTED
PLESIMONAS SHIGELLOIDES: NOT DETECTED
Rotavirus A: NOT DETECTED
SALMONELLA SPECIES: NOT DETECTED
SAPOVIRUS (I, II, IV, AND V): NOT DETECTED
SHIGA LIKE TOXIN PRODUCING E COLI (STEC): NOT DETECTED
Shigella/Enteroinvasive E coli (EIEC): NOT DETECTED
Vibrio cholerae: NOT DETECTED
Vibrio species: NOT DETECTED
YERSINIA ENTEROCOLITICA: NOT DETECTED

## 2015-04-09 LAB — CBC WITH DIFFERENTIAL/PLATELET
Basophils Absolute: 0 10*3/uL (ref 0–0.1)
Basophils Relative: 0 %
EOS ABS: 0 10*3/uL (ref 0–0.7)
EOS PCT: 0 %
HCT: 38.6 % (ref 35.0–47.0)
Hemoglobin: 12.6 g/dL (ref 12.0–16.0)
LYMPHS ABS: 0.3 10*3/uL — AB (ref 1.0–3.6)
Lymphocytes Relative: 3 %
MCH: 30.8 pg (ref 26.0–34.0)
MCHC: 32.5 g/dL (ref 32.0–36.0)
MCV: 94.9 fL (ref 80.0–100.0)
MONOS PCT: 3 %
Monocytes Absolute: 0.3 10*3/uL (ref 0.2–0.9)
Neutro Abs: 10.2 10*3/uL — ABNORMAL HIGH (ref 1.4–6.5)
Neutrophils Relative %: 94 %
PLATELETS: 136 10*3/uL — AB (ref 150–440)
RBC: 4.07 MIL/uL (ref 3.80–5.20)
RDW: 13.9 % (ref 11.5–14.5)
WBC: 10.9 10*3/uL (ref 3.6–11.0)

## 2015-04-09 LAB — C DIFFICILE QUICK SCREEN W PCR REFLEX
C DIFFICILE (CDIFF) INTERP: NEGATIVE
C DIFFICILE (CDIFF) TOXIN: NEGATIVE
C DIFFICLE (CDIFF) ANTIGEN: NEGATIVE

## 2015-04-09 LAB — URINALYSIS COMPLETE WITH MICROSCOPIC (ARMC ONLY)
BACTERIA UA: NONE SEEN
BILIRUBIN URINE: NEGATIVE
Glucose, UA: NEGATIVE mg/dL
Ketones, ur: NEGATIVE mg/dL
NITRITE: NEGATIVE
PH: 8 (ref 5.0–8.0)
Protein, ur: NEGATIVE mg/dL
SPECIFIC GRAVITY, URINE: 1.012 (ref 1.005–1.030)

## 2015-04-09 LAB — COMPREHENSIVE METABOLIC PANEL
ALBUMIN: 4.2 g/dL (ref 3.5–5.0)
ALK PHOS: 76 U/L (ref 38–126)
ALT: 15 U/L (ref 14–54)
ANION GAP: 8 (ref 5–15)
AST: 24 U/L (ref 15–41)
BILIRUBIN TOTAL: 1 mg/dL (ref 0.3–1.2)
BUN: 22 mg/dL — AB (ref 6–20)
CALCIUM: 9 mg/dL (ref 8.9–10.3)
CO2: 25 mmol/L (ref 22–32)
Chloride: 106 mmol/L (ref 101–111)
Creatinine, Ser: 1.18 mg/dL — ABNORMAL HIGH (ref 0.44–1.00)
GFR calc Af Amer: 49 mL/min — ABNORMAL LOW (ref >60)
GFR, EST NON AFRICAN AMERICAN: 42 mL/min — AB (ref >60)
GLUCOSE: 150 mg/dL — AB (ref 65–99)
Potassium: 4.2 mmol/L (ref 3.5–5.1)
Sodium: 139 mmol/L (ref 135–145)
TOTAL PROTEIN: 7.1 g/dL (ref 6.5–8.1)

## 2015-04-09 LAB — OSMOLALITY: Osmolality: 288 mOsm/kg (ref 275–295)

## 2015-04-09 LAB — LIPASE, BLOOD: Lipase: 36 U/L (ref 11–51)

## 2015-04-09 LAB — PROTIME-INR
INR: 0.92
PROTHROMBIN TIME: 12.6 s (ref 11.4–15.0)

## 2015-04-09 LAB — TSH: TSH: 0.484 u[IU]/mL (ref 0.350–4.500)

## 2015-04-09 LAB — APTT

## 2015-04-09 LAB — TROPONIN I: Troponin I: <0.03 ng/mL (ref <0.031)

## 2015-04-09 LAB — LACTIC ACID, PLASMA: Lactic Acid, Venous: 1.5 mmol/L (ref 0.5–2.0)

## 2015-04-09 MED ORDER — NYSTATIN 100000 UNIT/ML MT SUSP
500000.0000 [IU] | Freq: Four times a day (QID) | OROMUCOSAL | Status: DC
  Administered 2015-04-09 – 2015-04-10 (×3): 500000 [IU] via ORAL
  Filled 2015-04-09 (×3): qty 5

## 2015-04-09 MED ORDER — DIAZEPAM 5 MG PO TABS: 5.0000 mg | ORAL_TABLET | Freq: Two times a day (BID) | ORAL | Status: DC | PRN

## 2015-04-09 MED ORDER — PRAVASTATIN SODIUM 20 MG PO TABS: 40.0000 mg | ORAL_TABLET | Freq: Every day | ORAL | Status: DC

## 2015-04-09 MED ORDER — SODIUM CHLORIDE 0.9 % IJ SOLN
3.0000 mL | Freq: Two times a day (BID) | INTRAMUSCULAR | Status: DC
  Administered 2015-04-09: 3 mL via INTRAVENOUS

## 2015-04-09 MED ORDER — LISINOPRIL 20 MG PO TABS
20.0000 mg | ORAL_TABLET | Freq: Every day | ORAL | Status: DC
  Administered 2015-04-09 – 2015-04-10 (×2): 20 mg via ORAL
  Filled 2015-04-09 (×2): qty 1

## 2015-04-09 MED ORDER — SODIUM CHLORIDE 0.9 % IV SOLN
Freq: Once | INTRAVENOUS | Status: AC
  Administered 2015-04-09: 05:00:00 via INTRAVENOUS

## 2015-04-09 MED ORDER — VANCOMYCIN HCL IN DEXTROSE 750-5 MG/150ML-% IV SOLN
750.0000 mg | INTRAVENOUS | Status: DC
  Filled 2015-04-09: qty 150

## 2015-04-09 MED ORDER — PAROXETINE HCL 10 MG PO TABS: 20.0000 mg | ORAL_TABLET | Freq: Every day | ORAL | Status: DC

## 2015-04-09 MED ORDER — PIPERACILLIN-TAZOBACTAM 4.5 G IVPB
4.5000 g | Freq: Three times a day (TID) | INTRAVENOUS | Status: DC
  Filled 2015-04-09 (×3): qty 100

## 2015-04-09 MED ORDER — VITAMIN B-12 1000 MCG PO TABS
2000.0000 ug | ORAL_TABLET | Freq: Every day | ORAL | Status: DC
Start: 2015-04-09 — End: 2015-04-10
  Administered 2015-04-10: 2000 ug via ORAL
  Filled 2015-04-09: qty 2

## 2015-04-09 MED ORDER — ONDANSETRON HCL 4 MG/2ML IJ SOLN
4.0000 mg | Freq: Four times a day (QID) | INTRAMUSCULAR | Status: DC | PRN
  Administered 2015-04-09: 4 mg via INTRAVENOUS
  Filled 2015-04-09: qty 2

## 2015-04-09 MED ORDER — PANTOPRAZOLE SODIUM 40 MG PO TBEC
40.0000 mg | DELAYED_RELEASE_TABLET | Freq: Every day | ORAL | Status: DC
  Administered 2015-04-09 – 2015-04-10 (×2): 40 mg via ORAL
  Filled 2015-04-09 (×2): qty 1

## 2015-04-09 MED ORDER — ALUM & MAG HYDROXIDE-SIMETH 200-200-20 MG/5ML PO SUSP: 30.0000 mL | ORAL | Status: DC | PRN

## 2015-04-09 MED ORDER — BUDESONIDE 0.25 MG/2ML IN SUSP
0.2500 mg | Freq: Two times a day (BID) | RESPIRATORY_TRACT | Status: DC
  Administered 2015-04-09 – 2015-04-10 (×3): 0.25 mg via RESPIRATORY_TRACT
  Filled 2015-04-09 (×3): qty 2

## 2015-04-09 MED ORDER — HEPARIN SODIUM (PORCINE) 5000 UNIT/ML IJ SOLN
5000.0000 [IU] | Freq: Three times a day (TID) | INTRAMUSCULAR | Status: DC
  Administered 2015-04-09 – 2015-04-10 (×3): 5000 [IU] via SUBCUTANEOUS
  Filled 2015-04-09 (×3): qty 1

## 2015-04-09 MED ORDER — ONDANSETRON HCL 4 MG PO TABS: 4.0000 mg | ORAL_TABLET | Freq: Four times a day (QID) | ORAL | Status: DC | PRN

## 2015-04-09 MED ORDER — CETYLPYRIDINIUM CHLORIDE 0.05 % MT LIQD
7.0000 mL | Freq: Two times a day (BID) | OROMUCOSAL | Status: DC
  Administered 2015-04-09 – 2015-04-10 (×2): 7 mL via OROMUCOSAL

## 2015-04-09 MED ORDER — PIPERACILLIN-TAZOBACTAM 3.375 G IVPB 30 MIN
3.3750 g | Freq: Once | INTRAVENOUS | Status: AC
  Administered 2015-04-09: 3.375 g via INTRAVENOUS
  Filled 2015-04-09: qty 50

## 2015-04-09 MED ORDER — ACETAMINOPHEN 325 MG PO TABS
650.0000 mg | ORAL_TABLET | Freq: Four times a day (QID) | ORAL | Status: DC | PRN
Start: 2015-04-09 — End: 2015-04-10

## 2015-04-09 MED ORDER — MIRTAZAPINE 15 MG PO TABS
30.0000 mg | ORAL_TABLET | Freq: Every day | ORAL | Status: DC
Start: 2015-04-09 — End: 2015-04-10
  Administered 2015-04-09: 30 mg via ORAL
  Filled 2015-04-09: qty 2

## 2015-04-09 MED ORDER — FLUTICASONE PROPIONATE HFA 110 MCG/ACT IN AERO: 1.0000 | INHALATION_SPRAY | Freq: Two times a day (BID) | RESPIRATORY_TRACT | Status: DC

## 2015-04-09 MED ORDER — ACETAMINOPHEN 650 MG RE SUPP: 650.0000 mg | Freq: Four times a day (QID) | RECTAL | Status: DC | PRN

## 2015-04-09 MED ORDER — SODIUM CHLORIDE 0.9 % IV BOLUS (SEPSIS)
1000.0000 mL | INTRAVENOUS | Status: AC
  Administered 2015-04-09 (×2): 1000 mL via INTRAVENOUS

## 2015-04-09 MED ORDER — HYDROCODONE-ACETAMINOPHEN 5-325 MG PO TABS: 1.0000 | ORAL_TABLET | Freq: Four times a day (QID) | ORAL | Status: DC | PRN

## 2015-04-09 MED ORDER — ALENDRONATE SODIUM 70 MG PO TABS: 70.0000 mg | ORAL_TABLET | ORAL | Status: DC

## 2015-04-09 MED ORDER — SODIUM CHLORIDE 0.9 % IV SOLN
INTRAVENOUS | Status: DC
  Administered 2015-04-09 – 2015-04-10 (×2): via INTRAVENOUS

## 2015-04-09 MED ORDER — ALBUTEROL SULFATE (2.5 MG/3ML) 0.083% IN NEBU
3.0000 mL | INHALATION_SOLUTION | Freq: Four times a day (QID) | RESPIRATORY_TRACT | Status: DC | PRN
  Administered 2015-04-10: 3 mL via RESPIRATORY_TRACT
  Filled 2015-04-09: qty 3

## 2015-04-09 MED ORDER — MIRTAZAPINE 15 MG PO TABS: 30.0000 mg | ORAL_TABLET | Freq: Every day | ORAL | Status: DC

## 2015-04-09 MED ORDER — PAROXETINE HCL 10 MG PO TABS
20.0000 mg | ORAL_TABLET | Freq: Every day | ORAL | Status: DC
  Administered 2015-04-09 – 2015-04-10 (×2): 20 mg via ORAL
  Filled 2015-04-09 (×2): qty 2

## 2015-04-09 MED ORDER — VANCOMYCIN HCL IN DEXTROSE 1-5 GM/200ML-% IV SOLN
1000.0000 mg | Freq: Once | INTRAVENOUS | Status: AC
  Administered 2015-04-09: 1000 mg via INTRAVENOUS
  Filled 2015-04-09: qty 200

## 2015-04-09 NOTE — H&P (Signed)
Anna Prince is an 81 y.o. female.   Chief Complaint: Diarrhea HPI: The patient presents emergency department complaining of diarrhea. Onset was this afternoon and was abrupt. She admits to feeling as if she may "freezing to death". The patient reports that her 36-year-old grandson has had a viral illness that is similar but without the diarrhea. She had a few episodes of nonbloody nonbilious emesis. Diarrhea has been nonbloody and frequent. In the emergency department the patient failed multiple doses of antiemetic and was found to have some decrease in renal function which prompted emergency department staff to call for admission.  Past Medical History  Diagnosis Date  . Barrett's esophagus 2007  . Sigmoid diverticulosis     by colonoscopy  . Aortic aneurysm ([place])   . Macular degeneration   . Anxiety   . Hyperlipidemia   . Hypertension   . Osteoporosis   . Lumbago   . Depression   . Bursitis 2012    left hip, improved with periodic steroid injection [facility] [place])  . Anemia 2012    of acute blood loss, resolved  . GERD (gastroesophageal reflux disease)     with Barretts Esophagus  . COPD (chronic obstructive pulmonary disease) ([place])   . Arthritis     Past Surgical History  Procedure Laterality Date  . Appendectomy    . Back surgery      X2..1975 arachnoid cyst cervical region(blumquist,g[place]);1997 lumbosacral tumor,9 hr surgery  . Abdominal aortic aneurysm repair  July 2010    [facility]    Family History  Problem Relation Age of Onset  . Coronary artery disease Mother   . Coronary artery disease Father 18  . Cancer Neg Hx     no breast,coon or ovarian ca   Social History:  reports that she has been smoking Cigarettes.  She has been smoking about 0.50 packs per day. She has never used smokeless tobacco. She reports that she does not drink alcohol or use illicit drugs.  Allergies:  Allergies  Allergen Reactions  . Morphine Sulfate Other (See Comments)     ANALGESICS-OPIOID Hallucinations     (Not in a hospital admission)  Results for orders placed or performed during the hospital encounter of 04/09/15 (from the past 48 hour(s))  Gastrointestinal Panel by PCR , Stool     Status: None   Collection Time: 04/08/15 12:20 PM  Result Value Ref Range   Campylobacter species NOT DETECTED NOT DETECTED   Plesimonas shigelloides NOT DETECTED NOT DETECTED   Salmonella species NOT DETECTED NOT DETECTED   Yersinia enterocolitica NOT DETECTED NOT DETECTED   Vibrio species NOT DETECTED NOT DETECTED   Vibrio cholerae NOT DETECTED NOT DETECTED   Enteroaggregative E coli (EAEC) NOT DETECTED NOT DETECTED   Enteropathogenic E coli (EPEC) NOT DETECTED NOT DETECTED   Enterotoxigenic E coli (ETEC) NOT DETECTED NOT DETECTED   Shiga like toxin producing E coli (STEC) NOT DETECTED NOT DETECTED   E. coli O157 NOT DETECTED NOT DETECTED   Shigella/Enteroinvasive E coli (EIEC) NOT DETECTED NOT DETECTED   Cryptosporidium NOT DETECTED NOT DETECTED   Cyclospora cayetanensis NOT DETECTED NOT DETECTED   Entamoeba histolytica NOT DETECTED NOT DETECTED   Giardia lamblia NOT DETECTED NOT DETECTED   Adenovirus F40/41 NOT DETECTED NOT DETECTED   Astrovirus NOT DETECTED NOT DETECTED   Norovirus GI/GII NOT DETECTED NOT DETECTED   Rotavirus A NOT DETECTED NOT DETECTED   Sapovirus (I, II, IV, and V) NOT DETECTED NOT DETECTED  C  difficile quick scan w PCR reflex     Status: None   Collection Time: 04/08/15 12:20 PM  Result Value Ref Range   C Diff antigen NEGATIVE NEGATIVE   C Diff toxin NEGATIVE NEGATIVE   C Diff interpretation Negative for C. difficile   Lactic acid, plasma     Status: None   Collection Time: 04/09/15  1:31 AM  Result Value Ref Range   Lactic Acid, Venous 1.5 0.5 - 2.0 mmol/L  Comprehensive metabolic panel     Status: Abnormal   Collection Time: 04/09/15  1:31 AM  Result Value Ref Range   Sodium 139 135 - 145 mmol/L   Potassium 4.2 3.5 - 5.1 mmol/L    Chloride 106 101 - 111 mmol/L   CO2 25 22 - 32 mmol/L   Glucose, Bld 150 (H) 65 - 99 mg/dL   BUN 22 (H) 6 - 20 mg/dL   Creatinine, Ser 1.18 (H) 0.44 - 1.00 mg/dL   Calcium 9.0 8.9 - 10.3 mg/dL   Total Protein 7.1 6.5 - 8.1 g/dL   Albumin 4.2 3.5 - 5.0 g/dL   AST 24 15 - 41 U/L   ALT 15 14 - 54 U/L   Alkaline Phosphatase 76 38 - 126 U/L   Total Bilirubin 1.0 0.3 - 1.2 mg/dL   GFR calc non Af Amer 42 (L) >60 mL/min   GFR calc Af Amer 49 (L) >60 mL/min    Comment: (NOTE) The eGFR has been calculated using the CKD EPI equation. This calculation has not been validated in all clinical situations. eGFR's persistently <60 mL/min signify possible Chronic Kidney Disease.    Anion gap 8 5 - 15  Lipase, blood     Status: None   Collection Time: 04/09/15  1:31 AM  Result Value Ref Range   Lipase 36 11 - 51 U/L  Troponin I     Status: None   Collection Time: 04/09/15  1:31 AM  Result Value Ref Range   Troponin I <0.03 <0.031 ng/mL    Comment:        NO INDICATION OF MYOCARDIAL INJURY.   CBC WITH DIFFERENTIAL     Status: Abnormal   Collection Time: 04/09/15  1:31 AM  Result Value Ref Range   WBC 10.9 3.6 - 11.0 K/uL   RBC 4.07 3.80 - 5.20 MIL/uL   Hemoglobin 12.6 12.0 - 16.0 g/dL   HCT 38.6 35.0 - 47.0 %   MCV 94.9 80.0 - 100.0 fL   MCH 30.8 26.0 - 34.0 pg   MCHC 32.5 32.0 - 36.0 g/dL   RDW 13.9 11.5 - 14.5 %   Platelets 136 (L) 150 - 440 K/uL   Neutrophils Relative % 94 %   Neutro Abs 10.2 (H) 1.4 - 6.5 K/uL   Lymphocytes Relative 3 %   Lymphs Abs 0.3 (L) 1.0 - 3.6 K/uL   Monocytes Relative 3 %   Monocytes Absolute 0.3 0.2 - 0.9 K/uL   Eosinophils Relative 0 %   Eosinophils Absolute 0.0 0 - 0.7 K/uL   Basophils Relative 0 %   Basophils Absolute 0.0 0 - 0.1 K/uL  APTT     Status: Abnormal   Collection Time: 04/09/15  1:31 AM  Result Value Ref Range   aPTT <24 (L) 24 - 36 seconds  Protime-INR     Status: None   Collection Time: 04/09/15  1:31 AM  Result Value Ref Range    Prothrombin Time 12.6 11.4 - 15.0 seconds  INR 0.92   Urinalysis complete, with microscopic ([facility] only)     Status: Abnormal   Collection Time: 04/09/15  1:31 AM  Result Value Ref Range   Color, Urine YELLOW (A) YELLOW   APPearance CLEAR (A) CLEAR   Glucose, UA NEGATIVE NEGATIVE mg/dL   Bilirubin Urine NEGATIVE NEGATIVE   Ketones, ur NEGATIVE NEGATIVE mg/dL   Specific Gravity, Urine 1.012 1.005 - 1.030   Hgb urine dipstick 1+ (A) NEGATIVE   pH 8.0 5.0 - 8.0   Protein, ur NEGATIVE NEGATIVE mg/dL   Nitrite NEGATIVE NEGATIVE   Leukocytes, UA 2+ (A) NEGATIVE   RBC / HPF 0-5 0 - 5 RBC/hpf   WBC, UA 6-30 0 - 5 WBC/hpf   Bacteria, UA NONE SEEN NONE SEEN   Squamous Epithelial / LPF 0-5 (A) NONE SEEN   Dg Chest Port 1 View  04/09/2015  CLINICAL DATA:  Sepsis. Nausea and vomiting. Dizziness and weakness. EXAM: PORTABLE CHEST 1 VIEW COMPARISON:  02/02/2015 FINDINGS: Lungs remain hyperinflated. Improved right basilar aeration with minimal residual atelectasis. Cardiomediastinal contours are unchanged with tortuosity of the thoracic aorta. The heart size normal. Retrocardiac hiatal hernia is again seen. No pulmonary edema, pleural effusion or pneumothorax. No acute osseous abnormalities are seen. IMPRESSION: Improved right basilar aeration with minimal residual atelectasis. No new airspace disease. Electronically Signed   By: Jeb Levering M.D.   On: 04/09/2015 01:54    Review of Systems  Constitutional: Positive for chills. Negative for fever.  HENT: Negative for sore throat and tinnitus.   Eyes: Negative for blurred vision and redness.  Respiratory: Negative for cough and shortness of breath.   Cardiovascular: Negative for chest pain, palpitations, orthopnea and PND.  Gastrointestinal: Positive for nausea, vomiting and diarrhea. Negative for abdominal pain.  Genitourinary: Negative for dysuria, urgency and frequency.  Musculoskeletal: Negative for myalgias and joint pain.  Skin:  Negative for rash.       No lesions  Neurological: Negative for speech change, focal weakness and weakness.  Endo/Heme/Allergies: Does not bruise/bleed easily.       No temperature intolerance  Psychiatric/Behavioral: Negative for depression and suicidal ideas.    Blood pressure 101/59, pulse 97, temperature 99.3 F (37.4 C), temperature source Oral, resp. rate 24, height '5\' 6"'  (1.676 m), weight 61.236 kg (135 lb), SpO2 96 %. Physical Exam  Vitals reviewed. Constitutional: She is oriented to person, place, and time. She appears well-developed and well-nourished. No distress.  HENT:  Head: Normocephalic and atraumatic.  Mouth/Throat: Oropharynx is clear and moist.  Eyes: Conjunctivae and EOM are normal. Pupils are equal, round, and reactive to light. No scleral icterus.  Neck: Normal range of motion. Neck supple. No JVD present. No tracheal deviation present. No thyromegaly present.  Cardiovascular: Normal rate, regular rhythm and normal heart sounds.  Exam reveals no gallop and no friction rub.   No murmur heard. Respiratory: Effort normal and breath sounds normal.  GI: Soft. Bowel sounds are normal. She exhibits no distension. There is no tenderness.  Genitourinary:  Deferred  Musculoskeletal: Normal range of motion. She exhibits no edema.  Lymphadenopathy:    She has no cervical adenopathy.  Neurological: She is alert and oriented to person, place, and time. No cranial nerve deficit. She exhibits normal muscle tone.  Skin: Skin is warm and dry. No rash noted. No erythema.  Psychiatric: She has a normal mood and affect. Her behavior is normal. Judgment and thought content normal.     Assessment/Plan This is an  81 year old Caucasian female admitted for intractable nausea and vomiting as diarrhea. 1. Diarrhea: Appears to be viral. The patient technically meets sepsis criteria at this time via fever (resolved without Tylenol) and tachypnea. She is received broad-spectrum antibiotics  in the emergency department person protocol. I have ordered stool and serum osmoles and place the patient on droplet as well as enteric precautions due to the likely airborne spread of viral gastroenteritis. Rapid enteric PCR is pending. C. difficile is negative. Supportive care. 2. Intractable nausea and vomiting: We'll aggressively hydrate the patient with intravenous fluid. 3. Acute kidney injury: Secondary to dehydration. Avoid nephrotoxic drugs and hydrate as above. 4. Hypertension: Continue lisinopril. (History of aortic aneurysm; no indication of instability). 5. Hyperlipidemia: Continue statin therapy 6. COPD: Stable. Albuterol as needed 7. Anxiety: Continue diazepam as well as proximal seen 8. Depression: Continue Remeron 9. DVT prophylaxis: Heparin 10. GI prophylaxis: None as the patient is not critically ill The patient is a full code. Time spent on admission orders and patient care approximately 45 minutes  Harrie Foreman 04/09/2015, 6:06 AM

## 2015-04-09 NOTE — Progress Notes (Signed)
Fosamax d/c per protocol. Please reorder with discharge medications. 

## 2015-04-09 NOTE — ED Provider Notes (Signed)
[facility] Emergency Department Provider Note  ____________________________________________  Time seen: Approximately 2:00 AM  I have reviewed the triage vital signs and the nursing notes.   HISTORY  Chief Complaint Emesis and Fever    HPI Anna Prince is a 81 y.o. female who presents in moderate to severe distress with uncontrollable diarrhea and persistent vomiting.  She reports that she has not felt well all day with general malaise but no specific symptoms.  About 8 hours ago she lied down (about 6:00 PM) and then shortly thereafter started having vomiting and nearly uncontrollable diarrhea.  After several hours of this she came to the emergency department and had multiple episodes of diarrheal incontinence even while being triaged.  She was brought back immediately to the exam room.  She denies any abdominal pain as well as chest pain, shortness of breath, fever/chills, and dysuria.  Her vomiting is better after Zofran.Nothing makes the symptoms better and nothing makes them worse.  The overall severity is severe.  She has not recently been on antibiotics.  She said she has had a similar episode years ago.  She has been around her 71-year-old grandson recently who is apparently also had some vomiting and diarrhea.   Past Medical History  Diagnosis Date  . Barrett's esophagus 2007  . Sigmoid diverticulosis     by colonoscopy  . Aortic aneurysm ([place])   . Macular degeneration   . Anxiety   . Hyperlipidemia   . Hypertension   . Osteoporosis   . Lumbago   . Depression   . Bursitis 2012    left hip, improved with periodic steroid injection [facility] [place])  . Anemia 2012    of acute blood loss, resolved  . GERD (gastroesophageal reflux disease)     with Barretts Esophagus  . COPD (chronic obstructive pulmonary disease) ([place])   . Arthritis     Patient Active Problem List   Diagnosis Date Noted  . Intractable nausea and vomiting 04/09/2015  .  Generalized weakness 02/12/2015  . Candida esophagitis ([place]) 02/10/2015  . CAP (community acquired pneumonia) 02/03/2015  . AKI (acute kidney injury) ([place]) 02/03/2015  . Underweight 11/27/2014  . Hyperkalemia 11/27/2014  . Chronic left hip pain 11/27/2014  . Anemia 11/25/2014  . Elevated liver enzymes 08/25/2014  . Pubic ramus fracture ([place]) 08/25/2014  . Vascular dementia of acute onset without behavioral disturbance 02/20/2014  . Chronic pain syndrome 05/27/2013  . Tobacco abuse counseling 04/10/2013  . Skin lesion of face 02/22/2012  . GERD (gastroesophageal reflux disease)   . Screening for colon cancer 06/18/2011  . COPD (chronic obstructive pulmonary disease) ([place]) 06/18/2011  . Bursitis   . Skin cancer of nose 12/17/2010  . Vitamin D deficiency 12/17/2010  . Hyperlipidemia LDL goal <100 12/17/2010  . Screening for malignant neoplasm of breast 12/17/2010  . Encounter for long-term (current) use of other medications 12/17/2010  . Aortic aneurysm, abdominal ([place]) 12/17/2010  . Diverticulosis, sigmoid 12/08/2010  . Barrett's esophagus 12/08/2010  . Hyperlipidemia 12/08/2010  . Hypertension 12/08/2010  . Lumbago 12/08/2010  . Osteoporosis 12/08/2010  . Depression 12/08/2010    Past Surgical History  Procedure Laterality Date  . Appendectomy    . Back surgery      X2..1975 arachnoid cyst cervical region(blumquist,gso);1997 lumbosacral tumor,9 hr surgery  . Abdominal aortic aneurysm repair  July 2010    [facility]    No current outpatient prescriptions on file.  Allergies Morphine sulfate  Family History  Problem Relation Age of Onset  . Coronary artery disease Mother   . Coronary artery disease Father 50  . Cancer Neg Hx     no breast,coon or ovarian ca    Social History Social History  Substance Use Topics  . Smoking status: Current Every Day Smoker -- 0.50 packs/day    Types: Cigarettes  . Smokeless tobacco: Never Used     Comment: HAS BEEN SMOKING 30+  YEARS,RESUMED TOBACCO USE AFTER AAA REPAIR  . Alcohol Use: No    Review of Systems Constitutional: No fever/chills Eyes: No visual changes. ENT: No sore throat. Cardiovascular: Denies chest pain. Respiratory: Denies shortness of breath. Gastrointestinal: No abdominal pain.  Persistent vomiting and uncontrollable diarrhea. Genitourinary: Negative for dysuria. Musculoskeletal: Negative for back pain. Skin: Negative for rash. Neurological: Negative for headaches, focal weakness or numbness.  10-point ROS otherwise negative.  ____________________________________________   PHYSICAL EXAM:  VITAL SIGNS: ED Triage Vitals  Enc Vitals Group     BP 04/09/15 0032 111/73 mmHg     Pulse Rate 04/09/15 0032 116     Resp 04/09/15 0032 18     Temp 04/09/15 0032 101.3 F (38.5 C)     Temp Source 04/09/15 0032 Oral     SpO2 04/09/15 0032 92 %     Weight 04/09/15 0032 132 lb (59.875 kg)     Height 04/09/15 0032 5\' 6"  (1.676 m)     Head Cir --      Peak Flow --      Pain Score --      Pain Loc --      Pain Edu? --      Excl. in [place]? --     Constitutional: Alert and oriented.  Ill appearing but nontoxic. Eyes: Conjunctivae are normal. PERRL. EOMI. Head: Atraumatic. Nose: No congestion/rhinnorhea. Mouth/Throat: Mucous membranes are dry.  Oropharynx non-erythematous. Neck: No stridor.   Cardiovascular: Tachycardia, regular rhythm. Grossly normal heart sounds.  Good peripheral circulation. Respiratory: Normal respiratory effort.  No retractions. Lungs CTAB. Gastrointestinal: Soft and nontender. No distention. No abdominal bruits. No CVA tenderness. Musculoskeletal: No lower extremity tenderness nor edema.  No joint effusions. Neurologic:  Normal speech and language. No gross focal neurologic deficits are appreciated.  Skin:  Skin is warm, dry and intact. No rash noted. Psychiatric: Mood and affect are normal. Speech and behavior are  normal.  ____________________________________________   LABS (all labs ordered are listed, but only abnormal results are displayed)  Labs Reviewed  COMPREHENSIVE METABOLIC PANEL - Abnormal; Notable for the following:    Glucose, Bld 150 (*)    BUN 22 (*)    Creatinine, Ser 1.18 (*)    GFR calc non Af Amer 42 (*)    GFR calc Af Amer 49 (*)    All other components within normal limits  CBC WITH DIFFERENTIAL/PLATELET - Abnormal; Notable for the following:    Platelets 136 (*)    Neutro Abs 10.2 (*)    Lymphs Abs 0.3 (*)    All other components within normal limits  APTT - Abnormal; Notable for the following:    aPTT <24 (*)    All other components within normal limits  URINALYSIS COMPLETEWITH MICROSCOPIC ([facility] ONLY) - Abnormal; Notable for the following:    Color, Urine YELLOW (*)    APPearance CLEAR (*)    Hgb urine dipstick 1+ (*)    Leukocytes, UA 2+ (*)    Squamous Epithelial / LPF 0-5 (*)    All  other components within normal limits  GASTROINTESTINAL PANEL BY PCR, STOOL (REPLACES STOOL CULTURE)  C DIFFICILE QUICK SCREEN W PCR REFLEX  CULTURE, BLOOD (ROUTINE X 2)  CULTURE, BLOOD (ROUTINE X 2)  URINE CULTURE  LACTIC ACID, PLASMA  LIPASE, BLOOD  TROPONIN I  PROTIME-INR  TSH  OSMOLALITY, STOOL  OSMOLALITY   ____________________________________________  EKG  ED ECG REPORT I, Brielyn Bosak, the attending physician, personally viewed and interpreted this ECG.  Date: 04/09/2015 EKG Time: 01:24 Rate: 103 Rhythm: Sinus tachycardia QRS Axis: normal Intervals: normal ST/T Wave abnormalities: normal Conduction Disutrbances: none Narrative Interpretation: unremarkable  ____________________________________________  RADIOLOGY   Dg Chest Port 1 View  04/09/2015  CLINICAL DATA:  Sepsis. Nausea and vomiting. Dizziness and weakness. EXAM: PORTABLE CHEST 1 VIEW COMPARISON:  02/02/2015 FINDINGS: Lungs remain hyperinflated. Improved right basilar aeration with minimal  residual atelectasis. Cardiomediastinal contours are unchanged with tortuosity of the thoracic aorta. The heart size normal. Retrocardiac hiatal hernia is again seen. No pulmonary edema, pleural effusion or pneumothorax. No acute osseous abnormalities are seen. IMPRESSION: Improved right basilar aeration with minimal residual atelectasis. No new airspace disease. Electronically Signed   By: Jeb Levering M.D.   On: 04/09/2015 01:54    ____________________________________________   PROCEDURES  Procedure(s) performed: None  Critical Care performed: Yes, see critical care note(s)   CRITICAL CARE Performed by: Hinda Kehr  Total critical care time: 30 minutes  Critical care time was exclusive of separately billable procedures and treating other patients.  Critical care was necessary to treat or prevent imminent or life-threatening deterioration.  Critical care was time spent personally by me on the following activities: development of treatment plan with patient and/or surrogate as well as nursing, discussions with consultants, evaluation of patient's response to treatment, examination of patient, obtaining history from patient or surrogate, ordering and performing treatments and interventions, ordering and review of laboratory studies, ordering and review of radiographic studies, pulse oximetry and re-evaluation of patient's condition.     ____________________________________________   INITIAL IMPRESSION / ASSESSMENT AND PLAN / ED COURSE  Pertinent labs & imaging results that were available during my care of the patient were reviewed by me and considered in my medical decision making (see chart for details).  The patient appeared quite ill initially upon arrival and her vital signs including fever and tachycardia qualify her for sepsis protocol which I ordered including weight-based fluids and empiric antibiotics.  However, the smell of the room and the nature of her symptoms  suggest a viral gastroenteritis.  I will give her a course of Zosyn and vancomycin as per protocol but I suspect that the answer may be in the GI tract pathogen panel which I have sent as well as a C. difficile panel.  The patient is actually nontoxic appearing and is getting fluids as well as antibiotics at this time.  I will reassess after the GI pathogen panel has come back as well as of course her urinalysis and blood work.  (Note that documentation was delayed due to multiple ED patients requiring immediate care.)  No specific source identified, but patient continues to appear ill and has had a relatively low blood pressure at one point getting below [id number] systolic.  Her respiratory rate continues to be in the 30s and she continues to have low-grade fevers after the initial documented fever of 101.3.  Though I do not have a specific source she clearly meets SIRS/sepsis right ear area.  She has gotten 30 mL/kg of IV  fluids.  Her GI pathogen panel pending.  C. difficile negative.  I will admit for further management.   ____________________________________________  FINAL CLINICAL IMPRESSION(S) / ED DIAGNOSES  Final diagnoses:  Sepsis, due to unspecified organism ([place])  Nausea and vomiting, vomiting of unspecified type  Intractable diarrhea      NEW MEDICATIONS STARTED DURING THIS VISIT:  Current Discharge Medication List       Hinda Kehr, MD 04/09/15 0730

## 2015-04-09 NOTE — Plan of Care (Signed)
Problem: Education: Goal: Knowledge of [facility] Education information/materials will improve Outcome: Completed/Met Date Met:  04/09/15 Pt alert and understands basic care  Problem: Safety: Goal: Ability to remain free from injury will improve Outcome: Progressing No injury noted. High fall risk. Precautions in place  Problem: Pain Managment: Goal: General experience of comfort will improve Outcome: Completed/Met Date Met:  04/09/15 No further pain, n/v/d. Understands to call for needs  Problem: Physical Regulation: Goal: Will remain free from infection Outcome: Progressing No s/s of infection  Problem: Skin Integrity: Goal: Risk for impaired skin integrity will decrease Outcome: Progressing Skin integrity in tact  Problem: Activity: Goal: Risk for activity intolerance will decrease Outcome: Progressing More energy this afternoon.  Problem: Fluid Volume: Goal: Ability to maintain a balanced intake and output will improve Outcome: Progressing Tolerating clear liquids and crackers without n/v/d

## 2015-04-09 NOTE — ED Notes (Signed)
Pt arrived to ED reporting NVD beginning at 1800 tonight. Pt reports vomiting 6 times since then and feeling "hot." Pt also reports feeling dizzy and weak at this time. Lat reported time vomiting was in the car on the way to hospital.

## 2015-04-09 NOTE — Care Management Obs Status (Signed)
[place] NOTIFICATION   Patient Details  Name: Anna Prince MRN: [id number] Date of Birth: 12/30/1931   Medicare Observation Status Notification Given:  Yes    Ival Bible, RN 04/09/2015, 6:29 AM

## 2015-04-09 NOTE — Progress Notes (Signed)
C/o indigestion. Notified Dr Posey Pronto. Orders received

## 2015-04-09 NOTE — Progress Notes (Addendum)
ANTIBIOTIC CONSULT NOTE - INITIAL  Pharmacy Consult for vancomycin and Zosyn dosing Indication: sepsis  Allergies  Allergen Reactions  . Morphine Sulfate Other (See Comments)    ANALGESICS-OPIOID Hallucinations    Patient Measurements: Height: 5\' 6"  (167.6 cm) Weight: 135 lb (61.236 kg) IBW/kg (Calculated) : 59.3 Adjusted Body Weight: 59.9kg  Vital Signs: Temp: 99.3 F (37.4 C) (12/17 0454) Temp Source: Oral (12/17 0454) BP: 97/60 mmHg (12/17 0454) Pulse Rate: 96 (12/17 0454) Intake/Output from previous day:   Intake/Output from this shift:    Labs:  Recent Labs  04/09/15 0131  WBC 10.9  HGB 12.6  PLT 136*  CREATININE 1.18*   Estimated Creatinine Clearance: 35 mL/min (by C-G formula based on Cr of 1.18). No results for input(s): VANCOTROUGH, VANCOPEAK, VANCORANDOM, GENTTROUGH, GENTPEAK, GENTRANDOM, TOBRATROUGH, TOBRAPEAK, TOBRARND, AMIKACINPEAK, AMIKACINTROU, AMIKACIN in the last 72 hours.   Microbiology: Recent Results (from the past 720 hour(s))  Gastrointestinal Panel by PCR , Stool     Status: None   Collection Time: 04/08/15 12:20 PM  Result Value Ref Range Status   Campylobacter species NOT DETECTED NOT DETECTED Final   Plesimonas shigelloides NOT DETECTED NOT DETECTED Final   Salmonella species NOT DETECTED NOT DETECTED Final   Yersinia enterocolitica NOT DETECTED NOT DETECTED Final   Vibrio species NOT DETECTED NOT DETECTED Final   Vibrio cholerae NOT DETECTED NOT DETECTED Final   Enteroaggregative E coli (EAEC) NOT DETECTED NOT DETECTED Final   Enteropathogenic E coli (EPEC) NOT DETECTED NOT DETECTED Final   Enterotoxigenic E coli (ETEC) NOT DETECTED NOT DETECTED Final   Shiga like toxin producing E coli (STEC) NOT DETECTED NOT DETECTED Final   E. coli O157 NOT DETECTED NOT DETECTED Final   Shigella/Enteroinvasive E coli (EIEC) NOT DETECTED NOT DETECTED Final   Cryptosporidium NOT DETECTED NOT DETECTED Final   Cyclospora cayetanensis NOT DETECTED  NOT DETECTED Final   Entamoeba histolytica NOT DETECTED NOT DETECTED Final   Giardia lamblia NOT DETECTED NOT DETECTED Final   Adenovirus F40/41 NOT DETECTED NOT DETECTED Final   Astrovirus NOT DETECTED NOT DETECTED Final   Norovirus GI/GII NOT DETECTED NOT DETECTED Final   Rotavirus A NOT DETECTED NOT DETECTED Final   Sapovirus (I, II, IV, and V) NOT DETECTED NOT DETECTED Final  C difficile quick scan w PCR reflex     Status: None   Collection Time: 04/08/15 12:20 PM  Result Value Ref Range Status   C Diff antigen NEGATIVE NEGATIVE Final   C Diff toxin NEGATIVE NEGATIVE Final   C Diff interpretation Negative for C. difficile  Final    Medical History: Past Medical History  Diagnosis Date  . Barrett's esophagus 2007  . Sigmoid diverticulosis     by colonoscopy  . Aortic aneurysm ([place])   . Macular degeneration   . Anxiety   . Hyperlipidemia   . Hypertension   . Osteoporosis   . Lumbago   . Depression   . Bursitis 2012    left hip, improved with periodic steroid injection [facility])  . Anemia 2012    of acute blood loss, resolved  . GERD (gastroesophageal reflux disease)     with Barretts Esophagus  . COPD (chronic obstructive pulmonary disease) ([place])   . Arthritis     Medications:   Assessment: Blood and urine cx pending UA: LE(+) NO2(-) WBC 6-30 CXR: no new disease  Goal of Therapy:  Vancomycin trough level 15-20 mcg/ml  Plan:  TBW 59.9kg  IBW 59.3kg  DW 59.9kg  Vd 42L kei 0.033 hr-1  T1/2 21 hours Vancomycin 750 mg q 24 hours ordered.  Level before 5th dose.  Zosyn 4.5 grams ordered for Pseudomonas risk of recent abx exposure, azithromycin as outpatient.  Adhira Jamil S 04/09/2015,5:45 AM

## 2015-04-09 NOTE — ED Notes (Signed)
Daughter wants to be contacted if pt is going to be admitted and/or with any changes, or news of possible discharge.  The number is [phone].  Daughter - Tonia Brooms - Daughter-in-law.

## 2015-04-09 NOTE — Progress Notes (Signed)
[name] at [place] NAME: Anna Prince    MR#:  [id number]  DATE OF BIRTH:  1931/12/16  SUBJECTIVE:  Came in with N/V diarrhea. No resolveing. Mild URI symptoms Sick great grandchild at home  REVIEW OF SYSTEMS:   Review of Systems  Constitutional: Negative for fever, chills and weight loss.  HENT: Positive for congestion. Negative for ear discharge, ear pain and nosebleeds.   Eyes: Negative for blurred vision, pain and discharge.  Respiratory: Positive for cough. Negative for sputum production, shortness of breath, wheezing and stridor.   Cardiovascular: Negative for chest pain, palpitations, orthopnea and PND.  Gastrointestinal: Positive for nausea, vomiting and diarrhea. Negative for abdominal pain.  Genitourinary: Negative for urgency and frequency.  Musculoskeletal: Negative for back pain and joint pain.  Neurological: Positive for weakness. Negative for sensory change, speech change and focal weakness.  Psychiatric/Behavioral: Negative for depression and hallucinations. The patient is not nervous/anxious.   :  Tolerating PT:not neded Tolerating diet: yes  DRUG ALLERGIES:   Allergies  Allergen Reactions  . Morphine Sulfate Other (See Comments)    ANALGESICS-OPIOID Hallucinations    VITALS:  Blood pressure 110/42, pulse 83, temperature 101 F (38.3 C), temperature source Oral, resp. rate 19, height 5\' 6"  (1.676 m), weight 140 lb (63.504 kg), SpO2 95 %.  PHYSICAL EXAMINATION:  GENERAL:  81 y.o.-year-old patient lying in the bed with no acute distress. Thin, lean EYES: Pupils equal, round, reactive to light and accommodation. No scleral icterus. Extraocular muscles intact.  HEENT: Head atraumatic, normocephalic. Oropharynx and nasopharynx clear.  NECK:  Supple, no jugular venous distention. No thyroid enlargement, no tenderness.  LUNGS: Normal breath sounds bilaterally, no wheezing, rales,rhonchi or crepitation. No  use of accessory muscles of respiration.  CARDIOVASCULAR: S1, S2 normal. No murmurs, rubs, or gallops.  ABDOMEN: Soft, nontender, nondistended. Bowel sounds present. No organomegaly or mass.  EXTREMITIES: No pedal edema, cyanosis, or clubbing.  NEUROLOGIC: Cranial nerves II through XII are intact. Muscle strength 5/5 in all extremities. Sensation intact. Gait not checked.  PSYCHIATRIC: The patient is alert and oriented x 3.  SKIN: No obvious rash, lesion, or ulcer.    LABORATORY PANEL:   CBC  Recent Labs Lab 04/09/15 0131  WBC 10.9  HGB 12.6  HCT 38.6  PLT 136*   ------------------------------------------------------------------------------------------------------------------  Chemistries   Recent Labs Lab 04/09/15 0131  NA 139  K 4.2  CL 106  CO2 25  GLUCOSE 150*  BUN 22*  CREATININE 1.18*  CALCIUM 9.0  AST 24  ALT 15  ALKPHOS 76  BILITOT 1.0   ------------------------------------------------------------------------------------------------------------------  Cardiac Enzymes  Recent Labs Lab 04/09/15 0131  TROPONINI <0.03   ------------------------------------------------------------------------------------------------------------------  RADIOLOGY:  Dg Chest Port 1 View  04/09/2015  CLINICAL DATA:  Sepsis. Nausea and vomiting. Dizziness and weakness. EXAM: PORTABLE CHEST 1 VIEW COMPARISON:  02/02/2015 FINDINGS: Lungs remain hyperinflated. Improved right basilar aeration with minimal residual atelectasis. Cardiomediastinal contours are unchanged with tortuosity of the thoracic aorta. The heart size normal. Retrocardiac hiatal hernia is again seen. No pulmonary edema, pleural effusion or pneumothorax. No acute osseous abnormalities are seen. IMPRESSION: Improved right basilar aeration with minimal residual atelectasis. No new airspace disease. Electronically Signed   By: Anna Prince M.D.   On: 04/09/2015 01:54     ASSESSMENT AND PLAN:   81 year old  Caucasian female admitted for intractable nausea and vomiting as diarrhea. 1. Diarrhea: Appears to be viral. The patient technically meets sepsis criteria  at this time via fever (resolved without Tylenol) and tachypnea. She is received broad-spectrum antibiotics in the emergency department person protocol.-cont supportive care -has sick contact at home with viral illness -wbc normal -no other source of infection -hold off IV abxs for now  2. Intractable nausea and vomiting: We'll aggressively hydrate the patient with intravenous fluid.  3. Acute kidney injury: Secondary to dehydration. Avoid nephrotoxic drugs and hydrate as above.  4. Hypertension: Continue lisinopril. (History of aortic aneurysm; no indication of instability).  5. Hyperlipidemia: Continue statin therapy  6. COPD: Stable. Albuterol as needed  7. Anxiety: Continue diazepam as well as proximal seen  8. Depression: Continue Remeron  9. DVT prophylaxis: Heparin     All the records are reviewed and case discussed with Care Management/Social Workerr. Management plans discussed with the patient, family and they are in agreement.  CODE STATUS: full  TOTAL TIME TAKING CARE OF THIS PATIENT: 35 minutes.   POSSIBLE D/C IN 1 DAYS, DEPENDING ON CLINICAL CONDITION.   Anna Prince M.D on 04/09/2015 at 1:40 PM  Between 7am to 6pm - Pager - [phone]  After 6pm go to [email or web address] - password [email or web address]  Office  [phone]  CC: Primary care physician; Anna Mc, MD

## 2015-04-10 LAB — CREATININE, SERUM
Creatinine, Ser: 0.93 mg/dL (ref 0.44–1.00)
GFR calc Af Amer: >60 mL/min (ref >60)
GFR, EST NON AFRICAN AMERICAN: 56 mL/min — AB (ref >60)

## 2015-04-10 NOTE — Discharge Instructions (Signed)
Notify your doctor if your symptoms return and/or worsen.  Drink plenty of fluids.  Take all medications as prescribed.  Notify your doctor for any questions or concerns.

## 2015-04-10 NOTE — Progress Notes (Signed)
Discharge orders received. Reviewed the instructions from the patient who verbalized understanding. A copy of the instructions was provided to the patient. No acute distress noted at the time of discharge. Discharged home with family.

## 2015-04-10 NOTE — Discharge Summary (Signed)
[place] at [place] NAME: Anna Prince    MR#:  [id number]  DATE OF BIRTH:  01/21/1932  DATE OF ADMISSION:  04/09/2015 ADMITTING PHYSICIAN: Harrie Foreman, MD  DATE OF DISCHARGE: 04/10/15  PRIMARY CARE PHYSICIAN: Crecencio Mc, MD    ADMISSION DIAGNOSIS:  Intractable diarrhea [R19.7] Sepsis, due to unspecified organism ([place]) [A41.9] Nausea and vomiting, vomiting of unspecified type [R11.2]  DISCHARGE DIAGNOSIS:  Viral gastroenteritis-resolved Dehydration-resolved  SECONDARY DIAGNOSIS:   Past Medical History  Diagnosis Date  . Barrett's esophagus 2007  . Sigmoid diverticulosis     by colonoscopy  . Aortic aneurysm ([place])   . Macular degeneration   . Anxiety   . Hyperlipidemia   . Hypertension   . Osteoporosis   . Lumbago   . Depression   . Bursitis 2012    left hip, improved with periodic steroid injection [facility] [place] [facility])  . Anemia 2012    of acute blood loss, resolved  . GERD (gastroesophageal reflux disease)     with Barretts Esophagus  . COPD (chronic obstructive pulmonary disease) ([place])   . Arthritis     HOSPITAL COURSE:   81 year old Caucasian female admitted for intractable nausea and vomiting as diarrhea. 1. Diarrhea: Appears to be viral -patient technically meets sepsis criteria at this time via fever (resolved without Tylenol) and tachypnea. She is received broad-spectrum antibiotics in the emergency department person protocol. -cont supportive care -has sick contact at home with viral illness -wbc normal -no other source of infection -hold off IV abxs for now -resolved. Back to baseline  2. Intractable nausea and vomiting:  -received IVF  3. Acute kidney injury: Secondary to dehydration. Avoid nephrotoxic drugs and hydrate as above. -resolved Creat 0.9  4. Hypertension: Continue lisinopril. (History of aortic aneurysm; no indication of instability).  5. Hyperlipidemia: Continue  statin therapy  6. COPD: Stable. Albuterol as needed  7. Anxiety: Continue diazepam as well as proximal seen  8. Depression: Continue Remeron  9. DVT prophylaxis: Heparin  Overall improved. D/c home CONSULTS OBTAINED:   none  DRUG ALLERGIES:   Allergies  Allergen Reactions  . Morphine Sulfate Other (See Comments)    ANALGESICS-OPIOID Hallucinations    DISCHARGE MEDICATIONS:   Current Discharge Medication List    CONTINUE these medications which have NOT CHANGED   Details  albuterol (PROVENTIL HFA;VENTOLIN HFA) 108 (90 BASE) MCG/ACT inhaler Inhale 2 puffs into the lungs every 6 (six) hours as needed for wheezing. Qty: 8.5 g, Refills: 11    cyanocobalamin 2000 MCG tablet Take 2,000 mcg by mouth daily.    diazepam (VALIUM) 5 MG tablet TAKE 1 TABLET BY MOUTH TWICE DAILY AS NEEDED Qty: 60 tablet, Refills: 1    HYDROcodone-acetaminophen (NORCO/VICODIN) 5-325 MG tablet TAKE 1 TABLET BY MOUTH EVERY 6 HOURS as needed for back and hip pain Qty: 60 tablet, Refills: 0   Associated Diagnoses: Chronic pain syndrome    lisinopril (PRINIVIL,ZESTRIL) 20 MG tablet TAKE 1 TABLET BY MOUTH EVERY DAY Qty: 90 tablet, Refills: 1    lovastatin (MEVACOR) 40 MG tablet TAKE 1 TABLET BY MOUTH EVERY DAY Qty: 90 tablet, Refills: 0    meloxicam (MOBIC) 15 MG tablet Take 1 tablet (15 mg total) by mouth daily. Qty: 30 tablet, Refills: 5    mirtazapine (REMERON) 30 MG tablet TAKE 1 TABLET(30 MG) BY MOUTH AT BEDTIME Qty: 30 tablet, Refills: 0    omeprazole (PRILOSEC) 40 MG capsule Take 1 capsule (  40 mg total) by mouth daily. Qty: 90 capsule, Refills: 3    !! PARoxetine (PAXIL) 20 MG tablet TAKE 1 TABLET BY MOUTH EVERY DAY Qty: 30 tablet, Refills: 0    !! PARoxetine (PAXIL) 20 MG tablet TAKE 1 TABLET(20 MG) BY MOUTH DAILY Qty: 30 tablet, Refills: 0     !! - Potential duplicate medications found. Please discuss with provider.    STOP taking these medications     nystatin (MYCOSTATIN)  100000 UNIT/ML suspension         If you experience worsening of your admission symptoms, develop shortness of breath, life threatening emergency, suicidal or homicidal thoughts you must seek medical attention immediately by calling 911 or calling your MD immediately  if symptoms less severe.  You Must read complete instructions/literature along with all the possible adverse reactions/side effects for all the Medicines you take and that have been prescribed to you. Take any new Medicines after you have completely understood and accept all the possible adverse reactions/side effects.   Please note  You were cared for by a hospitalist during your hospital stay. If you have any questions about your discharge medications or the care you received while you were in the hospital after you are discharged, you can call the unit and asked to speak with the hospitalist on call if the hospitalist that took care of you is not available. Once you are discharged, your primary care physician will handle any further medical issues. Please note that NO REFILLS for any discharge medications will be authorized once you are discharged, as it is imperative that you return to your primary care physician (or establish a relationship with a primary care physician if you do not have one) for your aftercare needs so that they can reassess your need for medications and monitor your lab values. Today   SUBJECTIVE  Doing well. No N/V/D   VITAL SIGNS:  Blood pressure 135/63, pulse 83, temperature 98.6 F (37 C), temperature source Oral, resp. rate 19, height 5\' 6"  (1.676 m), weight 65.182 kg (143 lb 11.2 oz), SpO2 99 %.  I/O:   Intake/Output Summary (Last 24 hours) at 04/10/15 0848 Last data filed at 04/10/15 [id number]  Gross per 24 hour  Intake   1305 ml  Output      0 ml  Net   1305 ml    PHYSICAL EXAMINATION:  GENERAL:  81 y.o.-year-old patient lying in the bed with no acute distress.  EYES: Pupils equal, round,  reactive to light and accommodation. No scleral icterus. Extraocular muscles intact.  HEENT: Head atraumatic, normocephalic. Oropharynx and nasopharynx clear.  NECK:  Supple, no jugular venous distention. No thyroid enlargement, no tenderness.  LUNGS: Normal breath sounds bilaterally, no wheezing, rales,rhonchi or crepitation. No use of accessory muscles of respiration.  CARDIOVASCULAR: S1, S2 normal. No murmurs, rubs, or gallops.  ABDOMEN: Soft, non-tender, non-distended. Bowel sounds present. No organomegaly or mass.  EXTREMITIES: No pedal edema, cyanosis, or clubbing.  NEUROLOGIC: Cranial nerves II through XII are intact. Muscle strength 5/5 in all extremities. Sensation intact. Gait not checked.  PSYCHIATRIC: The patient is alert and oriented x 3.  SKIN: No obvious rash, lesion, or ulcer.   DATA REVIEW:   CBC   Recent Labs Lab 04/09/15 0131  WBC 10.9  HGB 12.6  HCT 38.6  PLT 136*    Chemistries   Recent Labs Lab 04/09/15 0131 04/10/15 0619  NA 139  --   K 4.2  --   CL  106  --   CO2 25  --   GLUCOSE 150*  --   BUN 22*  --   CREATININE 1.18* 0.93  CALCIUM 9.0  --   AST 24  --   ALT 15  --   ALKPHOS 76  --   BILITOT 1.0  --     Microbiology Results   Recent Results (from the past 240 hour(s))  Gastrointestinal Panel by PCR , Stool     Status: None   Collection Time: 04/08/15 12:20 PM  Result Value Ref Range Status   Campylobacter species NOT DETECTED NOT DETECTED Final   Plesimonas shigelloides NOT DETECTED NOT DETECTED Final   Salmonella species NOT DETECTED NOT DETECTED Final   Yersinia enterocolitica NOT DETECTED NOT DETECTED Final   Vibrio species NOT DETECTED NOT DETECTED Final   Vibrio cholerae NOT DETECTED NOT DETECTED Final   Enteroaggregative E coli (EAEC) NOT DETECTED NOT DETECTED Final   Enteropathogenic E coli (EPEC) NOT DETECTED NOT DETECTED Final   Enterotoxigenic E coli (ETEC) NOT DETECTED NOT DETECTED Final   Shiga like toxin producing E  coli (STEC) NOT DETECTED NOT DETECTED Final   E. coli O157 NOT DETECTED NOT DETECTED Final   Shigella/Enteroinvasive E coli (EIEC) NOT DETECTED NOT DETECTED Final   Cryptosporidium NOT DETECTED NOT DETECTED Final   Cyclospora cayetanensis NOT DETECTED NOT DETECTED Final   Entamoeba histolytica NOT DETECTED NOT DETECTED Final   Giardia lamblia NOT DETECTED NOT DETECTED Final   Adenovirus F40/41 NOT DETECTED NOT DETECTED Final   Astrovirus NOT DETECTED NOT DETECTED Final   Norovirus GI/GII NOT DETECTED NOT DETECTED Final   Rotavirus A NOT DETECTED NOT DETECTED Final   Sapovirus (I, II, IV, and V) NOT DETECTED NOT DETECTED Final  C difficile quick scan w PCR reflex     Status: None   Collection Time: 04/08/15 12:20 PM  Result Value Ref Range Status   C Diff antigen NEGATIVE NEGATIVE Final   C Diff toxin NEGATIVE NEGATIVE Final   C Diff interpretation Negative for C. difficile  Final  Blood Culture (routine x 2)     Status: None (Preliminary result)   Collection Time: 04/09/15  1:31 AM  Result Value Ref Range Status   Specimen Description BLOOD RIGHT ASSIST CONTROL  Final   Special Requests BOTTLES DRAWN AEROBIC AND ANAEROBIC 4CC  Final   Culture NO GROWTH 1 DAY  Final   Report Status PENDING  Incomplete  Blood Culture (routine x 2)     Status: None (Preliminary result)   Collection Time: 04/09/15  1:46 AM  Result Value Ref Range Status   Specimen Description BLOOD LEFT ASSIST CONTROL  Final   Special Requests BOTTLES DRAWN AEROBIC AND ANAEROBIC 4CC  Final   Culture NO GROWTH 1 DAY  Final   Report Status PENDING  Incomplete    RADIOLOGY:  Dg Chest Port 1 View  04/09/2015  CLINICAL DATA:  Sepsis. Nausea and vomiting. Dizziness and weakness. EXAM: PORTABLE CHEST 1 VIEW COMPARISON:  02/02/2015 FINDINGS: Lungs remain hyperinflated. Improved right basilar aeration with minimal residual atelectasis. Cardiomediastinal contours are unchanged with tortuosity of the thoracic aorta. The heart  size normal. Retrocardiac hiatal hernia is again seen. No pulmonary edema, pleural effusion or pneumothorax. No acute osseous abnormalities are seen. IMPRESSION: Improved right basilar aeration with minimal residual atelectasis. No new airspace disease. Electronically Signed   By: Jeb Levering M.D.   On: 04/09/2015 01:54     Management plans discussed with  the patient, family and they are in agreement.  CODE STATUS:     Code Status Orders        Start     Ordered   04/09/15 [phone]  Full code   Continuous     04/09/15 0642    Advance Directive Documentation        Most Recent Value   Type of Advance Directive  Healthcare Power of Attorney   Pre-existing out of facility DNR order (yellow form or pink MOST form)     "MOST" Form in Place?        TOTAL TIME TAKING CARE OF THIS PATIENT: 40 minutes.    Dealie Koelzer M.D on 04/10/2015 at 8:48 AM  Between 7am to 6pm - Pager - [phone] After 6pm go to [email or web address] - password [email or web address] [facility]  Office  [phone]  CC: Primary care physician; Crecencio Mc, MD

## 2015-04-11 ENCOUNTER — Telehealth: Payer: Self-pay

## 2015-04-11 LAB — URINE CULTURE: Culture: NO GROWTH

## 2015-04-11 NOTE — Telephone Encounter (Signed)
Transitional care management call made.  Unable to reach patient.  Will continue to follow patient.

## 2015-04-12 ENCOUNTER — Telehealth: Payer: Self-pay

## 2015-04-12 NOTE — Telephone Encounter (Signed)
Transition Care Management Follow-up Telephone Call   Date discharged? 04/10/15   How have you been since you were released from the hospital? Headache, SOB, disoriented at times. Decreased appetite, but able to eat and drink. Denies N/V/D, able to use the restroom ok.   Do you understand why you were in the hospital? Yes, viral infection and dehydration.   Do you understand the discharge instructions?  Yes, and daughter [name]) is at home to ensure all is ok.   Where were you discharged to? Home.   Items Reviewed:  Medications reviewed: Yes, continuing scheduled medications with no problems.  Allergies reviewed: Yes, no changes.  Dietary changes reviewed:Yes, no changes.       Referrals reviewed: Yes, follow up appointment.  Functional Questionnaire:   Activities of Daily Living (ADLs):   She states they are independent in the following: Toileting, self feeding, grooming. States they require assistance with the following: Ambulating (uses cane), dressing, bathing.  Daughter helps assist as needed.   Any transportation issues/concerns?: No   Any patient concerns? Dementia seems to be worsening as per daughter [name]).   Confirmed importance and date/time of follow-up visits scheduled Yes, appointment scheduled 04/13/15 at 11:30.  Provider Appointment booked with Dr. Derrel Nip (PCP).  Confirmed with patient if condition begins to worsen call PCP or go to the ER.  Patient was given the office number and encouraged to call back with question or concerns.  : Yes, daughter verbalized understanding.

## 2015-04-13 ENCOUNTER — Ambulatory Visit: Payer: Medicare Other | Admitting: Family Medicine

## 2015-04-13 ENCOUNTER — Encounter: Payer: Self-pay | Admitting: Internal Medicine

## 2015-04-13 ENCOUNTER — Ambulatory Visit (INDEPENDENT_AMBULATORY_CARE_PROVIDER_SITE_OTHER): Payer: Medicare Other | Admitting: Internal Medicine

## 2015-04-13 VITALS — BP 98/68 | HR 104 | Temp 99.8°F | Resp 14 | Ht 64.0 in | Wt 138.8 lb

## 2015-04-13 DIAGNOSIS — M6281 Muscle weakness (generalized): Secondary | ICD-10-CM

## 2015-04-13 DIAGNOSIS — D649 Anemia, unspecified: Secondary | ICD-10-CM | POA: Diagnosis not present

## 2015-04-13 DIAGNOSIS — J449 Chronic obstructive pulmonary disease, unspecified: Secondary | ICD-10-CM | POA: Diagnosis not present

## 2015-04-13 DIAGNOSIS — Z09 Encounter for follow-up examination after completed treatment for conditions other than malignant neoplasm: Secondary | ICD-10-CM | POA: Diagnosis not present

## 2015-04-13 DIAGNOSIS — R636 Underweight: Secondary | ICD-10-CM | POA: Diagnosis not present

## 2015-04-13 DIAGNOSIS — J432 Centrilobular emphysema: Secondary | ICD-10-CM

## 2015-04-13 DIAGNOSIS — I10 Essential (primary) hypertension: Secondary | ICD-10-CM

## 2015-04-13 MED ORDER — METHYLPREDNISOLONE ACETATE 40 MG/ML IJ SUSP
40.0000 mg | Freq: Once | INTRAMUSCULAR | Status: AC
  Administered 2015-04-13: 40 mg via INTRAMUSCULAR

## 2015-04-13 MED ORDER — CYANOCOBALAMIN 1000 MCG/ML IJ SOLN
1000.0000 ug | Freq: Once | INTRAMUSCULAR | Status: AC
  Administered 2015-04-13: 1000 ug via INTRAMUSCULAR

## 2015-04-13 NOTE — Patient Instructions (Signed)
I gave you a shot of Dep Medrol and a b12 injection to help "perk" you up."  You might like the taste of Premier Protein chocolate shakes,  Mixed with ice cream.. It has 30 g of protein and is low in sugar.  I will re order your PT with cory if you let me know which agency he came from  I'll see you again in  2 weeks

## 2015-04-13 NOTE — Progress Notes (Signed)
Subjective:  Patient ID: Anna Prince, female    DOB: 15-Dec-1931  Age: 81 y.o. MRN: [id number]  CC: The primary encounter diagnosis was Underweight. Diagnoses of Anemia, unspecified anemia type, Centrilobular emphysema ([place]), Generalized muscle weakness, Essential hypertension, and Hospital discharge follow-up were also pertinent to this visit.  HPI Anna Prince presents for hospital follow up  She was admitted to [facility] on  12/17 with sepsis syndrome  secondary to viral gastroenteritis with diarrhea and resulting dehydration and discharged on 12/18. This was her second hospitalization this year.  She was hospitalized in October with community acquired pneumonia and COPD exacerbation with sepsis.   She does not feel significantly better despite resolution of diarrhea.  Her appetite is  poor, and she feels chilled all the time.  She is short of breath,  And states that she is not smoking as much as before the hospitalization but her family disagrees.  She is undertreating her COPD,  Using only the albuterol,  twice daily or more  Because she can't afford the other MDIs    She is not walking without assistance.  Her memory has been declining, and her family hs been staying with her .  Marland Kitchen Needs Physical therapy reinstated,  Requesting Tommi Rumps  But not sure which agency he came from m     No facility-administered medications prior to visit.   Outpatient Prescriptions Prior to Visit  Medication Sig Dispense Refill  . albuterol (PROVENTIL HFA;VENTOLIN HFA) 108 (90 BASE) MCG/ACT inhaler Inhale 2 puffs into the lungs every 6 (six) hours as needed for wheezing. 8.5 g 11  . diazepam (VALIUM) 5 MG tablet TAKE 1 TABLET BY MOUTH TWICE DAILY AS NEEDED 60 tablet 1  . HYDROcodone-acetaminophen (NORCO/VICODIN) 5-325 MG tablet TAKE 1 TABLET BY MOUTH EVERY 6 HOURS as needed for back and hip pain 60 tablet 0  . lisinopril (PRINIVIL,ZESTRIL) 20 MG tablet TAKE 1 TABLET BY MOUTH EVERY DAY 90 tablet 1  .  lovastatin (MEVACOR) 40 MG tablet TAKE 1 TABLET BY MOUTH EVERY DAY 90 tablet 0  . meloxicam (MOBIC) 15 MG tablet Take 1 tablet (15 mg total) by mouth daily. 30 tablet 5  . mirtazapine (REMERON) 30 MG tablet TAKE 1 TABLET(30 MG) BY MOUTH AT BEDTIME 30 tablet 0  . omeprazole (PRILOSEC) 40 MG capsule Take 1 capsule (40 mg total) by mouth daily. 90 capsule 3  . PARoxetine (PAXIL) 20 MG tablet TAKE 1 TABLET(20 MG) BY MOUTH DAILY 30 tablet 0  . cyanocobalamin 2000 MCG tablet Take 2,000 mcg by mouth daily.    Marland Kitchen PARoxetine (PAXIL) 20 MG tablet TAKE 1 TABLET BY MOUTH EVERY DAY 30 tablet 0    Review of Systems;  Patient denies headache, fevers, malaise, unintentional weight loss, skin rash, eye pain, sinus congestion and sinus pain, sore throat, dysphagia,  hemoptysis , cough,, chest pain, palpitations, orthopnea, edema, abdominal pain, nausea, melena, diarrhea, constipation, flank pain, dysuria, hematuria, urinary  Frequency, nocturia, numbness, tingling, seizures,  Focal weakness, Loss of consciousness,  Tremor, insomnia, depression, anxiety, and suicidal ideation.      Objective:  BP 98/68 mmHg  Pulse 104  Temp(Src) 99.8 F (37.7 C) (Oral)  Resp 14  Ht 5\' 4"  (1.626 m)  Wt 138 lb 12 oz (62.937 kg)  BMI 23.80 kg/m2  SpO2 95%  BP Readings from Last 3 Encounters:  04/16/15 95/53  04/13/15 98/68  04/10/15 135/63    Wt Readings from Last 3 Encounters:  04/14/15 138 lb (62.596  kg)  04/13/15 138 lb 12 oz (62.937 kg)  04/10/15 143 lb 11.2 oz (65.182 kg)    General appearance: sickly appearing , seated in a wheel chair, appears stated age Ears: normal TM's and external ear canals both ears Throat: lips, mucosa, and tongue normal; teeth and gums normal Neck: no adenopathy, no carotid bruit, supple, symmetrical, trachea midline and thyroid not enlarged, symmetric, no tenderness/mass/nodules Back: symmetric, no curvature. ROM normal. No CVA tenderness. Lungs: clear to auscultation  bilaterally, Heart: regular rate and rhythm, S1, S2 normal, no murmur, click, rub or gallop Abdomen: soft, non-tender; bowel sounds normal; no masses,  no organomegaly Pulses: 2+ and symmetric Skin: Skin color, texture, turgor normal. No rashes or lesions Lymph nodes: Cervical, supraclavicular, and axillary nodes normal.  No results found for: HGBA1C  Lab Results  Component Value Date   CREATININE 1.05* 04/16/2015   CREATININE 1.29* 04/15/2015   CREATININE 1.31* 04/14/2015    Lab Results  Component Value Date   WBC 9.2 04/15/2015   HGB 11.0* 04/15/2015   HCT 32.8* 04/15/2015   PLT 162 04/15/2015   GLUCOSE 109* 04/16/2015   CHOL 169 08/24/2014   TRIG 91.0 08/24/2014   HDL 66.10 08/24/2014   LDLDIRECT 94.0 11/25/2014   LDLCALC 85 08/24/2014   ALT 29 04/14/2015   AST 39 04/14/2015   NA 140 04/16/2015   K 3.5 04/16/2015   CL 109 04/16/2015   CREATININE 1.05* 04/16/2015   BUN 29* 04/16/2015   CO2 23 04/16/2015   TSH 0.484 04/09/2015   INR 1.04 04/14/2015    Dg Chest Port 1 View  04/09/2015  CLINICAL DATA:  Sepsis. Nausea and vomiting. Dizziness and weakness. EXAM: PORTABLE CHEST 1 VIEW COMPARISON:  02/02/2015 FINDINGS: Lungs remain hyperinflated. Improved right basilar aeration with minimal residual atelectasis. Cardiomediastinal contours are unchanged with tortuosity of the thoracic aorta. The heart size normal. Retrocardiac hiatal hernia is again seen. No pulmonary edema, pleural effusion or pneumothorax. No acute osseous abnormalities are seen. IMPRESSION: Improved right basilar aeration with minimal residual atelectasis. No new airspace disease. Electronically Signed   By: Jeb Levering M.D.   On: 04/09/2015 01:54    Assessment & Plan:   Problem List Items Addressed This Visit    Hypertension    Medications were suspended due to hypotension and wre resumed at discharge without change.       COPD (chronic obstructive pulmonary disease) (HCC)    undertreated  secondary to cost of medications, and aggravated by continued tobacco abuse.  Depo Medrol was given today.       Relevant Medications   methylPREDNISolone acetate (DEPO-MEDROL) injection 40 mg (Completed)   Underweight - Primary     I have reviewed her diet and recommended that she increase her protein intake with use of nutritional supplements. .       Generalized muscle weakness    Secondary to weight loss, dehydration from recent viral illness,  And chronic illness,  Home PT to be ordered. She was given a dose of IM B12       Hospital discharge follow-up    hospital follow up.  I have reviewed the records from the hospital admission in detail with patient today.  There was no evidence of bacterial sepsis or bacterial gastroenteritis.  PT will be ordered.       Anemia   Relevant Medications   cyanocobalamin ((VITAMIN B-12)) injection 1,000 mcg (Completed)      I am having Ms. Humble maintain her omeprazole,  meloxicam, albuterol, lisinopril, PARoxetine, mirtazapine, lovastatin, diazepam, and HYDROcodone-acetaminophen. We administered cyanocobalamin and methylPREDNISolone acetate.  Meds ordered this encounter  Medications  . cyanocobalamin ((VITAMIN B-12)) injection 1,000 mcg    Sig:   . methylPREDNISolone acetate (DEPO-MEDROL) injection 40 mg    Sig:     There are no discontinued medications.  Follow-up: Return in about 2 weeks (around 04/27/2015).   Crecencio Mc, MD

## 2015-04-13 NOTE — Progress Notes (Signed)
Pre-visit discussion using our clinic review tool. No additional management support is needed unless otherwise documented below in the visit note.  

## 2015-04-14 ENCOUNTER — Emergency Department: Payer: Medicare Other

## 2015-04-14 ENCOUNTER — Encounter: Payer: Self-pay | Admitting: Emergency Medicine

## 2015-04-14 ENCOUNTER — Inpatient Hospital Stay
Admission: EM | Admit: 2015-04-14 | Discharge: 2015-04-18 | DRG: 871 | Disposition: A | Discharge status: Home Health | Payer: Medicare Other | Attending: Specialist | Admitting: Specialist

## 2015-04-14 DIAGNOSIS — F329 Major depressive disorder, single episode, unspecified: Secondary | ICD-10-CM | POA: Diagnosis present

## 2015-04-14 DIAGNOSIS — F17210 Nicotine dependence, cigarettes, uncomplicated: Secondary | ICD-10-CM | POA: Diagnosis present

## 2015-04-14 DIAGNOSIS — M810 Age-related osteoporosis without current pathological fracture: Secondary | ICD-10-CM | POA: Diagnosis present

## 2015-04-14 DIAGNOSIS — A419 Sepsis, unspecified organism: Secondary | ICD-10-CM | POA: Diagnosis not present

## 2015-04-14 DIAGNOSIS — Z79899 Other long term (current) drug therapy: Secondary | ICD-10-CM

## 2015-04-14 DIAGNOSIS — J189 Pneumonia, unspecified organism: Secondary | ICD-10-CM | POA: Diagnosis present

## 2015-04-14 DIAGNOSIS — R6520 Severe sepsis without septic shock: Secondary | ICD-10-CM | POA: Diagnosis present

## 2015-04-14 DIAGNOSIS — K2270 Barrett's esophagus without dysplasia: Secondary | ICD-10-CM | POA: Diagnosis present

## 2015-04-14 DIAGNOSIS — E785 Hyperlipidemia, unspecified: Secondary | ICD-10-CM | POA: Diagnosis present

## 2015-04-14 DIAGNOSIS — H3530 Unspecified macular degeneration: Secondary | ICD-10-CM | POA: Diagnosis present

## 2015-04-14 DIAGNOSIS — J440 Chronic obstructive pulmonary disease with acute lower respiratory infection: Secondary | ICD-10-CM | POA: Diagnosis present

## 2015-04-14 DIAGNOSIS — I10 Essential (primary) hypertension: Secondary | ICD-10-CM | POA: Diagnosis present

## 2015-04-14 DIAGNOSIS — Z885 Allergy status to narcotic agent status: Secondary | ICD-10-CM

## 2015-04-14 DIAGNOSIS — F0150 Vascular dementia without behavioral disturbance: Secondary | ICD-10-CM | POA: Diagnosis present

## 2015-04-14 DIAGNOSIS — J449 Chronic obstructive pulmonary disease, unspecified: Secondary | ICD-10-CM | POA: Diagnosis present

## 2015-04-14 DIAGNOSIS — G894 Chronic pain syndrome: Secondary | ICD-10-CM | POA: Diagnosis present

## 2015-04-14 DIAGNOSIS — R7989 Other specified abnormal findings of blood chemistry: Secondary | ICD-10-CM

## 2015-04-14 DIAGNOSIS — R778 Other specified abnormalities of plasma proteins: Secondary | ICD-10-CM

## 2015-04-14 DIAGNOSIS — I2510 Atherosclerotic heart disease of native coronary artery without angina pectoris: Secondary | ICD-10-CM | POA: Diagnosis present

## 2015-04-14 DIAGNOSIS — F419 Anxiety disorder, unspecified: Secondary | ICD-10-CM | POA: Diagnosis present

## 2015-04-14 DIAGNOSIS — N179 Acute kidney failure, unspecified: Secondary | ICD-10-CM | POA: Diagnosis present

## 2015-04-14 DIAGNOSIS — K219 Gastro-esophageal reflux disease without esophagitis: Secondary | ICD-10-CM | POA: Diagnosis present

## 2015-04-14 DIAGNOSIS — E559 Vitamin D deficiency, unspecified: Secondary | ICD-10-CM | POA: Diagnosis present

## 2015-04-14 DIAGNOSIS — I2583 Coronary atherosclerosis due to lipid rich plaque: Secondary | ICD-10-CM

## 2015-04-14 LAB — COMPREHENSIVE METABOLIC PANEL
ALK PHOS: 99 U/L (ref 38–126)
ALT: 29 U/L (ref 14–54)
ANION GAP: 9 (ref 5–15)
AST: 39 U/L (ref 15–41)
Albumin: 2.6 g/dL — ABNORMAL LOW (ref 3.5–5.0)
BILIRUBIN TOTAL: 0.6 mg/dL (ref 0.3–1.2)
BUN: 28 mg/dL — ABNORMAL HIGH (ref 6–20)
CALCIUM: 8.5 mg/dL — AB (ref 8.9–10.3)
CO2: 24 mmol/L (ref 22–32)
Chloride: 104 mmol/L (ref 101–111)
Creatinine, Ser: 1.31 mg/dL — ABNORMAL HIGH (ref 0.44–1.00)
GFR calc non Af Amer: 37 mL/min — ABNORMAL LOW (ref >60)
GFR, EST AFRICAN AMERICAN: 43 mL/min — AB (ref >60)
Glucose, Bld: 145 mg/dL — ABNORMAL HIGH (ref 65–99)
Potassium: 3.5 mmol/L (ref 3.5–5.1)
SODIUM: 137 mmol/L (ref 135–145)
TOTAL PROTEIN: 6.3 g/dL — AB (ref 6.5–8.1)

## 2015-04-14 LAB — CBC WITH DIFFERENTIAL/PLATELET
BASOS ABS: 0.1 10*3/uL (ref 0–0.1)
Basophils Relative: 1 %
EOS ABS: 0 10*3/uL (ref 0–0.7)
EOS PCT: 0 %
HCT: 32.8 % — ABNORMAL LOW (ref 35.0–47.0)
Hemoglobin: 10.8 g/dL — ABNORMAL LOW (ref 12.0–16.0)
Lymphocytes Relative: 2 %
Lymphs Abs: 0.3 10*3/uL — ABNORMAL LOW (ref 1.0–3.6)
MCH: 30.5 pg (ref 26.0–34.0)
MCHC: 32.9 g/dL (ref 32.0–36.0)
MCV: 92.8 fL (ref 80.0–100.0)
Monocytes Absolute: 0.8 10*3/uL (ref 0.2–0.9)
Monocytes Relative: 7 %
Neutro Abs: 10.8 10*3/uL — ABNORMAL HIGH (ref 1.4–6.5)
Neutrophils Relative %: 90 %
PLATELETS: 154 10*3/uL (ref 150–440)
RBC: 3.54 MIL/uL — AB (ref 3.80–5.20)
RDW: 13.6 % (ref 11.5–14.5)
WBC: 12 10*3/uL — AB (ref 3.6–11.0)

## 2015-04-14 LAB — TROPONIN I: TROPONIN I: 1.09 ng/mL — AB (ref <0.031)

## 2015-04-14 LAB — LIPASE, BLOOD: Lipase: 31 U/L (ref 11–51)

## 2015-04-14 LAB — PROTIME-INR
INR: 1.04
Prothrombin Time: 13.8 seconds (ref 11.4–15.0)

## 2015-04-14 LAB — APTT: APTT: 31 s (ref 24–36)

## 2015-04-14 MED ORDER — DEXTROSE 5 % IV SOLN
2.0000 g | Freq: Once | INTRAVENOUS | Status: AC
  Administered 2015-04-14: 2 g via INTRAVENOUS
  Filled 2015-04-14: qty 2

## 2015-04-14 MED ORDER — SODIUM CHLORIDE 0.9 % IV BOLUS (SEPSIS)
1000.0000 mL | INTRAVENOUS | Status: AC
  Administered 2015-04-14 (×2): 1000 mL via INTRAVENOUS

## 2015-04-14 MED ORDER — ASPIRIN 81 MG PO CHEW
324.0000 mg | CHEWABLE_TABLET | Freq: Once | ORAL | Status: AC
  Administered 2015-04-14: 324 mg via ORAL
  Filled 2015-04-14: qty 4

## 2015-04-14 MED ORDER — HEPARIN (PORCINE) IN NACL 100-0.45 UNIT/ML-% IJ SOLN
750.0000 [IU]/h | INTRAMUSCULAR | Status: DC
  Administered 2015-04-14: 750 [IU]/h via INTRAVENOUS
  Filled 2015-04-14: qty 250

## 2015-04-14 MED ORDER — VANCOMYCIN HCL IN DEXTROSE 1-5 GM/200ML-% IV SOLN
1000.0000 mg | Freq: Once | INTRAVENOUS | Status: AC
  Administered 2015-04-14: 1000 mg via INTRAVENOUS
  Filled 2015-04-14: qty 200

## 2015-04-14 MED ORDER — ALBUTEROL SULFATE (2.5 MG/3ML) 0.083% IN NEBU
5.0000 mg | INHALATION_SOLUTION | Freq: Once | RESPIRATORY_TRACT | Status: AC
  Administered 2015-04-14: 5 mg via RESPIRATORY_TRACT
  Filled 2015-04-14: qty 6

## 2015-04-14 MED ORDER — LEVOFLOXACIN IN D5W 750 MG/150ML IV SOLN
750.0000 mg | Freq: Once | INTRAVENOUS | Status: AC
  Administered 2015-04-14: 750 mg via INTRAVENOUS
  Filled 2015-04-14: qty 150

## 2015-04-14 MED ORDER — ACETAMINOPHEN 325 MG PO TABS
650.0000 mg | ORAL_TABLET | Freq: Once | ORAL | Status: AC | PRN
  Administered 2015-04-14: 650 mg via ORAL
  Filled 2015-04-14: qty 2

## 2015-04-14 MED ORDER — HEPARIN BOLUS VIA INFUSION
3800.0000 [IU] | Freq: Once | INTRAVENOUS | Status: AC
  Administered 2015-04-14: 3800 [IU] via INTRAVENOUS
  Filled 2015-04-14: qty 3800

## 2015-04-14 NOTE — Progress Notes (Signed)
ANTICOAGULATION CONSULT NOTE - Initial Consult  Pharmacy Consult for heparin Indication: chest pain/ACS  Allergies  Allergen Reactions  . Morphine Sulfate Other (See Comments)    ANALGESICS-OPIOID Hallucinations    Patient Measurements: Height: 5\' 6"  (167.6 cm) Weight: 138 lb (62.596 kg) IBW/kg (Calculated) : 59.3 Heparin Dosing Weight: 62 kg  Vital Signs: Temp: 99.6 F (37.6 C) (12/22 2233) Temp Source: Oral (12/22 2233) BP: 122/55 mmHg (12/22 2233) Pulse Rate: 101 (12/22 2200)  Labs:  Recent Labs  04/14/15 2127  HGB 10.8*  HCT 32.8*  PLT 154  APTT 31  LABPROT 13.8  INR 1.04  CREATININE 1.31*  TROPONINI 1.09*    Estimated Creatinine Clearance: 31.5 mL/min (by C-G formula based on Cr of 1.31).   Medical History: Past Medical History  Diagnosis Date  . Barrett's esophagus 2007  . Sigmoid diverticulosis     by colonoscopy  . Aortic aneurysm ([place])   . Macular degeneration   . Anxiety   . Hyperlipidemia   . Hypertension   . Osteoporosis   . Lumbago   . Depression   . Bursitis 2012    left hip, improved with periodic steroid injection [facility] [place])  . Anemia 2012    of acute blood loss, resolved  . GERD (gastroesophageal reflux disease)     with Barretts Esophagus  . COPD (chronic obstructive pulmonary disease) ([place])   . Arthritis     Medications:  Infusions:  . ceFEPime (MAXIPIME) IV 2 g (04/14/15 2248)  . heparin    . levofloxacin (LEVAQUIN) IV 750 mg (04/14/15 2148)  . sodium chloride 1,000 mL (04/14/15 2128)  . vancomycin      Assessment: 83 yof cc SOB starting therapy in ED for PNA. Troponin 1.09 but symptoms not consistent with ACS per provider note. Starting heparin infusion and will monitor serial troponin.  Goal of Therapy:  Heparin level 0.3-0.7 units/ml Monitor platelets by anticoagulation protocol: Yes   Plan:  Give 3800 units bolus x 1 Start heparin infusion at 750 units/hr Check anti-Xa level in 8 hours and daily while  on heparin Continue to monitor H&H and platelets  Laural Benes, Pharm.D., BCPS Clinical Pharmacist 04/14/2015,10:58 PM

## 2015-04-14 NOTE — ED Provider Notes (Signed)
[facility] Emergency Department Provider Note  ____________________________________________  Time seen: 8:55 PM  I have reviewed the triage vital signs and the nursing notes.   HISTORY  Chief Complaint Shortness of Breath    HPI Anna Prince is a 81 y.o. female who complains of shortness of breath and cough. She was recently hospitalized 5 days ago for abdominal pain and diarrhea which is determined to be viral in nature. She was discharged home and followed up with her primary care doctor yesterday who made no new interventions. However the patient is now been having coughing and shortness of breath is been progressively worsening. No diarrhea or abdominal pain currently no chest pain. She feels fever and chills subjectively.  EMS report room air saturation was 88% on their arrival.   Past Medical History  Diagnosis Date  . Barrett's esophagus 2007  . Sigmoid diverticulosis     by colonoscopy  . Aortic aneurysm ([place])   . Macular degeneration   . Anxiety   . Hyperlipidemia   . Hypertension   . Osteoporosis   . Lumbago   . Depression   . Bursitis 2012    left hip, improved with periodic steroid injection [facility] [place])  . Anemia 2012    of acute blood loss, resolved  . GERD (gastroesophageal reflux disease)     with Barretts Esophagus  . COPD (chronic obstructive pulmonary disease) ([place])   . Arthritis      Patient Active Problem List   Diagnosis Date Noted  . Intractable nausea and vomiting 04/09/2015  . Generalized weakness 02/12/2015  . Candida esophagitis ([place]) 02/10/2015  . CAP (community acquired pneumonia) 02/03/2015  . AKI (acute kidney injury) ([place]) 02/03/2015  . Underweight 11/27/2014  . Hyperkalemia 11/27/2014  . Chronic left hip pain 11/27/2014  . Anemia 11/25/2014  . Elevated liver enzymes 08/25/2014  . Pubic ramus fracture ([place]) 08/25/2014  . Vascular dementia of acute onset without behavioral disturbance 02/20/2014   . Chronic pain syndrome 05/27/2013  . Tobacco abuse counseling 04/10/2013  . Skin lesion of face 02/22/2012  . GERD (gastroesophageal reflux disease)   . Screening for colon cancer 06/18/2011  . COPD (chronic obstructive pulmonary disease) ([place]) 06/18/2011  . Bursitis   . Skin cancer of nose 12/17/2010  . Vitamin D deficiency 12/17/2010  . Hyperlipidemia LDL goal <100 12/17/2010  . Screening for malignant neoplasm of breast 12/17/2010  . Encounter for long-term (current) use of other medications 12/17/2010  . Aortic aneurysm, abdominal ([place]) 12/17/2010  . Diverticulosis, sigmoid 12/08/2010  . Barrett's esophagus 12/08/2010  . Hyperlipidemia 12/08/2010  . Hypertension 12/08/2010  . Lumbago 12/08/2010  . Osteoporosis 12/08/2010  . Depression 12/08/2010     Past Surgical History  Procedure Laterality Date  . Appendectomy    . Back surgery      X2..1975 arachnoid cyst cervical region([place]);1997 lumbosacral tumor,9 hr surgery  . Abdominal aortic aneurysm repair  July 2010    [facility]     Current Outpatient Rx  Name  Route  Sig  Dispense  Refill  . diazepam (VALIUM) 5 MG tablet      TAKE 1 TABLET BY MOUTH TWICE DAILY AS NEEDED   60 tablet   1   . HYDROcodone-acetaminophen (NORCO/VICODIN) 5-325 MG tablet      TAKE 1 TABLET BY MOUTH EVERY 6 HOURS as needed for back and hip pain   60 tablet   0     Maximum two daily.  Refill on  or after Apr 27 2015   . lisinopril (PRINIVIL,ZESTRIL) 20 MG tablet      TAKE 1 TABLET BY MOUTH EVERY DAY   90 tablet   1   . lovastatin (MEVACOR) 40 MG tablet      TAKE 1 TABLET BY MOUTH EVERY DAY   90 tablet   0   . meloxicam (MOBIC) 15 MG tablet   Oral   Take 1 tablet (15 mg total) by mouth daily.   30 tablet   5   . mirtazapine (REMERON) 30 MG tablet      TAKE 1 TABLET(30 MG) BY MOUTH AT BEDTIME   30 tablet   0   . omeprazole (PRILOSEC) 40 MG capsule   Oral   Take 1 capsule (40 mg total) by mouth daily.   90  capsule   3   . PARoxetine (PAXIL) 20 MG tablet      TAKE 1 TABLET(20 MG) BY MOUTH DAILY   30 tablet   0   . albuterol (PROVENTIL HFA;VENTOLIN HFA) 108 (90 BASE) MCG/ACT inhaler   Inhalation   Inhale 2 puffs into the lungs every 6 (six) hours as needed for wheezing.   8.5 g   11   . PARoxetine (PAXIL) 20 MG tablet      TAKE 1 TABLET BY MOUTH EVERY DAY   30 tablet   0      Allergies Morphine sulfate   Family History  Problem Relation Age of Onset  . Coronary artery disease Mother   . Coronary artery disease Father 36  . Cancer Neg Hx     no breast,coon or ovarian ca    Social History Social History  Substance Use Topics  . Smoking status: Current Every Day Smoker -- 0.50 packs/day    Types: Cigarettes  . Smokeless tobacco: Never Used     Comment: HAS BEEN SMOKING 30+ YEARS,RESUMED TOBACCO USE AFTER AAA REPAIR  . Alcohol Use: No    Review of Systems  Constitutional:   No fever or chills. No weight changes Eyes:   No blurry vision or double vision.  ENT:   No sore throat. Cardiovascular:   No chest pain. Respiratory:   Positive shortness of breath and cough. Gastrointestinal:   Negative for abdominal pain, vomiting and diarrhea.  No BRBPR or melena. Genitourinary:   Negative for dysuria, urinary retention, bloody urine, or difficulty urinating. Musculoskeletal:   Negative for back pain. No joint swelling or pain. Skin:   Negative for rash. Neurological:   Negative for headaches, focal weakness or numbness. Psychiatric:  No anxiety or depression.   Endocrine:  No hot/cold intolerance, changes in energy, or sleep difficulty.  10-point ROS otherwise negative.  ____________________________________________   PHYSICAL EXAM:  VITAL SIGNS: ED Triage Vitals  Enc Vitals Group     BP 04/14/15 2104 128/67 mmHg     Pulse Rate 04/14/15 2104 108     Resp 04/14/15 2104 22     Temp 04/14/15 2104 101.8 F (38.8 C)     Temp Source 04/14/15 2104 Oral     SpO2  04/14/15 2055 88 %     Weight 04/14/15 2104 138 lb (62.596 kg)     Height 04/14/15 2104 5\' 6"  (1.676 m)     Head Cir --      Peak Flow --      Pain Score 04/14/15 2105 6     Pain Loc --      Pain Edu? --  Excl. in [place]? --     Vital signs reviewed, nursing assessments reviewed.   Constitutional:   Alert and oriented. Well appearing and in no distress. Eyes:   No scleral icterus. No conjunctival pallor. PERRL. EOMI ENT   Head:   Normocephalic and atraumatic.   Nose:   No congestion/rhinnorhea. No septal hematoma   Mouth/Throat:   Dry mucous membranes, no pharyngeal erythema. No peritonsillar mass. No uvula shift.   Neck:   No stridor. No SubQ emphysema. No meningismus. Hematological/Lymphatic/Immunilogical:   No cervical lymphadenopathy. Cardiovascular:   RRR. Normal and symmetric distal pulses are present in all extremities. No murmurs, rubs, or gallops. Respiratory:   Normal respiratory effort without tachypnea nor retractions. Breath sounds are clear and equal bilaterally. No wheezes/rales/rhonchi. Gastrointestinal:   Soft and nontender. No distention. There is no CVA tenderness.  No rebound, rigidity, or guarding. Genitourinary:   deferred Musculoskeletal:   Nontender with normal range of motion in all extremities. No joint effusions.  No lower extremity tenderness.  No edema. Neurologic:   Normal speech and language.  CN 2-10 normal. Motor grossly intact. No pronator drift.  Normal gait. No gross focal neurologic deficits are appreciated.  Skin:    Skin is warm, dry and intact. No rash noted.  No petechiae, purpura, or bullae. Poor skin turgor, normal capillary refill Psychiatric:   Mood and affect are normal. Speech and behavior are normal. Patient exhibits appropriate insight and judgment.  ____________________________________________    LABS (pertinent positives/negatives) (all labs ordered are listed, but only abnormal results are displayed) Labs  Reviewed  COMPREHENSIVE METABOLIC PANEL - Abnormal; Notable for the following:    Glucose, Bld 145 (*)    BUN 28 (*)    Creatinine, Ser 1.31 (*)    Calcium 8.5 (*)    Total Protein 6.3 (*)    Albumin 2.6 (*)    GFR calc non Af Amer 37 (*)    GFR calc Af Amer 43 (*)    All other components within normal limits  TROPONIN I - Abnormal; Notable for the following:    Troponin I 1.09 (*)    All other components within normal limits  CBC WITH DIFFERENTIAL/PLATELET - Abnormal; Notable for the following:    WBC 12.0 (*)    RBC 3.54 (*)    Hemoglobin 10.8 (*)    HCT 32.8 (*)    Neutro Abs 10.8 (*)    Lymphs Abs 0.3 (*)    All other components within normal limits  CULTURE, BLOOD (ROUTINE X 2)  CULTURE, BLOOD (ROUTINE X 2)  URINE CULTURE  CULTURE, EXPECTORATED SPUTUM-ASSESSMENT  LIPASE, BLOOD  APTT  PROTIME-INR  LACTIC ACID, PLASMA  LACTIC ACID, PLASMA  URINALYSIS COMPLETEWITH MICROSCOPIC ([facility] ONLY)   ____________________________________________   EKG  Interpreted by me Sinus tachycardia rate 107, normal axis, normal intervals. Poor R-wave progression in anterior precordial leads, normal ST segments and T waves. No acute ischemic changes.  ____________________________________________    RADIOLOGY  Chest x-ray shows infiltrate in the right lower lung consistent with pneumonia  ____________________________________________   PROCEDURES CRITICAL CARE Performed by: Joni Fears, Andromeda Poppen   Total critical care time: 35 minutes  Critical care time was exclusive of separately billable procedures and treating other patients.  Critical care was necessary to treat or prevent imminent or life-threatening deterioration.  Critical care was time spent personally by me on the following activities: development of treatment plan with patient and/or surrogate as well as nursing, discussions with consultants, evaluation of  patient's response to treatment, examination of patient, obtaining  history from patient or surrogate, ordering and performing treatments and interventions, ordering and review of laboratory studies, ordering and review of radiographic studies, pulse oximetry and re-evaluation of patient's condition.   ____________________________________________   INITIAL IMPRESSION / ASSESSMENT AND PLAN / ED COURSE  Pertinent labs & imaging results that were available during my care of the patient were reviewed by me and considered in my medical decision making (see chart for details).  Patient presents with shortness of breath and cough, fever tachycardia and hypoxia in the setting of recent hospitalization concerning for healthcare associated pneumonia. With her multiple SIRS criteria we will initiate a code sepsis and treat her with vancomycin and Zosyn and Levaquin and 2 L of IV saline. Will give supplemental oxygen as needed and plan for admission after initiating the workup.  ----------------------------------------- 10:09 PM on 04/14/2015 -----------------------------------------  Vitals remained stable, heart rate is stable at 105 as well. X-ray consistent with pneumonia. Labs are significant for a leukocytosis and a troponin of 1.09. The patient's symptoms are not consistent with ACS.  I'll give aspirin and initiate heparin infusion for now and plan for serial troponins to monitor this.Burnis Medin relay results to hospitalist with admission.   ____________________________________________   FINAL CLINICAL IMPRESSION(S) / ED DIAGNOSES  Final diagnoses:  HCAP (healthcare-associated pneumonia)  Sepsis, due to unspecified organism ([place])  Elevated troponin I level      Carrie Mew, MD 04/14/15 2240

## 2015-04-14 NOTE — ED Notes (Signed)
ems pt from home called out for shortness of breath and cough, pt recently dx with pneumonia.

## 2015-04-14 NOTE — Progress Notes (Signed)
ANTIBIOTIC CONSULT NOTE - INITIAL  Pharmacy Consult for vancomycin/cefepime Indication: pneumonia  Allergies  Allergen Reactions  . Morphine Sulfate Other (See Comments)    ANALGESICS-OPIOID Hallucinations    Patient Measurements: Height: 5\' 6"  (167.6 cm) Weight: 138 lb (62.596 kg) IBW/kg (Calculated) : 59.3 Adjusted Body Weight: 62 kg  Vital Signs: Temp: 99.6 F (37.6 C) (12/22 2233) Temp Source: Oral (12/22 2233) BP: 122/55 mmHg (12/22 2233) Pulse Rate: 101 (12/22 2200) Intake/Output from previous day:   Intake/Output from this shift:    Labs:  Recent Labs  04/14/15 2127  WBC 12.0*  HGB 10.8*  PLT 154  CREATININE 1.31*   Estimated Creatinine Clearance: 31.5 mL/min (by C-G formula based on Cr of 1.31). No results for input(s): VANCOTROUGH, VANCOPEAK, VANCORANDOM, GENTTROUGH, GENTPEAK, GENTRANDOM, TOBRATROUGH, TOBRAPEAK, TOBRARND, AMIKACINPEAK, AMIKACINTROU, AMIKACIN in the last 72 hours.   Microbiology: Recent Results (from the past 720 hour(s))  Gastrointestinal Panel by PCR , Stool     Status: None   Collection Time: 04/08/15 12:20 PM  Result Value Ref Range Status   Campylobacter species NOT DETECTED NOT DETECTED Final   Plesimonas shigelloides NOT DETECTED NOT DETECTED Final   Salmonella species NOT DETECTED NOT DETECTED Final   Yersinia enterocolitica NOT DETECTED NOT DETECTED Final   Vibrio species NOT DETECTED NOT DETECTED Final   Vibrio cholerae NOT DETECTED NOT DETECTED Final   Enteroaggregative E coli (EAEC) NOT DETECTED NOT DETECTED Final   Enteropathogenic E coli (EPEC) NOT DETECTED NOT DETECTED Final   Enterotoxigenic E coli (ETEC) NOT DETECTED NOT DETECTED Final   Shiga like toxin producing E coli (STEC) NOT DETECTED NOT DETECTED Final   E. coli O157 NOT DETECTED NOT DETECTED Final   Shigella/Enteroinvasive E coli (EIEC) NOT DETECTED NOT DETECTED Final   Cryptosporidium NOT DETECTED NOT DETECTED Final   Cyclospora cayetanensis NOT DETECTED  NOT DETECTED Final   Entamoeba histolytica NOT DETECTED NOT DETECTED Final   Giardia lamblia NOT DETECTED NOT DETECTED Final   Adenovirus F40/41 NOT DETECTED NOT DETECTED Final   Astrovirus NOT DETECTED NOT DETECTED Final   Norovirus GI/GII NOT DETECTED NOT DETECTED Final   Rotavirus A NOT DETECTED NOT DETECTED Final   Sapovirus (I, II, IV, and V) NOT DETECTED NOT DETECTED Final  C difficile quick scan w PCR reflex     Status: None   Collection Time: 04/08/15 12:20 PM  Result Value Ref Range Status   C Diff antigen NEGATIVE NEGATIVE Final   C Diff toxin NEGATIVE NEGATIVE Final   C Diff interpretation Negative for C. difficile  Final  Blood Culture (routine x 2)     Status: None (Preliminary result)   Collection Time: 04/09/15  1:31 AM  Result Value Ref Range Status   Specimen Description BLOOD RIGHT ASSIST CONTROL  Final   Special Requests BOTTLES DRAWN AEROBIC AND ANAEROBIC 4CC  Final   Culture NO GROWTH 4 DAYS  Final   Report Status PENDING  Incomplete  Urine culture     Status: None   Collection Time: 04/09/15  1:31 AM  Result Value Ref Range Status   Specimen Description URINE, RANDOM  Final   Special Requests NONE  Final   Culture NO GROWTH 2 DAYS  Final   Report Status 04/11/2015 FINAL  Final  Blood Culture (routine x 2)     Status: None (Preliminary result)   Collection Time: 04/09/15  1:46 AM  Result Value Ref Range Status   Specimen Description BLOOD LEFT ASSIST CONTROL  Final   Special Requests BOTTLES DRAWN AEROBIC AND ANAEROBIC 4CC  Final   Culture NO GROWTH 4 DAYS  Final   Report Status PENDING  Incomplete    Medical History: Past Medical History  Diagnosis Date  . Barrett's esophagus 2007  . Sigmoid diverticulosis     by colonoscopy  . Aortic aneurysm ([place])   . Macular degeneration   . Anxiety   . Hyperlipidemia   . Hypertension   . Osteoporosis   . Lumbago   . Depression   . Bursitis 2012    left hip, improved with periodic steroid injection [facility]  [place])  . Anemia 2012    of acute blood loss, resolved  . GERD (gastroesophageal reflux disease)     with Barretts Esophagus  . COPD (chronic obstructive pulmonary disease) ([place])   . Arthritis     Medications:  Infusions:  . ceFEPime (MAXIPIME) IV 2 g (04/14/15 2248)  . heparin    . levofloxacin (LEVAQUIN) IV 750 mg (04/14/15 2148)  . sodium chloride Stopped (04/14/15 2304)  . vancomycin     Assessment: 83 yof cc SOB/cough. Recently hospitalized 5 days ago for abdominal pain/diarrhea, seemed to be viral in nature. ED finds fever, tachycardia, hypoxia, starting vanc/cefepime for HCAP.   Goal of Therapy:  Vancomycin trough level 15-20 mcg/ml  Plan:  Expected duration 7 days with resolution of temperature and/or normalization of WBC. Cefepime 2 gm IV Q12H and vancomycin 1 gm IV x 1 in ED followed by 750 mg IV Q24H, predicted trough 16 mcg/mL. Pharmacy will continue to monitor and adjust as needed to maintain trough 15 to 20 mcg/mL.  Laural Benes, Pharm.D., BCPS Clinical Pharmacist 04/14/2015,11:06 PM

## 2015-04-15 ENCOUNTER — Inpatient Hospital Stay
Admit: 2015-04-15 | Discharge: 2015-04-15 | Disposition: A | Discharge status: Home / Self Care | Payer: Medicare Other | Attending: Internal Medicine | Admitting: Internal Medicine

## 2015-04-15 ENCOUNTER — Telehealth: Payer: Self-pay | Admitting: Internal Medicine

## 2015-04-15 DIAGNOSIS — F0150 Vascular dementia without behavioral disturbance: Secondary | ICD-10-CM | POA: Diagnosis present

## 2015-04-15 DIAGNOSIS — A419 Sepsis, unspecified organism: Secondary | ICD-10-CM | POA: Diagnosis present

## 2015-04-15 DIAGNOSIS — G894 Chronic pain syndrome: Secondary | ICD-10-CM | POA: Diagnosis present

## 2015-04-15 DIAGNOSIS — R6520 Severe sepsis without septic shock: Secondary | ICD-10-CM | POA: Diagnosis present

## 2015-04-15 DIAGNOSIS — J440 Chronic obstructive pulmonary disease with acute lower respiratory infection: Secondary | ICD-10-CM | POA: Diagnosis present

## 2015-04-15 DIAGNOSIS — N179 Acute kidney failure, unspecified: Secondary | ICD-10-CM | POA: Diagnosis present

## 2015-04-15 DIAGNOSIS — F17210 Nicotine dependence, cigarettes, uncomplicated: Secondary | ICD-10-CM | POA: Diagnosis present

## 2015-04-15 DIAGNOSIS — Z79899 Other long term (current) drug therapy: Secondary | ICD-10-CM | POA: Diagnosis not present

## 2015-04-15 DIAGNOSIS — F419 Anxiety disorder, unspecified: Secondary | ICD-10-CM | POA: Diagnosis present

## 2015-04-15 DIAGNOSIS — E785 Hyperlipidemia, unspecified: Secondary | ICD-10-CM | POA: Diagnosis present

## 2015-04-15 DIAGNOSIS — K2270 Barrett's esophagus without dysplasia: Secondary | ICD-10-CM | POA: Diagnosis present

## 2015-04-15 DIAGNOSIS — Z885 Allergy status to narcotic agent status: Secondary | ICD-10-CM | POA: Diagnosis not present

## 2015-04-15 DIAGNOSIS — H3530 Unspecified macular degeneration: Secondary | ICD-10-CM | POA: Diagnosis present

## 2015-04-15 DIAGNOSIS — M810 Age-related osteoporosis without current pathological fracture: Secondary | ICD-10-CM | POA: Diagnosis present

## 2015-04-15 DIAGNOSIS — E559 Vitamin D deficiency, unspecified: Secondary | ICD-10-CM | POA: Diagnosis present

## 2015-04-15 DIAGNOSIS — F329 Major depressive disorder, single episode, unspecified: Secondary | ICD-10-CM | POA: Diagnosis present

## 2015-04-15 DIAGNOSIS — I10 Essential (primary) hypertension: Secondary | ICD-10-CM | POA: Diagnosis present

## 2015-04-15 DIAGNOSIS — K219 Gastro-esophageal reflux disease without esophagitis: Secondary | ICD-10-CM | POA: Diagnosis present

## 2015-04-15 DIAGNOSIS — J189 Pneumonia, unspecified organism: Secondary | ICD-10-CM | POA: Diagnosis present

## 2015-04-15 LAB — CULTURE, BLOOD (ROUTINE X 2)
CULTURE: NO GROWTH
Culture: NO GROWTH

## 2015-04-15 LAB — URINALYSIS COMPLETE WITH MICROSCOPIC (ARMC ONLY)
BILIRUBIN URINE: NEGATIVE
Glucose, UA: NEGATIVE mg/dL
HGB URINE DIPSTICK: NEGATIVE
KETONES UR: NEGATIVE mg/dL
Leukocytes, UA: NEGATIVE
NITRITE: NEGATIVE
PH: 5 (ref 5.0–8.0)
Protein, ur: 30 mg/dL — AB
RBC / HPF: NONE SEEN RBC/hpf (ref 0–5)
SPECIFIC GRAVITY, URINE: 1.02 (ref 1.005–1.030)

## 2015-04-15 LAB — BASIC METABOLIC PANEL
ANION GAP: 5 (ref 5–15)
BUN: 30 mg/dL — AB (ref 6–20)
CHLORIDE: 113 mmol/L — AB (ref 101–111)
CO2: 23 mmol/L (ref 22–32)
Calcium: 7.7 mg/dL — ABNORMAL LOW (ref 8.9–10.3)
Creatinine, Ser: 1.29 mg/dL — ABNORMAL HIGH (ref 0.44–1.00)
GFR, EST AFRICAN AMERICAN: 44 mL/min — AB (ref >60)
GFR, EST NON AFRICAN AMERICAN: 38 mL/min — AB (ref >60)
Glucose, Bld: 125 mg/dL — ABNORMAL HIGH (ref 65–99)
POTASSIUM: 3.8 mmol/L (ref 3.5–5.1)
SODIUM: 141 mmol/L (ref 135–145)

## 2015-04-15 LAB — HEPARIN LEVEL (UNFRACTIONATED): Heparin Unfractionated: 0.34 IU/mL (ref 0.30–0.70)

## 2015-04-15 LAB — CBC
HEMATOCRIT: 32.8 % — AB (ref 35.0–47.0)
Hemoglobin: 11 g/dL — ABNORMAL LOW (ref 12.0–16.0)
MCH: 31 pg (ref 26.0–34.0)
MCHC: 33.4 g/dL (ref 32.0–36.0)
MCV: 93 fL (ref 80.0–100.0)
PLATELETS: 162 10*3/uL (ref 150–440)
RBC: 3.53 MIL/uL — AB (ref 3.80–5.20)
RDW: 13.8 % (ref 11.5–14.5)
WBC: 9.2 10*3/uL (ref 3.6–11.0)

## 2015-04-15 LAB — TROPONIN I
TROPONIN I: 0.67 ng/mL — AB (ref <0.031)
Troponin I: 0.86 ng/mL — ABNORMAL HIGH (ref <0.031)

## 2015-04-15 LAB — LACTIC ACID, PLASMA: Lactic Acid, Venous: 0.9 mmol/L (ref 0.5–2.0)

## 2015-04-15 MED ORDER — DEXTROSE 5 % IV SOLN
2.0000 g | Freq: Two times a day (BID) | INTRAVENOUS | Status: DC
  Administered 2015-04-15 – 2015-04-17 (×5): 2 g via INTRAVENOUS
  Filled 2015-04-15 (×7): qty 2

## 2015-04-15 MED ORDER — BUDESONIDE 0.25 MG/2ML IN SUSP
0.2500 mg | Freq: Two times a day (BID) | RESPIRATORY_TRACT | Status: DC
  Administered 2015-04-15 – 2015-04-18 (×7): 0.25 mg via RESPIRATORY_TRACT
  Filled 2015-04-15 (×7): qty 2

## 2015-04-15 MED ORDER — ACETAMINOPHEN 325 MG PO TABS
650.0000 mg | ORAL_TABLET | Freq: Four times a day (QID) | ORAL | Status: DC | PRN
  Administered 2015-04-16: 650 mg via ORAL
  Filled 2015-04-15: qty 2

## 2015-04-15 MED ORDER — LEVALBUTEROL HCL 1.25 MG/0.5ML IN NEBU
1.2500 mg | INHALATION_SOLUTION | Freq: Four times a day (QID) | RESPIRATORY_TRACT | Status: DC | PRN
  Administered 2015-04-16 (×3): 1.25 mg via RESPIRATORY_TRACT
  Filled 2015-04-15 (×3): qty 0.5

## 2015-04-15 MED ORDER — GUAIFENESIN-DM 100-10 MG/5ML PO SYRP
5.0000 mL | ORAL_SOLUTION | ORAL | Status: DC | PRN
  Administered 2015-04-15 – 2015-04-18 (×8): 5 mL via ORAL
  Filled 2015-04-15 (×8): qty 5

## 2015-04-15 MED ORDER — GUAIFENESIN ER 600 MG PO TB12
600.0000 mg | ORAL_TABLET | Freq: Two times a day (BID) | ORAL | Status: DC
  Administered 2015-04-15 – 2015-04-18 (×7): 600 mg via ORAL
  Filled 2015-04-15 (×7): qty 1

## 2015-04-15 MED ORDER — SODIUM CHLORIDE 0.9 % IV SOLN
INTRAVENOUS | Status: DC
  Administered 2015-04-15 (×2): via INTRAVENOUS

## 2015-04-15 MED ORDER — CETYLPYRIDINIUM CHLORIDE 0.05 % MT LIQD
7.0000 mL | Freq: Two times a day (BID) | OROMUCOSAL | Status: DC
  Administered 2015-04-15 – 2015-04-17 (×3): 7 mL via OROMUCOSAL

## 2015-04-15 MED ORDER — ONDANSETRON HCL 4 MG/2ML IJ SOLN: 4.0000 mg | Freq: Four times a day (QID) | INTRAMUSCULAR | Status: DC | PRN

## 2015-04-15 MED ORDER — PAROXETINE HCL 10 MG PO TABS
20.0000 mg | ORAL_TABLET | Freq: Every day | ORAL | Status: DC
  Administered 2015-04-15 – 2015-04-18 (×4): 20 mg via ORAL
  Filled 2015-04-15: qty 1
  Filled 2015-04-15: qty 2
  Filled 2015-04-15: qty 1
  Filled 2015-04-15: qty 2

## 2015-04-15 MED ORDER — VANCOMYCIN HCL IN DEXTROSE 750-5 MG/150ML-% IV SOLN
750.0000 mg | INTRAVENOUS | Status: DC
  Filled 2015-04-15 (×3): qty 150

## 2015-04-15 MED ORDER — ONDANSETRON HCL 4 MG PO TABS: 4.0000 mg | ORAL_TABLET | Freq: Four times a day (QID) | ORAL | Status: DC | PRN

## 2015-04-15 MED ORDER — PRAVASTATIN SODIUM 20 MG PO TABS
40.0000 mg | ORAL_TABLET | Freq: Every day | ORAL | Status: DC
  Administered 2015-04-15 – 2015-04-17 (×3): 40 mg via ORAL
  Filled 2015-04-15 (×2): qty 1
  Filled 2015-04-15: qty 2

## 2015-04-15 MED ORDER — DIAZEPAM 5 MG PO TABS
5.0000 mg | ORAL_TABLET | Freq: Two times a day (BID) | ORAL | Status: DC | PRN
  Administered 2015-04-16: 5 mg via ORAL
  Filled 2015-04-15: qty 1

## 2015-04-15 MED ORDER — ACETAMINOPHEN 650 MG RE SUPP: 650.0000 mg | Freq: Four times a day (QID) | RECTAL | Status: DC | PRN

## 2015-04-15 MED ORDER — ENOXAPARIN SODIUM 40 MG/0.4ML ~~LOC~~ SOLN
40.0000 mg | SUBCUTANEOUS | Status: DC
  Administered 2015-04-16 – 2015-04-17 (×2): 40 mg via SUBCUTANEOUS
  Filled 2015-04-15 (×2): qty 0.4

## 2015-04-15 MED ORDER — SODIUM CHLORIDE 0.9 % IJ SOLN
3.0000 mL | Freq: Two times a day (BID) | INTRAMUSCULAR | Status: DC
  Administered 2015-04-15 – 2015-04-18 (×8): 3 mL via INTRAVENOUS

## 2015-04-15 MED ORDER — PANTOPRAZOLE SODIUM 40 MG PO TBEC
40.0000 mg | DELAYED_RELEASE_TABLET | Freq: Every day | ORAL | Status: DC
  Administered 2015-04-15 – 2015-04-18 (×4): 40 mg via ORAL
  Filled 2015-04-15 (×4): qty 1

## 2015-04-15 NOTE — Progress Notes (Signed)
*  PRELIMINARY RESULTS* Echocardiogram 2D Echocardiogram has been performed.  Anna Prince 04/15/2015, 3:50 PM

## 2015-04-15 NOTE — Evaluation (Addendum)
Clinical/Bedside Swallow Evaluation Patient Details  Name: Anna Prince MRN: [id number] Date of Birth: Jun 10, 1931  Today's Date: 04/15/2015 Time: SLP Start Time (ACUTE ONLY): 13 SLP Stop Time (ACUTE ONLY): 1340 SLP Time Calculation (min) (ACUTE ONLY): 60 min  Past Medical History:  Past Medical History  Diagnosis Date  . Barrett's esophagus 2007  . Sigmoid diverticulosis     by colonoscopy  . Aortic aneurysm ([place])   . Macular degeneration   . Anxiety   . Hyperlipidemia   . Hypertension   . Osteoporosis   . Lumbago   . Depression   . Bursitis 2012    left hip, improved with periodic steroid injection [facility] [place])  . Anemia 2012    of acute blood loss, resolved  . GERD (gastroesophageal reflux disease)     with Barretts Esophagus  . COPD (chronic obstructive pulmonary disease) ([place])   . Arthritis    Past Surgical History:  Past Surgical History  Procedure Laterality Date  . Appendectomy    . Back surgery      X2..1975 arachnoid cyst cervical region(blumquist,gso);1997 lumbosacral tumor,9 hr surgery  . Abdominal aortic aneurysm repair  July 2010    [facility]   HPI:  Pt is a 81 y.o. female who presents with shortness of breath and cough with lethargy. Patient was recently hospitalized here, and states that for the past several days she has had increasing shortness of breath as well as productive cough. She is very lethargic, but is arousable and able to give accurate history of present illness. She called EMS from home given her symptoms, and was brought to the ED for evaluation. Here she was found to have an elevated white count, and she was tachypneic and tachycardic. She was also found to have an elevated creatinine as well as a significantly elevated troponin. Chest x-ray showed pneumonia. She was given antibiotics, culture sent, heparin drip started, and hospitalist called for admission. Pt does have a h/o Barrrett's Esophagus and endorses esophageal issues which can  greatly impact swallowing and clearing of the esophagus w/ risk for regurgitation and aspiration of Reflux material.Pt currently exhibits a hacking cough w/ exertion(increased respiratory effort). NSG and pt have noted this when not taking po's.     Assessment / Plan / Recommendation Clinical Impression  Pt appears to tolerate trials of thin liquids and soft solids/purees w/ no immediate, overt s/s of aspiration noted. Pt exhibited no immediate coughing or decline in respiratory status during/post po trials; clear vocal quality noted post trials. Pt fed self w/ setup assist. When she exerted herself w/ increased talking or moving about in the bed, she initiated the hacking cough responses which appeared to fatigue her and required her to take rest breaks. Any of pt's coughing did not appear directly related to po's. However, pt is at increased risk for aspiration sec. to mis-timing of the pharyngeal swallowing during coughing and when SOB. Discussed strict aspiration precautions w/ pt; pt agreed. Rec. Meds in Puree (whole) for easier swallowing at this time. Of note, pt is at increased risk for aspiration of regurgitation/Reflux d/t Esophageal dysmotility sec. to the Barrett's Esophagus. Rec. strict Reflux precautions as well.     Aspiration Risk  Mild aspiration risk (Barrett's Esophagus; hiatal hernia)    Diet Recommendation  Mech soft(cut, moist meats); thin liquids; strict aspiration and reflux precautions - rest breaks at meals.  Dietician consult for nutritional supplement as indicated. Rec. GI f/u for further tx/education re: Barrett's Esophagus.  Medication Administration: Whole meds with puree    Other  Recommendations Recommended Consults: Consider GI evaluation (f/u tx; education) Oral Care Recommendations: Oral care BID;Patient independent with oral care (setup assist)   Follow up Recommendations   (TBD)    Frequency and Duration min 2x/week  1 week       Prognosis Prognosis for  Safe Diet Advancement: Fair Barriers to Reach Goals: Severity of deficits (GI)      Swallow Study   General Date of Onset: 04/14/15 HPI: Pt is a 81 y.o. female who presents with shortness of breath and cough with lethargy. Patient was recently hospitalized here, and states that for the past several days she has had increasing shortness of breath as well as productive cough. She is very lethargic, but is arousable and able to give accurate history of present illness. She called EMS from home given her symptoms, and was brought to the ED for evaluation. Here she was found to have an elevated white count, and she was tachypneic and tachycardic. She was also found to have an elevated creatinine as well as a significantly elevated troponin. Chest x-ray showed pneumonia. She was given antibiotics, culture sent, heparin drip started, and hospitalist called for admission. Pt does have a h/o Barrrett's Esophagus and endorses esophageal issues which can greatly impact swallowing and clearing of the esophagus w/ risk for regurgitation and aspiration of Reflux material.Pt currently exhibits a hacking cough w/ exertion(increased respiratory effort). NSG and pt have noted this when not taking po's.   Type of Study: Bedside Swallow Evaluation Previous Swallow Assessment: none Diet Prior to this Study: Regular;Thin liquids Temperature Spikes Noted:  (101.8 temp and 12.0 wbc at admission; decreased now) Respiratory Status: Nasal cannula (2 liters) History of Recent Intubation: No Behavior/Cognition: Alert;Cooperative;Pleasant mood (labile re: family issues) Oral Cavity Assessment: Within Functional Limits Oral Care Completed by SLP: Recent completion by staff Oral Cavity - Dentition: Dentures, top (bottom partial) Vision: Functional for self-feeding Self-Feeding Abilities: Able to feed self;Needs set up Patient Positioning: Upright in bed Baseline Vocal Quality: Normal;Low vocal intensity Volitional Cough:  Strong;Congested Volitional Swallow: Able to elicit    Oral/Motor/Sensory Function Overall Oral Motor/Sensory Function: Within functional limits   Ice Chips Ice chips: Within functional limits Presentation: Spoon;Self Fed (2 trials)   Thin Liquid Thin Liquid: Within functional limits Presentation: Cup;Self Fed;Straw (~3-4 ozs total)    Nectar Thick Nectar Thick Liquid: Not tested   Honey Thick Honey Thick Liquid: Not tested   Puree Puree: Within functional limits Presentation: Spoon;Self Fed (6 trials)   Solid Solid: Within functional limits Presentation: Self Fed (3 trials)      Orinda Kenner, MS, CCC-SLP  Xiomar Crompton 04/15/2015,1:57 PM

## 2015-04-15 NOTE — Telephone Encounter (Signed)
FYI

## 2015-04-15 NOTE — Care Management (Signed)
Patient admitted from home with sepsis.  Patient states that she lives with her 2 adult daughters, both of which have medical issues.  Patient obtains her medications at [facility] in [place].  Patient states that's she has a walker, WC, BSC, and shower stool at home.  Patient says up until last week she did not have a need for the equipment.  Patient is acutely requiring O2 and will need qualifying O2 sats at time of discharge if required.  Daughter drives patient when needed.  Patient states that she has recently had home health PT, but can not remember the name of the company.  Did not see previous documentation of where this was arranged.  RNCM following for discharge planning

## 2015-04-15 NOTE — H&P (Signed)
[place] [facility] [place] NAME: Anna Prince    MR#:  [id number]  DATE OF BIRTH:  [birth year]  DATE OF ADMISSION:  04/14/2015  PRIMARY CARE PHYSICIAN: Crecencio Mc, MD   REQUESTING/REFERRING PHYSICIAN: Joni Fears, M.D.  CHIEF COMPLAINT:   Chief Complaint  Patient presents with  . Shortness of Breath    ems pt from home called out for shortness of breath and cough, pt recently dx with pneumonia.     HISTORY OF PRESENT ILLNESS:  Anna Prince  is a 81 y.o. female who presents with shortness of breath and cough with lethargy. Patient was recently hospitalized here, and states that for the past several days she has had increasing shortness of breath as well as productive cough. She is very lethargic, but is arousable and able to give accurate history of present illness. She called EMS from home given her symptoms, and was brought to the ED for evaluation. Here she was found to have an elevated white count, and she was tachypneic and tachycardic. She was also found to have an elevated creatinine as well as a significantly elevated troponin. Chest x-ray showed pneumonia. She was given antibiotics, culture sent, heparin drip started, and hospitalist called for admission.  PAST MEDICAL HISTORY:   Past Medical History  Diagnosis Date  . Barrett's esophagus 2007  . Sigmoid diverticulosis     by colonoscopy  . Aortic aneurysm ([place])   . Macular degeneration   . Anxiety   . Hyperlipidemia   . Hypertension   . Osteoporosis   . Lumbago   . Depression   . Bursitis 2012    left hip, improved with periodic steroid injection [facility] [place])  . Anemia 2012    of acute blood loss, resolved  . GERD (gastroesophageal reflux disease)     with Barretts Esophagus  . COPD (chronic obstructive pulmonary disease) ([place])   . Arthritis     PAST SURGICAL HISTORY:   Past Surgical History  Procedure Laterality Date  . Appendectomy    . Back  surgery      X2..1975 arachnoid cyst cervical region(blumquist,gso);1997 lumbosacral tumor,9 hr surgery  . Abdominal aortic aneurysm repair  July 2010    [facility]    SOCIAL HISTORY:   Social History  Substance Use Topics  . Smoking status: Current Every Day Smoker -- 0.50 packs/day    Types: Cigarettes  . Smokeless tobacco: Never Used     Comment: HAS BEEN SMOKING 30+ YEARS,RESUMED TOBACCO USE AFTER AAA REPAIR  . Alcohol Use: No    FAMILY HISTORY:   Family History  Problem Relation Age of Onset  . Coronary artery disease Mother   . Coronary artery disease Father 34  . Cancer Neg Hx     no breast,coon or ovarian ca    DRUG ALLERGIES:   Allergies  Allergen Reactions  . Morphine Sulfate Other (See Comments)    ANALGESICS-OPIOID Hallucinations    MEDICATIONS AT HOME:   Prior to Admission medications   Medication Sig Start Date End Date Taking? Authorizing Provider  diazepam (VALIUM) 5 MG tablet TAKE 1 TABLET BY MOUTH TWICE DAILY AS NEEDED 04/06/15  Yes Crecencio Mc, MD  HYDROcodone-acetaminophen (NORCO/VICODIN) 5-325 MG tablet TAKE 1 TABLET BY MOUTH EVERY 6 HOURS as needed for back and hip pain 04/07/15  Yes Crecencio Mc, MD  lisinopril (PRINIVIL,ZESTRIL) 20 MG tablet TAKE 1 TABLET BY MOUTH EVERY DAY 09/18/14  Yes Crecencio Mc,  MD  lovastatin (MEVACOR) 40 MG tablet TAKE 1 TABLET BY MOUTH EVERY DAY 03/14/15  Yes Crecencio Mc, MD  meloxicam (MOBIC) 15 MG tablet Take 1 tablet (15 mg total) by mouth daily. 08/25/14  Yes Crecencio Mc, MD  mirtazapine (REMERON) 30 MG tablet TAKE 1 TABLET(30 MG) BY MOUTH AT BEDTIME 02/14/15  Yes Crecencio Mc, MD  omeprazole (PRILOSEC) 40 MG capsule Take 1 capsule (40 mg total) by mouth daily. 05/31/14  Yes Crecencio Mc, MD  PARoxetine (PAXIL) 20 MG tablet TAKE 1 TABLET(20 MG) BY MOUTH DAILY 02/14/15  Yes Crecencio Mc, MD  albuterol (PROVENTIL HFA;VENTOLIN HFA) 108 (90 BASE) MCG/ACT inhaler Inhale 2 puffs into the lungs every 6 (six) hours  as needed for wheezing. 09/01/14   Crecencio Mc, MD    REVIEW OF SYSTEMS:  Review of Systems  Constitutional: Positive for chills and malaise/fatigue. Negative for fever and weight loss.  HENT: Negative for ear pain, hearing loss and tinnitus.   Eyes: Negative for blurred vision, double vision, pain and redness.  Respiratory: Positive for cough and shortness of breath. Negative for hemoptysis.   Cardiovascular: Negative for chest pain, palpitations, orthopnea and leg swelling.  Gastrointestinal: Negative for nausea, vomiting, abdominal pain, diarrhea and constipation.  Genitourinary: Negative for dysuria, frequency and hematuria.  Musculoskeletal: Negative for back pain, joint pain and neck pain.  Skin:       No acne, rash, or lesions  Neurological: Negative for dizziness, tremors, focal weakness and weakness.  Endo/Heme/Allergies: Negative for polydipsia. Does not bruise/bleed easily.  Psychiatric/Behavioral: Negative for depression. The patient is not nervous/anxious and does not have insomnia.      VITAL SIGNS:   Filed Vitals:   04/14/15 2300 04/14/15 2330 04/14/15 2333 04/14/15 2356  BP: 96/80 96/50 82/51  94/68  Pulse: 102 71 101 93  Temp:      TempSrc:      Resp: 18 29 15 30   Height:      Weight:      SpO2: 95% 92% 95% 98%   Wt Readings from Last 3 Encounters:  04/14/15 62.596 kg (138 lb)  04/13/15 62.937 kg (138 lb 12 oz)  04/10/15 65.182 kg (143 lb 11.2 oz)    PHYSICAL EXAMINATION:  Physical Exam  Vitals reviewed. Constitutional: She is oriented to person, place, and time. She appears well-developed and well-nourished. No distress.  HENT:  Head: Normocephalic and atraumatic.  Mouth/Throat: Oropharynx is clear and moist.  Eyes: Conjunctivae and EOM are normal. Pupils are equal, round, and reactive to light. No scleral icterus.  Neck: Normal range of motion. Neck supple. No JVD present. No thyromegaly present.  Cardiovascular: Normal rate, regular rhythm and  intact distal pulses.  Exam reveals no gallop and no friction rub.   No murmur heard. Respiratory: Effort normal. No respiratory distress. She has wheezes. She has no rales.  Coarse breath sounds  GI: Soft. Bowel sounds are normal. She exhibits no distension. There is no tenderness.  Musculoskeletal: Normal range of motion. She exhibits no edema.  No arthritis, no gout  Lymphadenopathy:    She has no cervical adenopathy.  Neurological: She is alert and oriented to person, place, and time. No cranial nerve deficit.  No dysarthria, no aphasia  Skin: Skin is warm and dry. No rash noted. No erythema.  Psychiatric: She has a normal mood and affect. Her behavior is normal. Judgment and thought content normal.    LABORATORY PANEL:   CBC  Recent Labs  Lab 04/14/15 2127  WBC 12.0*  HGB 10.8*  HCT 32.8*  PLT 154   ------------------------------------------------------------------------------------------------------------------  Chemistries   Recent Labs Lab 04/14/15 2127  NA 137  K 3.5  CL 104  CO2 24  GLUCOSE 145*  BUN 28*  CREATININE 1.31*  CALCIUM 8.5*  AST 39  ALT 29  ALKPHOS 99  BILITOT 0.6   ------------------------------------------------------------------------------------------------------------------  Cardiac Enzymes  Recent Labs Lab 04/14/15 2127  TROPONINI 1.09*   ------------------------------------------------------------------------------------------------------------------  RADIOLOGY:  Dg Chest Port 1 View  04/14/2015  CLINICAL DATA:  Shortness of breath and cough. EXAM: PORTABLE CHEST 1 VIEW COMPARISON:  04/09/2015 FINDINGS: Cardiomediastinal silhouette is normal. Mediastinal contours appear intact. The aorta is torturous and contains atherosclerotic calcifications. There is significant increase of airspace opacity in the right cardio phrenic angle, consistent with infectious consolidation, given the rapid onset of findings. No evidence of pleural  effusion or pneumothorax. There is background of chronic hyperinflation of the lungs. Osseous structures are without acute abnormality. Soft tissues are grossly normal. IMPRESSION: Right lower lobe airspace consolidation which likely represents pneumonia. Aspiration pneumonia is a differential consideration. Electronically Signed   By: Fidela Salisbury M.D.   On: 04/14/2015 21:39    EKG:   Orders placed or performed during the hospital encounter of 04/14/15  . EKG 12-Lead  . EKG 12-Lead    IMPRESSION AND PLAN:  Principal Problem:   Severe sepsis ([place]) - antibiotics given in the ED, continue on admission. Blood cultures sent. Sputum culture ordered. Lactic acid pending. Patient is hemodynamically stable. Active Problems:   HCAP (healthcare-associated pneumonia) - antibiotics and cultures as above.   NSTEMI (non-ST elevated myocardial infarction) (HCC) - troponin at 1.09. Heparin drip started. Serially trend cardiac enzymes. Cardiology consult ordered. We'll fluid resuscitate tonight. Patient needs to get her heart rate down, but given her tenuous blood pressure we cannot beta blocker at this time.   COPD (chronic obstructive pulmonary disease) ([place]) - continue home inhalers, and levalbuterol nebs when necessary here.   AKI (acute kidney injury) ([place]) - likely to improve with fluids. Monitor closely   HTN (hypertension) - blood pressure on the low side here. Hold antihypertensives for now.   Barrett's esophagus - PPI at home dose   Anxiety - continue home dose anxiolytics  All the records are reviewed and case discussed with ED provider. Management plans discussed with the patient and/or family.  DVT PROPHYLAXIS: systemic anticoagulation  GI PROPHYLAXIS: PPI  ADMISSION STATUS: Inpatient  CODE STATUS: Full  TOTAL TIME TAKING CARE OF THIS PATIENT: 50 minutes.    Amogh Komatsu FIELDING 04/15/2015, 12:36 AM  [facility]  Office  [phone]  CC: Primary care  physician; Crecencio Mc, MD

## 2015-04-15 NOTE — Progress Notes (Signed)
ANTIBIOTIC CONSULT NOTE -FOLLOW UP   Pharmacy Consult for vancomycin/cefepime Indication: pneumonia  Allergies  Allergen Reactions  . Morphine Sulfate Other (See Comments)    ANALGESICS-OPIOID Hallucinations    Patient Measurements: Height: 5\' 6"  (167.6 cm) Weight: 138 lb (62.596 kg) IBW/kg (Calculated) : 59.3 Adjusted Body Weight: 62 kg  Vital Signs: Temp: 97.6 F (36.4 C) (12/23 0502) Temp Source: Oral (12/23 0502) BP: 116/56 mmHg (12/23 0502) Pulse Rate: 38 (12/23 0502) Intake/Output from previous day:   Intake/Output from this shift:    Labs:  Recent Labs  04/14/15 2127 04/15/15 0751  WBC 12.0*  --   HGB 10.8*  --   PLT 154  --   CREATININE 1.31* 1.29*   Estimated Creatinine Clearance: 32 mL/min (by C-G formula based on Cr of 1.29). No results for input(s): VANCOTROUGH, VANCOPEAK, VANCORANDOM, GENTTROUGH, GENTPEAK, GENTRANDOM, TOBRATROUGH, TOBRAPEAK, TOBRARND, AMIKACINPEAK, AMIKACINTROU, AMIKACIN in the last 72 hours.   Microbiology: Recent Results (from the past 720 hour(s))  Gastrointestinal Panel by PCR , Stool     Status: None   Collection Time: 04/08/15 12:20 PM  Result Value Ref Range Status   Campylobacter species NOT DETECTED NOT DETECTED Final   Plesimonas shigelloides NOT DETECTED NOT DETECTED Final   Salmonella species NOT DETECTED NOT DETECTED Final   Yersinia enterocolitica NOT DETECTED NOT DETECTED Final   Vibrio species NOT DETECTED NOT DETECTED Final   Vibrio cholerae NOT DETECTED NOT DETECTED Final   Enteroaggregative E coli (EAEC) NOT DETECTED NOT DETECTED Final   Enteropathogenic E coli (EPEC) NOT DETECTED NOT DETECTED Final   Enterotoxigenic E coli (ETEC) NOT DETECTED NOT DETECTED Final   Shiga like toxin producing E coli (STEC) NOT DETECTED NOT DETECTED Final   E. coli O157 NOT DETECTED NOT DETECTED Final   Shigella/Enteroinvasive E coli (EIEC) NOT DETECTED NOT DETECTED Final   Cryptosporidium NOT DETECTED NOT DETECTED Final   Cyclospora cayetanensis NOT DETECTED NOT DETECTED Final   Entamoeba histolytica NOT DETECTED NOT DETECTED Final   Giardia lamblia NOT DETECTED NOT DETECTED Final   Adenovirus F40/41 NOT DETECTED NOT DETECTED Final   Astrovirus NOT DETECTED NOT DETECTED Final   Norovirus GI/GII NOT DETECTED NOT DETECTED Final   Rotavirus A NOT DETECTED NOT DETECTED Final   Sapovirus (I, II, IV, and V) NOT DETECTED NOT DETECTED Final  C difficile quick scan w PCR reflex     Status: None   Collection Time: 04/08/15 12:20 PM  Result Value Ref Range Status   C Diff antigen NEGATIVE NEGATIVE Final   C Diff toxin NEGATIVE NEGATIVE Final   C Diff interpretation Negative for C. difficile  Final  Blood Culture (routine x 2)     Status: None   Collection Time: 04/09/15  1:31 AM  Result Value Ref Range Status   Specimen Description BLOOD RIGHT ASSIST CONTROL  Final   Special Requests BOTTLES DRAWN AEROBIC AND ANAEROBIC 4CC  Final   Culture NO GROWTH 6 DAYS  Final   Report Status 04/15/2015 FINAL  Final  Urine culture     Status: None   Collection Time: 04/09/15  1:31 AM  Result Value Ref Range Status   Specimen Description URINE, RANDOM  Final   Special Requests NONE  Final   Culture NO GROWTH 2 DAYS  Final   Report Status 04/11/2015 FINAL  Final  Blood Culture (routine x 2)     Status: None   Collection Time: 04/09/15  1:46 AM  Result Value Ref Range Status  Specimen Description BLOOD LEFT ASSIST CONTROL  Final   Special Requests BOTTLES DRAWN AEROBIC AND ANAEROBIC 4CC  Final   Culture NO GROWTH 6 DAYS  Final   Report Status 04/15/2015 FINAL  Final  Blood Culture (routine x 2)     Status: None (Preliminary result)   Collection Time: 04/14/15  9:27 PM  Result Value Ref Range Status   Specimen Description BLOOD BLOOD RIGHT FOREARM  Final   Special Requests BOTTLES DRAWN AEROBIC AND ANAEROBIC 5ML  Final   Culture NO GROWTH < 12 HOURS  Final   Report Status PENDING  Incomplete    Medical History: Past  Medical History  Diagnosis Date  . Barrett's esophagus 2007  . Sigmoid diverticulosis     by colonoscopy  . Aortic aneurysm ([place])   . Macular degeneration   . Anxiety   . Hyperlipidemia   . Hypertension   . Osteoporosis   . Lumbago   . Depression   . Bursitis 2012    left hip, improved with periodic steroid injection [facility] [place])  . Anemia 2012    of acute blood loss, resolved  . GERD (gastroesophageal reflux disease)     with Barretts Esophagus  . COPD (chronic obstructive pulmonary disease) ([place])   . Arthritis     Medications:  Infusions:  . sodium chloride 100 mL/hr at 04/15/15 0202  . heparin 750 Units/hr (04/14/15 2326)   Assessment: 83 yof cc SOB/cough. Recently hospitalized 5 days ago for abdominal pain/diarrhea, seemed to be viral in nature. ED finds fever, tachycardia, hypoxia, starting vanc/cefepime for HCAP.   Goal of Therapy:  Vancomycin trough level 15-20 mcg/ml  Plan:  Expected duration 7 days with resolution of temperature and/or normalization of WBC.   1) Cefepime: Continue 2 gm IV Q12H  2) Vancomycin: Continue Vancomycin 750 mg IV q24 hours  Pharmacy will continue to monitor and adjust as needed to maintain trough 15 to 20 mcg/mL.   Will recommend de-escalation of therapy upon evaluation of culture results.   Nathanal Hermiz D, Pharm.D., BCPS Clinical Pharmacist 04/15/2015,8:30 AM

## 2015-04-15 NOTE — Progress Notes (Signed)
Anna Prince at [place] NAME: Anna Prince    MR#:  [id number]  DATE OF BIRTH:  [birth year]  SUBJECTIVE:   Pt. here due to shortness of breath and noted to have pneumonia. Continues to have a cough which is nonproductive. Afebrile, hemodynamically stable. Noted to have elevated troponin but did not have any acute chest pain.  REVIEW OF SYSTEMS:    Review of Systems  Constitutional: Negative for fever and chills.  HENT: Negative for congestion and tinnitus.   Eyes: Negative for blurred vision and double vision.  Respiratory: Positive for cough, shortness of breath and wheezing.   Cardiovascular: Negative for chest pain, orthopnea and PND.  Gastrointestinal: Negative for nausea, vomiting, abdominal pain and diarrhea.  Genitourinary: Negative for dysuria and hematuria.  Neurological: Negative for dizziness, sensory change and focal weakness.  All other systems reviewed and are negative.   Nutrition: Heart Healthy Tolerating Diet: Yes Tolerating PT: Await Eval.   DRUG ALLERGIES:   Allergies  Allergen Reactions  . Morphine Sulfate Other (See Comments)    ANALGESICS-OPIOID Hallucinations    VITALS:  Blood pressure 116/69, pulse 90, temperature 98.1 F (36.7 C), temperature source Oral, resp. rate 22, height 5\' 6"  (1.676 m), weight 62.596 kg (138 lb), SpO2 95 %.  PHYSICAL EXAMINATION:   Physical Exam  GENERAL:  81 y.o.-year-old patient lying in the bed with no acute distress.  EYES: Pupils equal, round, reactive to light and accommodation. No scleral icterus. Extraocular muscles intact.  HEENT: Head atraumatic, normocephalic. Oropharynx and nasopharynx clear.  NECK:  Supple, no jugular venous distention. No thyroid enlargement, no tenderness.  LUNGS: Prolonged inspiratory and expiratory phase, expiratory wheezing bilaterally, no rales, rhonchi. No use of accessory muscles of respiration.  CARDIOVASCULAR: S1, S2 normal. No  murmurs, rubs, or gallops.  ABDOMEN: Soft, nontender, nondistended. Bowel sounds present. No organomegaly or mass.  EXTREMITIES: No cyanosis, clubbing or edema b/l.    NEUROLOGIC: Cranial nerves II through XII are intact. No focal Motor or sensory deficits b/l.   PSYCHIATRIC: The patient is alert and oriented x 3. Good affect SKIN: No obvious rash, lesion, or ulcer.    LABORATORY PANEL:   CBC  Recent Labs Lab 04/15/15 0751  WBC 9.2  HGB 11.0*  HCT 32.8*  PLT 162   ------------------------------------------------------------------------------------------------------------------  Chemistries   Recent Labs Lab 04/14/15 2127 04/15/15 0751  NA 137 141  K 3.5 3.8  CL 104 113*  CO2 24 23  GLUCOSE 145* 125*  BUN 28* 30*  CREATININE 1.31* 1.29*  CALCIUM 8.5* 7.7*  AST 39  --   ALT 29  --   ALKPHOS 99  --   BILITOT 0.6  --    ------------------------------------------------------------------------------------------------------------------  Cardiac Enzymes  Recent Labs Lab 04/15/15 0751  TROPONINI 0.67*   ------------------------------------------------------------------------------------------------------------------  RADIOLOGY:  Dg Chest Port 1 View  04/14/2015  CLINICAL DATA:  Shortness of breath and cough. EXAM: PORTABLE CHEST 1 VIEW COMPARISON:  04/09/2015 FINDINGS: Cardiomediastinal silhouette is normal. Mediastinal contours appear intact. The aorta is torturous and contains atherosclerotic calcifications. There is significant increase of airspace opacity in the right cardio phrenic angle, consistent with infectious consolidation, given the rapid onset of findings. No evidence of pleural effusion or pneumothorax. There is background of chronic hyperinflation of the lungs. Osseous structures are without acute abnormality. Soft tissues are grossly normal. IMPRESSION: Right lower lobe airspace consolidation which likely represents pneumonia. Aspiration pneumonia is a  differential consideration. Electronically Signed  By: Fidela Salisbury M.D.   On: 04/14/2015 21:39     ASSESSMENT AND PLAN:   81 year old female with past medical history of COPD, history of Barrett's esophagus, macular degeneration, anxiety, hypertension, hyperlipidemia, osteoporosis, depression, chronic anemia, GERD who presented to the hospital due to shortness of breath and noted to have a pneumonia.  #1 acute respiratory failure with hypoxia-this is due to pneumonia seen on the chest x-ray admission. -Patient was recently hospitalized for a viral GI illness therefore some suspicion for nosocomial pneumonia. -Continue IV vancomycin, cefepime and follow clinically. We O2 as tolerated.  #2 pneumonia-likely nosocomial pneumonia. -Continue vancomycin, cefepime. Follow blood, sputum cultures.  #3 COPD-no acute exacerbation. Continue Xopenex nebulizer, Pulmicort nebs.  #4 anxiety-continue Valium, Paxil.  #5 elevated troponin-likely in the setting of demand ischemia from hypoxia. -Await cardiology input.  two-dimensional echocardiogram results pending. - no acute chest pain.    All the records are reviewed and case discussed with Care Management/Social Workerr. Management plans discussed with the patient, family and they are in agreement.  CODE STATUS: Full  DVT Prophylaxis: Lovenox  TOTAL TIME TAKING CARE OF THIS PATIENT: 30 minutes.   POSSIBLE D/C IN 1-2 DAYS, DEPENDING ON CLINICAL CONDITION.   Henreitta Leber M.D on 04/15/2015 at 3:33 PM  Between 7am to 6pm - Pager - [phone]  After 6pm go to [email or web address] - password [email or web address] [facility]  Office  [phone]  CC: Primary care physician; Crecencio Mc, MD

## 2015-04-15 NOTE — Telephone Encounter (Signed)
Pt daughter called about pt being admitted into the hospital. Diagnosis is Pneumonia. Pt is in [employer]. Thank You!

## 2015-04-15 NOTE — Progress Notes (Addendum)
ANTICOAGULATION CONSULT NOTE - FOLLOW UP   Pharmacy Consult for heparin Indication: chest pain/ACS  Allergies  Allergen Reactions  . Morphine Sulfate Other (See Comments)    ANALGESICS-OPIOID Hallucinations    Patient Measurements: Height: 5\' 6"  (167.6 cm) Weight: 138 lb (62.596 kg) IBW/kg (Calculated) : 59.3 Heparin Dosing Weight: 62 kg  Vital Signs: Temp: 97.6 F (36.4 C) (12/23 0502) Temp Source: Oral (12/23 0502) BP: 116/56 mmHg (12/23 0502) Pulse Rate: 38 (12/23 0502)  Labs:  Recent Labs  04/14/15 2127 04/15/15 0350 04/15/15 0751  HGB 10.8*  --   --   HCT 32.8*  --   --   PLT 154  --   --   APTT 31  --   --   LABPROT 13.8  --   --   INR 1.04  --   --   HEPARINUNFRC  --   --  0.34  CREATININE 1.31*  --  1.29*  TROPONINI 1.09* 0.86* 0.67*    Estimated Creatinine Clearance: 32 mL/min (by C-G formula based on Cr of 1.29).   Medical History: Past Medical History  Diagnosis Date  . Barrett's esophagus 2007  . Sigmoid diverticulosis     by colonoscopy  . Aortic aneurysm ([place])   . Macular degeneration   . Anxiety   . Hyperlipidemia   . Hypertension   . Osteoporosis   . Lumbago   . Depression   . Bursitis 2012    left hip, improved with periodic steroid injection [facility] [place])  . Anemia 2012    of acute blood loss, resolved  . GERD (gastroesophageal reflux disease)     with Barretts Esophagus  . COPD (chronic obstructive pulmonary disease) ([place])   . Arthritis     Medications:  Infusions:  . sodium chloride 100 mL/hr at 04/15/15 0202  . heparin 750 Units/hr (04/14/15 2326)    Assessment: 83 yof cc SOB starting therapy in ED for PNA. Troponin 1.09 but symptoms not consistent with ACS per provider note. Starting heparin infusion and will monitor serial troponin.    Goal of Therapy:  Heparin level 0.3-0.7 units/ml Monitor platelets by anticoagulation protocol: Yes   Plan:  Heparin level resulted 0.34. Will continue heparin rate of 750  units/hr. Will order another heparin level to be drawn @ 1600. Pharmacy to follow.   Nakiea Metzner D, Pharm.D., BCPS Clinical Pharmacist 04/15/2015,8:54 AM

## 2015-04-15 NOTE — Progress Notes (Signed)
Anna Prince is a 81 y.o. female patient admitted from ED drousy, responds to voice and falls quickly back to sleep- oriented  X 4 - no acute distress noted.  VSS - Blood pressure 113/66, pulse 79, temperature 97.7 F (36.5 C), temperature source Oral, resp. rate 21, height 5\' 6"  (1.676 m), weight 62.596 kg (138 lb), SpO2 95 %.    IV in place, occlusive dsg intact without redness.  Orientation to room, and floor completed with information packet given to patient/family. Admission INP armband ID verified with patient/family, and in place.   SR up x 2, fall assessment complete, with patient and family able to verbalize understanding of risk associated with falls, and verbalized understanding to call nsg before up out of bed.  Call light within reach, patient able to voice, and demonstrate understanding.  Skin assessed with Doretha Imus., RN.  Will cont to eval and treat per MD orders.  Rachael Fee, RN

## 2015-04-15 NOTE — Progress Notes (Signed)
Visited with patient and daughters.  Gave compassionate support and prayer. [place] 1200

## 2015-04-15 NOTE — Care Management (Signed)
Patient had recent observation stay at [facility] for gi symptoms.  She is admitted with shortness of breath and has ruled in for pneumonia and nstemi.  She is currently requiring 2 liters 02 which is acute.  She is on IV antibiotics and SLP consult is pending

## 2015-04-16 DIAGNOSIS — M6281 Muscle weakness (generalized): Secondary | ICD-10-CM | POA: Insufficient documentation

## 2015-04-16 DIAGNOSIS — Z09 Encounter for follow-up examination after completed treatment for conditions other than malignant neoplasm: Secondary | ICD-10-CM | POA: Insufficient documentation

## 2015-04-16 LAB — BASIC METABOLIC PANEL
ANION GAP: 8 (ref 5–15)
BUN: 29 mg/dL — ABNORMAL HIGH (ref 6–20)
CALCIUM: 8.6 mg/dL — AB (ref 8.9–10.3)
CO2: 23 mmol/L (ref 22–32)
Chloride: 109 mmol/L (ref 101–111)
Creatinine, Ser: 1.05 mg/dL — ABNORMAL HIGH (ref 0.44–1.00)
GFR calc Af Amer: 56 mL/min — ABNORMAL LOW (ref >60)
GFR, EST NON AFRICAN AMERICAN: 48 mL/min — AB (ref >60)
Glucose, Bld: 109 mg/dL — ABNORMAL HIGH (ref 65–99)
POTASSIUM: 3.5 mmol/L (ref 3.5–5.1)
SODIUM: 140 mmol/L (ref 135–145)

## 2015-04-16 MED ORDER — ENSURE ENLIVE PO LIQD
237.0000 mL | Freq: Three times a day (TID) | ORAL | Status: DC
  Administered 2015-04-16 – 2015-04-18 (×5): 237 mL via ORAL

## 2015-04-16 NOTE — Progress Notes (Signed)
Pt. Requested a breathing treatment, respiratory called and treatment requested.  Sats 94% on 2LNC, RR20, No SOB or acute distress noted. Pt. Also given quafenesin for her cough. Diazepam given to help her relax. Will continue to monitor pt.

## 2015-04-16 NOTE — Assessment & Plan Note (Signed)
undertreated secondary to cost of medications, and aggravated by continued tobacco abuse.  Depo Medrol was given today.

## 2015-04-16 NOTE — Assessment & Plan Note (Signed)
Medications were suspended due to hypotension and wre resumed at discharge without change.

## 2015-04-16 NOTE — Progress Notes (Signed)
[facility] [place] NAME: Shada Sholtz    MR#:  [id number]  DATE OF BIRTH:  February 19, 1932  SUBJECTIVE:   Pt. here due to shortness of breath and noted to have pneumonia. Chest pain. Feels weak. Does not have an appetite.  REVIEW OF SYSTEMS:    Review of Systems  Constitutional: Negative for fever and chills.  HENT: Negative for congestion and tinnitus.   Eyes: Negative for blurred vision and double vision.  Respiratory: Positive for cough and shortness of breath. Negative for wheezing.   Cardiovascular: Negative for chest pain, orthopnea and PND.  Gastrointestinal: Negative for nausea, vomiting, abdominal pain and diarrhea.  Genitourinary: Negative for dysuria and hematuria.  Neurological: Negative for dizziness, sensory change and focal weakness.  All other systems reviewed and are negative.   Nutrition: Heart Healthy Tolerating Diet: Yes Tolerating PT: Await Eval.   DRUG ALLERGIES:   Allergies  Allergen Reactions  . Morphine Sulfate Other (See Comments)    ANALGESICS-OPIOID Hallucinations    VITALS:  Blood pressure 95/53, pulse 95, temperature 98.6 F (37 C), temperature source Oral, resp. rate 18, height 5\' 6"  (1.676 m), weight 62.596 kg (138 lb), SpO2 96 %.  PHYSICAL EXAMINATION:   Physical Exam  GENERAL:  81 y.o.-year-old patient lying in the bed with no acute distress.  EYES: Pupils equal, round, reactive to light and accommodation. No scleral icterus. Extraocular muscles intact.  HEENT: Head atraumatic, normocephalic. Oropharynx and nasopharynx clear.  NECK:  Supple, no jugular venous distention. No thyroid enlargement, no tenderness.  LUNGS: Prolonged inspiratory and expiratory phase, end-expiratory wheezing bilaterally, no rales, rhonchi. No use of accessory muscles of respiration.  CARDIOVASCULAR: S1, S2 normal. No murmurs, rubs, or gallops.  ABDOMEN: Soft, nontender, nondistended. Bowel sounds present.  No organomegaly or mass.  EXTREMITIES: No cyanosis, clubbing or edema b/l.    NEUROLOGIC: Cranial nerves II through XII are intact. No focal Motor or sensory deficits b/l.   PSYCHIATRIC: The patient is alert and oriented x 3. Good affect SKIN: No obvious rash, lesion, or ulcer.    LABORATORY PANEL:   CBC  Recent Labs Lab 04/15/15 0751  WBC 9.2  HGB 11.0*  HCT 32.8*  PLT 162   ------------------------------------------------------------------------------------------------------------------  Chemistries   Recent Labs Lab 04/14/15 2127  04/16/15 0522  NA 137  < > 140  K 3.5  < > 3.5  CL 104  < > 109  CO2 24  < > 23  GLUCOSE 145*  < > 109*  BUN 28*  < > 29*  CREATININE 1.31*  < > 1.05*  CALCIUM 8.5*  < > 8.6*  AST 39  --   --   ALT 29  --   --   ALKPHOS 99  --   --   BILITOT 0.6  --   --   < > = values in this interval not displayed. ------------------------------------------------------------------------------------------------------------------  Cardiac Enzymes  Recent Labs Lab 04/15/15 0751  TROPONINI 0.67*   ------------------------------------------------------------------------------------------------------------------  RADIOLOGY:  Dg Chest Port 1 View  04/14/2015  CLINICAL DATA:  Shortness of breath and cough. EXAM: PORTABLE CHEST 1 VIEW COMPARISON:  04/09/2015 FINDINGS: Cardiomediastinal silhouette is normal. Mediastinal contours appear intact. The aorta is torturous and contains atherosclerotic calcifications. There is significant increase of airspace opacity in the right cardio phrenic angle, consistent with infectious consolidation, given the rapid onset of findings. No evidence of pleural effusion or pneumothorax. There is background of chronic hyperinflation of the  lungs. Osseous structures are without acute abnormality. Soft tissues are grossly normal. IMPRESSION: Right lower lobe airspace consolidation which likely represents pneumonia. Aspiration  pneumonia is a differential consideration. Electronically Signed   By: Fidela Salisbury M.D.   On: 04/14/2015 21:39     ASSESSMENT AND PLAN:   81 year old female with past medical history of COPD, history of Barrett's esophagus, macular degeneration, anxiety, hypertension, hyperlipidemia, osteoporosis, depression, chronic anemia, GERD who presented to the hospital due to shortness of breath and noted to have a pneumonia.  #1 acute respiratory failure with hypoxia-this is due to pneumonia seen on the chest x-ray admission. -Patient was recently hospitalized for a viral GI illness therefore some suspicion for nosocomial pneumonia. -Continue IV vancomycin, cefepime.  Wean O2 as tolerated.  #2 pneumonia-likely nosocomial pneumonia. -Continue vancomycin, cefepime. Follow blood, sputum cultures which are negative so far.  #3 COPD-no acute exacerbation. Continue Xopenex nebulizer, Pulmicort nebs.  #4 anxiety-continue Valium, Paxil.  #5 elevated troponin-likely in the setting of demand ischemia from hypoxia. - appreciate Cards input no acute intervention.  two-dimensional echocardiogram results pending. - no acute chest pain.  D/c tele.   Will get PT consult to assess mobility.    All the records are reviewed and case discussed with Care Management/Social Workerr. Management plans discussed with the patient, family and they are in agreement.  CODE STATUS: Full  DVT Prophylaxis: Lovenox  TOTAL TIME TAKING CARE OF THIS PATIENT: 25 minutes.   POSSIBLE D/C IN 1-2 DAYS, DEPENDING ON CLINICAL CONDITION.   Henreitta Leber M.D on 04/16/2015 at 1:34 PM  Between 7am to 6pm - Pager - [phone]  After 6pm go to [email or web address] - password [email or web address] [facility]  Office  [phone]  CC: Primary care physician; Crecencio Mc, MD

## 2015-04-16 NOTE — Assessment & Plan Note (Signed)
I have reviewed her diet and recommended that she increase her protein intake with use of nutritional supplements. Marland Kitchen

## 2015-04-16 NOTE — Assessment & Plan Note (Addendum)
Secondary to weight loss, dehydration from recent viral illness,  And chronic illness,  Home PT to be ordered. She was given a dose of IM B12

## 2015-04-16 NOTE — Assessment & Plan Note (Signed)
hospital follow up.  I have reviewed the records from the hospital admission in detail with patient today.  There was no evidence of bacterial sepsis or bacterial gastroenteritis.  PT will be ordered.

## 2015-04-17 LAB — URINE CULTURE: CULTURE: NO GROWTH

## 2015-04-17 MED ORDER — IPRATROPIUM-ALBUTEROL 0.5-2.5 (3) MG/3ML IN SOLN
3.0000 mL | RESPIRATORY_TRACT | Status: DC
  Administered 2015-04-17 – 2015-04-18 (×6): 3 mL via RESPIRATORY_TRACT
  Filled 2015-04-17 (×7): qty 3

## 2015-04-17 MED ORDER — PIPERACILLIN-TAZOBACTAM 3.375 G IVPB
3.3750 g | Freq: Three times a day (TID) | INTRAVENOUS | Status: DC
  Administered 2015-04-17 – 2015-04-18 (×2): 3.375 g via INTRAVENOUS
  Filled 2015-04-17 (×4): qty 50

## 2015-04-17 NOTE — Progress Notes (Signed)
[facility] [place] NAME: Anna Prince    MR#:  [id number]  DATE OF BIRTH:  04/30/1931  SUBJECTIVE:   Pt. here due to shortness of breath and noted to have pneumonia. Still has a cough but energy and appetite has improved.  Having some wheezing now.   REVIEW OF SYSTEMS:    Review of Systems  Constitutional: Negative for fever and chills.  HENT: Negative for congestion and tinnitus.   Eyes: Negative for blurred vision and double vision.  Respiratory: Positive for cough and shortness of breath. Negative for wheezing.   Cardiovascular: Negative for chest pain, orthopnea and PND.  Gastrointestinal: Negative for nausea, vomiting, abdominal pain and diarrhea.  Genitourinary: Negative for dysuria and hematuria.  Neurological: Negative for dizziness, sensory change and focal weakness.  All other systems reviewed and are negative.   Nutrition: Heart Healthy Tolerating Diet: Yes Tolerating PT: Await Eval.   DRUG ALLERGIES:   Allergies  Allergen Reactions  . Morphine Sulfate Other (See Comments)    ANALGESICS-OPIOID Hallucinations    VITALS:  Blood pressure 118/74, pulse 76, temperature 98.5 F (36.9 C), temperature source Oral, resp. rate 20, height 5\' 6"  (1.676 m), weight 62.596 kg (138 lb), SpO2 97 %.  PHYSICAL EXAMINATION:   Physical Exam  GENERAL:  81 y.o.-year-old patient lying in the bed with no acute distress.  EYES: Pupils equal, round, reactive to light and accommodation. No scleral icterus. Extraocular muscles intact.  HEENT: Head atraumatic, normocephalic. Oropharynx and nasopharynx clear.  NECK:  Supple, no jugular venous distention. No thyroid enlargement, no tenderness.  LUNGS: Prolonged inspiratory and expiratory phase, end-expiratory wheezing bilaterally, no rales, rhonchi. No use of accessory muscles of respiration.  CARDIOVASCULAR: S1, S2 normal. No murmurs, rubs, or gallops.  ABDOMEN: Soft, nontender,  nondistended. Bowel sounds present. No organomegaly or mass.  EXTREMITIES: No cyanosis, clubbing or edema b/l.    NEUROLOGIC: Cranial nerves II through XII are intact. No focal Motor or sensory deficits b/l.   PSYCHIATRIC: The patient is alert and oriented x 3. Good affect SKIN: No obvious rash, lesion, or ulcer.    LABORATORY PANEL:   CBC  Recent Labs Lab 04/15/15 0751  WBC 9.2  HGB 11.0*  HCT 32.8*  PLT 162   ------------------------------------------------------------------------------------------------------------------  Chemistries   Recent Labs Lab 04/14/15 2127  04/16/15 0522  NA 137  < > 140  K 3.5  < > 3.5  CL 104  < > 109  CO2 24  < > 23  GLUCOSE 145*  < > 109*  BUN 28*  < > 29*  CREATININE 1.31*  < > 1.05*  CALCIUM 8.5*  < > 8.6*  AST 39  --   --   ALT 29  --   --   ALKPHOS 99  --   --   BILITOT 0.6  --   --   < > = values in this interval not displayed. ------------------------------------------------------------------------------------------------------------------  Cardiac Enzymes  Recent Labs Lab 04/15/15 0751  TROPONINI 0.67*   ------------------------------------------------------------------------------------------------------------------  RADIOLOGY:  No results found.   ASSESSMENT AND PLAN:   81 year old female with past medical history of COPD, history of Barrett's esophagus, macular degeneration, anxiety, hypertension, hyperlipidemia, osteoporosis, depression, chronic anemia, GERD who presented to the hospital due to shortness of breath and noted to have a pneumonia.  #1 acute respiratory failure with hypoxia-this is due to pneumonia seen on the chest x-ray admission. -Patient was recently hospitalized for a viral  GI illness therefore suspicion for nosocomial pneumonia. -Continue IV vancomycin, cefepime.  Wean O2 as tolerated.  #2 pneumonia-likely nosocomial pneumonia. -Continue vancomycin, cefepime. Cultures (-) so far.  -  afebrile, hemodynamically stable.   #3 COPD-no acute exacerbation. Wheezing a bit more today.  - will start duonebs ATC, Continue Xopenex nebulizer, Pulmicort nebs.  #4 anxiety-continue Valium, Paxil.  #5 elevated troponin-likely in the setting of demand ischemia from hypoxia. - appreciate Cards input no acute intervention.  Echo showing normal EF of 60-65% with no wall motion abnormalities.   - no acute chest pain.    Await PT eval.   All the records are reviewed and case discussed with Care Management/Social Workerr. Management plans discussed with the patient, family and they are in agreement.  CODE STATUS: Full  DVT Prophylaxis: Lovenox  TOTAL TIME TAKING CARE OF THIS PATIENT: 25 minutes.   POSSIBLE D/C IN 1-2 DAYS, DEPENDING ON CLINICAL CONDITION.   Henreitta Leber M.D on 04/17/2015 at 1:41 PM  Between 7am to 6pm - Pager - [phone]  After 6pm go to [email or web address] - password [email or web address] [facility]  Office  [phone]  CC: Primary care physician; Crecencio Mc, MD

## 2015-04-17 NOTE — Progress Notes (Signed)
ANTIBIOTIC CONSULT NOTE - INITIAL  Pharmacy Consult for Zosyn Indication: pneumonia  Allergies  Allergen Reactions  . Morphine Sulfate Other (See Comments)    ANALGESICS-OPIOID Hallucinations    Patient Measurements: Height: 5\' 6"  (167.6 cm) Weight: 138 lb (62.596 kg) IBW/kg (Calculated) : 59.3  Vital Signs: Temp: 98.5 F (36.9 C) (12/25 0519) Temp Source: Oral (12/25 0519) BP: 118/74 mmHg (12/25 0519) Pulse Rate: 76 (12/25 0519) Intake/Output from previous day: 12/24 0701 - 12/25 0700 In: 120 [P.O.:120] Out: 200 [Urine:200] Intake/Output from this shift: Total I/O In: 220 [P.O.:120; IV Piggyback:100] Out: -   Labs:  Recent Labs  04/14/15 2127 04/15/15 0751 04/16/15 0522  WBC 12.0* 9.2  --   HGB 10.8* 11.0*  --   PLT 154 162  --   CREATININE 1.31* 1.29* 1.05*   Estimated Creatinine Clearance: 39.3 mL/min (by C-G formula based on Cr of 1.05). No results for input(s): VANCOTROUGH, VANCOPEAK, VANCORANDOM, GENTTROUGH, GENTPEAK, GENTRANDOM, TOBRATROUGH, TOBRAPEAK, TOBRARND, AMIKACINPEAK, AMIKACINTROU, AMIKACIN in the last 72 hours.   Microbiology: Recent Results (from the past 720 hour(s))  Gastrointestinal Panel by PCR , Stool     Status: None   Collection Time: 04/08/15 12:20 PM  Result Value Ref Range Status   Campylobacter species NOT DETECTED NOT DETECTED Final   Plesimonas shigelloides NOT DETECTED NOT DETECTED Final   Salmonella species NOT DETECTED NOT DETECTED Final   Yersinia enterocolitica NOT DETECTED NOT DETECTED Final   Vibrio species NOT DETECTED NOT DETECTED Final   Vibrio cholerae NOT DETECTED NOT DETECTED Final   Enteroaggregative E coli (EAEC) NOT DETECTED NOT DETECTED Final   Enteropathogenic E coli (EPEC) NOT DETECTED NOT DETECTED Final   Enterotoxigenic E coli (ETEC) NOT DETECTED NOT DETECTED Final   Shiga like toxin producing E coli (STEC) NOT DETECTED NOT DETECTED Final   E. coli O157 NOT DETECTED NOT DETECTED Final   Shigella/Enteroinvasive E coli (EIEC) NOT DETECTED NOT DETECTED Final   Cryptosporidium NOT DETECTED NOT DETECTED Final   Cyclospora cayetanensis NOT DETECTED NOT DETECTED Final   Entamoeba histolytica NOT DETECTED NOT DETECTED Final   Giardia lamblia NOT DETECTED NOT DETECTED Final   Adenovirus F40/41 NOT DETECTED NOT DETECTED Final   Astrovirus NOT DETECTED NOT DETECTED Final   Norovirus GI/GII NOT DETECTED NOT DETECTED Final   Rotavirus A NOT DETECTED NOT DETECTED Final   Sapovirus (I, II, IV, and V) NOT DETECTED NOT DETECTED Final  C difficile quick scan w PCR reflex     Status: None   Collection Time: 04/08/15 12:20 PM  Result Value Ref Range Status   C Diff antigen NEGATIVE NEGATIVE Final   C Diff toxin NEGATIVE NEGATIVE Final   C Diff interpretation Negative for C. difficile  Final  Blood Culture (routine x 2)     Status: None   Collection Time: 04/09/15  1:31 AM  Result Value Ref Range Status   Specimen Description BLOOD RIGHT ASSIST CONTROL  Final   Special Requests BOTTLES DRAWN AEROBIC AND ANAEROBIC 4CC  Final   Culture NO GROWTH 6 DAYS  Final   Report Status 04/15/2015 FINAL  Final  Urine culture     Status: None   Collection Time: 04/09/15  1:31 AM  Result Value Ref Range Status   Specimen Description URINE, RANDOM  Final   Special Requests NONE  Final   Culture NO GROWTH 2 DAYS  Final   Report Status 04/11/2015 FINAL  Final  Blood Culture (routine x 2)  Status: None   Collection Time: 04/09/15  1:46 AM  Result Value Ref Range Status   Specimen Description BLOOD LEFT ASSIST CONTROL  Final   Special Requests BOTTLES DRAWN AEROBIC AND ANAEROBIC 4CC  Final   Culture NO GROWTH 6 DAYS  Final   Report Status 04/15/2015 FINAL  Final  Blood Culture (routine x 2)     Status: None (Preliminary result)   Collection Time: 04/14/15  9:27 PM  Result Value Ref Range Status   Specimen Description BLOOD BLOOD RIGHT FOREARM  Final   Special Requests BOTTLES DRAWN AEROBIC AND  ANAEROBIC 5ML  Final   Culture NO GROWTH 3 DAYS  Final   Report Status PENDING  Incomplete  Urine culture     Status: None   Collection Time: 04/15/15 11:51 AM  Result Value Ref Range Status   Specimen Description URINE, RANDOM  Final   Special Requests NONE  Final   Culture NO GROWTH 2 DAYS  Final   Report Status 04/17/2015 FINAL  Final    Medical History: Past Medical History  Diagnosis Date  . Barrett's esophagus 2007  . Sigmoid diverticulosis     by colonoscopy  . Aortic aneurysm ([place])   . Macular degeneration   . Anxiety   . Hyperlipidemia   . Hypertension   . Osteoporosis   . Lumbago   . Depression   . Bursitis 2012    left hip, improved with periodic steroid injection [facility])  . Anemia 2012    of acute blood loss, resolved  . GERD (gastroesophageal reflux disease)     with Barretts Esophagus  . COPD (chronic obstructive pulmonary disease) ([place])   . Arthritis     Medications:  Scheduled:  . antiseptic oral rinse  7 mL Mouth Rinse BID  . budesonide (PULMICORT) nebulizer solution  0.25 mg Nebulization BID  . enoxaparin (LOVENOX) injection  40 mg Subcutaneous Q24H  . feeding supplement (ENSURE ENLIVE)  237 mL Oral TID BM  . guaiFENesin  600 mg Oral BID  . ipratropium-albuterol  3 mL Nebulization Q4H  . pantoprazole  40 mg Oral QAC breakfast  . PARoxetine  20 mg Oral Daily  . piperacillin-tazobactam (ZOSYN)  IV  3.375 g Intravenous 3 times per day  . pravastatin  40 mg Oral q1800  . sodium chloride  3 mL Intravenous Q12H   Infusions:    Assessment: 81 y/o F on cefepime and vancomycin for HCAP. Cultures negative to date, so MD ok with d/c vancomycin and wants to change cefepime to Zosyn.   Plan:  Will order Zosyn 3.375 g EI q 8 hours. Will continue to follow renal funciton and culture results.  Ulice Dash D 04/17/2015,2:05 PM

## 2015-04-18 MED ORDER — BUDESONIDE-FORMOTEROL FUMARATE 80-4.5 MCG/ACT IN AERO: 2.0000 | INHALATION_SPRAY | Freq: Two times a day (BID) | RESPIRATORY_TRACT | Status: DC

## 2015-04-18 MED ORDER — GUAIFENESIN ER 600 MG PO TB12: 600.0000 mg | ORAL_TABLET | Freq: Two times a day (BID) | ORAL | Status: DC

## 2015-04-18 MED ORDER — AMOXICILLIN-POT CLAVULANATE 500-125 MG PO TABS: 1.0000 | ORAL_TABLET | Freq: Two times a day (BID) | ORAL | Status: DC

## 2015-04-18 NOTE — Progress Notes (Signed)
Physical Therapy Evaluation Patient Details Name: Anna Prince MRN: [id number] DOB: January 28, 1932 Today's Date: 04/18/2015   History of Present Illness  Pt admitted for community acquired pnemonia. Pt with history of HTN, hyperlipidemia, COPD, and GERD.   Clinical Impression  Patient is an 81 y.o. Female admitted after experiencing increasing SOB. Patient reportedly not using home oxygen. Experienced mild dizziness during ambulation that abolished with standing rest and increasing depth of breaths. Pulse oximeter not available at time of evaluation, so it would be beneficial at f/u to determine if saturations are falling during gait. Patient has hx of fall in past year when in garden. Has impairments of muscle strength, balance, and cardiovascular endurance. Patient will benefit from progressive PT to allow her to ambulate with LRAD within home without limitations.    Follow Up Recommendations Home health PT    Equipment Recommendations  None recommended by PT    Recommendations for Other Services       Precautions / Restrictions Precautions Precautions: None Restrictions Weight Bearing Restrictions: No      Mobility  Bed Mobility Overal bed mobility: Independent             General bed mobility comments: Patient requires no assistance for bed mobility.  Transfers Overall transfer level: Modified independent Equipment used: Rolling walker (2 wheeled)             General transfer comment: Patient moves from sit to stand with good safety awareness. Required verbal cues for proper hand placement when moving from stand to sit.  Ambulation/Gait Ambulation/Gait assistance: Min guard Ambulation Distance (Feet): 15 Feet Assistive device: Rolling walker (2 wheeled)       General Gait Details: Patient ambulates with increased forward trunk lean; verbal cues utilized to stand upright. Ambulates without oxygen, experiencing slight dizziness that abolished with standing  rest and increasing depth of breaths.  Stairs            Wheelchair Mobility    Modified Rankin (Stroke Patients Only)       Balance                                             Pertinent Vitals/Pain Pain Assessment: No/denies pain    Home Living Family/patient expects to be discharged to:: Private residence Living Arrangements: Children;Other (Comment) (2 daughters) Available Help at Discharge: Family;Available 24 hours/day Type of Home: House Home Access: Stairs to enter Entrance Stairs-Rails: None Entrance Stairs-Number of Steps: 2 Home Layout: One level Home Equipment: Walker - 2 wheels;Cane - single point      Prior Function Level of Independence: Independent with assistive device(s)               Hand Dominance        Extremity/Trunk Assessment   Upper Extremity Assessment: Overall WFL for tasks assessed           Lower Extremity Assessment: Generalized weakness      Cervical / Trunk Assessment: Kyphotic  Communication   Communication: No difficulties  Cognition Arousal/Alertness: Awake/alert Behavior During Therapy: WFL for tasks assessed/performed Overall Cognitive Status: Within Functional Limits for tasks assessed                      General Comments      Exercises        Assessment/Plan    PT Assessment  Patient needs continued PT services  PT Diagnosis Generalized weakness   PT Problem List Decreased strength;Decreased activity tolerance;Decreased balance;Decreased knowledge of use of DME;Decreased safety awareness;Cardiopulmonary status limiting activity  PT Treatment Interventions Gait training;Stair training;Functional mobility training;Therapeutic activities;Therapeutic exercise;Balance training;Patient/family education   PT Goals (Current goals can be found in the Care Plan section) Acute Rehab PT Goals Patient Stated Goal: "To go home" PT Goal Formulation: With patient Time For Goal  Achievement: 05/02/15 Potential to Achieve Goals: Good    Frequency Min 2X/week   Barriers to discharge        Co-evaluation               End of Session Equipment Utilized During Treatment: Gait belt;Oxygen Activity Tolerance: Patient limited by fatigue Patient left: in bed;with call bell/phone within reach;with family/visitor present Nurse Communication: Mobility status         Time: 0920-0940 PT Time Calculation (min) (ACUTE ONLY): 20 min   Charges:   PT Evaluation $Initial PT Evaluation Tier I: 1 Procedure     PT G Codes:        Dorice Lamas, PT, DPT 04/18/2015, 10:23 AM

## 2015-04-18 NOTE — Progress Notes (Signed)
SATURATION QUALIFICATIONS: (This note is used to comply with regulatory documentation for home oxygen)  Patient Saturations on Room Air at Rest = 87 room air  Pt O2 96% 2L

## 2015-04-18 NOTE — Discharge Summary (Signed)
[facility] NAME: Anna Prince    MR#:  [id number]  DATE OF BIRTH:  [birth year]  DATE OF ADMISSION:  04/14/2015 ADMITTING PHYSICIAN: Lance Coon, MD  DATE OF DISCHARGE: 04/18/2015  2:48 PM  PRIMARY CARE PHYSICIAN: Crecencio Mc, MD    ADMISSION DIAGNOSIS:  Elevated troponin I level [R79.89] HCAP (healthcare-associated pneumonia) [J18.9] Sepsis, due to unspecified organism ([place]) [A41.9]  DISCHARGE DIAGNOSIS:  Principal Problem:   Severe sepsis ([place]) Active Problems:   Barrett's esophagus   COPD (chronic obstructive pulmonary disease) (HCC)   AKI (acute kidney injury) ([place])   HCAP (healthcare-associated pneumonia)   NSTEMI (non-ST elevated myocardial infarction) ([place])   Anxiety   HTN (hypertension)   SECONDARY DIAGNOSIS:   Past Medical History  Diagnosis Date  . Barrett's esophagus 2007  . Sigmoid diverticulosis     by colonoscopy  . Aortic aneurysm ([place])   . Macular degeneration   . Anxiety   . Hyperlipidemia   . Hypertension   . Osteoporosis   . Lumbago   . Depression   . Bursitis 2012    left hip, improved with periodic steroid injection [facility] [place])  . Anemia 2012    of acute blood loss, resolved  . GERD (gastroesophageal reflux disease)     with Barretts Esophagus  . COPD (chronic obstructive pulmonary disease) ([place])   . Arthritis     HOSPITAL COURSE:   81 year old female with past medical history of COPD, history of Barrett's esophagus, macular degeneration, anxiety, hypertension, hyperlipidemia, osteoporosis, depression, chronic anemia, GERD who presented to the hospital due to shortness of breath and noted to have a pneumonia.  #1 acute respiratory failure with hypoxia-this was due to pneumonia seen on the chest x-ray admission. -Patient was recently hospitalized for a viral GI illness therefore it was nosocomial pneumonia. -Initially patient was started on broad-spectrum IV  antibiotics with vancomycin, cefepime and then switched over to IV Zosyn. She has significantly improved since admission and was ambulated and didn't qualify for home oxygen which was arranged for her prior to discharge. -She is being discharged on oral Augmentin for another 7 days.  #2 pneumonia-likely nosocomial pneumonia. -Initially treated with IV vancomycin, cefepime. Narrowed down to IV Zosyn. Patient's blood and sputum cultures have remained negative. She has been afebrile and hemodynamically stable for the past 48 hours. She is presently being discharged on oral Augmentin.  #3 COPD-patient had some minimal wheezing on the hospital. This improved with DuoNeb's and Pulmicort nebs and has not resolved. -She is being discharged on Symbicort, and albuterol inhaler as needed. She also qualied for home oxygen which was arranged for her prior to discharge.  #4 anxiety- she will continue her Valium, Paxil.  #5 elevated troponin-likely in the setting of demand ischemia from hypoxia. - appreciate Cards input no acute intervention.Pt's Echo showed normal EF of 60-65% with no wall motion abnormalities.  - she had no acute chest pain while in the hospital.   She was discharged home with home health physical therapy and nursing services.  DISCHARGE CONDITIONS:   Stable.   CONSULTS OBTAINED:  Treatment Team:  Yolonda Kida, MD  DRUG ALLERGIES:   Allergies  Allergen Reactions  . Morphine Sulfate Other (See Comments)    ANALGESICS-OPIOID Hallucinations    DISCHARGE MEDICATIONS:   Discharge Medication List as of 04/18/2015 11:46 AM    START taking these medications   Details  amoxicillin-clavulanate (AUGMENTIN) 500-125 MG tablet  Take 1 tablet (500 mg total) by mouth 2 (two) times daily., Starting 04/18/2015, Until Discontinued, Print    budesonide-formoterol (SYMBICORT) 80-4.5 MCG/ACT inhaler Inhale 2 puffs into the lungs 2 (two) times daily., Starting 04/18/2015, Until  Discontinued, Print    guaiFENesin (MUCINEX) 600 MG 12 hr tablet Take 1 tablet (600 mg total) by mouth 2 (two) times daily., Starting 04/18/2015, Until Discontinued, Print      CONTINUE these medications which have NOT CHANGED   Details  diazepam (VALIUM) 5 MG tablet TAKE 1 TABLET BY MOUTH TWICE DAILY AS NEEDED, Print    HYDROcodone-acetaminophen (NORCO/VICODIN) 5-325 MG tablet TAKE 1 TABLET BY MOUTH EVERY 6 HOURS as needed for back and hip pain, Print    lisinopril (PRINIVIL,ZESTRIL) 20 MG tablet TAKE 1 TABLET BY MOUTH EVERY DAY, Normal    lovastatin (MEVACOR) 40 MG tablet TAKE 1 TABLET BY MOUTH EVERY DAY, Normal    meloxicam (MOBIC) 15 MG tablet Take 1 tablet (15 mg total) by mouth daily., Starting 08/25/2014, Until Discontinued, Normal    mirtazapine (REMERON) 30 MG tablet TAKE 1 TABLET(30 MG) BY MOUTH AT BEDTIME, Normal    omeprazole (PRILOSEC) 40 MG capsule Take 1 capsule (40 mg total) by mouth daily., Starting 05/31/2014, Until Discontinued, Normal    PARoxetine (PAXIL) 20 MG tablet TAKE 1 TABLET(20 MG) BY MOUTH DAILY, Normal    albuterol (PROVENTIL HFA;VENTOLIN HFA) 108 (90 BASE) MCG/ACT inhaler Inhale 2 puffs into the lungs every 6 (six) hours as needed for wheezing., Starting 09/01/2014, Until Discontinued, No Print         DISCHARGE INSTRUCTIONS:   DIET:  Cardiac diet  DISCHARGE CONDITION:  Stable  ACTIVITY:  Activity as tolerated  OXYGEN:  Home Oxygen: Yes.     Oxygen Delivery: 2 liters/min via Patient connected to nasal cannula oxygen  DISCHARGE LOCATION:  Home with home health nursing and physical therapy   If you experience worsening of your admission symptoms, develop shortness of breath, life threatening emergency, suicidal or homicidal thoughts you must seek medical attention immediately by calling 911 or calling your MD immediately  if symptoms less severe.  You Must read complete instructions/literature along with all the possible adverse reactions/side  effects for all the Medicines you take and that have been prescribed to you. Take any new Medicines after you have completely understood and accpet all the possible adverse reactions/side effects.   Please note  You were cared for by a hospitalist during your hospital stay. If you have any questions about your discharge medications or the care you received while you were in the hospital after you are discharged, you can call the unit and asked to speak with the hospitalist on call if the hospitalist that took care of you is not available. Once you are discharged, your primary care physician will handle any further medical issues. Please note that NO REFILLS for any discharge medications will be authorized once you are discharged, as it is imperative that you return to your primary care physician (or establish a relationship with a primary care physician if you do not have one) for your aftercare needs so that they can reassess your need for medications and monitor your lab values.     Today   Wheezing, shortness of breath much improved. Afebrile overnight. Feels like she has more energy.  VITAL SIGNS:  Blood pressure 112/66, pulse 96, temperature 98.2 F (36.8 C), temperature source Oral, resp. rate 17, height 5\' 6"  (1.676 m), weight 62.596 kg (138 lb),  SpO2 95 %.  I/O:   Intake/Output Summary (Last 24 hours) at 04/18/15 1555 Last data filed at 04/18/15 1200  Gross per 24 hour  Intake    240 ml  Output    100 ml  Net    140 ml    PHYSICAL EXAMINATION:   GENERAL: 81 y.o.-year-old patient lying in the bed with no acute distress.  EYES: Pupils equal, round, reactive to light and accommodation. No scleral icterus. Extraocular muscles intact.  HEENT: Head atraumatic, normocephalic. Oropharynx and nasopharynx clear.  NECK: Supple, no jugular venous distention. No thyroid enlargement, no tenderness.  LUNGS: Prolonged inspiratory and expiratory phase, no wheezing, rales, rhonchi. No  use of accessory muscles of respiration.  CARDIOVASCULAR: S1, S2 normal. No murmurs, rubs, or gallops.  ABDOMEN: Soft, nontender, nondistended. Bowel sounds present. No organomegaly or mass.  EXTREMITIES: No cyanosis, clubbing or edema b/l.  NEUROLOGIC: Cranial nerves II through XII are intact. No focal Motor or sensory deficits b/l.  PSYCHIATRIC: The patient is alert and oriented x 3. Good affect SKIN: No obvious rash, lesion, or ulcer.    DATA REVIEW:   CBC  Recent Labs Lab 04/15/15 0751  WBC 9.2  HGB 11.0*  HCT 32.8*  PLT 162    Chemistries   Recent Labs Lab 04/14/15 2127  04/16/15 0522  NA 137  < > 140  K 3.5  < > 3.5  CL 104  < > 109  CO2 24  < > 23  GLUCOSE 145*  < > 109*  BUN 28*  < > 29*  CREATININE 1.31*  < > 1.05*  CALCIUM 8.5*  < > 8.6*  AST 39  --   --   ALT 29  --   --   ALKPHOS 99  --   --   BILITOT 0.6  --   --   < > = values in this interval not displayed.  Cardiac Enzymes  Recent Labs Lab 04/15/15 0751  TROPONINI 0.67*    Microbiology Results  Results for orders placed or performed during the hospital encounter of 04/14/15  Blood Culture (routine x 2)     Status: None (Preliminary result)   Collection Time: 04/14/15  9:27 PM  Result Value Ref Range Status   Specimen Description BLOOD BLOOD RIGHT FOREARM  Final   Special Requests BOTTLES DRAWN AEROBIC AND ANAEROBIC 5ML  Final   Culture NO GROWTH 4 DAYS  Final   Report Status PENDING  Incomplete  Urine culture     Status: None   Collection Time: 04/15/15 11:51 AM  Result Value Ref Range Status   Specimen Description URINE, RANDOM  Final   Special Requests NONE  Final   Culture NO GROWTH 2 DAYS  Final   Report Status 04/17/2015 FINAL  Final    RADIOLOGY:  No results found.    Management plans discussed with the patient, family and they are in agreement.  CODE STATUS:     Code Status Orders        Start     Ordered   04/15/15 0200  Full code   Continuous      04/15/15 0159      TOTAL TIME TAKING CARE OF THIS PATIENT: 40 minutes.    Henreitta Leber M.D on 04/18/2015 at 3:55 PM  Between 7am to 6pm - Pager - [phone]  After 6pm go to [email or web address] - password [email or web address] [facility]  [facility]  Office  [phone]  CC: Primary care physician; Derrel Nip,  Aris Everts, MD

## 2015-04-18 NOTE — Care Management Important Message (Signed)
Important Message  Patient Details  Name: DORATHA DISCALA MRN: [id number] Date of Birth: 06-28-1931   Medicare Important Message Given:  Yes    Mairi Stagliano A, RN 04/18/2015, 10:06 AM

## 2015-04-18 NOTE — Progress Notes (Signed)
Pt d/c home; d/c instructions reviewed w/ pt; pt understanding was verbalized; IV removed catheter in tact, gauze dressing applied; all pt questions answered; pt left unit via wheelchair accompanied by staff 

## 2015-04-18 NOTE — Discharge Instructions (Signed)
Follow all MD discharge instructions. Take all medications as prescribed. Keep all follow up appointments. If your symptoms return, call your doctor. If you experience any new symptoms that are of concern to you or that are bothersome to you, call your doctor. For all questions and/or concerns, call your doctor. ° ° If you have a medical emergency, call 911 ° ° ° °

## 2015-04-18 NOTE — Care Management Note (Signed)
Case Management Note  Patient Details  Name: ELOWYNN NAVARRETTE MRN: [id number] Date of Birth: 10/10/1931  Subjective/Objective:  Mrs Grinnell qualifies for continuous oxygen per O2 SAT trials. Will Anderson at [facility] has been notified and will deliver a portable oxygen tank to Mrs Moraes's room and will arrange for a home oxygen set up. Mrs Ankeny wants to talk to her daughter before deciding upon a home health provider for home health PT and RN services.    Action/Plan:   Expected Discharge Date:                  Expected Discharge Plan:     In-House Referral:     Discharge planning Services     Post Acute Care Choice:    Choice offered to:     DME Arranged:    DME Agency:     HH Arranged:    [place] Agency:     Status of Service:     Medicare Important Message Given:  Yes Date Medicare IM Given:    Medicare IM give by:    Date Additional Medicare IM Given:    Additional Medicare Important Message give by:     If discussed at [place] of Stay Meetings, dates discussed:    Additional Comments:  Brayan Votaw A, RN 04/18/2015, 10:44 AM

## 2015-04-18 NOTE — Care Management Note (Signed)
Case Management Note  Patient Details  Name: NELEH MCCLASKEY MRN: [id number] Date of Birth: [birth year]  Subjective/Objective:    Discussed discharge planning with Ms Leedham and her daughter Steva Ready. [name] reports that Mrs Guldan has memory problems and that all home health and new oxygen appointments and questions should be called to Steva Ready at cell: [phone], or home [phone]. Steva Ready chose [facility] to provide both new oxygen and home health RN, PT services to Ms Beagan. Referrals called to Floydene Flock and Will Ouida Sills at [facility].                    Action/Plan:   Expected Discharge Date:                  Expected Discharge Plan:     In-House Referral:     Discharge planning Services     Post Acute Care Choice:    Choice offered to:     DME Arranged:    DME Agency:     HH Arranged:    [place] Agency:     Status of Service:     Medicare Important Message Given:  Yes Date Medicare IM Given:    Medicare IM give by:    Date Additional Medicare IM Given:    Additional Medicare Important Message give by:     If discussed at [place] of Stay Meetings, dates discussed:    Additional Comments:  Karsyn Jamie A, RN 04/18/2015, 12:07 PM

## 2015-04-19 ENCOUNTER — Telehealth: Payer: Self-pay

## 2015-04-19 LAB — GLUCOSE, CAPILLARY: Glucose-Capillary: 152 mg/dL — ABNORMAL HIGH (ref 65–99)

## 2015-04-19 NOTE — Telephone Encounter (Signed)
Unable to reach patient.  First TCM attempt made.  Will continue to follow.

## 2015-04-20 ENCOUNTER — Telehealth: Payer: Self-pay

## 2015-04-20 LAB — CULTURE, BLOOD (ROUTINE X 2): Culture: NO GROWTH

## 2015-04-20 NOTE — Telephone Encounter (Signed)
Transition Care Management Follow-up Telephone Call   Date discharged? 04/18/15   How have you been since you were released from the hospital? I am doing okay, it is a process.  No SOB with home oxygen and no wheezing.  I had a little chest pain earlier today, but it was very little and is now gone.     Do you understand why you were in the hospital?  Yes, I was having trouble breathing.   Do you understand the discharge instructions? Yes, my oxygen is set at 2L, I am moving slowly between activities, I am doing PT is here 3 times weekly and my daughters are here helping me.   Where were you discharged to? Home   Items Reviewed:  Medications reviewed: Yes, started taking Augmentin 500-125mg , Symbicort and Mucinex.  Continuing all other scheduled medications as prescribed.  Allergies reviewed: Yes, no problems.  Dietary changes reviewed: Yes, cardiac diet.  No problems.  Referrals reviewed: Yes   Functional Questionnaire:   Activities of Daily Living (ADLs):   She states they are independent in the following: Bathing, dressing, self feeding States they require assistance with the following: Ambulating (uses walker), toileting (uses BSC), meal prep (daughters assist).  Home Health nursing services assist.   Any transportation issues/concerns?: No, daughters to bring.   Any patient concerns? None at this time.   Confirmed importance and date/time of follow-up visits scheduled Yes, appointment made 04/27/14 at 10:00.  Provider Appointment booked with Dr. Derrel Nip (PCP).  Confirmed with patient if condition begins to worsen call PCP or go to the ER.  Patient was given the office number and encouraged to call back with question or concerns.  : Yes, patient verbalized understanding.

## 2015-04-28 ENCOUNTER — Encounter: Payer: Self-pay | Admitting: Internal Medicine

## 2015-04-28 ENCOUNTER — Ambulatory Visit (INDEPENDENT_AMBULATORY_CARE_PROVIDER_SITE_OTHER): Payer: Medicare Other | Admitting: Internal Medicine

## 2015-04-28 VITALS — BP 98/60 | HR 58 | Temp 98.3°F | Resp 12 | Ht 66.0 in | Wt 139.1 lb

## 2015-04-28 DIAGNOSIS — J9621 Acute and chronic respiratory failure with hypoxia: Secondary | ICD-10-CM | POA: Diagnosis not present

## 2015-04-28 DIAGNOSIS — I952 Hypotension due to drugs: Secondary | ICD-10-CM | POA: Diagnosis not present

## 2015-04-28 DIAGNOSIS — Z09 Encounter for follow-up examination after completed treatment for conditions other than malignant neoplasm: Secondary | ICD-10-CM

## 2015-04-28 DIAGNOSIS — J181 Lobar pneumonia, unspecified organism: Principal | ICD-10-CM

## 2015-04-28 DIAGNOSIS — J189 Pneumonia, unspecified organism: Secondary | ICD-10-CM | POA: Diagnosis not present

## 2015-04-28 MED ORDER — NYSTATIN 100000 UNIT/ML MT SUSP: 500000.0000 [IU] | Freq: Four times a day (QID) | OROMUCOSAL | Status: DC

## 2015-04-28 NOTE — Progress Notes (Signed)
Pre-visit discussion using our clinic review tool. No additional management support is needed unless otherwise documented below in the visit note.  

## 2015-04-28 NOTE — Progress Notes (Signed)
Subjective:  Patient ID: Anna Prince, female    DOB: May 30, 1931  Age: 82 y.o. MRN: [id number]  CC: The primary encounter diagnosis was Pneumonia of right lower lobe due to infectious organism. Diagnoses of Acute on chronic respiratory failure with hypoxia [facility]), Hospital discharge follow-up, and Hypotension due to drugs were also pertinent to this visit.  HPI Anna Prince presents for hospital follow up.  Patient was readmitted  On Dec 22  with acute hypoxic respiratory  Failure secondary to RLL infiltrate.   Home pulse ox was 84% on RA  and pulse 117.  EMS was called.  She was taken to ER with acute resp failure  and RLL infiltrate, treated as a nosocomial PNA with Vanc and Cefipime  Then transitioned to augmentin at discharge on  Dec 26 for 7 days . Sputum and blood cultures were negative .  She was discharged home on supplemental oxygen at 2 L O2.   Home Health RN and PT in place .    She feels much better than she did at her last visit, which was the day before she was readmitted.   She is sleeping well,  her appetite has improved,  And she has more energy.      Outpatient Prescriptions Prior to Visit  Medication Sig Dispense Refill  . albuterol (PROVENTIL HFA;VENTOLIN HFA) 108 (90 BASE) MCG/ACT inhaler Inhale 2 puffs into the lungs every 6 (six) hours as needed for wheezing. 8.5 g 11  . budesonide-formoterol (SYMBICORT) 80-4.5 MCG/ACT inhaler Inhale 2 puffs into the lungs 2 (two) times daily. 1 Inhaler 12  . diazepam (VALIUM) 5 MG tablet TAKE 1 TABLET BY MOUTH TWICE DAILY AS NEEDED 60 tablet 1  . guaiFENesin (MUCINEX) 600 MG 12 hr tablet Take 1 tablet (600 mg total) by mouth 2 (two) times daily. 20 tablet 0  . HYDROcodone-acetaminophen (NORCO/VICODIN) 5-325 MG tablet TAKE 1 TABLET BY MOUTH EVERY 6 HOURS as needed for back and hip pain 60 tablet 0  . lisinopril (PRINIVIL,ZESTRIL) 20 MG tablet TAKE 1 TABLET BY MOUTH EVERY DAY 90 tablet 1  . lovastatin (MEVACOR) 40 MG tablet TAKE  1 TABLET BY MOUTH EVERY DAY 90 tablet 0  . meloxicam (MOBIC) 15 MG tablet Take 1 tablet (15 mg total) by mouth daily. 30 tablet 5  . mirtazapine (REMERON) 30 MG tablet TAKE 1 TABLET(30 MG) BY MOUTH AT BEDTIME 30 tablet 0  . omeprazole (PRILOSEC) 40 MG capsule Take 1 capsule (40 mg total) by mouth daily. 90 capsule 3  . PARoxetine (PAXIL) 20 MG tablet TAKE 1 TABLET(20 MG) BY MOUTH DAILY 30 tablet 0  . amoxicillin-clavulanate (AUGMENTIN) 500-125 MG tablet Take 1 tablet (500 mg total) by mouth 2 (two) times daily. (Patient not taking: Reported on 04/28/2015) 14 tablet 0   No facility-administered medications prior to visit.    Review of Systems;  Patient denies headache, fevers, malaise, unintentional weight loss, skin rash, eye pain, sinus congestion and sinus pain, sore throat, dysphagia,  hemoptysis , cough, dyspnea, wheezing, chest pain, palpitations, orthopnea, edema, abdominal pain, nausea, melena, diarrhea, constipation, flank pain, dysuria, hematuria, urinary  Frequency, nocturia, numbness, tingling, seizures,  Focal weakness, Loss of consciousness,  Tremor, insomnia, depression, anxiety, and suicidal ideation.      Objective:  BP 98/60 mmHg  Pulse 58  Temp(Src) 98.3 F (36.8 C) (Oral)  Resp 12  Ht 5\' 6"  (1.676 m)  Wt 139 lb 2 oz (63.107 kg)  BMI 22.47 kg/m2  SpO2 98%  BP Readings from Last 3 Encounters:  04/28/15 98/60  04/18/15 112/66  04/13/15 98/68    Wt Readings from Last 3 Encounters:  04/28/15 139 lb 2 oz (63.107 kg)  04/14/15 138 lb (62.596 kg)  04/13/15 138 lb 12 oz (62.937 kg)    General appearance: alert, cooperative and appears stated age Ears: normal TM's and external ear canals both ears Throat: lips, mucosa, and tongue normal; teeth and gums normal Neck: no adenopathy, no carotid bruit, supple, symmetrical, trachea midline and thyroid not enlarged, symmetric, no tenderness/mass/nodules Back: symmetric, no curvature. ROM normal. No CVA tenderness. Lungs:  clear to auscultation bilaterally Heart: regular rate and rhythm, S1, S2 normal, no murmur, click, rub or gallop Abdomen: soft, non-tender; bowel sounds normal; no masses,  no organomegaly Pulses: 2+ and symmetric Skin: Skin color, texture, turgor normal. No rashes or lesions Lymph nodes: Cervical, supraclavicular, and axillary nodes normal.  No results found for: HGBA1C  Lab Results  Component Value Date   CREATININE 1.05* 04/16/2015   CREATININE 1.29* 04/15/2015   CREATININE 1.31* 04/14/2015    Lab Results  Component Value Date   WBC 9.2 04/15/2015   HGB 11.0* 04/15/2015   HCT 32.8* 04/15/2015   PLT 162 04/15/2015   GLUCOSE 109* 04/16/2015   CHOL 169 08/24/2014   TRIG 91.0 08/24/2014   HDL 66.10 08/24/2014   LDLDIRECT 94.0 11/25/2014   LDLCALC 85 08/24/2014   ALT 29 04/14/2015   AST 39 04/14/2015   NA 140 04/16/2015   K 3.5 04/16/2015   CL 109 04/16/2015   CREATININE 1.05* 04/16/2015   BUN 29* 04/16/2015   CO2 23 04/16/2015   TSH 0.484 04/09/2015   INR 1.04 04/14/2015    No results found.  Assessment & Plan:   Problem List Items Addressed This Visit    Hospital discharge follow-up    She has completed 10 days of antibiotics and her lung exam is clear,  Will begin to wean 02 with trials at home.  Home ealth RN and PT are in place.       Acute on chronic respiratory failure ([place])    Secondary to RLL pneumonia and advanced COPD requiring readmission.  patient is currently 02 dependent s/p discharge from readmission. Lungs are clear today and her cough has resolved.       Hypotension    Advised to suspend lisinopril until bp is > 140/80       Other Visit Diagnoses    Pneumonia of right lower lobe due to infectious organism    -  Primary    Relevant Orders    DG Chest 2 View       I am having Ms. Guyett start on nystatin. I am also having her maintain her omeprazole, meloxicam, albuterol, lisinopril, PARoxetine, mirtazapine, lovastatin, diazepam,  HYDROcodone-acetaminophen, guaiFENesin, amoxicillin-clavulanate, and budesonide-formoterol.  Meds ordered this encounter  Medications  . nystatin (MYCOSTATIN) 100000 UNIT/ML suspension    Sig: Take 5 mLs (500,000 Units total) by mouth 4 (four) times daily.    Dispense:  100 mL    Refill:  0    There are no discontinued medications.  Follow-up: No Follow-up on file.   Crecencio Mc, MD

## 2015-04-28 NOTE — Patient Instructions (Addendum)
Check her pre  AND POST SHOWER ROOM AIR OXYGEN LEVEL.    Please take a probiotic ( Align, Floraque or Culturelle) oR the generic version of one of these  For a minimum of 3 weeks to prevent a serious antibiotic associated diarrhea  Called clostridium dificile colitis  STOP THE LISINOPRIL UNTIL YOUR BP BECOMES PERSISTENTLY > 140/80    USE THE NYSTATIN AFTER YOUR SYMBICORT   RETURN IN 6 WEEKS , AFTER A REPEAT CHEST X RAY

## 2015-05-01 DIAGNOSIS — I959 Hypotension, unspecified: Secondary | ICD-10-CM | POA: Insufficient documentation

## 2015-05-01 DIAGNOSIS — J9620 Acute and chronic respiratory failure, unspecified whether with hypoxia or hypercapnia: Secondary | ICD-10-CM | POA: Insufficient documentation

## 2015-05-01 NOTE — Assessment & Plan Note (Signed)
She has completed 10 days of antibiotics and her lung exam is clear,  Will begin to wean 02 with trials at home.  Home ealth RN and PT are in place.

## 2015-05-01 NOTE — Assessment & Plan Note (Signed)
Advised to suspend lisinopril until bp is > 140/80

## 2015-05-01 NOTE — Assessment & Plan Note (Signed)
Secondary to RLL pneumonia and advanced COPD requiring readmission.  patient is currently 02 dependent s/p discharge from readmission. Lungs are clear today and her cough has resolved.

## 2015-05-03 ENCOUNTER — Emergency Department
Admission: EM | Admit: 2015-05-03 | Discharge: 2015-05-03 | Disposition: A | Discharge status: Home / Self Care | Payer: Medicare Other | Attending: Emergency Medicine | Admitting: Emergency Medicine

## 2015-05-03 ENCOUNTER — Emergency Department: Payer: Medicare Other

## 2015-05-03 DIAGNOSIS — Z792 Long term (current) use of antibiotics: Secondary | ICD-10-CM | POA: Diagnosis not present

## 2015-05-03 DIAGNOSIS — R0789 Other chest pain: Secondary | ICD-10-CM

## 2015-05-03 DIAGNOSIS — Z87891 Personal history of nicotine dependence: Secondary | ICD-10-CM | POA: Insufficient documentation

## 2015-05-03 DIAGNOSIS — R001 Bradycardia, unspecified: Secondary | ICD-10-CM | POA: Diagnosis not present

## 2015-05-03 DIAGNOSIS — Z791 Long term (current) use of non-steroidal anti-inflammatories (NSAID): Secondary | ICD-10-CM | POA: Diagnosis not present

## 2015-05-03 DIAGNOSIS — Z79899 Other long term (current) drug therapy: Secondary | ICD-10-CM | POA: Insufficient documentation

## 2015-05-03 DIAGNOSIS — Y9289 Other specified places as the place of occurrence of the external cause: Secondary | ICD-10-CM | POA: Diagnosis not present

## 2015-05-03 DIAGNOSIS — W06XXXA Fall from bed, initial encounter: Secondary | ICD-10-CM | POA: Diagnosis not present

## 2015-05-03 DIAGNOSIS — Y998 Other external cause status: Secondary | ICD-10-CM | POA: Diagnosis not present

## 2015-05-03 DIAGNOSIS — S29001A Unspecified injury of muscle and tendon of front wall of thorax, initial encounter: Secondary | ICD-10-CM | POA: Diagnosis not present

## 2015-05-03 DIAGNOSIS — Y9389 Activity, other specified: Secondary | ICD-10-CM | POA: Insufficient documentation

## 2015-05-03 DIAGNOSIS — I10 Essential (primary) hypertension: Secondary | ICD-10-CM | POA: Diagnosis not present

## 2015-05-03 DIAGNOSIS — R9431 Abnormal electrocardiogram [ECG] [EKG]: Secondary | ICD-10-CM | POA: Insufficient documentation

## 2015-05-03 LAB — COMPREHENSIVE METABOLIC PANEL
ALK PHOS: 62 U/L (ref 38–126)
ALT: 10 U/L — AB (ref 14–54)
AST: 14 U/L — AB (ref 15–41)
Albumin: 2.7 g/dL — ABNORMAL LOW (ref 3.5–5.0)
Anion gap: 8 (ref 5–15)
BUN: 21 mg/dL — AB (ref 6–20)
CALCIUM: 8.7 mg/dL — AB (ref 8.9–10.3)
CHLORIDE: 105 mmol/L (ref 101–111)
CO2: 24 mmol/L (ref 22–32)
CREATININE: 1.28 mg/dL — AB (ref 0.44–1.00)
GFR calc non Af Amer: 38 mL/min — ABNORMAL LOW (ref >60)
GFR, EST AFRICAN AMERICAN: 44 mL/min — AB (ref >60)
Glucose, Bld: 128 mg/dL — ABNORMAL HIGH (ref 65–99)
Potassium: 3.5 mmol/L (ref 3.5–5.1)
SODIUM: 137 mmol/L (ref 135–145)
Total Bilirubin: 0.3 mg/dL (ref 0.3–1.2)
Total Protein: 6.4 g/dL — ABNORMAL LOW (ref 6.5–8.1)

## 2015-05-03 LAB — CBC WITH DIFFERENTIAL/PLATELET
BASOS PCT: 1 %
Basophils Absolute: 0 10*3/uL (ref 0–0.1)
Eosinophils Absolute: 0.1 10*3/uL (ref 0–0.7)
Eosinophils Relative: 2 %
HEMATOCRIT: 29 % — AB (ref 35.0–47.0)
HEMOGLOBIN: 9.5 g/dL — AB (ref 12.0–16.0)
LYMPHS ABS: 0.6 10*3/uL — AB (ref 1.0–3.6)
Lymphocytes Relative: 12 %
MCH: 30.4 pg (ref 26.0–34.0)
MCHC: 32.7 g/dL (ref 32.0–36.0)
MCV: 92.9 fL (ref 80.0–100.0)
MONOS PCT: 7 %
Monocytes Absolute: 0.4 10*3/uL (ref 0.2–0.9)
NEUTROS ABS: 4 10*3/uL (ref 1.4–6.5)
NEUTROS PCT: 78 %
Platelets: 158 10*3/uL (ref 150–440)
RBC: 3.12 MIL/uL — AB (ref 3.80–5.20)
RDW: 14.2 % (ref 11.5–14.5)
WBC: 5.2 10*3/uL (ref 3.6–11.0)

## 2015-05-03 LAB — PROTIME-INR
INR: 1
PROTHROMBIN TIME: 13.4 s (ref 11.4–15.0)

## 2015-05-03 LAB — TROPONIN I: Troponin I: <0.03 ng/mL (ref <0.031)

## 2015-05-03 LAB — MAGNESIUM: MAGNESIUM: 1.7 mg/dL (ref 1.7–2.4)

## 2015-05-03 LAB — APTT: aPTT: 29 seconds (ref 24–36)

## 2015-05-03 NOTE — Discharge Instructions (Signed)
You have been seen in the Emergency Department (ED) today for right-sided chest wall pain.  As we discussed, your chest xray was reassuring (improving pneumonia and no rib fractures), and we believe your pain is due to pain/strain and/or inflammation of the muscles and/or cartilage of your chest wall.  We recommend you take ibuprofen 400 mg three times a day with meals for the next 5 days (unless you have been told previously not to take ibuprofen or NSAIDs in general).  You may also take Tylenol according to the label instructions.  Read through the included information for additional treatment recommendations and precautions.  Continue to take your regular medications.   Return to the Emergency Department (ED) if you experience any further chest pain/pressure/tightness, difficulty breathing, or sudden sweating, or other symptoms that concern you.   Chest Wall Pain Chest wall pain is pain in or around the bones and muscles of your chest. Sometimes, an injury causes this pain. Sometimes, the cause may not be known. This pain may take several weeks or longer to get better. HOME CARE INSTRUCTIONS  Pay attention to any changes in your symptoms. Take these actions to help with your pain:   Rest as told by your health care provider.   Avoid activities that cause pain. These include any activities that use your chest muscles or your abdominal and side muscles to lift heavy items.   If directed, apply ice to the painful area:  Put ice in a plastic bag.  Place a towel between your skin and the bag.  Leave the ice on for 20 minutes, 2-3 times per day.  Take over-the-counter and prescription medicines only as told by your health care provider.  Do not use tobacco products, including cigarettes, chewing tobacco, and e-cigarettes. If you need help quitting, ask your health care provider.  Keep all follow-up visits as told by your health care provider. This is important. SEEK MEDICAL CARE  IF:  You have a fever.  Your chest pain becomes worse.  You have new symptoms. SEEK IMMEDIATE MEDICAL CARE IF:  You have nausea or vomiting.  You feel sweaty or light-headed.  You have a cough with phlegm (sputum) or you cough up blood.  You develop shortness of breath.   This information is not intended to replace advice given to you by your health care provider. Make sure you discuss any questions you have with your health care provider.   Document Released: 04/09/2005 Document Revised: 12/29/2014 Document Reviewed: 07/05/2014 Elsevier Interactive Patient Education [employer].

## 2015-05-03 NOTE — ED Provider Notes (Signed)
[facility] Emergency Department Provider Note  ____________________________________________  Time seen: Approximately 3:28 PM  I have reviewed the triage vital signs and the nursing notes.   HISTORY  Chief Complaint Rib Injury    HPI Anna Prince is a 82 y.o. female with a history of COPD on chronic oxygen and several episodes of recent pneumonia.  She presents with episodic right sided chest wall pain for several days.  She thinks this is because she slid out of bed and struck her ribs on the edge of the bed.  She notices it most when she is up walking around.  She describes it as a stabbing pain in her right posterior lateral ribs.  There is no accompanying shortness of breath or anterior chest pain.  It is not radiating anywhere.  She describes it as severe but brief and then resolves completely.  It is somewhat reproducible to palpation of the posteriolateral chest wall.    She has had several recent admissions to the hospital, first for a fever and persistent vomiting and then for healthcare associated pneumonia.  She states that her cough and her shortness of breath is improving.  She has not had any nausea or vomiting, fever/chills, anterior chest pain, abdominal pain, dysuria.   Past Medical History  Diagnosis Date  . Barrett's esophagus 2007  . Sigmoid diverticulosis     by colonoscopy  . Aortic aneurysm ([place])   . Macular degeneration   . Anxiety   . Hyperlipidemia   . Hypertension   . Osteoporosis   . Lumbago   . Depression   . Bursitis 2012    left hip, improved with periodic steroid injection [facility] [place])  . Anemia 2012    of acute blood loss, resolved  . GERD (gastroesophageal reflux disease)     with Barretts Esophagus  . COPD (chronic obstructive pulmonary disease) ([place])   . Arthritis     Patient Active Problem List   Diagnosis Date Noted  . Acute on chronic respiratory failure ([place]) 05/01/2015  . Hypotension 05/01/2015   . Generalized muscle weakness 04/16/2015  . Hospital discharge follow-up 04/16/2015  . Severe sepsis ([place]) 04/14/2015  . HCAP (healthcare-associated pneumonia) 04/14/2015  . NSTEMI (non-ST elevated myocardial infarction) ([place]) 04/14/2015  . Anxiety 04/14/2015  . HTN (hypertension) 04/14/2015  . Candida esophagitis ([place]) 02/10/2015  . AKI (acute kidney injury) ([place]) 02/03/2015  . Underweight 11/27/2014  . Hyperkalemia 11/27/2014  . Chronic left hip pain 11/27/2014  . Anemia 11/25/2014  . Elevated liver enzymes 08/25/2014  . Pubic ramus fracture ([place]) 08/25/2014  . Vascular dementia of acute onset without behavioral disturbance 02/20/2014  . Chronic pain syndrome 05/27/2013  . Tobacco abuse counseling 04/10/2013  . Skin lesion of face 02/22/2012  . GERD (gastroesophageal reflux disease)   . Screening for colon cancer 06/18/2011  . COPD (chronic obstructive pulmonary disease) ([place]) 06/18/2011  . Bursitis   . Skin cancer of nose 12/17/2010  . Vitamin D deficiency 12/17/2010  . Hyperlipidemia LDL goal <100 12/17/2010  . Screening for malignant neoplasm of breast 12/17/2010  . Encounter for long-term (current) use of other medications 12/17/2010  . Aortic aneurysm, abdominal ([place]) 12/17/2010  . Diverticulosis, sigmoid 12/08/2010  . Barrett's esophagus 12/08/2010  . Hyperlipidemia 12/08/2010  . Hypertension 12/08/2010  . Lumbago 12/08/2010  . Osteoporosis 12/08/2010  . Depression 12/08/2010    Past Surgical History  Procedure Laterality Date  . Appendectomy    . Back surgery  X2..1975 arachnoid cyst cervical region([place]);1997 lumbosacral tumor,9 hr surgery  . Abdominal aortic aneurysm repair  July 2010    [facility]    Current Outpatient Rx  Name  Route  Sig  Dispense  Refill  . albuterol (PROVENTIL HFA;VENTOLIN HFA) 108 (90 Base) MCG/ACT inhaler   Inhalation   Inhale 2 puffs into the lungs every 6 (six) hours as needed for wheezing or shortness of breath.          . diazepam (VALIUM) 5 MG tablet   Oral   Take 5 mg by mouth 2 (two) times daily as needed for anxiety.         Marland Kitchen HYDROcodone-acetaminophen (NORCO/VICODIN) 5-325 MG tablet   Oral   Take 1 tablet by mouth every 6 (six) hours as needed for moderate pain.         Marland Kitchen lovastatin (MEVACOR) 40 MG tablet   Oral   Take 40 mg by mouth daily.         . meloxicam (MOBIC) 15 MG tablet   Oral   Take 1 tablet (15 mg total) by mouth daily.   30 tablet   5   . mirtazapine (REMERON) 30 MG tablet   Oral   Take 30 mg by mouth at bedtime.         Marland Kitchen nystatin (MYCOSTATIN) 100000 UNIT/ML suspension   Oral   Take 5 mLs (500,000 Units total) by mouth 4 (four) times daily. Patient taking differently: Take 500,000 Units by mouth 4 (four) times daily as needed (for thrush).    100 mL   0   . omeprazole (PRILOSEC) 40 MG capsule   Oral   Take 1 capsule (40 mg total) by mouth daily.   90 capsule   3   . PARoxetine (PAXIL) 20 MG tablet   Oral   Take 20 mg by mouth daily.         Marland Kitchen amoxicillin-clavulanate (AUGMENTIN) 500-125 MG tablet   Oral   Take 1 tablet (500 mg total) by mouth 2 (two) times daily. Patient not taking: Reported on 04/28/2015   14 tablet   0   . budesonide-formoterol (SYMBICORT) 80-4.5 MCG/ACT inhaler   Inhalation   Inhale 2 puffs into the lungs 2 (two) times daily. Patient not taking: Reported on 05/03/2015   1 Inhaler   12   . guaiFENesin (MUCINEX) 600 MG 12 hr tablet   Oral   Take 1 tablet (600 mg total) by mouth 2 (two) times daily. Patient not taking: Reported on 05/03/2015   20 tablet   0     Allergies Morphine sulfate  Family History  Problem Relation Age of Onset  . Coronary artery disease Mother   . Coronary artery disease Father 52  . Cancer Neg Hx     no breast,coon or ovarian ca    Social History Social History  Substance Use Topics  . Smoking status: Former Smoker -- 0.50 packs/day    Types: Cigarettes  . Smokeless tobacco: Never  Used     Comment: HAS BEEN SMOKING 30+ YEARS,RESUMED TOBACCO USE AFTER AAA REPAIR  . Alcohol Use: No    Review of Systems Constitutional: No fever/chills Eyes: No visual changes. ENT: No sore throat. Cardiovascular: Posterior lateral chest wall pain that is sharp and stabbing and episodic Respiratory: Denies shortness of breath. Gastrointestinal: No abdominal pain.  No nausea, no vomiting.  No diarrhea.  No constipation. Genitourinary: Negative for dysuria. Musculoskeletal: Negative for back pain. Skin: Negative for rash.  Neurological: Negative for headaches, focal weakness or numbness.  10-point ROS otherwise negative.  ____________________________________________   PHYSICAL EXAM:  VITAL SIGNS: ED Triage Vitals  Enc Vitals Group     BP 05/03/15 1054 95/60 mmHg     Pulse Rate 05/03/15 1054 72     Resp 05/03/15 1054 18     Temp 05/03/15 1054 97.8 F (36.6 C)     Temp Source 05/03/15 1054 Oral     SpO2 05/03/15 1054 100 %     Weight 05/03/15 1054 139 lb (63.05 kg)     Height 05/03/15 1054 5\' 5"  (1.651 m)     Head Cir --      Peak Flow --      Pain Score 05/03/15 1055 0     Pain Loc --      Pain Edu? --      Excl. in [place]? --     Constitutional: Alert and oriented. Well appearing and in no acute distress.  Wearing chronic oxygen. Eyes: Conjunctivae are normal. PERRL. EOMI. Head: Atraumatic. Nose: No congestion/rhinnorhea. Mouth/Throat: Mucous membranes are moist.  Oropharynx non-erythematous. Neck: No stridor.   Cardiovascular: Bradycardia, regular rhythm. Grossly normal heart sounds.  Good peripheral circulation. Respiratory: Normal respiratory effort.  No retractions. Chronic rales in bases.  Mild reproducible TTP of right posterior ribs, but without any evidence of contusion.   Gastrointestinal: Soft and nontender. No distention. No abdominal bruits.  Musculoskeletal: No lower extremity tenderness nor edema.  No joint effusions. Neurologic:  Normal speech and  language. No gross focal neurologic deficits are appreciated.  Skin:  Skin is warm, dry and intact. No rash noted. Psychiatric: Mood and affect are normal. Speech and behavior are normal.  ____________________________________________   LABS (all labs ordered are listed, but only abnormal results are displayed)  Labs Reviewed  CBC WITH DIFFERENTIAL/PLATELET - Abnormal; Notable for the following:    RBC 3.12 (*)    Hemoglobin 9.5 (*)    HCT 29.0 (*)    Lymphs Abs 0.6 (*)    All other components within normal limits  COMPREHENSIVE METABOLIC PANEL - Abnormal; Notable for the following:    Glucose, Bld 128 (*)    BUN 21 (*)    Creatinine, Ser 1.28 (*)    Calcium 8.7 (*)    Total Protein 6.4 (*)    Albumin 2.7 (*)    AST 14 (*)    ALT 10 (*)    GFR calc non Af Amer 38 (*)    GFR calc Af Amer 44 (*)    All other components within normal limits  TROPONIN I  MAGNESIUM  APTT  PROTIME-INR   ____________________________________________  EKG  ED ECG REPORT I, Aleysha Meckler, the attending physician, personally viewed and interpreted this ECG.   Date: 05/03/2015  EKG Time: 16:17  Rate: 70  Rhythm: Sinus arrhythmia  Axis: LAD  Intervals:Normal but with sinus arrhythmia, no evidence of heart block  ST&T Change: T wave inversions in leads V2, V3, V4, V5, as well as leads 2, 3, and aVF.  ED ECG REPORT #2 I, Markasia Carrol, the attending physician, personally viewed and interpreted this ECG.   Date: 05/03/2015  EKG Time: 18:25  Rate: 68  Rhythm: normal sinus rhythm with sinus arrhythmia  Axis: LAD  Intervals:normal  ST&T Change: Inverted T waves in leads V2-V4, normal T wave in lead V5, T waves changed from prior   ____________________________________________  RADIOLOGY   Dg Chest 2 View  05/03/2015  CLINICAL DATA:  Fall, right posterior rib pain.  Recent pneumonia. EXAM: CHEST  2 VIEW COMPARISON:  04/14/2015 FINDINGS: There is hyperinflation of the lungs compatible with  COPD. Improving right lower lobe airspace disease. Small hiatal hernia. Heart is upper limits normal in size. No confluent airspace opacities or effusions. No acute bony abnormality. No visible displaced rib fracture or pneumothorax. IMPRESSION: COPD/chronic changes.  No active disease. Electronically Signed   By: Rolm Baptise M.D.   On: 05/03/2015 11:38    ____________________________________________   PROCEDURES  Procedure(s) performed: None  Critical Care performed: No ____________________________________________   INITIAL IMPRESSION / ASSESSMENT AND PLAN / ED COURSE  Pertinent labs & imaging results that were available during my care of the patient were reviewed by me and considered in my medical decision making (see chart for details).  Initially the thought was that the patient was having rib pain from a contusion.  However, the nurse noted that the patient was having occasional episodes of bradycardia down in the 40s and even the 30s.  These were asymptomatic and the patient has no current complaints, but we obtained an EKG and found that the patient has significant EKG changes in the septal leads compared to the recent EKGs from her recent hospitalization.  In particular, she has inverted T waves in leads V2 through V5 with a concerning morphology particularly in V4.  She also has inverted T waves in leads 2, 3, and aVF.  Additionally, she seems to be having almost a trigeminy pattern with a positive between the triplets and is difficult to appreciate if there are P waves in between which might indicate a Mobitz type II heart block.  I called at approximately 4:56 PM and spoke by phone with Dr. Clayborn Bigness, who saw the patient while she was in the hospital and who is the on-call cardiologist at this time.  I explained him my concerns and he is coming to the emergency department to evaluate the EKG.  Additionally we are awaiting blood work.  I have explained all this to the patient and her  daughter and the need for further investigation.  ----------------------------------------- 5:46 PM on 05/03/2015 -----------------------------------------  Dr. Clayborn Bigness came to the emergency department and evaluated the patient in person, and I was present for their discussion.  He feels that while the EKG changes are notable, they are not specific to a cardiac cause and given the patient has, no shortness of breath, and no other chest pain, she is appropriate for outpatient follow-up.  The patient's electrolytes are within normal limits and her lab work is essentially unremarkable.  I will repeat an EKG to see if it is changing dynamically but will plan to discharge the patient for follow-up with Dr. Clayborn Bigness in clinic as per his recommendation.  The patient also understands and agrees with this plan.   ----------------------------------------- 7:03 PM on 05/03/2015 -----------------------------------------  The patient's daughter who is driving her home his back and I had another lengthy discussion with her and the patient.  I explained again that I cannot explain her EKG changes, and nor can Dr. Clayborn Bigness, but that her workup is reassuring and that Dr. Clayborn Bigness feels that she is safe and appropriate for outpatient follow-up.  I encouraged them to call his office in the morning to schedule the next available follow-up appointment.  I gave my usual and customary return precautions and explained that I cannot provide any guarantees but that to my best ability I have ruled out the acute  and emergent illnesses.  They understand and will follow up with Dr. Clayborn Bigness. ____________________________________________  FINAL CLINICAL IMPRESSION(S) / ED DIAGNOSES  Final diagnoses:  Right-sided chest wall pain  Abnormal EKG      NEW MEDICATIONS STARTED DURING THIS VISIT:  New Prescriptions   No medications on file     Modified Medications   No medications on file        Hinda Kehr,  MD 05/03/15 1905

## 2015-05-03 NOTE — ED Notes (Addendum)
Pt c/o right lateral rib pain since Sunday, states she slipped off the side of the bed landing flat on her back.. Pt had recent admission for Pnx but denies any increased SOB

## 2015-05-05 ENCOUNTER — Telehealth: Payer: Self-pay | Admitting: Internal Medicine

## 2015-05-05 NOTE — Telephone Encounter (Signed)
There are no meds for vascular dementia.  Home health is needed for what purpose? PT?

## 2015-05-05 NOTE — Telephone Encounter (Signed)
Pt daughter- Steva Ready called to stated that pt home health runs out soon and wanted to know is a order can be put in to continue the home health services. [name] stated that the patient is getting dementia and wanted to know if there is a medication that could help her or what she needs to do. Please advise. [name]

## 2015-05-05 NOTE — Telephone Encounter (Signed)
Please advise, Continuation of home health and dementia medication, Follow up appointment to discuss that?

## 2015-05-06 ENCOUNTER — Telehealth: Payer: Self-pay | Admitting: *Deleted

## 2015-05-06 NOTE — Telephone Encounter (Signed)
Please advise for extension of home health services and oxygen trial.  thanks

## 2015-05-06 NOTE — Telephone Encounter (Signed)
AGREE WITH EXTENDING HOME HEALTH FOR TWO MORE WEEKS,  AND AGREE WITH SUSPENSION OF OXYGEN TRIAL

## 2015-05-06 NOTE — Telephone Encounter (Signed)
Anna Prince from [facility] requested that patients home health be extended. She also stated that patient oxygen level is at 98 percent, while walking. She suggested patient do a trail period of time without the oxygen, to see how he do. Please Advise Contact Emily at [facility] [phone]

## 2015-05-06 NOTE — Telephone Encounter (Signed)
Called and talked to daughter on the phone.  Patient has been having [facility] come out for nursing and PT home health services.  She is due to have them discontinued today and the daughter believes she could benefit from continuation of services for a little while longer.  I told her to have the nurse and PT evaluate her today and then they can call with recommendations.   Spoke with the daughter regarding the second part of the message.  Patient is having increased confusion in the afternoons, has hallucinations  Including seeing and hearing things.  She gets very argument ive mid day also.  I had a lengthy discussion that as a caregiver for  Family that the stress of dementia can make it worse and hard to handle for her and not so much for the patient.  She agreed and just wanted to let you know that there have been changes in the past month that are hard for her to watch as her daughter.  She is used to seeing her mom sharp and on point.

## 2015-05-09 ENCOUNTER — Telehealth: Payer: Self-pay | Admitting: Internal Medicine

## 2015-05-09 NOTE — Telephone Encounter (Signed)
Nurse from [facility] returned call and gave verbal as directed for )@ and to continue therapy.

## 2015-05-09 NOTE — Telephone Encounter (Signed)
Left message for patient to return call to office. 

## 2015-05-10 ENCOUNTER — Other Ambulatory Visit: Payer: Self-pay | Admitting: Internal Medicine

## 2015-05-20 ENCOUNTER — Telehealth: Payer: Self-pay

## 2015-05-20 NOTE — Telephone Encounter (Signed)
FYIKonrad Prince from [facility] reports that pt's bp was high, running around178/96, 186/92. She has been under a large amount of stress due to family issues. She states she was recently taken off bp meds

## 2015-05-20 NOTE — Telephone Encounter (Signed)
At her last visit she was told to resume lisinopril for BP 140/80,  Has she done that? If not,  Do it.

## 2015-05-20 NOTE — Telephone Encounter (Signed)
Pt states that she was confused about taking her meds. I explained to her that you wanted to continue taking her lisinopril. Pt stated that she would and that she still had some left. Pt verbalized understanding

## 2015-05-25 DIAGNOSIS — I10 Essential (primary) hypertension: Secondary | ICD-10-CM | POA: Diagnosis not present

## 2015-05-25 DIAGNOSIS — H3530 Unspecified macular degeneration: Secondary | ICD-10-CM | POA: Diagnosis not present

## 2015-05-25 DIAGNOSIS — K2270 Barrett's esophagus without dysplasia: Secondary | ICD-10-CM | POA: Diagnosis not present

## 2015-05-25 DIAGNOSIS — J449 Chronic obstructive pulmonary disease, unspecified: Secondary | ICD-10-CM | POA: Diagnosis not present

## 2015-05-25 DIAGNOSIS — J189 Pneumonia, unspecified organism: Secondary | ICD-10-CM | POA: Diagnosis not present

## 2015-05-30 ENCOUNTER — Other Ambulatory Visit: Payer: Self-pay

## 2015-05-30 ENCOUNTER — Telehealth: Payer: Self-pay

## 2015-05-30 ENCOUNTER — Ambulatory Visit: Payer: Medicare Other | Admitting: Internal Medicine

## 2015-05-30 MED ORDER — LISINOPRIL 10 MG PO TABS: 10.0000 mg | ORAL_TABLET | Freq: Every day | ORAL | Status: DC

## 2015-05-30 NOTE — Telephone Encounter (Signed)
Lisinopril 10mg , spoke with daughter and she stated that Anna Prince is confused about medications

## 2015-05-30 NOTE — Telephone Encounter (Signed)
Pt states that she was taken off Lisinopril a couple off weeks ago. Pt states that her bp is running high 194/93., 200/100 this morning.

## 2015-05-30 NOTE — Telephone Encounter (Signed)
KC<  See my message and your conversation with thie patient about this same issue last week.  i advised her to resume the lisinopril.

## 2015-06-09 ENCOUNTER — Other Ambulatory Visit: Payer: Self-pay | Admitting: Internal Medicine

## 2015-06-10 ENCOUNTER — Ambulatory Visit (INDEPENDENT_AMBULATORY_CARE_PROVIDER_SITE_OTHER): Payer: Medicare Other | Admitting: Internal Medicine

## 2015-06-10 ENCOUNTER — Encounter: Payer: Self-pay | Admitting: Internal Medicine

## 2015-06-10 VITALS — BP 144/80 | HR 68 | Temp 98.1°F | Ht 66.0 in | Wt 137.1 lb

## 2015-06-10 DIAGNOSIS — I10 Essential (primary) hypertension: Secondary | ICD-10-CM

## 2015-06-10 DIAGNOSIS — D519 Vitamin B12 deficiency anemia, unspecified: Secondary | ICD-10-CM | POA: Diagnosis not present

## 2015-06-10 DIAGNOSIS — F0150 Vascular dementia without behavioral disturbance: Secondary | ICD-10-CM

## 2015-06-10 DIAGNOSIS — D539 Nutritional anemia, unspecified: Secondary | ICD-10-CM

## 2015-06-10 DIAGNOSIS — M25552 Pain in left hip: Secondary | ICD-10-CM

## 2015-06-10 DIAGNOSIS — J9621 Acute and chronic respiratory failure with hypoxia: Secondary | ICD-10-CM

## 2015-06-10 DIAGNOSIS — D509 Iron deficiency anemia, unspecified: Secondary | ICD-10-CM

## 2015-06-10 DIAGNOSIS — E559 Vitamin D deficiency, unspecified: Secondary | ICD-10-CM

## 2015-06-10 DIAGNOSIS — G8929 Other chronic pain: Secondary | ICD-10-CM

## 2015-06-10 DIAGNOSIS — J189 Pneumonia, unspecified organism: Secondary | ICD-10-CM | POA: Diagnosis not present

## 2015-06-10 DIAGNOSIS — J181 Lobar pneumonia, unspecified organism: Principal | ICD-10-CM

## 2015-06-10 LAB — CBC WITH DIFFERENTIAL/PLATELET
BASOS ABS: 0 10*3/uL (ref 0.0–0.1)
Basophils Relative: 0.4 % (ref 0.0–3.0)
EOS ABS: 0.1 10*3/uL (ref 0.0–0.7)
Eosinophils Relative: 1.9 % (ref 0.0–5.0)
HCT: 35.5 % — ABNORMAL LOW (ref 36.0–46.0)
Hemoglobin: 11.9 g/dL — ABNORMAL LOW (ref 12.0–15.0)
LYMPHS ABS: 1.1 10*3/uL (ref 0.7–4.0)
Lymphocytes Relative: 21 % (ref 12.0–46.0)
MCHC: 33.4 g/dL (ref 30.0–36.0)
MCV: 90.8 fl (ref 78.0–100.0)
MONOS PCT: 6.1 % (ref 3.0–12.0)
Monocytes Absolute: 0.3 10*3/uL (ref 0.1–1.0)
NEUTROS ABS: 3.6 10*3/uL (ref 1.4–7.7)
NEUTROS PCT: 70.6 % (ref 43.0–77.0)
PLATELETS: 192 10*3/uL (ref 150.0–400.0)
RBC: 3.91 Mil/uL (ref 3.87–5.11)
RDW: 14.4 % (ref 11.5–15.5)
WBC: 5.2 10*3/uL (ref 4.0–10.5)

## 2015-06-10 LAB — VITAMIN B12: VITAMIN B 12: 266 pg/mL (ref 211–911)

## 2015-06-10 LAB — IBC PANEL
Iron: 62 ug/dL (ref 42–145)
Saturation Ratios: 15.7 % — ABNORMAL LOW (ref 20.0–50.0)
TRANSFERRIN: 282 mg/dL (ref 212.0–360.0)

## 2015-06-10 LAB — FERRITIN: Ferritin: 30.9 ng/mL (ref 10.0–291.0)

## 2015-06-10 LAB — VITAMIN D 25 HYDROXY (VIT D DEFICIENCY, FRACTURES): VITD: 33.77 ng/mL (ref 30.00–100.00)

## 2015-06-10 MED ORDER — LOVASTATIN 40 MG PO TABS: 40.0000 mg | ORAL_TABLET | Freq: Every day | ORAL | Status: DC

## 2015-06-10 MED ORDER — PAROXETINE HCL 20 MG PO TABS: 20.0000 mg | ORAL_TABLET | Freq: Every day | ORAL | Status: DC

## 2015-06-10 MED ORDER — DIAZEPAM 5 MG PO TABS: 5.0000 mg | ORAL_TABLET | Freq: Two times a day (BID) | ORAL | Status: DC | PRN

## 2015-06-10 MED ORDER — MELOXICAM 15 MG PO TABS: 15.0000 mg | ORAL_TABLET | Freq: Every day | ORAL | Status: DC

## 2015-06-10 MED ORDER — MIRTAZAPINE 30 MG PO TABS: 30.0000 mg | ORAL_TABLET | Freq: Every day | ORAL | Status: DC

## 2015-06-10 NOTE — Progress Notes (Signed)
Subjective:  Patient ID: Anna Prince, female    DOB: Aug 23, 1931  Age: 82 y.o. MRN: [id number]  CC: The primary encounter diagnosis was Right lower lobe pneumonia. Diagnoses of Anemia, iron deficiency, B12 deficiency anemia, Vitamin D deficiency, Essential hypertension, Vascular dementia of acute onset without behavioral disturbance, HCAP (healthcare-associated pneumonia), Acute on chronic respiratory failure with hypoxia ([place]), Anemia associated with nutritional deficiency, unspecified nutritional anemia type, and Chronic left hip pain were also pertinent to this visit.  HPI Anna Prince presents for follow up on recent admission for right sided pneumonia in December resulting in hypoxic respiratory failure. She was treated in the ER for chest pain on January 10  After sliding off her bed and striking her chest on the side of the bed, x rays were done which showed resolving  RLL infiltrate,  No fractures.  Labs noted hgb  10 which is new.  She was  sent home .Physical therapy was extended for two weeks., until the end of January    She has not been using supplemental 02 for the last 2 weeks.   Patient was ambulated here in the office and room air saturations were 98% with walking.   She has become more dependent on her daughters for assistance with medication administration due to loss of short term memory.  She has lost 2 lbs , a total of 15 lbs since 2012.   Left hip pain: she is getting steroid injections by [facility] ; next week is going to be her third injection   Outpatient Prescriptions Prior to Visit  Medication Sig Dispense Refill  . albuterol (PROVENTIL HFA;VENTOLIN HFA) 108 (90 Base) MCG/ACT inhaler Inhale 2 puffs into the lungs every 6 (six) hours as needed for wheezing or shortness of breath.    . budesonide-formoterol (SYMBICORT) 80-4.5 MCG/ACT inhaler Inhale 2 puffs into the lungs 2 (two) times daily. 1 Inhaler 12  . lisinopril (PRINIVIL,ZESTRIL) 10 MG tablet Take 1 tablet (10  mg total) by mouth daily. 90 tablet 3  . mirtazapine (REMERON) 30 MG tablet TAKE 1 TABLET BY MOUTH EVERY NIGHT AT BEDTIME 30 tablet 0  . omeprazole (PRILOSEC) 40 MG capsule Take 1 capsule (40 mg total) by mouth daily. 90 capsule 3  . PARoxetine (PAXIL) 20 MG tablet TAKE 1 TABLET BY MOUTH EVERY DAY 30 tablet 0  . diazepam (VALIUM) 5 MG tablet Take 5 mg by mouth 2 (two) times daily as needed for anxiety.    . lovastatin (MEVACOR) 40 MG tablet TAKE 1 TABLET BY MOUTH EVERY DAY 90 tablet 3  . meloxicam (MOBIC) 15 MG tablet Take 1 tablet (15 mg total) by mouth daily. 30 tablet 5  . mirtazapine (REMERON) 30 MG tablet Take 30 mg by mouth at bedtime.    Marland Kitchen PARoxetine (PAXIL) 20 MG tablet Take 20 mg by mouth daily.    Marland Kitchen guaiFENesin (MUCINEX) 600 MG 12 hr tablet Take 1 tablet (600 mg total) by mouth 2 (two) times daily. (Patient not taking: Reported on 05/03/2015) 20 tablet 0  . HYDROcodone-acetaminophen (NORCO/VICODIN) 5-325 MG tablet Take 1 tablet by mouth every 6 (six) hours as needed for moderate pain. Reported on 06/10/2015    . nystatin (MYCOSTATIN) 100000 UNIT/ML suspension Take 5 mLs (500,000 Units total) by mouth 4 (four) times daily. (Patient not taking: Reported on 06/10/2015) 100 mL 0  . amoxicillin-clavulanate (AUGMENTIN) 500-125 MG tablet Take 1 tablet (500 mg total) by mouth 2 (two) times daily. (Patient not taking: Reported on 04/28/2015)  14 tablet 0   No facility-administered medications prior to visit.    Review of Systems;  Patient denies headache, fevers, malaise, unintentional weight loss, skin rash, eye pain, sinus congestion and sinus pain, sore throat, dysphagia,  hemoptysis , cough, dyspnea, wheezing, chest pain, palpitations, orthopnea, edema, abdominal pain, nausea, melena, diarrhea, constipation, flank pain, dysuria, hematuria, urinary  Frequency, nocturia, numbness, tingling, seizures,  Focal weakness, Loss of consciousness,  Tremor, insomnia, depression, anxiety, and suicidal  ideation.      Objective:  BP 144/80 mmHg  Pulse 68  Temp(Src) 98.1 F (36.7 C) (Oral)  Ht 5\' 6"  (1.676 m)  Wt 137 lb 2 oz (62.199 kg)  BMI 22.14 kg/m2  SpO2 98%  BP Readings from Last 3 Encounters:  06/10/15 144/80  05/03/15 132/74  04/28/15 98/60    Wt Readings from Last 3 Encounters:  06/10/15 137 lb 2 oz (62.199 kg)  05/03/15 139 lb (63.05 kg)  04/28/15 139 lb 2 oz (63.107 kg)    General appearance: alert, cooperative and appears stated age Ears: normal TM's and external ear canals both ears Throat: lips, mucosa, and tongue normal; teeth and gums normal Neck: no adenopathy, no carotid bruit, supple, symmetrical, trachea midline and thyroid not enlarged, symmetric, no tenderness/mass/nodules Back: symmetric, no curvature. ROM normal. No CVA tenderness. Lungs: clear to auscultation bilaterally Heart: regular rate and rhythm, S1, S2 normal, no murmur, click, rub or gallop Abdomen: soft, non-tender; bowel sounds normal; no masses,  no organomegaly Pulses: 2+ and symmetric Skin: Skin color, texture, turgor normal. No rashes or lesions Lymph nodes: Cervical, supraclavicular, and axillary nodes normal.  No results found for: HGBA1C  Lab Results  Component Value Date   CREATININE 1.28* 05/03/2015   CREATININE 1.05* 04/16/2015   CREATININE 1.29* 04/15/2015    Lab Results  Component Value Date   WBC 5.2 06/10/2015   HGB 11.9* 06/10/2015   HCT 35.5* 06/10/2015   PLT 192.0 06/10/2015   GLUCOSE 128* 05/03/2015   CHOL 169 08/24/2014   TRIG 91.0 08/24/2014   HDL 66.10 08/24/2014   LDLDIRECT 94.0 11/25/2014   LDLCALC 85 08/24/2014   ALT 10* 05/03/2015   AST 14* 05/03/2015   NA 137 05/03/2015   K 3.5 05/03/2015   CL 105 05/03/2015   CREATININE 1.28* 05/03/2015   BUN 21* 05/03/2015   CO2 24 05/03/2015   TSH 0.484 04/09/2015   INR 1.00 05/03/2015    Dg Chest 2 View  05/03/2015  CLINICAL DATA:  Fall, right posterior rib pain.  Recent pneumonia. EXAM: CHEST  2  VIEW COMPARISON:  04/14/2015 FINDINGS: There is hyperinflation of the lungs compatible with COPD. Improving right lower lobe airspace disease. Small hiatal hernia. Heart is upper limits normal in size. No confluent airspace opacities or effusions. No acute bony abnormality. No visible displaced rib fracture or pneumothorax. IMPRESSION: COPD/chronic changes.  No active disease. Electronically Signed   By: Rolm Baptise M.D.   On: 05/03/2015 11:38    Assessment & Plan:   Problem List Items Addressed This Visit    Hypertension    Recent elevations necessitated resuming lisinopril .  No changes today .  Lab Results  Component Value Date   CREATININE 1.28* 05/03/2015   Lab Results  Component Value Date   NA 137 05/03/2015   K 3.5 05/03/2015   CL 105 05/03/2015   CO2 24 05/03/2015         Relevant Medications   lovastatin (MEVACOR) 40 MG tablet   Vascular  dementia of acute onset without behavioral disturbance    She declined trial of aricept last year.       Relevant Medications   diazepam (VALIUM) 5 MG tablet   mirtazapine (REMERON) 30 MG tablet   PARoxetine (PAXIL) 20 MG tablet   Anemia    Recent drop noted during ED evaluation was discussed.  Repeat CBC,  B12, iron  and RBC folate levels were normal.  Lab Results  Component Value Date   WBC 5.2 06/10/2015   HGB 11.9* 06/10/2015   HCT 35.5* 06/10/2015   MCV 90.8 06/10/2015   PLT 192.0 06/10/2015   Lab Results  Component Value Date   VITAMINB12 266 06/10/2015   Lab Results  Component Value Date   FERRITIN 30.9 06/10/2015         Chronic left hip pain    She has mild DJD and spurring by Dec 2015 plain films. She is unable to to ambulate safely without assistance an dis receiving intraarticular injections by [facility].       HCAP (healthcare-associated pneumonia)    repeat chest x ray due in 4 weeks to ensure resolution of RLL infiltrate/       Acute on chronic respiratory failure ([place])    She is no longer hypoxic on  room air at rest and with ambulation by ambulatory  Testing today and has been advised to discontinue the supplemental oxygen contract.       Vitamin D deficiency   Relevant Orders   VITAMIN D 25 Hydroxy (Vit-D Deficiency, Fractures) (Completed)    Other Visit Diagnoses    Right lower lobe pneumonia    -  Primary    Relevant Orders    DG Chest 2 View    Anemia, iron deficiency        Relevant Orders    CBC with Differential/Platelet (Completed)    Ferritin (Completed)    IBC panel (Completed)    B12 deficiency anemia        Relevant Orders    Vitamin B12 (Completed)    Folate RBC       I have discontinued Ms. Fambro's amoxicillin-clavulanate. I have also changed her diazepam, lovastatin, mirtazapine, and PARoxetine. Additionally, I am having her maintain her omeprazole, guaiFENesin, budesonide-formoterol, nystatin, albuterol, HYDROcodone-acetaminophen, PARoxetine, mirtazapine, lisinopril, and meloxicam.  Meds ordered this encounter  Medications  . diazepam (VALIUM) 5 MG tablet    Sig: Take 1 tablet (5 mg total) by mouth 2 (two) times daily as needed for anxiety.    Dispense:  60 tablet    Refill:  5  . lovastatin (MEVACOR) 40 MG tablet    Sig: Take 1 tablet (40 mg total) by mouth daily.    Dispense:  90 tablet    Refill:  3  . meloxicam (MOBIC) 15 MG tablet    Sig: Take 1 tablet (15 mg total) by mouth daily.    Dispense:  30 tablet    Refill:  5  . mirtazapine (REMERON) 30 MG tablet    Sig: Take 1 tablet (30 mg total) by mouth at bedtime.    Dispense:  30 tablet    Refill:  5  . PARoxetine (PAXIL) 20 MG tablet    Sig: Take 1 tablet (20 mg total) by mouth daily.    Dispense:  30 tablet    Refill:  5   A total of 40 minutes was spent with patient more than half of which was spent in counseling patient on the above  mentioned issues , reviewing and explaining recent labs and imaging studies done, and coordination of care. Medications Discontinued During This Encounter    Medication Reason  . amoxicillin-clavulanate (AUGMENTIN) 500-125 MG tablet Completed Course  . diazepam (VALIUM) 5 MG tablet Reorder  . lovastatin (MEVACOR) 40 MG tablet Reorder  . meloxicam (MOBIC) 15 MG tablet Reorder  . mirtazapine (REMERON) 30 MG tablet Reorder  . PARoxetine (PAXIL) 20 MG tablet Reorder    Follow-up: No Follow-up on file.   Crecencio Mc, MD

## 2015-06-10 NOTE — Patient Instructions (Addendum)
You are more anemic than you were in December.  I am checking your iron and B12 levels today  your oxygen level did not drop below 88% with activity,  You do not need to continue using supplemental oxygen

## 2015-06-12 LAB — FOLATE RBC: RBC Folate: >1000 ng/mL (ref >280)

## 2015-06-12 NOTE — Assessment & Plan Note (Signed)
Recent drop noted during ED evaluation was discussed.  Repeat CBC,  B12, iron  and RBC folate levels were normal.  Lab Results  Component Value Date   WBC 5.2 06/10/2015   HGB 11.9* 06/10/2015   HCT 35.5* 06/10/2015   MCV 90.8 06/10/2015   PLT 192.0 06/10/2015   Lab Results  Component Value Date   VITAMINB12 266 06/10/2015   Lab Results  Component Value Date   FERRITIN 30.9 06/10/2015

## 2015-06-12 NOTE — Assessment & Plan Note (Signed)
repeat chest x ray due in 4 weeks to ensure resolution of RLL infiltrate/

## 2015-06-12 NOTE — Assessment & Plan Note (Signed)
She is no longer hypoxic on room air at rest and with ambulation by ambulatory  Testing today and has been advised to discontinue the supplemental oxygen contract.

## 2015-06-12 NOTE — Assessment & Plan Note (Signed)
She has mild DJD and spurring by Dec 2015 plain films. She is unable to to ambulate safely without assistance and is receiving intraarticular injections by [facility].  

## 2015-06-12 NOTE — Assessment & Plan Note (Signed)
Recent elevations necessitated resuming lisinopril .  No changes today .  Lab Results  Component Value Date   CREATININE 1.28* 05/03/2015   Lab Results  Component Value Date   NA 137 05/03/2015   K 3.5 05/03/2015   CL 105 05/03/2015   CO2 24 05/03/2015

## 2015-06-12 NOTE — Assessment & Plan Note (Signed)
She declined trial of aricept last year.

## 2015-06-14 ENCOUNTER — Telehealth: Payer: Self-pay | Admitting: Internal Medicine

## 2015-06-14 NOTE — Telephone Encounter (Signed)
The letter  Was  in the chart ,  And now printed

## 2015-06-14 NOTE — Telephone Encounter (Signed)
Needing home health to pick up Oxygen tank. They will not pick up without a discharge order.

## 2015-06-14 NOTE — Telephone Encounter (Signed)
Last note from [facility] states we can discontinue testing done in office. Just need order.

## 2015-06-14 NOTE — Telephone Encounter (Signed)
Faxed letter to [facility] as requested by patient and family.

## 2015-06-24 DIAGNOSIS — M461 Sacroiliitis, not elsewhere classified: Secondary | ICD-10-CM | POA: Diagnosis not present

## 2015-07-19 ENCOUNTER — Telehealth: Payer: Self-pay | Admitting: *Deleted

## 2015-07-19 NOTE — Telephone Encounter (Signed)
Medication has already been sent to pharmacy

## 2015-07-19 NOTE — Telephone Encounter (Signed)
Medication refill request for omeprazole  Pharmacy [facility] in [place]

## 2015-07-20 ENCOUNTER — Other Ambulatory Visit: Payer: Self-pay | Admitting: Internal Medicine

## 2015-08-19 DIAGNOSIS — M5136 Other intervertebral disc degeneration, lumbar region: Secondary | ICD-10-CM | POA: Diagnosis not present

## 2015-08-19 DIAGNOSIS — M4186 Other forms of scoliosis, lumbar region: Secondary | ICD-10-CM | POA: Diagnosis not present

## 2015-09-25 ENCOUNTER — Emergency Department: Payer: Medicare Other

## 2015-09-25 ENCOUNTER — Emergency Department
Admission: EM | Admit: 2015-09-25 | Discharge: 2015-09-25 | Disposition: A | Discharge status: Home / Self Care | Payer: Medicare Other | Attending: Emergency Medicine | Admitting: Emergency Medicine

## 2015-09-25 ENCOUNTER — Encounter: Payer: Self-pay | Admitting: Emergency Medicine

## 2015-09-25 DIAGNOSIS — Z87891 Personal history of nicotine dependence: Secondary | ICD-10-CM | POA: Diagnosis not present

## 2015-09-25 DIAGNOSIS — Y999 Unspecified external cause status: Secondary | ICD-10-CM | POA: Insufficient documentation

## 2015-09-25 DIAGNOSIS — Y939 Activity, unspecified: Secondary | ICD-10-CM | POA: Diagnosis not present

## 2015-09-25 DIAGNOSIS — W01190A Fall on same level from slipping, tripping and stumbling with subsequent striking against furniture, initial encounter: Secondary | ICD-10-CM | POA: Diagnosis not present

## 2015-09-25 DIAGNOSIS — M1990 Unspecified osteoarthritis, unspecified site: Secondary | ICD-10-CM | POA: Insufficient documentation

## 2015-09-25 DIAGNOSIS — I252 Old myocardial infarction: Secondary | ICD-10-CM | POA: Diagnosis not present

## 2015-09-25 DIAGNOSIS — E785 Hyperlipidemia, unspecified: Secondary | ICD-10-CM | POA: Diagnosis not present

## 2015-09-25 DIAGNOSIS — Z8522 Personal history of malignant neoplasm of nasal cavities, middle ear, and accessory sinuses: Secondary | ICD-10-CM | POA: Diagnosis not present

## 2015-09-25 DIAGNOSIS — M25551 Pain in right hip: Secondary | ICD-10-CM | POA: Diagnosis not present

## 2015-09-25 DIAGNOSIS — F329 Major depressive disorder, single episode, unspecified: Secondary | ICD-10-CM | POA: Insufficient documentation

## 2015-09-25 DIAGNOSIS — I10 Essential (primary) hypertension: Secondary | ICD-10-CM | POA: Diagnosis not present

## 2015-09-25 DIAGNOSIS — W19XXXA Unspecified fall, initial encounter: Secondary | ICD-10-CM

## 2015-09-25 DIAGNOSIS — Y929 Unspecified place or not applicable: Secondary | ICD-10-CM | POA: Diagnosis not present

## 2015-09-25 DIAGNOSIS — M810 Age-related osteoporosis without current pathological fracture: Secondary | ICD-10-CM | POA: Diagnosis not present

## 2015-09-25 DIAGNOSIS — S79911A Unspecified injury of right hip, initial encounter: Secondary | ICD-10-CM | POA: Diagnosis not present

## 2015-09-25 DIAGNOSIS — J449 Chronic obstructive pulmonary disease, unspecified: Secondary | ICD-10-CM | POA: Insufficient documentation

## 2015-09-25 DIAGNOSIS — Z8679 Personal history of other diseases of the circulatory system: Secondary | ICD-10-CM | POA: Diagnosis not present

## 2015-09-25 DIAGNOSIS — Z79899 Other long term (current) drug therapy: Secondary | ICD-10-CM | POA: Diagnosis not present

## 2015-09-25 NOTE — ED Notes (Signed)
Sees dr Mack Guise for cortizone shots in her left hip for pain.

## 2015-09-25 NOTE — ED Notes (Addendum)
Pt states over the last year she has noticed she gets confused at times. Has not spoke with her pcp. Tripped weds and fell into the railing of the swing on her right hip. Able to ambulate. No marks, no abrasions, no bruising.

## 2015-09-25 NOTE — ED Notes (Addendum)
Patient fell on her patio striking her right hip. Patient is ambulatory in triage however complains of right hip pain. Patient's family also advises of increasing confusion for the past year. Patient is A+O X4 but family states she is having problems with long term memory recall.

## 2015-09-25 NOTE — ED Provider Notes (Signed)
[facility] Emergency Department Provider Note  ____________________________________________  Time seen: Approximately 6:27 PM  I have reviewed the triage vital signs and the nursing notes.   HISTORY  Chief Complaint Altered Mental Status and Fall    HPI Anna Prince is a 82 y.o. female who reports that she fell about 2 days ago and struck her right hip and posterior pelvis on the front porch.  He reports mild pain in her right hip that feels aching and is worse with ambulation.  She reports it was a mechanical fall and she just "got tripped up".  She did not strike her head, did not lose consciousness, and complains of no head nor neck pain.  She reports that she does not have any bruising or swelling but that her hip is hurting her and she thought she should have it checked out to make sure she did not break anything.  She is able to ambulate with no difficulty.  Denies fever/chills, CP, SOB, abd pain, dysuria.   Past Medical History  Diagnosis Date  . Barrett's esophagus 2007  . Sigmoid diverticulosis     by colonoscopy  . Aortic aneurysm ([place])   . Macular degeneration   . Anxiety   . Hyperlipidemia   . Hypertension   . Osteoporosis   . Lumbago   . Depression   . Bursitis 2012    left hip, improved with periodic steroid injection [facility] [place])  . Anemia 2012    of acute blood loss, resolved  . GERD (gastroesophageal reflux disease)     with Barretts Esophagus  . COPD (chronic obstructive pulmonary disease) ([place])   . Arthritis     Patient Active Problem List   Diagnosis Date Noted  . Acute on chronic respiratory failure ([place]) 05/01/2015  . Generalized muscle weakness 04/16/2015  . Hospital discharge follow-up 04/16/2015  . HCAP (healthcare-associated pneumonia) 04/14/2015  . NSTEMI (non-ST elevated myocardial infarction) ([place]) 04/14/2015  . Anxiety 04/14/2015  . Candida esophagitis ([place]) 02/10/2015  . AKI (acute kidney injury)  ([place]) 02/03/2015  . Underweight 11/27/2014  . Chronic left hip pain 11/27/2014  . Anemia 11/25/2014  . Elevated liver enzymes 08/25/2014  . Pubic ramus fracture ([place]) 08/25/2014  . Vascular dementia of acute onset without behavioral disturbance 02/20/2014  . Chronic pain syndrome 05/27/2013  . Tobacco abuse counseling 04/10/2013  . Skin lesion of face 02/22/2012  . GERD (gastroesophageal reflux disease)   . Screening for colon cancer 06/18/2011  . COPD (chronic obstructive pulmonary disease) ([place]) 06/18/2011  . Skin cancer of nose 12/17/2010  . Vitamin D deficiency 12/17/2010  . Hyperlipidemia LDL goal <100 12/17/2010  . Screening for malignant neoplasm of breast 12/17/2010  . Encounter for long-term (current) use of other medications 12/17/2010  . Aortic aneurysm, abdominal ([place]) 12/17/2010  . Diverticulosis, sigmoid 12/08/2010  . Hyperlipidemia 12/08/2010  . Hypertension 12/08/2010  . Lumbago 12/08/2010  . Osteoporosis 12/08/2010  . Depression 12/08/2010    Past Surgical History  Procedure Laterality Date  . Appendectomy    . Back surgery      X2..1975 arachnoid cyst cervical region([place]);1997 lumbosacral tumor,9 hr surgery  . Abdominal aortic aneurysm repair  July 2010    [facility]    Current Outpatient Rx  Name  Route  Sig  Dispense  Refill  . albuterol (PROVENTIL HFA;VENTOLIN HFA) 108 (90 Base) MCG/ACT inhaler   Inhalation   Inhale 2 puffs into the lungs every 6 (six) hours as needed  for wheezing or shortness of breath.         . budesonide-formoterol (SYMBICORT) 80-4.5 MCG/ACT inhaler   Inhalation   Inhale 2 puffs into the lungs 2 (two) times daily.   1 Inhaler   12   . diazepam (VALIUM) 5 MG tablet   Oral   Take 1 tablet (5 mg total) by mouth 2 (two) times daily as needed for anxiety.   60 tablet   5   . guaiFENesin (MUCINEX) 600 MG 12 hr tablet   Oral   Take 1 tablet (600 mg total) by mouth 2 (two) times daily. Patient not taking: Reported on  05/03/2015   20 tablet   0   . HYDROcodone-acetaminophen (NORCO/VICODIN) 5-325 MG tablet   Oral   Take 1 tablet by mouth every 6 (six) hours as needed for moderate pain. Reported on 06/10/2015         . lisinopril (PRINIVIL,ZESTRIL) 10 MG tablet   Oral   Take 1 tablet (10 mg total) by mouth daily.   90 tablet   3   . lovastatin (MEVACOR) 40 MG tablet   Oral   Take 1 tablet (40 mg total) by mouth daily.   90 tablet   3   . meloxicam (MOBIC) 15 MG tablet   Oral   Take 1 tablet (15 mg total) by mouth daily.   30 tablet   5   . mirtazapine (REMERON) 30 MG tablet      TAKE 1 TABLET BY MOUTH EVERY NIGHT AT BEDTIME   30 tablet   0   . mirtazapine (REMERON) 30 MG tablet   Oral   Take 1 tablet (30 mg total) by mouth at bedtime.   30 tablet   5   . nystatin (MYCOSTATIN) 100000 UNIT/ML suspension   Oral   Take 5 mLs (500,000 Units total) by mouth 4 (four) times daily. Patient not taking: Reported on 06/10/2015   100 mL   0   . omeprazole (PRILOSEC) 40 MG capsule      TAKE 1 CAPSULE BY MOUTH DAILY.   90 capsule   2   . PARoxetine (PAXIL) 20 MG tablet      TAKE 1 TABLET BY MOUTH EVERY DAY   30 tablet   0   . PARoxetine (PAXIL) 20 MG tablet   Oral   Take 1 tablet (20 mg total) by mouth daily.   30 tablet   5     Allergies Morphine sulfate  Family History  Problem Relation Age of Onset  . Coronary artery disease Mother   . Coronary artery disease Father 74  . Cancer Neg Hx     no breast,coon or ovarian ca    Social History Social History  Substance Use Topics  . Smoking status: Former Smoker -- 0.50 packs/day    Types: Cigarettes  . Smokeless tobacco: Never Used     Comment: HAS BEEN SMOKING 30+ YEARS,RESUMED TOBACCO USE AFTER AAA REPAIR  . Alcohol Use: No    Review of Systems Constitutional: No fever/chills Eyes: No visual changes. ENT: No sore throat. Cardiovascular: Denies chest pain. Respiratory: Denies shortness of  breath. Gastrointestinal: No abdominal pain.  No nausea, no vomiting.  No diarrhea.  No constipation. Genitourinary: Negative for dysuria. Musculoskeletal: Chronic lower back pain.  Pain in her right hip with weightbearing and ambulation although it is mild Skin: Negative for rash. Neurological: Negative for headaches, focal weakness or numbness.  10-point ROS otherwise negative.  ____________________________________________  PHYSICAL EXAM:  VITAL SIGNS: ED Triage Vitals  Enc Vitals Group     BP 09/25/15 1726 136/69 mmHg     Pulse Rate 09/25/15 1726 74     Resp 09/25/15 1726 18     Temp 09/25/15 1726 98.7 F (37.1 C)     Temp Source 09/25/15 1726 Oral     SpO2 09/25/15 1726 97 %     Weight 09/25/15 1726 135 lb (61.236 kg)     Height 09/25/15 1726 5\' 6"  (1.676 m)     Head Cir --      Peak Flow --      Pain Score --      Pain Loc --      Pain Edu? --      Excl. in [place]? --     Constitutional: Alert and oriented. Well appearing and in no acute distress. Eyes: Conjunctivae are normal. PERRL. EOMI. Head: Atraumatic. Nose: No congestion/rhinnorhea. Mouth/Throat: Mucous membranes are moist.  Oropharynx non-erythematous. Neck: No stridor.  No meningeal signs.  No cervical spine tenderness to palpation. Cardiovascular: Normal rate, regular rhythm. Good peripheral circulation. Grossly normal heart sounds.   Respiratory: Normal respiratory effort.  No retractions. Lungs CTAB. Gastrointestinal: Soft and nontender. No distention.  Musculoskeletal: No vertebral tenderness to palpation all the way down the length back.  She has some point tenderness in the right side of her pelvis but her pelvis is stable.  She ambulates without difficulty.  No bruising nor hematoma.  No lower extremity tenderness nor edema. No gross deformities of extremities. Neurologic:  Normal speech and language. No gross focal neurologic deficits are appreciated.  Skin:  Skin is warm, dry and intact. No rash  noted. Psychiatric: Mood and affect are normal. Speech and behavior are normal.  ____________________________________________   LABS (all labs ordered are listed, but only abnormal results are displayed)  Labs Reviewed - No data to display ____________________________________________  EKG  None ____________________________________________  RADIOLOGY   Dg Hip Unilat With Pelvis 2-3 Views Right  09/25/2015  CLINICAL DATA:  82 year old female fall and right hip pain. EXAM: DG HIP (WITH OR WITHOUT PELVIS) 2-3V RIGHT COMPARISON:  None. FINDINGS: There is advanced osteopenia which limits evaluation for fracture. No definite acute fracture. CT or MRI may provide better evaluation if there is high clinical concern for fracture. There is no dislocation. There is moderate bilateral osteoarthritic changes of the hips. There is vascular calcification. The soft tissues are grossly unremarkable. IMPRESSION: No definite acute fracture or dislocation. Electronically Signed   By: Anner Crete M.D.   On: 09/25/2015 19:07    ____________________________________________   PROCEDURES  Procedure(s) performed: None  Critical Care performed: No ____________________________________________   INITIAL IMPRESSION / ASSESSMENT AND PLAN / ED COURSE  Pertinent labs & imaging results that were available during my care of the patient were reviewed by me and considered in my medical decision making (see chart for details).  Unremarkable exam and radiographs.  Patient ambulating without difficulty. Stable with normal mental status throughout stay in Emergency Department.  No indication for further workup, and the patient agreed and did not want additional studies including blood work.  I gave my usual and customary return precautions.  Patient left ambulating without difficulty.   ____________________________________________  FINAL CLINICAL IMPRESSION(S) / ED DIAGNOSES  Final diagnoses:  Right hip  pain  Fall, initial encounter     MEDICATIONS GIVEN DURING THIS VISIT:  Medications - No data to display   NEW OUTPATIENT MEDICATIONS  STARTED DURING THIS VISIT:  Discharge Medication List as of 09/25/2015  8:31 PM        Note:  This document was prepared using Dragon voice recognition software and may include unintentional dictation errors.   Hinda Kehr, MD 09/25/15 2250

## 2015-09-25 NOTE — Discharge Instructions (Signed)
You have been seen in the Emergency Department (ED) today for a fall.  Your work up does not show any concerning injuries.  Please take over-the-counter ibuprofen and/or Tylenol as needed for your pain (unless you have an allergy or your doctor as told you not to take them), or take any prescribed medication as instructed.  Please follow up with your doctor regarding today's Emergency Department (ED) visit and your recent fall.    Return to the ED if you have any headache, confusion, slurred speech, weakness/numbness of any arm or leg, or any increased pain.   Hip Pain Your hip is the joint between your upper legs and your lower pelvis. The bones, cartilage, tendons, and muscles of your hip joint perform a lot of work each day supporting your body weight and allowing you to move around. Hip pain can range from a minor ache to severe pain in one or both of your hips. Pain may be felt on the inside of the hip joint near the groin, or the outside near the buttocks and upper thigh. You may have swelling or stiffness as well.  HOME CARE INSTRUCTIONS   Take medicines only as directed by your health care provider.  Apply ice to the injured area:  Put ice in a plastic bag.  Place a towel between your skin and the bag.  Leave the ice on for 15-20 minutes at a time, 3-4 times a day.  Keep your leg raised (elevated) when possible to lessen swelling.  Avoid activities that cause pain.  Follow specific exercises as directed by your health care provider.  Sleep with a pillow between your legs on your most comfortable side.  Record how often you have hip pain, the location of the pain, and what it feels like. SEEK MEDICAL CARE IF:   You are unable to put weight on your leg.  Your hip is red or swollen or very tender to touch.  Your pain or swelling continues or worsens after 1 week.  You have increasing difficulty walking.  You have a fever. SEEK IMMEDIATE MEDICAL CARE IF:   You have  fallen.  You have a sudden increase in pain and swelling in your hip. MAKE SURE YOU:   Understand these instructions.  Will watch your condition.  Will get help right away if you are not doing well or get worse.   This information is not intended to replace advice given to you by your health care provider. Make sure you discuss any questions you have with your health care provider.   Document Released: 09/27/2009 Document Revised: 04/30/2014 Document Reviewed: 12/04/2012 Elsevier Interactive Patient Education [employer].

## 2015-10-03 ENCOUNTER — Ambulatory Visit: Payer: Medicare Other | Admitting: Internal Medicine

## 2015-10-05 ENCOUNTER — Encounter: Payer: Self-pay | Admitting: Internal Medicine

## 2015-10-05 ENCOUNTER — Ambulatory Visit (INDEPENDENT_AMBULATORY_CARE_PROVIDER_SITE_OTHER): Payer: Medicare Other | Admitting: Internal Medicine

## 2015-10-05 VITALS — BP 130/62 | HR 82 | Temp 97.9°F | Resp 12 | Ht 66.0 in | Wt 134.5 lb

## 2015-10-05 DIAGNOSIS — E785 Hyperlipidemia, unspecified: Secondary | ICD-10-CM

## 2015-10-05 DIAGNOSIS — R5383 Other fatigue: Secondary | ICD-10-CM | POA: Diagnosis not present

## 2015-10-05 DIAGNOSIS — E559 Vitamin D deficiency, unspecified: Secondary | ICD-10-CM

## 2015-10-05 DIAGNOSIS — M8580 Other specified disorders of bone density and structure, unspecified site: Secondary | ICD-10-CM | POA: Diagnosis not present

## 2015-10-05 DIAGNOSIS — F05 Delirium due to known physiological condition: Secondary | ICD-10-CM

## 2015-10-05 DIAGNOSIS — F0150 Vascular dementia without behavioral disturbance: Secondary | ICD-10-CM

## 2015-10-05 DIAGNOSIS — M25551 Pain in right hip: Secondary | ICD-10-CM

## 2015-10-05 DIAGNOSIS — M810 Age-related osteoporosis without current pathological fracture: Secondary | ICD-10-CM

## 2015-10-05 DIAGNOSIS — G894 Chronic pain syndrome: Secondary | ICD-10-CM

## 2015-10-05 DIAGNOSIS — G8929 Other chronic pain: Secondary | ICD-10-CM

## 2015-10-05 MED ORDER — HYDROCODONE-ACETAMINOPHEN 5-325 MG PO TABS: 1.0000 | ORAL_TABLET | Freq: Four times a day (QID) | ORAL | Status: DC | PRN

## 2015-10-05 NOTE — Progress Notes (Signed)
Subjective:  Patient ID: Anna Prince, female    DOB: 06-20-1931  Age: 82 y.o. MRN: [id number]  CC: The primary encounter diagnosis was Other fatigue. Diagnoses of Hyperlipidemia, Vitamin D deficiency, Osteopenia determined by x-ray, Chronic pain of right hip, Vascular dementia of acute onset without behavioral disturbance, Chronic pain syndrome, Acute confusional state, and Osteoporosis were also pertinent to this visit.  HPI Anna Prince presents for ER follow up . Patient went to ER on June 4th with a 2 day history of persistent right hip and pelvis pain after falling on her front porch and striking her hip on the floor. Right hip and pelvic films revealed no fractures but advanced osteopenia and bilateral OA hips along with vascular calcification. She was ambulating fine with a walker.     Daughter [name] is her primary caregiver, is  present with her today and concerned that patient is having progressive  cognitive decline.   She is sleeping more and forgetful of her medication. When she takes a nap in the afternoon she wakes up confused and disoriented and becomes argumentative.  Has had to use oxygen in the past.  She has not had a sleep study to see if she is hypoxic. She continues to smoke , but not inside the house.   [name] is worried about her ability to continue caring for her mother because she has advanced CAD.  She wants to move to [place], but her mother does not want to relocate to [place] and the other children do not participate or help. .   Fall reviewed. .  She states that the fall was purely mechanical and not due to dizziness.  She continues to have pain in the right hip.  Previously she was having left hip pain that improved with an intraarticular steroid injection shots .  Right hip and lower back pain  is constant .She is not currently using narcotics .  Needs to see [place] of home reviewed.  Her home has a small shower with a stool .  It does not have  a step into it .  It  Does not have a bar on the wall because it is too small.  She does not shower unless someone is in the home with her.      Outpatient Prescriptions Prior to Visit  Medication Sig Dispense Refill  . albuterol (PROVENTIL HFA;VENTOLIN HFA) 108 (90 Base) MCG/ACT inhaler Inhale 2 puffs into the lungs every 6 (six) hours as needed for wheezing or shortness of breath.    . budesonide-formoterol (SYMBICORT) 80-4.5 MCG/ACT inhaler Inhale 2 puffs into the lungs 2 (two) times daily. 1 Inhaler 12  . diazepam (VALIUM) 5 MG tablet Take 1 tablet (5 mg total) by mouth 2 (two) times daily as needed for anxiety. 60 tablet 5  . guaiFENesin (MUCINEX) 600 MG 12 hr tablet Take 1 tablet (600 mg total) by mouth 2 (two) times daily. 20 tablet 0  . lisinopril (PRINIVIL,ZESTRIL) 10 MG tablet Take 1 tablet (10 mg total) by mouth daily. 90 tablet 3  . lovastatin (MEVACOR) 40 MG tablet Take 1 tablet (40 mg total) by mouth daily. 90 tablet 3  . meloxicam (MOBIC) 15 MG tablet Take 1 tablet (15 mg total) by mouth daily. 30 tablet 5  . mirtazapine (REMERON) 30 MG tablet TAKE 1 TABLET BY MOUTH EVERY NIGHT AT BEDTIME 30 tablet 0  . omeprazole (PRILOSEC) 40 MG capsule TAKE 1 CAPSULE BY MOUTH DAILY. 90 capsule  2  . PARoxetine (PAXIL) 20 MG tablet TAKE 1 TABLET BY MOUTH EVERY DAY 30 tablet 0  . mirtazapine (REMERON) 30 MG tablet Take 1 tablet (30 mg total) by mouth at bedtime. 30 tablet 5  . nystatin (MYCOSTATIN) 100000 UNIT/ML suspension Take 5 mLs (500,000 Units total) by mouth 4 (four) times daily. (Patient not taking: Reported on 06/10/2015) 100 mL 0  . PARoxetine (PAXIL) 20 MG tablet Take 1 tablet (20 mg total) by mouth daily. 30 tablet 5  . HYDROcodone-acetaminophen (NORCO/VICODIN) 5-325 MG tablet Take 1 tablet by mouth every 6 (six) hours as needed for moderate pain. Reported on 10/05/2015     No facility-administered medications prior to visit.    Review of Systems;  Patient denies headache, fevers,  malaise, unintentional weight loss, skin rash, eye pain, sinus congestion and sinus pain, sore throat, dysphagia,  hemoptysis , cough, dyspnea, wheezing, chest pain, palpitations, orthopnea, edema, abdominal pain, nausea, melena, diarrhea, constipation, flank pain, dysuria, hematuria, urinary  Frequency, nocturia, numbness, tingling, seizures,  Focal weakness, Loss of consciousness,  Tremor, insomnia, depression, anxiety, and suicidal ideation.      Objective:  BP 130/62 mmHg  Pulse 82  Temp(Src) 97.9 F (36.6 C) (Oral)  Resp 12  Ht 5\' 6"  (1.676 m)  Wt 134 lb 8 oz (61.009 kg)  BMI 21.72 kg/m2  SpO2 97%  BP Readings from Last 3 Encounters:  10/05/15 130/62  09/25/15 160/72  06/10/15 144/80    Wt Readings from Last 3 Encounters:  10/05/15 134 lb 8 oz (61.009 kg)  09/25/15 135 lb (61.236 kg)  06/10/15 137 lb 2 oz (62.199 kg)    General appearance: alert, cooperative and appears stated age Ears: normal TM's and external ear canals both ears Throat: lips, mucosa, and tongue normal; teeth and gums normal Neck: no adenopathy, no carotid bruit, supple, symmetrical, trachea midline and thyroid not enlarged, symmetric, no tenderness/mass/nodules Back: symmetric, no curvature. ROM normal. No CVA tenderness. Lungs: clear to auscultation bilaterally Heart: regular rate and rhythm, S1, S2 normal, no murmur, click, rub or gallop Abdomen: soft, non-tender; bowel sounds normal; no masses,  no organomegaly Pulses: 2+ and symmetric Skin: Skin color, texture, turgor normal. No rashes or lesions Lymph nodes: Cervical, supraclavicular, and axillary nodes normal.  No results found for: HGBA1C  Lab Results  Component Value Date   CREATININE 1.28* 05/03/2015   CREATININE 1.05* 04/16/2015   CREATININE 1.29* 04/15/2015    Lab Results  Component Value Date   WBC 5.2 06/10/2015   HGB 11.9* 06/10/2015   HCT 35.5* 06/10/2015   PLT 192.0 06/10/2015   GLUCOSE 128* 05/03/2015   CHOL 169  08/24/2014   TRIG 91.0 08/24/2014   HDL 66.10 08/24/2014   LDLDIRECT 94.0 11/25/2014   LDLCALC 85 08/24/2014   ALT 10* 05/03/2015   AST 14* 05/03/2015   NA 137 05/03/2015   K 3.5 05/03/2015   CL 105 05/03/2015   CREATININE 1.28* 05/03/2015   BUN 21* 05/03/2015   CO2 24 05/03/2015   TSH 0.484 04/09/2015   INR 1.00 05/03/2015    Dg Hip Unilat With Pelvis 2-3 Views Right  09/25/2015  CLINICAL DATA:  82 year old female fall and right hip pain. EXAM: DG HIP (WITH OR WITHOUT PELVIS) 2-3V RIGHT COMPARISON:  None. FINDINGS: There is advanced osteopenia which limits evaluation for fracture. No definite acute fracture. CT or MRI may provide better evaluation if there is high clinical concern for fracture. There is no dislocation. There is moderate bilateral osteoarthritic  changes of the hips. There is vascular calcification. The soft tissues are grossly unremarkable. IMPRESSION: No definite acute fracture or dislocation. Electronically Signed   By: Anner Crete M.D.   On: 09/25/2015 19:07    Assessment & Plan:   Problem List Items Addressed This Visit    Osteoporosis    Will repeat DEXA and start process for Prolia.  prior trial of alendronate was stopped during admission for acute kidney failure,       Chronic pain syndrome    Will resume use of vicodin max 2 daily.       Vascular dementia of acute onset without behavioral disturbance    longterm care discussed,  She is not willing to move to [place] with her daughter and her daughter vowed not to place her in assisted living      Acute confusional state    She is waking up confused , which may be due to hypoxemia while sleeping.  Overnight sleep study ordered.        Hyperlipidemia   Relevant Orders   Lipid panel   Vitamin D deficiency   Relevant Orders   VITAMIN D 25 Hydroxy (Vit-D Deficiency, Fractures)    Other Visit Diagnoses    Other fatigue    -  Primary    Relevant Orders    Comprehensive metabolic panel     CBC with Differential/Platelet    TSH    Osteopenia determined by x-ray        Relevant Orders    DG Bone Density    Chronic pain of right hip        Relevant Orders    Ambulatory referral to Orthopedic Surgery      A total of 25 minutes of face to face time was spent with patient more than half of which was spent in counselling about the above mentioned conditions  and coordination of care  I have changed Ms. Dentler's HYDROcodone-acetaminophen. I am also having her maintain her guaiFENesin, budesonide-formoterol, nystatin, albuterol, PARoxetine, mirtazapine, lisinopril, diazepam, lovastatin, meloxicam, mirtazapine, PARoxetine, and omeprazole.  Meds ordered this encounter  Medications  . HYDROcodone-acetaminophen (NORCO/VICODIN) 5-325 MG tablet    Sig: Take 1 tablet by mouth every 6 (six) hours as needed for moderate pain. Maximum 2 daily    Dispense:  60 tablet    Refill:  0    Medications Discontinued During This Encounter  Medication Reason  . HYDROcodone-acetaminophen (NORCO/VICODIN) 5-325 MG tablet Reorder    Follow-up: No Follow-up on file.   Crecencio Mc, MD

## 2015-10-05 NOTE — Progress Notes (Signed)
Referral to [facility] has been faxed and completed, also completed referral to [facility] for overnight pulse oximetry.

## 2015-10-05 NOTE — Patient Instructions (Signed)
  I have refilled the hydrocodone for use up to 2 times daily ,.    I am in the process of ordering the following:   Referral to Dr Cindi Carbon for the right hip  Home Sleep study to rule out low oxygen levels at night   [facility] referral to see if we can get you more help at home    Return in one month for fasting labs

## 2015-10-05 NOTE — Progress Notes (Signed)
Pre-visit discussion using our clinic review tool. No additional management support is needed unless otherwise documented below in the visit note.  

## 2015-10-06 DIAGNOSIS — F05 Delirium due to known physiological condition: Secondary | ICD-10-CM | POA: Insufficient documentation

## 2015-10-06 NOTE — Assessment & Plan Note (Signed)
Will resume use of vicodin max 2 daily.

## 2015-10-06 NOTE — Assessment & Plan Note (Signed)
She is waking up confused , which may be due to hypoxemia while sleeping.  Overnight sleep study ordered.

## 2015-10-06 NOTE — Assessment & Plan Note (Addendum)
Will repeat DEXA and start process for Prolia.  prior trial of alendronate was stopped during admission for acute kidney failure,

## 2015-10-06 NOTE — Assessment & Plan Note (Signed)
longterm care discussed,  She is not willing to move to [place] with her daughter and her daughter vowed not to place her in assisted living

## 2015-10-10 DIAGNOSIS — M25551 Pain in right hip: Secondary | ICD-10-CM | POA: Diagnosis not present

## 2015-10-12 DIAGNOSIS — J449 Chronic obstructive pulmonary disease, unspecified: Secondary | ICD-10-CM | POA: Diagnosis not present

## 2015-10-13 ENCOUNTER — Telehealth: Payer: Self-pay | Admitting: Internal Medicine

## 2015-10-13 DIAGNOSIS — F05 Delirium due to known physiological condition: Secondary | ICD-10-CM

## 2015-10-13 NOTE — Telephone Encounter (Signed)
Overnight pulse oximetry study ruled out hypoxia as a cause for  confusion . She spent less than  1 minute  With oxygen saturations less than 88%

## 2015-10-18 NOTE — Telephone Encounter (Signed)
Patient notified and voiced understanding.

## 2015-10-20 ENCOUNTER — Encounter: Payer: Self-pay | Admitting: Internal Medicine

## 2015-10-21 ENCOUNTER — Other Ambulatory Visit: Payer: Self-pay | Admitting: *Deleted

## 2015-10-21 NOTE — Patient Outreach (Signed)
[facility]) Care Management  10/21/2015  Anna Prince 28-Aug-1931 [id number]   Subjective: Telephone call to patient's home number, no answer, left HIPAA compliant voicemail message, and requested call back.  Objective: Per chart review: Patient had ED visit on 09/25/15 for altered mental status, status post fall, and right hip pain.  Patient also has a history of  COPD and  hypertension.   Assessment: Received MD referral on 10/05/15.   Referral source: Kerin Salen [phone]).    Referral reason: Falls and Vascular Dementia.   Telephone screen, pending patient contact.  Plan: RNCM will call patient for 2nd telephone outreach attempt, telephone screen, within 10 business days, if no return call.  Amera Banos H. Annia Friendly, BSN, [place] Management [facility] Telephonic CM Phone: [phone] Fax: [phone]

## 2015-10-26 ENCOUNTER — Other Ambulatory Visit: Payer: Self-pay | Admitting: *Deleted

## 2015-10-26 DIAGNOSIS — I10 Essential (primary) hypertension: Secondary | ICD-10-CM

## 2015-10-26 NOTE — Patient Outreach (Signed)
[facility]) Care Management  10/26/2015  Anna Prince Oct 02, 1931 [id number]   Subjective: Received voicemail message from patient, states she is returning call, and requested call back.    Telephone call to patient's home number, spoke with patient, and HIPAA verified.  Patient states she is doing well.   Patient gave [facility] verbal authorization to speak with her daughter Anna Prince) regarding her healthcare needs as needed.  Discussed [facility] Care Management services and patient in agreement to telephone screen completion. Spoke with patient and patient's daughter who state that patient does not have any disease education, disease monitoring, medication, or transportation needs at this time. Patient's daughter states she is in need of community resources for respite care for patient.   Daughter states she has had 2 heart attacks and feel that she could benefit from the services.   Daughter states she very tired and needs a periodic break in caring for patient.    Daughter in agreement with referral to [place] Management Social Worker on patient's behalf for caregiver respite community resources.   Patient states her daughter Anna Prince) is her primary caregiver, fills her pill box, cleans house, and cooks meals as needed.  Patient will continue to receive [place] Management services.   Objective: Per chart review: Patient had ED visit on 09/25/15 for altered mental status, status post fall, and right hip pain. Patient also has a history of COPD and hypertension.   Assessment: Received MD referral on 10/05/15. Referral source: Kerin Salen [phone]). Referral reason: Falls and Vascular Dementia. Telephone screen, completed.   Patient has no Telephonic Care Management needs at this time.   Patient will continue receive [place] Management services.   Plan: RNCM will refer patient to [place] Management Social Worker on patient's behalf for caregiver respite community  resources.    Tiarrah Saville H. Annia Friendly, BSN, [place] Management [facility] Telephonic CM Phone: [phone] Fax: [phone]

## 2015-10-27 ENCOUNTER — Other Ambulatory Visit: Payer: Self-pay | Admitting: Internal Medicine

## 2015-10-28 ENCOUNTER — Other Ambulatory Visit: Payer: Self-pay | Admitting: *Deleted

## 2015-10-28 NOTE — Patient Outreach (Addendum)
[facility]) Care Management  10/28/2015  Anna Prince 1931-10-23 [id number]   Phone call to patient's daughter to provide resources for respite care.  Per patient's daughter, she is the primary caregiver and rarely has time for herself.  Patient's daughter discussed that taking time out for herself has been strongly suggested however patient "will not go for it".  Patient's daughter plans to have a family session with patient and her psychologist to discuss the importance of taking time for oneself.  Per patient's daughter, they cannot afford to privately pay for in home personal care services.  This social worker allowed patient's daughter to vent her frustrations, strongly suggested and discussed the benefits of self care.  Community resources provided for respite care through [employer] [phone].  Patient's daughter also given phone number to Dementia Specialist Lutricia Horsfall [phone] for additional community resource information, counseling and support.   Adult Day Care also suggested as an option, to increase patient's socialization and to give daughter a break durring the day. Per patient's daughter, she has tried this in the past and patient has refused.  Phone number provided to [place] [phone]. Patient's daughter verbalized no further social work needs and agrees to follow up with the resources provided.   Anna Prince [facility] Care Management [phone]

## 2015-11-04 ENCOUNTER — Other Ambulatory Visit (INDEPENDENT_AMBULATORY_CARE_PROVIDER_SITE_OTHER): Payer: Medicare Other

## 2015-11-04 DIAGNOSIS — E559 Vitamin D deficiency, unspecified: Secondary | ICD-10-CM

## 2015-11-04 DIAGNOSIS — R5383 Other fatigue: Secondary | ICD-10-CM

## 2015-11-04 DIAGNOSIS — E785 Hyperlipidemia, unspecified: Secondary | ICD-10-CM

## 2015-11-04 LAB — COMPREHENSIVE METABOLIC PANEL
ALT: 12 U/L (ref 0–35)
AST: 16 U/L (ref 0–37)
Albumin: 3.9 g/dL (ref 3.5–5.2)
Alkaline Phosphatase: 74 U/L (ref 39–117)
BILIRUBIN TOTAL: 0.5 mg/dL (ref 0.2–1.2)
BUN: 21 mg/dL (ref 6–23)
CALCIUM: 9.7 mg/dL (ref 8.4–10.5)
CO2: 26 meq/L (ref 19–32)
CREATININE: 1.19 mg/dL (ref 0.40–1.20)
Chloride: 106 mEq/L (ref 96–112)
GFR: 46.19 mL/min — ABNORMAL LOW (ref >60.00)
Glucose, Bld: 108 mg/dL — ABNORMAL HIGH (ref 70–99)
Potassium: 5.1 mEq/L (ref 3.5–5.1)
SODIUM: 140 meq/L (ref 135–145)
Total Protein: 6.6 g/dL (ref 6.0–8.3)

## 2015-11-04 LAB — CBC WITH DIFFERENTIAL/PLATELET
BASOS ABS: 0 10*3/uL (ref 0.0–0.1)
BASOS PCT: 0.4 % (ref 0.0–3.0)
EOS ABS: 0.1 10*3/uL (ref 0.0–0.7)
Eosinophils Relative: 3.1 % (ref 0.0–5.0)
HEMATOCRIT: 37.5 % (ref 36.0–46.0)
HEMOGLOBIN: 12.3 g/dL (ref 12.0–15.0)
LYMPHS PCT: 19.4 % (ref 12.0–46.0)
Lymphs Abs: 0.9 10*3/uL (ref 0.7–4.0)
MCHC: 33 g/dL (ref 30.0–36.0)
MCV: 90.2 fl (ref 78.0–100.0)
Monocytes Absolute: 0.4 10*3/uL (ref 0.1–1.0)
Monocytes Relative: 8 % (ref 3.0–12.0)
Neutro Abs: 3.2 10*3/uL (ref 1.4–7.7)
Neutrophils Relative %: 69.1 % (ref 43.0–77.0)
PLATELETS: 191 10*3/uL (ref 150.0–400.0)
RBC: 4.15 Mil/uL (ref 3.87–5.11)
RDW: 15.2 % (ref 11.5–15.5)
WBC: 4.7 10*3/uL (ref 4.0–10.5)

## 2015-11-04 LAB — LIPID PANEL
CHOL/HDL RATIO: 3
CHOLESTEROL: 209 mg/dL — AB (ref 0–200)
HDL: 72.9 mg/dL (ref >39.00)
LDL CALC: 112 mg/dL — AB (ref 0–99)
NONHDL: 136.23
Triglycerides: 120 mg/dL (ref 0.0–149.0)
VLDL: 24 mg/dL (ref 0.0–40.0)

## 2015-11-04 LAB — TSH: TSH: 0.44 u[IU]/mL (ref 0.35–4.50)

## 2015-11-04 LAB — VITAMIN D 25 HYDROXY (VIT D DEFICIENCY, FRACTURES): VITD: 32.49 ng/mL (ref 30.00–100.00)

## 2015-11-08 ENCOUNTER — Other Ambulatory Visit: Payer: Self-pay | Admitting: Internal Medicine

## 2015-11-08 MED ORDER — ATORVASTATIN CALCIUM 40 MG PO TABS: 40.0000 mg | ORAL_TABLET | Freq: Every day | ORAL | Status: DC

## 2015-11-11 ENCOUNTER — Emergency Department: Payer: Medicare Other

## 2015-11-11 ENCOUNTER — Emergency Department
Admission: EM | Admit: 2015-11-11 | Discharge: 2015-11-11 | Disposition: A | Discharge status: Home / Self Care | Payer: Medicare Other | Attending: Emergency Medicine | Admitting: Emergency Medicine

## 2015-11-11 DIAGNOSIS — N390 Urinary tract infection, site not specified: Secondary | ICD-10-CM | POA: Diagnosis not present

## 2015-11-11 DIAGNOSIS — J441 Chronic obstructive pulmonary disease with (acute) exacerbation: Secondary | ICD-10-CM | POA: Insufficient documentation

## 2015-11-11 DIAGNOSIS — R42 Dizziness and giddiness: Secondary | ICD-10-CM | POA: Diagnosis present

## 2015-11-11 DIAGNOSIS — I10 Essential (primary) hypertension: Secondary | ICD-10-CM | POA: Diagnosis not present

## 2015-11-11 DIAGNOSIS — F329 Major depressive disorder, single episode, unspecified: Secondary | ICD-10-CM | POA: Insufficient documentation

## 2015-11-11 DIAGNOSIS — Z7951 Long term (current) use of inhaled steroids: Secondary | ICD-10-CM | POA: Insufficient documentation

## 2015-11-11 DIAGNOSIS — F17210 Nicotine dependence, cigarettes, uncomplicated: Secondary | ICD-10-CM | POA: Insufficient documentation

## 2015-11-11 DIAGNOSIS — E785 Hyperlipidemia, unspecified: Secondary | ICD-10-CM | POA: Insufficient documentation

## 2015-11-11 DIAGNOSIS — R51 Headache: Secondary | ICD-10-CM | POA: Diagnosis not present

## 2015-11-11 DIAGNOSIS — Z79899 Other long term (current) drug therapy: Secondary | ICD-10-CM | POA: Insufficient documentation

## 2015-11-11 DIAGNOSIS — R05 Cough: Secondary | ICD-10-CM | POA: Diagnosis not present

## 2015-11-11 LAB — BASIC METABOLIC PANEL
ANION GAP: 4 — AB (ref 5–15)
BUN: 22 mg/dL — AB (ref 6–20)
CHLORIDE: 107 mmol/L (ref 101–111)
CO2: 26 mmol/L (ref 22–32)
Calcium: 9.4 mg/dL (ref 8.9–10.3)
Creatinine, Ser: 1.05 mg/dL — ABNORMAL HIGH (ref 0.44–1.00)
GFR calc Af Amer: 56 mL/min — ABNORMAL LOW (ref >60)
GFR, EST NON AFRICAN AMERICAN: 48 mL/min — AB (ref >60)
GLUCOSE: 107 mg/dL — AB (ref 65–99)
POTASSIUM: 4.3 mmol/L (ref 3.5–5.1)
Sodium: 137 mmol/L (ref 135–145)

## 2015-11-11 LAB — URINALYSIS COMPLETE WITH MICROSCOPIC (ARMC ONLY)
Bilirubin Urine: NEGATIVE
Glucose, UA: NEGATIVE mg/dL
KETONES UR: NEGATIVE mg/dL
NITRITE: NEGATIVE
PH: 6 (ref 5.0–8.0)
PROTEIN: 30 mg/dL — AB
Specific Gravity, Urine: 1.014 (ref 1.005–1.030)

## 2015-11-11 LAB — CBC
HEMATOCRIT: 36.2 % (ref 35.0–47.0)
HEMOGLOBIN: 12.4 g/dL (ref 12.0–16.0)
MCH: 31.2 pg (ref 26.0–34.0)
MCHC: 34.4 g/dL (ref 32.0–36.0)
MCV: 90.5 fL (ref 80.0–100.0)
Platelets: 188 10*3/uL (ref 150–440)
RBC: 3.99 MIL/uL (ref 3.80–5.20)
RDW: 14.8 % — ABNORMAL HIGH (ref 11.5–14.5)
WBC: 4.3 10*3/uL (ref 3.6–11.0)

## 2015-11-11 LAB — TROPONIN I: Troponin I: <0.03 ng/mL (ref <0.03)

## 2015-11-11 MED ORDER — PREDNISONE 20 MG PO TABS: 40.0000 mg | ORAL_TABLET | Freq: Every day | ORAL | Status: DC

## 2015-11-11 MED ORDER — IPRATROPIUM BROMIDE 0.02 % IN SOLN
0.5000 mg | Freq: Once | RESPIRATORY_TRACT | Status: AC
  Administered 2015-11-11: 0.5 mg via RESPIRATORY_TRACT
  Filled 2015-11-11: qty 2.5

## 2015-11-11 MED ORDER — CEFTRIAXONE SODIUM 1 G IJ SOLR
1.0000 g | Freq: Once | INTRAMUSCULAR | Status: AC
  Administered 2015-11-11: 1 g via INTRAVENOUS
  Filled 2015-11-11: qty 10

## 2015-11-11 MED ORDER — ALBUTEROL SULFATE (2.5 MG/3ML) 0.083% IN NEBU
5.0000 mg | INHALATION_SOLUTION | Freq: Once | RESPIRATORY_TRACT | Status: AC
  Administered 2015-11-11: 5 mg via RESPIRATORY_TRACT
  Filled 2015-11-11: qty 6

## 2015-11-11 MED ORDER — SODIUM CHLORIDE 0.9 % IV BOLUS (SEPSIS)
1000.0000 mL | Freq: Once | INTRAVENOUS | Status: AC
  Administered 2015-11-11: 1000 mL via INTRAVENOUS

## 2015-11-11 MED ORDER — ALBUTEROL SULFATE HFA 108 (90 BASE) MCG/ACT IN AERS: 2.0000 | INHALATION_SPRAY | Freq: Four times a day (QID) | RESPIRATORY_TRACT | Status: DC | PRN

## 2015-11-11 MED ORDER — CEPHALEXIN 500 MG PO CAPS: 500.0000 mg | ORAL_CAPSULE | Freq: Two times a day (BID) | ORAL | Status: AC

## 2015-11-11 MED ORDER — AZITHROMYCIN 500 MG PO TABS
500.0000 mg | ORAL_TABLET | Freq: Once | ORAL | Status: AC
  Administered 2015-11-11: 500 mg via ORAL
  Filled 2015-11-11: qty 1

## 2015-11-11 MED ORDER — PREDNISONE 20 MG PO TABS
40.0000 mg | ORAL_TABLET | Freq: Once | ORAL | Status: AC
  Administered 2015-11-11: 40 mg via ORAL
  Filled 2015-11-11: qty 2

## 2015-11-11 MED ORDER — AZITHROMYCIN 250 MG PO TABS: ORAL_TABLET | ORAL | Status: DC

## 2015-11-11 NOTE — ED Provider Notes (Signed)
[facility] Emergency Department Provider Note  ____________________________________________  Time seen: Approximately 3:13 PM  I have reviewed the triage vital signs and the nursing notes.   HISTORY  Chief Complaint Dizziness and Weakness   HPI Anna Prince is a 82 y.o. female h/o COPD, current smoker, HTN, HLD, osteoporosis, AAA who presents for evaluation of dry cough and generalized fatigue. Patient endorses 3 days of a dry cough and generalized fatigue. She reports shortness of breath however that is chronic for her COPD and unchanged. She does not require oxygen at home. She also endorses intermittent wheezing for the last 3 days with the cough. She presented today because she was concerned she had pneumonia as she had a last year. She reports for the last 3 days when she gets up from her bed in the am, she feels very lightheaded like she is going to faint. She then has a coffee and a cigarette and the sensation resolves. She endorses generalized weakness x 3 days. She also complain of mild occipital HA that started while she was waiting here. She reports that she has not had anything to eat today and that she usually has similar HAs when she does not eat. She reports that the headache is a 3 and throbbing, nonradiating. Patient denies chest pain, fever, abdominal pain, nausea, vomiting, constipation, diarrhea, dysuria. She reports normal appetite at home. She denies any new medications. She reports that she uses Symbicort at home but denies having a rescue inhaler. She denies personal or family history of ischemic heart disease. She had a stress test in 2015 that was normal. She denies syncope.  Past Medical History  Diagnosis Date  . Barrett's esophagus 2007  . Sigmoid diverticulosis     by colonoscopy  . Aortic aneurysm ([place])   . Macular degeneration   . Anxiety   . Hyperlipidemia   . Hypertension   . Osteoporosis   . Lumbago   . Depression   .  Bursitis 2012    left hip, improved with periodic steroid injection [facility] [place])  . Anemia 2012    of acute blood loss, resolved  . GERD (gastroesophageal reflux disease)     with Barretts Esophagus  . COPD (chronic obstructive pulmonary disease) ([place])   . Arthritis     Patient Active Problem List   Diagnosis Date Noted  . Acute confusional state 10/06/2015  . Generalized muscle weakness 04/16/2015  . Hospital discharge follow-up 04/16/2015  . NSTEMI (non-ST elevated myocardial infarction) ([place]) 04/14/2015  . Anxiety 04/14/2015  . Candida esophagitis ([place]) 02/10/2015  . Underweight 11/27/2014  . Chronic left hip pain 11/27/2014  . Anemia 11/25/2014  . Elevated liver enzymes 08/25/2014  . Pubic ramus fracture ([place]) 08/25/2014  . Vascular dementia of acute onset without behavioral disturbance 02/20/2014  . Chronic pain syndrome 05/27/2013  . Tobacco abuse counseling 04/10/2013  . Skin lesion of face 02/22/2012  . GERD (gastroesophageal reflux disease)   . Screening for colon cancer 06/18/2011  . COPD (chronic obstructive pulmonary disease) ([place]) 06/18/2011  . Skin cancer of nose 12/17/2010  . Vitamin D deficiency 12/17/2010  . Hyperlipidemia LDL goal <100 12/17/2010  . Screening for malignant neoplasm of breast 12/17/2010  . Encounter for long-term (current) use of other medications 12/17/2010  . Aortic aneurysm, abdominal ([place]) 12/17/2010  . Diverticulosis, sigmoid 12/08/2010  . Hyperlipidemia 12/08/2010  . Hypertension 12/08/2010  . Lumbago 12/08/2010  . Osteoporosis 12/08/2010  . Depression 12/08/2010  Past Surgical History  Procedure Laterality Date  . Appendectomy    . Back surgery      X2..1975 arachnoid cyst cervical region([place]);1997 lumbosacral tumor,9 hr surgery  . Abdominal aortic aneurysm repair  July 2010    [facility]    Current Outpatient Rx  Name  Route  Sig  Dispense  Refill  . atorvastatin (LIPITOR) 40 MG tablet   Oral   Take 1 tablet  (40 mg total) by mouth daily.   90 tablet   3     REPLACES MEVACOR FOR NEXT REFILL DATE   . budesonide-formoterol (SYMBICORT) 80-4.5 MCG/ACT inhaler   Inhalation   Inhale 2 puffs into the lungs 2 (two) times daily.   1 Inhaler   12   . diazepam (VALIUM) 5 MG tablet      TAKE 1 TABLET BY MOUTH TWICE DAILY AS NEEDED.   60 tablet   3   . HYDROcodone-acetaminophen (NORCO/VICODIN) 5-325 MG tablet   Oral   Take 1 tablet by mouth every 6 (six) hours as needed for moderate pain. Maximum 2 daily   60 tablet   0   . lisinopril (PRINIVIL,ZESTRIL) 20 MG tablet      TAKE 1 TABLET BY MOUTH EVERY DAY   90 tablet   2   . meloxicam (MOBIC) 15 MG tablet   Oral   Take 1 tablet (15 mg total) by mouth daily.   30 tablet   5   . mirtazapine (REMERON) 30 MG tablet   Oral   Take 1 tablet (30 mg total) by mouth at bedtime.   30 tablet   5   . omeprazole (PRILOSEC) 40 MG capsule      TAKE 1 CAPSULE BY MOUTH DAILY.   90 capsule   2   . PARoxetine (PAXIL) 20 MG tablet   Oral   Take 1 tablet (20 mg total) by mouth daily.   30 tablet   5   . albuterol (PROVENTIL HFA;VENTOLIN HFA) 108 (90 Base) MCG/ACT inhaler   Inhalation   Inhale 2 puffs into the lungs every 6 (six) hours as needed for wheezing or shortness of breath.   1 Inhaler   2   . azithromycin (ZITHROMAX) 250 MG tablet      Take one pill once a day for 4 days   4 each   0   . cephALEXin (KEFLEX) 500 MG capsule   Oral   Take 1 capsule (500 mg total) by mouth 2 (two) times daily.   14 capsule   0   . guaiFENesin (MUCINEX) 600 MG 12 hr tablet   Oral   Take 1 tablet (600 mg total) by mouth 2 (two) times daily.   20 tablet   0   . nystatin (MYCOSTATIN) 100000 UNIT/ML suspension   Oral   Take 5 mLs (500,000 Units total) by mouth 4 (four) times daily. Patient not taking: Reported on 06/10/2015   100 mL   0   . predniSONE (DELTASONE) 20 MG tablet   Oral   Take 2 tablets (40 mg total) by mouth daily.   8  tablet   0     Allergies Morphine sulfate  Family History  Problem Relation Age of Onset  . Coronary artery disease Mother   . Coronary artery disease Father 70  . Cancer Neg Hx     no breast,coon or ovarian ca    Social History Social History  Substance Use Topics  . Smoking status: Current  Every Day Smoker -- 0.50 packs/day    Types: Cigarettes  . Smokeless tobacco: Never Used     Comment: HAS BEEN SMOKING 30+ YEARS,RESUMED TOBACCO USE AFTER AAA REPAIR  . Alcohol Use: No    Review of Systems  Constitutional: Negative for fever. + Lightheadedness and generalized weakness Eyes: Negative for visual changes. ENT: Negative for sore throat. Cardiovascular: Negative for chest pain. Respiratory: + shortness of breath and cough Gastrointestinal: Negative for abdominal pain, vomiting or diarrhea. Genitourinary: Negative for dysuria. Musculoskeletal: Negative for back pain. Skin: Negative for rash. Neurological: Negative for weakness or numbness. + HA  ____________________________________________   PHYSICAL EXAM:  VITAL SIGNS: ED Triage Vitals  Enc Vitals Group     BP 11/11/15 1041 124/65 mmHg     Pulse Rate 11/11/15 1041 62     Resp 11/11/15 1041 20     Temp 11/11/15 1041 98.3 F (36.8 C)     Temp Source 11/11/15 1041 Oral     SpO2 11/11/15 1041 100 %     Weight 11/11/15 1041 134 lb (60.782 kg)     Height 11/11/15 1041 5\' 5"  (1.651 m)     Head Cir --      Peak Flow --      Pain Score --      Pain Loc --      Pain Edu? --      Excl. in [place]? --     Constitutional: Alert and oriented. Well appearing and in no apparent distress. HEENT:      Head: Normocephalic and atraumatic.         Eyes: Conjunctivae are normal. Sclera is non-icteric. EOMI. PERRL      Mouth/Throat: Mucous membranes are moist.       Neck: Supple with no signs of meningismus. Cardiovascular: Regular rate and rhythm. No murmurs, gallops, or rubs. 2+ symmetrical distal pulses are present in all  extremities. No JVD. Respiratory: Normal respiratory effort. Mildly decreased air movement bilaterally. Lungs are clear to auscultation bilaterally. No wheezes, crackles, or rhonchi.  Gastrointestinal: Soft, non tender, and non distended with positive bowel sounds. No rebound or guarding. Genitourinary: No CVA tenderness. Musculoskeletal: Nontender with normal range of motion in all extremities. No edema, cyanosis, or erythema of extremities. Neurologic: A & O x3, PERRL, no nystagmus, CN II-XII intact, motor testing reveals good tone and bulk throughout. There is no evidence of pronator drift or dysmetria. Muscle strength is 5/5 throughout. Deep tendon reflexes are 2+ throughout with downgoing toes. Sensory examination is intact. Gait deferred. Normal speech and language.  Skin: Skin is warm, dry and intact. No rash noted. Psychiatric: Mood and affect are normal. Speech and behavior are normal.  ____________________________________________   LABS (all labs ordered are listed, but only abnormal results are displayed)  Labs Reviewed  BASIC METABOLIC PANEL - Abnormal; Notable for the following:    Glucose, Bld 107 (*)    BUN 22 (*)    Creatinine, Ser 1.05 (*)    GFR calc non Af Amer 48 (*)    GFR calc Af Amer 56 (*)    Anion gap 4 (*)    All other components within normal limits  CBC - Abnormal; Notable for the following:    RDW 14.8 (*)    All other components within normal limits  URINALYSIS COMPLETEWITH MICROSCOPIC ([facility] ONLY) - Abnormal; Notable for the following:    Color, Urine YELLOW (*)    APPearance CLOUDY (*)    Hgb urine  dipstick 1+ (*)    Protein, ur 30 (*)    Leukocytes, UA 3+ (*)    Bacteria, UA RARE (*)    Squamous Epithelial / LPF 6-30 (*)    All other components within normal limits  URINE CULTURE  TROPONIN I  TROPONIN I   ____________________________________________  EKG  ED ECG REPORT I, Rudene Re, the attending physician, personally viewed and  interpreted this ECG.  Normal sinus rhythm, rate of 60, normal intervals, normal axis, no ST elevations or depressions, T-wave flattening on aVL.  ____________________________________________  RADIOLOGY  CXR: negative Head CT: negative ____________________________________________   PROCEDURES  Procedure(s) performed: None Critical Care performed:  None ____________________________________________   INITIAL IMPRESSION / ASSESSMENT AND PLAN / ED COURSE  82 y.o. female h/o COPD, current smoker, HTN, HLD, osteoporosis, AAA who presents for evaluation of dry cough and generalized fatigue x 3 days. Patient is well-appearing, in no distress, her vital signs are within normal limits, she has mildly diminished air movement bilaterally with no wheezing or crackles. Her neuro exam is intact. Presentation concerning for possible community-acquired pneumonia versus UTI. Chest x-ray with no infiltrate. We'll start patient on DuoNeb treatment, prednisone burst, and azithromycin for COPD exacerbation. UA is pending. EKG with no evidence of ischemia. Troponin is negative.  _________________________ 6:25 PM on 11/11/2015 ----------------------------------------- Patient with a UTI will be discharged home on Keflex after receiving a dose of ceftriaxone here. Patient will also be discharged on a Z-Pak and prednisone for mild COPD exacerbation. Patient reports marked improvement of her symptoms after 1 DuoNeb treatment and IV fluids. Patient will be given a rescue inhaler of albuterol.  Pertinent labs & imaging results that were available during my care of the patient were reviewed by me and considered in my medical decision making (see chart for details).   I discussed my evaluation of the patient's symptoms, my clinical impression, and my proposed outpatient treatment plan with patient/ family members. We have discussed anticipatory guidance, scheduled follow-up, and careful return precautions. The  patient expresses understanding and is comfortable with the discharge plan. All patient's questions were answered.    ____________________________________________   FINAL CLINICAL IMPRESSION(S) / ED DIAGNOSES  Final diagnoses:  UTI (lower urinary tract infection)  COPD exacerbation (HCC)      NEW MEDICATIONS STARTED DURING THIS VISIT:  New Prescriptions   ALBUTEROL (PROVENTIL HFA;VENTOLIN HFA) 108 (90 BASE) MCG/ACT INHALER    Inhale 2 puffs into the lungs every 6 (six) hours as needed for wheezing or shortness of breath.   AZITHROMYCIN (ZITHROMAX) 250 MG TABLET    Take one pill once a day for 4 days   CEPHALEXIN (KEFLEX) 500 MG CAPSULE    Take 1 capsule (500 mg total) by mouth 2 (two) times daily.   PREDNISONE (DELTASONE) 20 MG TABLET    Take 2 tablets (40 mg total) by mouth daily.     Note:  This document was prepared using Dragon voice recognition software and may include unintentional dictation errors.    Rudene Re, MD 11/11/15 [id number]

## 2015-11-11 NOTE — ED Notes (Signed)
Patient able to ambulate to the restroom without difficulty.

## 2015-11-11 NOTE — ED Notes (Signed)
Pt c/o dizziness with generalized weakness for the past 2 days.. Denies any pain at present or increased SOB.Marland Kitchen

## 2015-11-11 NOTE — Discharge Instructions (Signed)
You have been seen in the Emergency Department (ED)  today for a urinary tract infection.  Most UTIs are caused by bacteria and need to be treated with antibiotics. It is important to complete your treatment so that the infection does not get worse. Take your antibiotics fully even if your symptoms start to get better after the first few doses. Drink PLENTY of fluids to help clear the infection.  Follow-up with your doctor or return to the ER immediately if your symptoms are getting worse, if you develop a fever, if you develop abdominal or flank pain, or if you start to vomit. Otherwise follow up with your doctor in 1 week if your symptoms are improving.   When should you call for help?  Call your doctor now or seek immediate medical care if:  Symptoms such as a fever, chills, nausea, or vomiting get worse or happen for the first time.  You have new pain in your back just below your rib cage. This is called flank pain.  There is new blood or pus in your urine.  You are not able to take or keep down your antibiotics. Your symptoms are not getting better after 48 hours of antibiotic treatment  Watch closely for changes in your health, and be sure to contact your doctor if:  You are not getting better after taking an antibiotic for 2 days.  Your symptoms go away but then come back.   How can you care for yourself at home?  Take your antibiotics as prescribed. Do not stop taking them just because you feel better. You need to take the full course of antibiotics.  Take your medicines exactly as prescribed. Your doctor may have prescribed a medicine, such as phenazopyridine (Pyridium), to help relieve pain when you urinate. This turns your urine orange. You may stop taking it when your symptoms get better. But be sure to take all of your antibiotics, which treat the infection.  Drink extra water and juices such as cranberry and blueberry juices for the next day or two. This will help make the urine  less concentrated and help wash out the bacteria causing the infection. (If you have kidney, heart, or liver disease and have to limit your fluids, talk with your doctor before you increase your fluid intake.)  Avoid drinks that are carbonated or have caffeine. They can irritate the bladder.  Urinate often. Try to empty your bladder each time.  To relieve pain, take a hot bath or lay a heating pad (set on low) over your lower belly or genital area. Never go to sleep with a heating pad in place.  To help prevent UTIs  Drink plenty of fluids, enough so that your urine is light yellow or clear like water. If you have kidney, heart, or liver disease and have to limit fluids, talk with your doctor before you increase the amount of fluids you drink.  Urinate when you have the urge. Do not hold your urine for a long time. Urinate before you go to sleep.  Keep your vagina/ penis clean.       You were seen today in the Emergency Department (ED) and was diagnosed with a COPD exacerbation. Chronic obstructive pulmonary disease (COPD) is a general term for a group of lung diseases, including emphysema and chronic bronchitis. People with COPD have decreased airflow in and out of the lungs, which makes it hard to breathe. The airways also can get clogged with thick mucus. Cigarette smoking is  a major cause of COPD.   Although there is no cure for COPD, you can slow its progress. Following your treatment plan and taking care of yourself can help you feel better and live longer.   Use your albuterol inhaler 2 puffs every 4 hour as needed for shortness of breath, wheezing, or cough. Take steroids as prescribed. Take antibiotics as prescribed.  Follow-up with your doctor in 1 day for re-evaluation.   When should you call for help?  Call 911 anytime you think you may need emergency care. For example, call if:  You have severe trouble breathing.  You have severe chest pain. Call your doctor now or seek  immediate medical care if:  You have new or worse shortness of breath.  You develop new chest pain.  You are coughing more deeply or more often, especially if you notice more mucus or a change in the color of your mucus.  You cough up blood.  You have new or increased swelling in your legs or belly.  You have a fever. Watch closely for changes in your health, and be sure to contact your doctor if:  You use your antibiotic prescription.  Your symptoms are getting worse   How can you care for yourself at home?   During an exacerbation  Do not panic if you start to have one. Quick treatment at home may help you prevent serious breathing problems. If you have a COPD exacerbation plan that you developed with your doctor, follow it.  Take your medicines exactly as your doctor tells you.  Use your inhaler as directed by your doctor. If your symptoms do not get better after you use your medicine, have someone take you to the emergency room. Call an ambulance if necessary.  With inhaled medicines, a spacer or a nebulizer may help you get more medicine to your lungs. Ask your doctor or pharmacist how to use them properly. Practice using the spacer in front of a mirror before you have an exacerbation. This may help you get the medicine into your lungs quickly.  If your doctor has given you steroid pills, take them as directed.  Your doctor may have given you a prescription for antibiotics, which you are to fill if you need it. Call your doctor if you use the prescription.  Talk to your doctor if you have any problems with your medicine.  Preventing an exacerbation  Do not smoke. This is the most important step you can take to prevent more damage to your lungs and prevent problems. If you already smoke, it is never too late to stop. If you need help quitting, talk to your doctor about stop-smoking programs and medicines. These can increase your chances of quitting for good.  Take your daily medicines as  prescribed.  Avoid colds and flu.  Get a pneumococcal vaccine.  Get a flu vaccine each year, as soon as it is available. Ask those you live or work with to do the same, so they will not get the flu and infect you.  Try to stay away from people with colds or the flu.  Wash your hands often. Avoid secondhand smoke; air pollution; cold, dry air; hot, humid air; and high altitudes. Stay at home with your windows closed when air pollution is bad.  Learn breathing techniques for COPD, such as breathing through pursed lips. These techniques can help you breathe easier during an exacerbation.  Staying healthy  Do not smoke. This is the most important  step you can take to prevent more damage to your lungs. If you need help quitting, talk to your doctor about stop-smoking programs and medicines. These can increase your chances of quitting for good.  Avoid colds and flu. Get a pneumococcal vaccine shot. If you have had one before, ask your doctor whether you need a second dose. Get the flu vaccine every fall. If you must be around people with colds or the flu, wash your hands often.  Avoid secondhand smoke, air pollution, and high altitudes. Also avoid cold, dry air and hot, humid air. Stay at home with your windows closed when air pollution is bad.  Medicines and oxygen therapy  Take your medicines exactly as prescribed. Call your doctor if you think you are having a problem with your medicine.  You may be taking medicines such as:  Bronchodilators. These help open your airways and make breathing easier. Bronchodilators are either short-acting (work for 6 to 9 hours) or long-acting (work for 24 hours). You inhale most bronchodilators, so they start to act quickly. Always carry your quick-relief inhaler with you in case you need it while you are away from home.  Corticosteroids (prednisone, budesonide). These reduce airway inflammation. They come in pill or inhaled form. You must take these medicines every  day for them to work well. A spacer may help you get more inhaled medicine to your lungs. Ask your doctor or pharmacist if a spacer is right for you. If it is, ask how to use it properly.  Do not take any vitamins, over-the-counter medicine, or herbal products without talking to your doctor first.  If your doctor prescribed antibiotics, take them as directed. Do not stop taking them just because you feel better. You need to take the full course of antibiotics.  Oxygen therapy boosts the amount of oxygen in your blood and helps you breathe easier. Use the flow rate your doctor has recommended, and do not change it without talking to your doctor first.  Activity  Get regular exercise. Walking is an easy way to get exercise. Start out slowly, and walk a little more each day.  Pay attention to your breathing. You are exercising too hard if you cannot talk while you are exercising.  Take short rest breaks when doing household chores and other activities.  Learn breathing methods--such as breathing through pursed lips--to help you become less short of breath.  If your doctor has not set you up with a pulmonary rehabilitation program, talk to him or her about whether rehab is right for you. Rehab includes exercise programs, education about your disease and how to manage it, help with diet and other changes, and emotional support.  Diet  Eat regular, healthy meals. Use bronchodilators about 1 hour before you eat to make it easier to eat. Eat several small meals instead of three large ones. Drink beverages at the end of the meal. Avoid foods that are hard to chew.  Eat foods that contain fat and protein so that you do not lose weight and muscle mass. These foods include ice cream, pudding, cheese, eggs, and peanut butter.  Use less salt. Too much salt can cause you to retain fluids, which makes it harder to breathe. Do not add salt while you are cooking or at the table. Eat fewer processed foods and foods  from restaurants, including fast foods. Use fresh or frozen foods instead of canned foods.  Mental health  Talk to your family, friends, or a therapist about your  feelings. It is normal to feel frightened, angry, hopeless, helpless, and even guilty. Talking openly about bad feelings can help you cope. If these feelings last, talk to your doctor.

## 2015-11-11 NOTE — ED Notes (Signed)
Pt. Reports dizziness, congestion, and a "terrible headache" for about the past 3 days. Pt. Daughter reporting pt. hasn't been eating or drinking. Pt. Reports a tingling pain in her hands.  Pt. Denies pain at this time, denies fever, denies any changes in routine or environment lately. Pt. States blurry vision and "dizziess is just there", not changed by movement. Pupils equal and brisk reactive. Pt. Reports having a chest tightness sometimes, denies cardiac hx. Pt. Reports having COPD and is a current smoker, no home O2.

## 2015-11-13 LAB — URINE CULTURE

## 2015-11-28 ENCOUNTER — Telehealth: Payer: Self-pay | Admitting: *Deleted

## 2015-11-28 NOTE — Telephone Encounter (Signed)
Patient requested a medication refill for hydrocodone.   

## 2015-11-28 NOTE — Telephone Encounter (Signed)
Last OV and refill on 10/05/15 #60 with 0 refills... Okay to refill?

## 2015-11-29 ENCOUNTER — Encounter: Payer: Self-pay | Admitting: *Deleted

## 2015-11-29 MED ORDER — HYDROCODONE-ACETAMINOPHEN 5-325 MG PO TABS: 1.0000 | ORAL_TABLET | Freq: Four times a day (QID) | ORAL | 0 refills | Status: DC | PRN

## 2015-11-29 NOTE — Telephone Encounter (Signed)
Letter printed and with Rx

## 2015-11-29 NOTE — Telephone Encounter (Signed)
Will refill.  Can't refill  Until August 14 .   Please print out the following in letter form to give to patient with rx:  Dear  Ms Anna Prince;  Given the recent passage of federal legislation to reduce the prescribing of narcotics by physicians, I feel it is necessary to advise or remind my patients of my policy going forward :  1) Prescriptions for narcotics cannot be replaced if lost. 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  2) Recent passage of the STOP law restricts physicians from writing narcotics prescriptions  for management of acute pain to a period of 5 days or less.  This will require an office visit.  3) Refills of narcotics for chronic pain will be reevaluated on a case by case basis by me, and if continued,  Will require office visits every 3 months so that the [place] Controlled Substance database can be reviewed during the visit. This is required to ensure that I do not lose my license.  4) Prescriptions for narcotics  must be picked up by the patient or by an authorized friend/family member; they cannot be faxed to pharmacies.  5) When I am out of the office, narcotics prescriptions will not be refilled by another physician . 6) This letter in no way nullifies the [place] narcotics agreement you have signed in the past.    Regards,   Deborra Medina, MD

## 2015-12-02 DIAGNOSIS — M7061 Trochanteric bursitis, right hip: Secondary | ICD-10-CM | POA: Diagnosis not present

## 2015-12-02 DIAGNOSIS — M25551 Pain in right hip: Secondary | ICD-10-CM | POA: Diagnosis not present

## 2015-12-02 DIAGNOSIS — M545 Low back pain: Secondary | ICD-10-CM | POA: Diagnosis not present

## 2015-12-12 ENCOUNTER — Other Ambulatory Visit: Payer: Self-pay | Admitting: Internal Medicine

## 2015-12-13 ENCOUNTER — Other Ambulatory Visit: Payer: Self-pay | Admitting: Internal Medicine

## 2015-12-29 ENCOUNTER — Ambulatory Visit: Payer: Medicare Other | Attending: Internal Medicine

## 2016-01-02 ENCOUNTER — Telehealth: Payer: Self-pay | Admitting: *Deleted

## 2016-01-02 NOTE — Telephone Encounter (Signed)
Patient has requested a medication refill for diazepam and hydrocodone  Please call pt when ready for pick up  Pt contact [phone]

## 2016-01-02 NOTE — Telephone Encounter (Signed)
Hydrocodone was refilled on 11/29/15 #60 no refills, Diazepam was refilled last on 10/27/15 with #60 with 3 refills.  Please advise for refill, thanks

## 2016-01-03 MED ORDER — DIAZEPAM 5 MG PO TABS: 5.0000 mg | ORAL_TABLET | Freq: Two times a day (BID) | ORAL | 0 refills | Status: DC | PRN

## 2016-01-03 NOTE — Telephone Encounter (Signed)
HYDROCODONE DENIED,  PATIENT MUST BE SEEN EVERY 3 MONTHS, LAST OV WAS IN June ([place])

## 2016-01-05 NOTE — Telephone Encounter (Signed)
Yes you can use Monday

## 2016-01-05 NOTE — Telephone Encounter (Signed)
Monday evening any week, thanks

## 2016-01-05 NOTE — Telephone Encounter (Signed)
Dr. Derrel Nip, can we use a Monday night for this? Or advise another time, thanks

## 2016-01-05 NOTE — Telephone Encounter (Signed)
Please give a time and date to schedule pt for a follow up. Pt stated that her medication was almost out.

## 2016-01-05 NOTE — Telephone Encounter (Signed)
Scheduled

## 2016-01-09 ENCOUNTER — Encounter (INDEPENDENT_AMBULATORY_CARE_PROVIDER_SITE_OTHER): Payer: Self-pay

## 2016-01-09 ENCOUNTER — Encounter: Payer: Self-pay | Admitting: Internal Medicine

## 2016-01-09 ENCOUNTER — Ambulatory Visit (INDEPENDENT_AMBULATORY_CARE_PROVIDER_SITE_OTHER): Payer: Medicare Other | Admitting: Internal Medicine

## 2016-01-09 VITALS — BP 132/88 | HR 81 | Temp 98.1°F | Resp 15 | Wt 135.2 lb

## 2016-01-09 DIAGNOSIS — E785 Hyperlipidemia, unspecified: Secondary | ICD-10-CM

## 2016-01-09 DIAGNOSIS — Z23 Encounter for immunization: Secondary | ICD-10-CM

## 2016-01-09 DIAGNOSIS — M810 Age-related osteoporosis without current pathological fracture: Secondary | ICD-10-CM

## 2016-01-09 DIAGNOSIS — I10 Essential (primary) hypertension: Secondary | ICD-10-CM | POA: Diagnosis not present

## 2016-01-09 DIAGNOSIS — G894 Chronic pain syndrome: Secondary | ICD-10-CM

## 2016-01-09 DIAGNOSIS — F419 Anxiety disorder, unspecified: Secondary | ICD-10-CM

## 2016-01-09 DIAGNOSIS — F331 Major depressive disorder, recurrent, moderate: Secondary | ICD-10-CM

## 2016-01-09 DIAGNOSIS — F329 Major depressive disorder, single episode, unspecified: Secondary | ICD-10-CM

## 2016-01-09 DIAGNOSIS — E559 Vitamin D deficiency, unspecified: Secondary | ICD-10-CM | POA: Diagnosis not present

## 2016-01-09 DIAGNOSIS — F0151 Vascular dementia with behavioral disturbance: Secondary | ICD-10-CM

## 2016-01-09 DIAGNOSIS — F0153 Vascular dementia, unspecified severity, with mood disturbance: Secondary | ICD-10-CM

## 2016-01-09 MED ORDER — HYDROCODONE-ACETAMINOPHEN 5-325 MG PO TABS: 1.0000 | ORAL_TABLET | Freq: Four times a day (QID) | ORAL | 0 refills | Status: DC | PRN

## 2016-01-09 NOTE — Progress Notes (Signed)
Subjective:  Patient ID: Anna Prince, female    DOB: 17-Feb-1932  Age: 82 y.o. MRN: [id number]  CC: The primary encounter diagnosis was Vitamin D deficiency. Diagnoses of Chronic pain syndrome, Essential hypertension, Hyperlipidemia, Major depressive disorder, recurrent episode, moderate ([place]), Anxiety, Vascular dementia with depressed mood, and Osteoporosis were also pertinent to this visit.  HPI Anna Prince presents for follow up on chronic pain managed with oral medications including NSAIDs and narcotics.   Patient has a history of chronic low back pain secondary to history of  Osteoporosis  with scoliosis, kyphosis and vertebral fracture.  She has been prescribed  hydrocodone twice daily, along with daily meloxicam .  She ran out several days ago and has been in significant pain .  She also suffered from chronic right hip pain and has been seeing Carlynn Spry for steroid injections in the right hip, with her most recent injection done in  early August.   An injection of the left hip is scheduled soon as well. She is accompanied by her daugher today.   She continues to smoke cigarettes daily,  A pack lasts up to 3 days.    No real effort or desire to quit given her age and life expectancy.  She has a chronic productive cough,  Denies fevers,  Chills,  Change in sputum and change in baseline dyspnea  No recent falls.   Continues to use diazepam 5 mg twice daily for anxiety, insomnia,  and muscle spasm Still taking paxil and remeron.  Weight stable.  No crying spells.   Outpatient Medications Prior to Visit  Medication Sig Dispense Refill  . albuterol (PROVENTIL HFA;VENTOLIN HFA) 108 (90 Base) MCG/ACT inhaler Inhale 2 puffs into the lungs every 6 (six) hours as needed for wheezing or shortness of breath. 1 Inhaler 2  . atorvastatin (LIPITOR) 40 MG tablet Take 1 tablet (40 mg total) by mouth daily. 90 tablet 3  . budesonide-formoterol (SYMBICORT) 80-4.5 MCG/ACT inhaler Inhale 2  puffs into the lungs 2 (two) times daily. 1 Inhaler 12  . diazepam (VALIUM) 5 MG tablet Take 1 tablet (5 mg total) by mouth 2 (two) times daily as needed. 60 tablet 0  . lisinopril (PRINIVIL,ZESTRIL) 20 MG tablet TAKE 1 TABLET BY MOUTH EVERY DAY 90 tablet 2  . meloxicam (MOBIC) 15 MG tablet Take 1 tablet (15 mg total) by mouth daily. 30 tablet 5  . mirtazapine (REMERON) 30 MG tablet TAKE 1 TABLET(30 MG) BY MOUTH AT BEDTIME 30 tablet 2  . omeprazole (PRILOSEC) 40 MG capsule TAKE 1 CAPSULE BY MOUTH DAILY. 90 capsule 2  . PARoxetine (PAXIL) 20 MG tablet TAKE 1 TABLET(20 MG) BY MOUTH DAILY 30 tablet 5  . azithromycin (ZITHROMAX) 250 MG tablet Take one pill once a day for 4 days 4 each 0  . guaiFENesin (MUCINEX) 600 MG 12 hr tablet Take 1 tablet (600 mg total) by mouth 2 (two) times daily. 20 tablet 0  . HYDROcodone-acetaminophen (NORCO/VICODIN) 5-325 MG tablet Take 1 tablet by mouth every 6 (six) hours as needed for moderate pain. Maximum 2 daily. May not refill until December 05 2015 60 tablet 0  . predniSONE (DELTASONE) 20 MG tablet Take 2 tablets (40 mg total) by mouth daily. 8 tablet 0  . nystatin (MYCOSTATIN) 100000 UNIT/ML suspension Take 5 mLs (500,000 Units total) by mouth 4 (four) times daily. (Patient not taking: Reported on 01/09/2016) 100 mL 0   No facility-administered medications prior to visit.  Review of Systems;  Patient denies headache, fevers, malaise, unintentional weight loss, skin rash, eye pain, sinus congestion and sinus pain, sore throat, dysphagia,  hemoptysis , cough, dyspnea, wheezing, chest pain, palpitations, orthopnea, edema, abdominal pain, nausea, melena, diarrhea, constipation, flank pain, dysuria, hematuria, urinary  Frequency, nocturia, numbness, tingling, seizures,  Focal weakness, Loss of consciousness,  Tremor, insomnia, depression, anxiety, and suicidal ideation.      Objective:  BP 132/88   Pulse 81   Temp 98.1 F (36.7 C) (Oral)   Resp 15   Wt 135 lb  3.2 oz (61.3 kg)   SpO2 97%   BMI 22.50 kg/m   BP Readings from Last 3 Encounters:  01/09/16 132/88  11/11/15 (!) 161/94  10/05/15 130/62    Wt Readings from Last 3 Encounters:  01/09/16 135 lb 3.2 oz (61.3 kg)  11/11/15 134 lb (60.8 kg)  10/05/15 134 lb 8 oz (61 kg)    General appearance: alert, cooperative and appears stated age Ears: normal TM's and external ear canals both ears Throat: lips, mucosa, and tongue normal; teeth and gums normal Neck: no adenopathy, no carotid bruit, supple, symmetrical, trachea midline and thyroid not enlarged, symmetric, no tenderness/mass/nodules Back: symmetric, no curvature. ROM normal. No CVA tenderness. Lungs: clear to auscultation bilaterally Heart: regular rate and rhythm, S1, S2 normal, no murmur, click, rub or gallop Abdomen: soft, non-tender; bowel sounds normal; no masses,  no organomegaly Pulses: 2+ and symmetric Skin: Skin color, texture, turgor normal. No rashes or lesions Lymph nodes: Cervical, supraclavicular, and axillary nodes normal.  No results found for: HGBA1C  Lab Results  Component Value Date   CREATININE 1.05 (H) 11/11/2015   CREATININE 1.19 11/04/2015   CREATININE 1.28 (H) 05/03/2015    Lab Results  Component Value Date   WBC 4.3 11/11/2015   HGB 12.4 11/11/2015   HCT 36.2 11/11/2015   PLT 188 11/11/2015   GLUCOSE 107 (H) 11/11/2015   CHOL 209 (H) 11/04/2015   TRIG 120.0 11/04/2015   HDL 72.90 11/04/2015   LDLDIRECT 94.0 11/25/2014   LDLCALC 112 (H) 11/04/2015   ALT 12 11/04/2015   AST 16 11/04/2015   NA 137 11/11/2015   K 4.3 11/11/2015   CL 107 11/11/2015   CREATININE 1.05 (H) 11/11/2015   BUN 22 (H) 11/11/2015   CO2 26 11/11/2015   TSH 0.44 11/04/2015   INR 1.00 05/03/2015    Dg Chest 2 View  Result Date: 11/11/2015 CLINICAL DATA:  Cough, dizziness and weakness, COPD EXAM: CHEST  2 VIEW COMPARISON:  05/03/2015 FINDINGS: Hyperinflation noted without focal pneumonia, collapse or consolidation.  No edema, effusion or pneumothorax. Normal heart size and vascularity. Aorta is atherosclerotic and tortuous. Small hiatal hernia noted. Apical scarring also present. Degenerative thoracic spondylosis diffusely. Compression fracture noted at approximately T12 on the lateral view, age indeterminate. There is scoliosis of the spine as well. IMPRESSION: Chronic COPD/emphysema without superimposed acute chest process. Thoracic aortic atherosclerosis Small hiatal hernia Electronically Signed   By: Jerilynn Mages.  Shick M.D.   On: 11/11/2015 15:07   Ct Head Wo Contrast  Result Date: 11/11/2015 CLINICAL DATA:  Headache. EXAM: CT HEAD WITHOUT CONTRAST TECHNIQUE: Contiguous axial images were obtained from the base of the skull through the vertex without intravenous contrast. COMPARISON:  09/17/2013 MRI.  06/25/2011 CT. FINDINGS: Brain: Moderate low density in the periventricular white matter likely related to small vessel disease. Dilated perivascular space versus remote lacunar infarct in the right thalamus. No mass lesion, hemorrhage, hydrocephalus, acute  infarct, intra-axial, or extra-axial fluid collection. Vascular: No hyperdense vessel or unexpected calcification. Skull: Negative for fracture or focal lesion. Sinuses/Orbits: Clear paranasal sinuses and mastoid air cells. Cerumen in the left external ear canal. Normal orbits and globes. Other: None. IMPRESSION: 1.  No acute intracranial abnormality. 2. Small vessel ischemic change. 3. No explanation for headache. Electronically Signed   By: Abigail Miyamoto M.D.   On: 11/11/2015 15:01    Assessment & Plan:   Problem List Items Addressed This Visit    Osteoporosis    Referral for Prolia again made.   History of vertebral fractures.        Major depressive disorder, recurrent episode, moderate (H[facility])    Patient's symptoms are controlled with Paxil and remeron.  No changes today      Chronic pain syndrome    Secondary to DJD left hip and lumbago  With osteoporotic  fractures. Her pain is managed with melxicam , twice daily vicodin. Advised to add 1000 mg tylenol to current regimen to avoid escalation of narcotic use. Refills for 3 months of Vicodin was given after refill history confirmed via [place] Controlled Substance databas, accessed by me today..      Vascular dementia with depressed mood    longterm care has been discussed and rejected.   She is not willing to move to [place] to live with her daughter and her daughter vowed not to place her in assisted living.        Anxiety    Managed with Paxil and low dose valium. Refill history confirmed via [place] Controlled Substance databas, accessed by me today..      Hypertension   Relevant Orders   Comprehensive metabolic panel   Hyperlipidemia   Relevant Orders   Lipid panel   Vitamin D deficiency - Primary   Relevant Orders   VITAMIN D 25 Hydroxy (Vit-D Deficiency, Fractures)    Other Visit Diagnoses   None.     I have discontinued Ms. Piotrowski's guaiFENesin, azithromycin, and predniSONE. I am also having her maintain her budesonide-formoterol, nystatin, meloxicam, omeprazole, lisinopril, atorvastatin, albuterol, mirtazapine, PARoxetine, diazepam, and HYDROcodone-acetaminophen.  Meds ordered this encounter  Medications  . DISCONTD: HYDROcodone-acetaminophen (NORCO/VICODIN) 5-325 MG tablet    Sig: Take 1 tablet by mouth every 6 (six) hours as needed for moderate pain. Maximum 2 daily.    Dispense:  60 tablet    Refill:  0  . DISCONTD: HYDROcodone-acetaminophen (NORCO/VICODIN) 5-325 MG tablet    Sig: Take 1 tablet by mouth every 6 (six) hours as needed for moderate pain. Maximum 2 daily.    Dispense:  62 tablet    Refill:  0    For refill on or after February 08 2016  . HYDROcodone-acetaminophen (NORCO/VICODIN) 5-325 MG tablet    Sig: Take 1 tablet by mouth every 6 (six) hours as needed for moderate pain. Maximum 2 daily.    Dispense:  62 tablet    Refill:  0    For refill on or after  March 10 2016    Medications Discontinued During This Encounter  Medication Reason  . azithromycin (ZITHROMAX) 250 MG tablet Error  . guaiFENesin (MUCINEX) 600 MG 12 hr tablet Error  . predniSONE (DELTASONE) 20 MG tablet Error  . HYDROcodone-acetaminophen (NORCO/VICODIN) 5-325 MG tablet Reorder  . HYDROcodone-acetaminophen (NORCO/VICODIN) 5-325 MG tablet Reorder  . HYDROcodone-acetaminophen (NORCO/VICODIN) 5-325 MG tablet Reorder    Follow-up: Return in about 3 months (around 04/09/2016).   Crecencio Mc, MD

## 2016-01-09 NOTE — Assessment & Plan Note (Addendum)
Secondary to DJD left hip and lumbago  With osteoporotic fractures. Her pain is managed with melxicam , twice daily vicodin. Advised to add 1000 mg tylenol to current regimen to avoid escalation of narcotic use. Refills for 3 months of Vicodin was given after refill history confirmed via [place] Controlled Substance databas, accessed by me today.Marland Kitchen

## 2016-01-09 NOTE — Patient Instructions (Signed)
I have refilled your Hydrocodone for 3 months  Continue daily use of meloxicam  You can add  500 mg of tylenol twice daily in between your doses of Vicodin   Return in 3 months , fasting  Labs prior to visit  We are still trying to get you Prolia injection to manage your osteoporosis (to prevent more fractures)  PLEASE keep trying to quit smoking !

## 2016-01-09 NOTE — Progress Notes (Signed)
Pre visit review using our clinic review tool, if applicable. No additional management support is needed unless otherwise documented below in the visit note. 

## 2016-01-10 ENCOUNTER — Encounter: Payer: Self-pay | Admitting: Internal Medicine

## 2016-01-10 NOTE — Assessment & Plan Note (Signed)
Managed with Paxil and low dose valium. Refill history confirmed via [place] Controlled Substance databas, accessed by me today.Anna Prince

## 2016-01-10 NOTE — Assessment & Plan Note (Signed)
Referral for Prolia again made.   History of vertebral fractures.

## 2016-01-10 NOTE — Assessment & Plan Note (Addendum)
longterm care has been discussed and rejected.   She is not willing to move to [place] to live with her daughter and her daughter vowed not to place her in assisted living.

## 2016-01-10 NOTE — Assessment & Plan Note (Signed)
Patient's symptoms are controlled with Paxil and remeron.  No changes today

## 2016-01-16 DIAGNOSIS — M7062 Trochanteric bursitis, left hip: Secondary | ICD-10-CM | POA: Diagnosis not present

## 2016-02-20 ENCOUNTER — Telehealth: Payer: Self-pay | Admitting: *Deleted

## 2016-02-20 NOTE — Telephone Encounter (Signed)
[facility]  requested to know if the the provider query form was received to be filled out and returned  Contact [phone]  Ref BT:[id number]

## 2016-02-21 NOTE — Telephone Encounter (Signed)
In red folder for Dr. Derrel Nip

## 2016-02-24 NOTE — Telephone Encounter (Signed)
Paper faxed as requested.

## 2016-02-25 ENCOUNTER — Other Ambulatory Visit: Payer: Self-pay | Admitting: Internal Medicine

## 2016-03-23 ENCOUNTER — Other Ambulatory Visit: Payer: Self-pay | Admitting: Internal Medicine

## 2016-03-28 ENCOUNTER — Other Ambulatory Visit (INDEPENDENT_AMBULATORY_CARE_PROVIDER_SITE_OTHER): Payer: Medicare Other

## 2016-03-28 DIAGNOSIS — I10 Essential (primary) hypertension: Secondary | ICD-10-CM

## 2016-03-28 DIAGNOSIS — E785 Hyperlipidemia, unspecified: Secondary | ICD-10-CM | POA: Diagnosis not present

## 2016-03-28 DIAGNOSIS — E559 Vitamin D deficiency, unspecified: Secondary | ICD-10-CM

## 2016-03-28 LAB — COMPREHENSIVE METABOLIC PANEL
ALK PHOS: 65 U/L (ref 39–117)
ALT: 10 U/L (ref 0–35)
AST: 15 U/L (ref 0–37)
Albumin: 3.8 g/dL (ref 3.5–5.2)
BUN: 25 mg/dL — AB (ref 6–23)
CO2: 26 mEq/L (ref 19–32)
CREATININE: 1.31 mg/dL — AB (ref 0.40–1.20)
Calcium: 9.5 mg/dL (ref 8.4–10.5)
Chloride: 106 mEq/L (ref 96–112)
GFR: 41.3 mL/min — ABNORMAL LOW (ref >60.00)
GLUCOSE: 105 mg/dL — AB (ref 70–99)
POTASSIUM: 4.6 meq/L (ref 3.5–5.1)
SODIUM: 138 meq/L (ref 135–145)
TOTAL PROTEIN: 6.2 g/dL (ref 6.0–8.3)
Total Bilirubin: 0.5 mg/dL (ref 0.2–1.2)

## 2016-03-28 LAB — LIPID PANEL
Cholesterol: 153 mg/dL (ref 0–200)
HDL: 60.2 mg/dL (ref >39.00)
LDL Cholesterol: 72 mg/dL (ref 0–99)
NONHDL: 92.58
Total CHOL/HDL Ratio: 3
Triglycerides: 102 mg/dL (ref 0.0–149.0)
VLDL: 20.4 mg/dL (ref 0.0–40.0)

## 2016-03-28 LAB — VITAMIN D 25 HYDROXY (VIT D DEFICIENCY, FRACTURES): VITD: 28.74 ng/mL — ABNORMAL LOW (ref 30.00–100.00)

## 2016-04-02 NOTE — Progress Notes (Signed)
Patient notified of results.

## 2016-04-04 ENCOUNTER — Encounter: Payer: Self-pay | Admitting: *Deleted

## 2016-04-09 ENCOUNTER — Encounter: Payer: Self-pay | Admitting: Internal Medicine

## 2016-04-09 ENCOUNTER — Ambulatory Visit (INDEPENDENT_AMBULATORY_CARE_PROVIDER_SITE_OTHER): Payer: Medicare Other | Admitting: Internal Medicine

## 2016-04-09 DIAGNOSIS — Z79891 Long term (current) use of opiate analgesic: Secondary | ICD-10-CM | POA: Diagnosis not present

## 2016-04-09 DIAGNOSIS — I2583 Coronary atherosclerosis due to lipid rich plaque: Secondary | ICD-10-CM

## 2016-04-09 DIAGNOSIS — F0153 Vascular dementia, unspecified severity, with mood disturbance: Secondary | ICD-10-CM

## 2016-04-09 DIAGNOSIS — I714 Abdominal aortic aneurysm, without rupture, unspecified: Secondary | ICD-10-CM

## 2016-04-09 DIAGNOSIS — Z79899 Other long term (current) drug therapy: Secondary | ICD-10-CM | POA: Diagnosis not present

## 2016-04-09 DIAGNOSIS — M7062 Trochanteric bursitis, left hip: Secondary | ICD-10-CM

## 2016-04-09 DIAGNOSIS — F0151 Vascular dementia with behavioral disturbance: Secondary | ICD-10-CM

## 2016-04-09 DIAGNOSIS — I2510 Atherosclerotic heart disease of native coronary artery without angina pectoris: Secondary | ICD-10-CM

## 2016-04-09 DIAGNOSIS — G894 Chronic pain syndrome: Secondary | ICD-10-CM

## 2016-04-09 DIAGNOSIS — F329 Major depressive disorder, single episode, unspecified: Secondary | ICD-10-CM

## 2016-04-09 MED ORDER — DIAZEPAM 5 MG PO TABS: 2.5000 mg | ORAL_TABLET | Freq: Two times a day (BID) | ORAL | 5 refills | Status: DC | PRN

## 2016-04-09 MED ORDER — MIRTAZAPINE 45 MG PO TABS: 45.0000 mg | ORAL_TABLET | Freq: Every day | ORAL | 5 refills | Status: DC

## 2016-04-09 MED ORDER — HYDROCODONE-ACETAMINOPHEN 5-325 MG PO TABS: 1.0000 | ORAL_TABLET | Freq: Four times a day (QID) | ORAL | 0 refills | Status: DC | PRN

## 2016-04-09 MED ORDER — PAROXETINE HCL 30 MG PO TABS: 30.0000 mg | ORAL_TABLET | Freq: Every day | ORAL | 1 refills | Status: DC

## 2016-04-09 NOTE — Patient Instructions (Addendum)
  YOUR WEIGHT IS DOWN 9 LBS.  Make a milkshake using  The   Chocolate Premier Protein  shakes (available at [employer])  and add ice cream.   eat one DAILY   I AM REDUCING  YOUR MORNING AND EVENING VALIUM (DIAZEPAM) DOSE TO 1/2 TABLET TWICE DAILY    I AM INCREASING YOUR PAXIL DOSE AND YOUR MIRTAZIPINE DOSE  FOR YOUR ANXIETY  AND YOUR LOW APPETITE

## 2016-04-09 NOTE — Progress Notes (Signed)
Subjective:  Patient ID: Anna Prince, female    DOB: 1931-10-29  Age: 82 y.o. MRN: [id number]  CC: Diagnoses of Trochanteric bursitis of left hip, Vascular dementia with depressed mood, Coronary artery disease due to lipid rich plaque, Abdominal aortic aneurysm (AAA) without rupture ([place]), and Chronic pain syndrome were pertinent to this visit.  HPI Anna Prince presents for follow up on chronic pain secondary to spinal stenosis and DJD, advanced  COPD,  Hyperlipidemia, and  Vascular dementia with depressed mood.  She is accompanied by her daughter and primary caregiver, [name], who is experiencing acute onset of nausea and diarrhea which started early this morning. Patient is thus far unaffected.   She has lost weight.  She has no appetite except for candy and cookies.  She has lost  9 lbs since last visit.  Doing very little in the way of acitivity,  Walking very little and now with generalized weakness   Taking paxil and remeron as directed.  "just not hungry"   Chronic pain: : Refill history confirmed via [place] Controlled Substance database, accessed by me today..    STILL SMOKING .   USING ALBUTEROL  TWICE DAILY , STANDARD USE   SLEEPS WELL USING VALIUM  TIWCE DAILY.  LOTS OF FAMILY STRESSORs . DAUGHTER IS NOTICING OCCASIONAL TREMORS OF ARMS AND HANDS     Outpatient Medications Prior to Visit  Medication Sig Dispense Refill  . albuterol (PROVENTIL HFA;VENTOLIN HFA) 108 (90 Base) MCG/ACT inhaler Inhale 2 puffs into the lungs every 6 (six) hours as needed for wheezing or shortness of breath. 1 Inhaler 2  . atorvastatin (LIPITOR) 40 MG tablet Take 1 tablet (40 mg total) by mouth daily. 90 tablet 3  . budesonide-formoterol (SYMBICORT) 80-4.5 MCG/ACT inhaler Inhale 2 puffs into the lungs 2 (two) times daily. 1 Inhaler 12  . lisinopril (PRINIVIL,ZESTRIL) 20 MG tablet TAKE 1 TABLET BY MOUTH EVERY DAY 90 tablet 2  . meloxicam (MOBIC) 15 MG tablet Take 1 tablet (15 mg total) by mouth  daily. 30 tablet 5  . omeprazole (PRILOSEC) 40 MG capsule TAKE 1 CAPSULE BY MOUTH DAILY. 90 capsule 2  . diazepam (VALIUM) 5 MG tablet Take 1 tablet (5 mg total) by mouth 2 (two) times daily as needed. 60 tablet 0  . mirtazapine (REMERON) 30 MG tablet TAKE 1 TABLET(30 MG) BY MOUTH AT BEDTIME 30 tablet 5  . PARoxetine (PAXIL) 20 MG tablet TAKE 1 TABLET(20 MG) BY MOUTH DAILY 30 tablet 5  . HYDROcodone-acetaminophen (NORCO/VICODIN) 5-325 MG tablet Take 1 tablet by mouth every 6 (six) hours as needed for moderate pain. Maximum 2 daily. 62 tablet 0  . nystatin (MYCOSTATIN) 100000 UNIT/ML suspension Take 5 mLs (500,000 Units total) by mouth 4 (four) times daily. (Patient not taking: Reported on 04/09/2016) 100 mL 0   No facility-administered medications prior to visit.     Review of Systems;  Patient denies headache, fevers, malaise, unintentional weight loss, skin rash, eye pain, sinus congestion and sinus pain, sore throat, dysphagia,  hemoptysis , cough, dyspnea, wheezing, chest pain, palpitations, orthopnea, edema, abdominal pain, nausea, melena, diarrhea, constipation, flank pain, dysuria, hematuria, urinary  Frequency, nocturia, numbness, tingling, seizures,  Focal weakness, Loss of consciousness,  Tremor, insomnia, depression, anxiety, and suicidal ideation.      Objective:  BP 112/62   Pulse 87   Temp 98.9 F (37.2 C) (Oral)   Resp 12   Ht 5\' 5"  (1.651 m)   Wt 124 lb 8 oz (  56.5 kg)   SpO2 96%   BMI 20.72 kg/m   BP Readings from Last 3 Encounters:  04/09/16 112/62  01/09/16 132/88  11/11/15 (!) 161/94    Wt Readings from Last 3 Encounters:  04/09/16 124 lb 8 oz (56.5 kg)  01/09/16 135 lb 3.2 oz (61.3 kg)  11/11/15 134 lb (60.8 kg)    General appearance: alert, cooperative and appears stated age Ears: normal TM's and external ear canals both ears Throat: lips, mucosa, and tongue normal; teeth and gums normal Neck: no adenopathy, no carotid bruit, supple, symmetrical,  trachea midline and thyroid not enlarged, symmetric, no tenderness/mass/nodules Back: symmetric, no curvature. ROM normal. No CVA tenderness. Lungs: clear to auscultation bilaterally Heart: regular rate and rhythm, S1, S2 normal, no murmur, click, rub or gallop Abdomen: soft, non-tender; bowel sounds normal; no masses,  no organomegaly Pulses: 2+ and symmetric Skin: Skin color, texture, turgor normal. No rashes or lesions Lymph nodes: Cervical, supraclavicular, and axillary nodes normal.  No results found for: HGBA1C  Lab Results  Component Value Date   CREATININE 1.31 (H) 03/28/2016   CREATININE 1.05 (H) 11/11/2015   CREATININE 1.19 11/04/2015    Lab Results  Component Value Date   WBC 4.3 11/11/2015   HGB 12.4 11/11/2015   HCT 36.2 11/11/2015   PLT 188 11/11/2015   GLUCOSE 105 (H) 03/28/2016   CHOL 153 03/28/2016   TRIG 102.0 03/28/2016   HDL 60.20 03/28/2016   LDLDIRECT 94.0 11/25/2014   LDLCALC 72 03/28/2016   ALT 10 03/28/2016   AST 15 03/28/2016   NA 138 03/28/2016   K 4.6 03/28/2016   CL 106 03/28/2016   CREATININE 1.31 (H) 03/28/2016   BUN 25 (H) 03/28/2016   CO2 26 03/28/2016   TSH 0.44 11/04/2015   INR 1.00 05/03/2015    Dg Chest 2 View  Result Date: 11/11/2015 CLINICAL DATA:  Cough, dizziness and weakness, COPD EXAM: CHEST  2 VIEW COMPARISON:  05/03/2015 FINDINGS: Hyperinflation noted without focal pneumonia, collapse or consolidation. No edema, effusion or pneumothorax. Normal heart size and vascularity. Aorta is atherosclerotic and tortuous. Small hiatal hernia noted. Apical scarring also present. Degenerative thoracic spondylosis diffusely. Compression fracture noted at approximately T12 on the lateral view, age indeterminate. There is scoliosis of the spine as well. IMPRESSION: Chronic COPD/emphysema without superimposed acute chest process. Thoracic aortic atherosclerosis Small hiatal hernia Electronically Signed   By: Jerilynn Mages.  Shick M.D.   On: 11/11/2015 15:07     Ct Head Wo Contrast  Result Date: 11/11/2015 CLINICAL DATA:  Headache. EXAM: CT HEAD WITHOUT CONTRAST TECHNIQUE: Contiguous axial images were obtained from the base of the skull through the vertex without intravenous contrast. COMPARISON:  09/17/2013 MRI.  06/25/2011 CT. FINDINGS: Brain: Moderate low density in the periventricular white matter likely related to small vessel disease. Dilated perivascular space versus remote lacunar infarct in the right thalamus. No mass lesion, hemorrhage, hydrocephalus, acute infarct, intra-axial, or extra-axial fluid collection. Vascular: No hyperdense vessel or unexpected calcification. Skull: Negative for fracture or focal lesion. Sinuses/Orbits: Clear paranasal sinuses and mastoid air cells. Cerumen in the left external ear canal. Normal orbits and globes. Other: None. IMPRESSION: 1.  No acute intracranial abnormality. 2. Small vessel ischemic change. 3. No explanation for headache. Electronically Signed   By: Abigail Miyamoto M.D.   On: 11/11/2015 15:01    Assessment & Plan:   Problem List Items Addressed This Visit    Aortic aneurysm, abdominal ([place])    S.p open repair  in 2010.        Bursitis of left hip    She has mild DJD and spurring by Dec 2015 plain films. She is unable to to ambulate safely without assistance and is receiving intraarticular injections by [place].       Chronic pain syndrome    Secondary to DJD left hip and lumbago  With osteoporotic fractures. Her pain is managed with melxicam , twice daily vicodin. Advised to add 1000 mg tylenol to current regimen to avoid escalation of narcotic use. Refills for 3 months of Vicodin was given after refill history confirmed via [place] Controlled Substance databas, accessed by me today..      Coronary artery disease due to lipid rich plaque    S/p NSTEMI ,    Risk factors have been addressed but she continues to smoke       Vascular dementia with depressed mood    She has anhedonia, loss of appetite, Will  increase paxil to 30 mg and mirtazipine to 45 mg . Reexamined her use of valium and reduced dose to 2.5 mg bid. RTC one month      Relevant Medications   mirtazapine (REMERON) 45 MG tablet   PARoxetine (PAXIL) 30 MG tablet   diazepam (VALIUM) 5 MG tablet      I have discontinued Ms. Cassis's nystatin. I have also changed her mirtazapine, PARoxetine, and diazepam. Additionally, I am having her maintain her budesonide-formoterol, meloxicam, omeprazole, lisinopril, atorvastatin, albuterol, HYDROcodone-acetaminophen, HYDROcodone-acetaminophen, and HYDROcodone-acetaminophen.  Meds ordered this encounter  Medications  . DISCONTD: HYDROcodone-acetaminophen (NORCO/VICODIN) 5-325 MG tablet    Sig: Take 1 tablet by mouth every 6 (six) hours as needed for moderate pain. Maximum 2 daily.    Dispense:  62 tablet    Refill:  0    For refill on or after April 09 2016  . DISCONTD: HYDROcodone-acetaminophen (NORCO/VICODIN) 5-325 MG tablet    Sig: Take 1 tablet by mouth every 6 (six) hours as needed for moderate pain. Maximum 2 daily.    Dispense:  62 tablet    Refill:  0    For refill on or after May 11 2015  . DISCONTD: HYDROcodone-acetaminophen (NORCO/VICODIN) 5-325 MG tablet    Sig: Take 1 tablet by mouth every 6 (six) hours as needed for moderate pain. Maximum 2 daily.    Dispense:  50 tablet    Refill:  0    For refill on or after Feburary 18 2017  . HYDROcodone-acetaminophen (NORCO/VICODIN) 5-325 MG tablet    Sig: Take 1 tablet by mouth every 6 (six) hours as needed for moderate pain. Maximum 2 daily.    Dispense:  62 tablet    Refill:  0    For refill on or after April 09 2016  . HYDROcodone-acetaminophen (NORCO/VICODIN) 5-325 MG tablet    Sig: Take 1 tablet by mouth every 6 (six) hours as needed for moderate pain. Maximum 2 daily.    Dispense:  62 tablet    Refill:  0    For refill on or after May 10 2016  . HYDROcodone-acetaminophen (NORCO/VICODIN) 5-325 MG tablet     Sig: Take 1 tablet by mouth every 6 (six) hours as needed for moderate pain. Maximum 2 daily.    Dispense:  56 tablet    Refill:  0    For refill on or after Feburary 18 2018  . mirtazapine (REMERON) 45 MG tablet    Sig: Take 1 tablet (45 mg total) by mouth at  bedtime.    Dispense:  90 tablet    Refill:  5  . PARoxetine (PAXIL) 30 MG tablet    Sig: Take 1 tablet (30 mg total) by mouth daily.    Dispense:  90 tablet    Refill:  1  . diazepam (VALIUM) 5 MG tablet    Sig: Take 0.5 tablets (2.5 mg total) by mouth 2 (two) times daily as needed.    Dispense:  30 tablet    Refill:  5    Refill for 30 days only.  OFFICE VISIT NEEDED prior to any more refills    Medications Discontinued During This Encounter  Medication Reason  . HYDROcodone-acetaminophen (NORCO/VICODIN) 5-325 MG tablet Reorder  . HYDROcodone-acetaminophen (NORCO/VICODIN) 5-325 MG tablet Reorder  . HYDROcodone-acetaminophen (NORCO/VICODIN) 5-325 MG tablet Reorder  . nystatin (MYCOSTATIN) 100000 UNIT/ML suspension Completed Course  . HYDROcodone-acetaminophen (NORCO/VICODIN) 5-325 MG tablet Reorder  . mirtazapine (REMERON) 30 MG tablet Reorder  . PARoxetine (PAXIL) 20 MG tablet Reorder  . diazepam (VALIUM) 5 MG tablet Reorder    Follow-up: Return in about 4 weeks (around 05/07/2016).   Crecencio Mc, MD

## 2016-04-10 NOTE — Assessment & Plan Note (Signed)
S.p open repair in 2010.

## 2016-04-10 NOTE — Assessment & Plan Note (Addendum)
She has anhedonia, loss of appetite, Will increase paxil to 30 mg and mirtazipine to 45 mg . Reexamined her use of valium and reduced dose to 2.5 mg bid. RTC one month

## 2016-04-10 NOTE — Assessment & Plan Note (Signed)
Secondary to DJD left hip and lumbago  With osteoporotic fractures. Her pain is managed with melxicam , twice daily vicodin. Advised to add 1000 mg tylenol to current regimen to avoid escalation of narcotic use. Refills for 3 months of Vicodin was given after refill history confirmed via [place] Controlled Substance databas, accessed by me today.Anna Prince

## 2016-04-10 NOTE — Assessment & Plan Note (Signed)
S/p NSTEMI ,    Risk factors have been addressed but she continues to smoke

## 2016-04-10 NOTE — Assessment & Plan Note (Signed)
She has mild DJD and spurring by Dec 2015 plain films. She is unable to to ambulate safely without assistance and is receiving intraarticular injections by [place].

## 2016-05-16 ENCOUNTER — Ambulatory Visit (INDEPENDENT_AMBULATORY_CARE_PROVIDER_SITE_OTHER): Payer: Medicare Other | Admitting: Internal Medicine

## 2016-05-16 ENCOUNTER — Encounter: Payer: Self-pay | Admitting: Internal Medicine

## 2016-05-16 VITALS — BP 120/78 | HR 102 | Temp 98.7°F | Resp 16 | Ht 65.0 in | Wt 118.0 lb

## 2016-05-16 DIAGNOSIS — G894 Chronic pain syndrome: Secondary | ICD-10-CM | POA: Diagnosis not present

## 2016-05-16 DIAGNOSIS — G8929 Other chronic pain: Secondary | ICD-10-CM

## 2016-05-16 DIAGNOSIS — F419 Anxiety disorder, unspecified: Secondary | ICD-10-CM

## 2016-05-16 DIAGNOSIS — M545 Low back pain: Secondary | ICD-10-CM

## 2016-05-16 DIAGNOSIS — J9611 Chronic respiratory failure with hypoxia: Secondary | ICD-10-CM | POA: Diagnosis not present

## 2016-05-16 DIAGNOSIS — F331 Major depressive disorder, recurrent, moderate: Secondary | ICD-10-CM

## 2016-05-16 DIAGNOSIS — E7800 Pure hypercholesterolemia, unspecified: Secondary | ICD-10-CM

## 2016-05-16 MED ORDER — HYDROCODONE-ACETAMINOPHEN 5-325 MG PO TABS: 1.0000 | ORAL_TABLET | Freq: Four times a day (QID) | ORAL | 0 refills | Status: DC | PRN

## 2016-05-16 MED ORDER — MEGESTROL ACETATE 40 MG PO TABS: 40.0000 mg | ORAL_TABLET | Freq: Every day | ORAL | 5 refills | Status: DC

## 2016-05-16 NOTE — Patient Instructions (Signed)
I have printed a prescription for Megace.  This is a medication that may help stimulate you appetite by giving you the active ingredient in marijuana.    I DO NOT CONDONE OR RECOMMEND SMOKING POT! doing this willViolate your controlled substance contract and will jeopardize your refills on vicodin

## 2016-05-16 NOTE — Progress Notes (Signed)
Subjective:  Patient ID: Anna Prince, female    DOB: 04-16-1932  Age: 83 y.o. MRN: [id number]  CC: The primary encounter diagnosis was Pure hypercholesterolemia. Diagnoses of Chronic respiratory failure with hypoxia (HCC), Chronic pain syndrome, Anxiety, Major depressive disorder, recurrent episode, moderate ([place]), and Chronic midline low back pain without sciatica were also pertinent to this visit.  HPI Anna Prince presents for FOLLOW UP ON CHRONIC conditions including advanced COPD ,  Low back and left hip pain secondary to DJD and osteoporosis with prior fractures,  And GAD/depression with ongoing weight loss.     She  has become more dyspneic with minimal  exertion,   Room air sats  Were noted to drop to 89% at time of rooming,  Increased to 95% with several minutes of  Rest. At home she is short of breath walking from her bedroom to her kitchen. She wakes up gasping for air most nights,  Feels better in the cold air.      Her  Chronic pain is controlled with 2 vicodin daily . Marland KitchenShe has not had any ER visits  And has not requested any early refills.  Her Refill history was confirmed via [place] Controlled Substance database by me today during her visit and there have been no prescriptions of controlled substances filled from any providers other than me. Marland Kitchen    GAD/depression :  She Has lost 6 more lbs.  Since last visit 3 months ago despite tolerating the maximal dose of remeron .  She and daughter report that her mood has improved,  but she continues to have decreased  appetite   Outpatient Medications Prior to Visit  Medication Sig Dispense Refill  . albuterol (PROVENTIL HFA;VENTOLIN HFA) 108 (90 Base) MCG/ACT inhaler Inhale 2 puffs into the lungs every 6 (six) hours as needed for wheezing or shortness of breath. 1 Inhaler 2  . atorvastatin (LIPITOR) 40 MG tablet Take 1 tablet (40 mg total) by mouth daily. 90 tablet 3  . budesonide-formoterol (SYMBICORT) 80-4.5 MCG/ACT inhaler Inhale  2 puffs into the lungs 2 (two) times daily. 1 Inhaler 12  . diazepam (VALIUM) 5 MG tablet Take 0.5 tablets (2.5 mg total) by mouth 2 (two) times daily as needed. 30 tablet 5  . lisinopril (PRINIVIL,ZESTRIL) 20 MG tablet TAKE 1 TABLET BY MOUTH EVERY DAY 90 tablet 2  . meloxicam (MOBIC) 15 MG tablet Take 1 tablet (15 mg total) by mouth daily. 30 tablet 5  . mirtazapine (REMERON) 45 MG tablet Take 1 tablet (45 mg total) by mouth at bedtime. 90 tablet 5  . omeprazole (PRILOSEC) 40 MG capsule TAKE 1 CAPSULE BY MOUTH DAILY. 90 capsule 2  . PARoxetine (PAXIL) 30 MG tablet Take 1 tablet (30 mg total) by mouth daily. 90 tablet 1  . HYDROcodone-acetaminophen (NORCO/VICODIN) 5-325 MG tablet Take 1 tablet by mouth every 6 (six) hours as needed for moderate pain. Maximum 2 daily. 62 tablet 0  . HYDROcodone-acetaminophen (NORCO/VICODIN) 5-325 MG tablet Take 1 tablet by mouth every 6 (six) hours as needed for moderate pain. Maximum 2 daily. 62 tablet 0  . HYDROcodone-acetaminophen (NORCO/VICODIN) 5-325 MG tablet Take 1 tablet by mouth every 6 (six) hours as needed for moderate pain. Maximum 2 daily. 56 tablet 0   No facility-administered medications prior to visit.     Review of Systems;  Patient denies headache, fevers, malaise, unintentional weight loss, skin rash, eye pain, sinus congestion and sinus pain, sore throat, dysphagia,  hemoptysis , cough,  dyspnea, wheezing, chest pain, palpitations, orthopnea, edema, abdominal pain, nausea, melena, diarrhea, constipation, flank pain, dysuria, hematuria, urinary  Frequency, nocturia, numbness, tingling, seizures,  Focal weakness, Loss of consciousness,  Tremor, insomnia, depression, anxiety, and suicidal ideation.      Objective:  BP 120/78   Pulse (!) 102   Temp 98.7 F (37.1 C) (Oral)   Resp 16   Ht 5\' 5"  (1.651 m)   Wt 118 lb (53.5 kg)   SpO2 98%   BMI 19.64 kg/m   BP Readings from Last 3 Encounters:  05/16/16 120/78  04/09/16 112/62  01/09/16  132/88    Wt Readings from Last 3 Encounters:  05/16/16 118 lb (53.5 kg)  04/09/16 124 lb 8 oz (56.5 kg)  01/09/16 135 lb 3.2 oz (61.3 kg)    General appearance: alert, cooperative and appears stated age Ears: normal TM's and external ear canals both ears Throat: lips, mucosa, and tongue normal; teeth and gums normal Neck: no adenopathy, no carotid bruit, supple, symmetrical, trachea midline and thyroid not enlarged, symmetric, no tenderness/mass/nodules Back: symmetric, no curvature. ROM normal. No CVA tenderness. Lungs: clear to auscultation bilaterally Heart: regular rate and rhythm, S1, S2 normal, no murmur, click, rub or gallop Abdomen: soft, non-tender; bowel sounds normal; no masses,  no organomegaly Pulses: 2+ and symmetric Skin: Skin color, texture, turgor normal. No rashes or lesions Lymph nodes: Cervical, supraclavicular, and axillary nodes normal.  No results found for: HGBA1C  Lab Results  Component Value Date   CREATININE 1.31 (H) 03/28/2016   CREATININE 1.05 (H) 11/11/2015   CREATININE 1.19 11/04/2015    Lab Results  Component Value Date   WBC 4.3 11/11/2015   HGB 12.4 11/11/2015   HCT 36.2 11/11/2015   PLT 188 11/11/2015   GLUCOSE 105 (H) 03/28/2016   CHOL 153 03/28/2016   TRIG 102.0 03/28/2016   HDL 60.20 03/28/2016   LDLDIRECT 94.0 11/25/2014   LDLCALC 72 03/28/2016   ALT 10 03/28/2016   AST 15 03/28/2016   NA 138 03/28/2016   K 4.6 03/28/2016   CL 106 03/28/2016   CREATININE 1.31 (H) 03/28/2016   BUN 25 (H) 03/28/2016   CO2 26 03/28/2016   TSH 0.44 11/04/2015   INR 1.00 05/03/2015    Dg Chest 2 View  Result Date: 11/11/2015 CLINICAL DATA:  Cough, dizziness and weakness, COPD EXAM: CHEST  2 VIEW COMPARISON:  05/03/2015 FINDINGS: Hyperinflation noted without focal pneumonia, collapse or consolidation. No edema, effusion or pneumothorax. Normal heart size and vascularity. Aorta is atherosclerotic and tortuous. Small hiatal hernia noted. Apical  scarring also present. Degenerative thoracic spondylosis diffusely. Compression fracture noted at approximately T12 on the lateral view, age indeterminate. There is scoliosis of the spine as well. IMPRESSION: Chronic COPD/emphysema without superimposed acute chest process. Thoracic aortic atherosclerosis Small hiatal hernia Electronically Signed   By: Jerilynn Mages.  Shick M.D.   On: 11/11/2015 15:07   Ct Head Wo Contrast  Result Date: 11/11/2015 CLINICAL DATA:  Headache. EXAM: CT HEAD WITHOUT CONTRAST TECHNIQUE: Contiguous axial images were obtained from the base of the skull through the vertex without intravenous contrast. COMPARISON:  09/17/2013 MRI.  06/25/2011 CT. FINDINGS: Brain: Moderate low density in the periventricular white matter likely related to small vessel disease. Dilated perivascular space versus remote lacunar infarct in the right thalamus. No mass lesion, hemorrhage, hydrocephalus, acute infarct, intra-axial, or extra-axial fluid collection. Vascular: No hyperdense vessel or unexpected calcification. Skull: Negative for fracture or focal lesion. Sinuses/Orbits: Clear paranasal sinuses and  mastoid air cells. Cerumen in the left external ear canal. Normal orbits and globes. Other: None. IMPRESSION: 1.  No acute intracranial abnormality. 2. Small vessel ischemic change. 3. No explanation for headache. Electronically Signed   By: Abigail Miyamoto M.D.   On: 11/11/2015 15:01    Assessment & Plan:   Problem List Items Addressed This Visit    Anxiety    Managed with Paxil and low dose valium, aggravated by air hunger . Refill history confirmed via [place] Controlled Substance databas, accessed by me today..      Chronic pain syndrome    Secondary to DJD left hip and lumbagoWith osteoporotic fractures. Her pain is managed with meloxicam , twice daily vicodin, and up to 1000 mg tylenol  Refills for 3 months of Vicodin was given after refill history confirmed via [place] Controlled Substance databas, accessed by me  today..      Chronic respiratory failure with hypoxia (HCC)    She has had deterioration of lung function and is now hypoxic with mild exertion.  Her ambulatory sats today in the office drop to well below 88% and she is symptomatic with distances of < 25 feet.  Home/ portable  oxygen will be ordered for use with any travel or ambulation to prevent rapid deterioration of condition        Relevant Orders   For home use only DME oxygen   Hyperlipidemia - Primary   Relevant Orders   Comprehensive metabolic panel   LDL cholesterol, direct   Lipid panel   Lumbago    Her back pain has been aggravated by a history of pubic ramus fracture .  Continue current regimen of vicodin twice daily,  meloxicam and tylenol.       Relevant Medications   HYDROcodone-acetaminophen (NORCO/VICODIN) 5-325 MG tablet   HYDROcodone-acetaminophen (NORCO/VICODIN) 5-325 MG tablet   HYDROcodone-acetaminophen (NORCO/VICODIN) 5-325 MG tablet   Major depressive disorder, recurrent episode, moderate (HCC)    Patient's symptoms are controlled  with Paxil and remeron, but she continues to have a poor appetite and ongoing weight loss.  Trial of Megace discussed         I am having Ms. Chargois start on megestrol. I am also having her maintain her budesonide-formoterol, meloxicam, omeprazole, lisinopril, atorvastatin, albuterol, mirtazapine, PARoxetine, diazepam, HYDROcodone-acetaminophen, HYDROcodone-acetaminophen, and HYDROcodone-acetaminophen.  Meds ordered this encounter  Medications  . megestrol (MEGACE) 40 MG tablet    Sig: Take 1 tablet (40 mg total) by mouth daily.    Dispense:  30 tablet    Refill:  5  . HYDROcodone-acetaminophen (NORCO/VICODIN) 5-325 MG tablet    Sig: Take 1 tablet by mouth every 6 (six) hours as needed for moderate pain. Maximum 2 daily.    Dispense:  62 tablet    Refill:  0    For refill on or after Sep 07 2016  . HYDROcodone-acetaminophen (NORCO/VICODIN) 5-325 MG tablet    Sig: Take 1  tablet by mouth every 6 (six) hours as needed for moderate pain. Maximum 2 daily.    Dispense:  62 tablet    Refill:  0    For refill on or after August 08 2016  . HYDROcodone-acetaminophen (NORCO/VICODIN) 5-325 MG tablet    Sig: Take 1 tablet by mouth every 6 (six) hours as needed for moderate pain. Maximum 2 daily.    Dispense:  56 tablet    Refill:  0    For refill on or after July 08 2016    Medications  Discontinued During This Encounter  Medication Reason  . HYDROcodone-acetaminophen (NORCO/VICODIN) 5-325 MG tablet Reorder  . HYDROcodone-acetaminophen (NORCO/VICODIN) 5-325 MG tablet Reorder  . HYDROcodone-acetaminophen (NORCO/VICODIN) 5-325 MG tablet Reorder    Follow-up: No Follow-up on file.   Crecencio Mc, MD

## 2016-05-16 NOTE — Progress Notes (Signed)
Pre-visit discussion using our clinic review tool. No additional management support is needed unless otherwise documented below in the visit note.  

## 2016-05-17 ENCOUNTER — Telehealth: Payer: Self-pay | Admitting: Internal Medicine

## 2016-05-17 DIAGNOSIS — J9611 Chronic respiratory failure with hypoxia: Secondary | ICD-10-CM | POA: Insufficient documentation

## 2016-05-17 MED ORDER — BUDESONIDE-FORMOTEROL FUMARATE 80-4.5 MCG/ACT IN AERO: 2.0000 | INHALATION_SPRAY | Freq: Two times a day (BID) | RESPIRATORY_TRACT | 12 refills | Status: DC

## 2016-05-17 NOTE — Assessment & Plan Note (Signed)
She has had deterioration of lung function and is now hypoxic with mild exertion.  Her ambulatory sats today in the office drop to well below 88% and she is symptomatic with distances of < 25 feet.  Home/ portable  oxygen will be ordered for use with any travel or ambulation to prevent rapid deterioration of condition

## 2016-05-17 NOTE — Telephone Encounter (Signed)
Pt called and is requesting a refill on Symbicort, and they would like to know if it can be done today. They are going into town this afternoon. Please call when completed. Please advise, thank you!  Pharmacy - [facility] [place], [place] - [address]  Call pt @ [phone]

## 2016-05-17 NOTE — Assessment & Plan Note (Signed)
Patient's symptoms are controlled  with Paxil and remeron, but she continues to have a poor appetite and ongoing weight loss.  Trial of Megace discussed

## 2016-05-17 NOTE — Assessment & Plan Note (Signed)
Secondary to DJD left hip and lumbago With osteoporotic fractures. Her pain is managed with meloxicam , twice daily vicodin, and up to 1000 mg tylenol  Refills for 3 months of Vicodin was given after refill history confirmed via  Controlled Substance databas, accessed by me today.. 

## 2016-05-17 NOTE — Telephone Encounter (Signed)
Refill sent to pharmacy.   

## 2016-05-17 NOTE — Assessment & Plan Note (Signed)
Managed with Paxil and low dose valium, aggravated by air hunger . Refill history confirmed via [place] Controlled Substance databas, accessed by me today.. 

## 2016-05-17 NOTE — Assessment & Plan Note (Signed)
Her back pain has been aggravated by a history of pubic ramus fracture .  Continue current regimen of vicodin twice daily,  meloxicam and tylenol.

## 2016-05-18 ENCOUNTER — Telehealth: Payer: Self-pay | Admitting: Internal Medicine

## 2016-05-18 NOTE — Telephone Encounter (Signed)
Anna Prince please look into this and update patient

## 2016-05-18 NOTE — Telephone Encounter (Signed)
Please advise 

## 2016-05-18 NOTE — Telephone Encounter (Signed)
Pt daughter called and stated that they have no yet heard anything in regards to o2. Daughter states that pt's breathing in bad today. Please advise, thank you!  Call Kim @ [phone]

## 2016-05-21 NOTE — Telephone Encounter (Signed)
Pt daughter called back and stated they still have not heard anything. Please advise, thank you!  Call daughter @ [phone]

## 2016-05-21 NOTE — Telephone Encounter (Signed)
Patient daughter notified will call back Wednesday if hadn't heard from [employer]

## 2016-05-22 NOTE — Telephone Encounter (Signed)
Signed and returned to you 

## 2016-05-22 NOTE — Telephone Encounter (Signed)
Documents faxe to [employer]

## 2016-05-22 NOTE — Telephone Encounter (Signed)
Forms in red folder ready to be sign

## 2016-05-24 DIAGNOSIS — J9611 Chronic respiratory failure with hypoxia: Secondary | ICD-10-CM | POA: Diagnosis not present

## 2016-05-24 DIAGNOSIS — J449 Chronic obstructive pulmonary disease, unspecified: Secondary | ICD-10-CM | POA: Diagnosis not present

## 2016-06-21 DIAGNOSIS — J9611 Chronic respiratory failure with hypoxia: Secondary | ICD-10-CM | POA: Diagnosis not present

## 2016-06-21 DIAGNOSIS — J449 Chronic obstructive pulmonary disease, unspecified: Secondary | ICD-10-CM | POA: Diagnosis not present

## 2016-07-09 ENCOUNTER — Other Ambulatory Visit: Payer: Self-pay | Admitting: Internal Medicine

## 2016-07-22 DIAGNOSIS — J449 Chronic obstructive pulmonary disease, unspecified: Secondary | ICD-10-CM | POA: Diagnosis not present

## 2016-07-22 DIAGNOSIS — J9611 Chronic respiratory failure with hypoxia: Secondary | ICD-10-CM | POA: Diagnosis not present

## 2016-08-06 ENCOUNTER — Ambulatory Visit: Payer: Medicare Other

## 2016-08-06 ENCOUNTER — Other Ambulatory Visit: Payer: Self-pay | Admitting: Internal Medicine

## 2016-08-15 ENCOUNTER — Ambulatory Visit: Payer: Medicare Other | Admitting: Internal Medicine

## 2016-08-21 DIAGNOSIS — J9611 Chronic respiratory failure with hypoxia: Secondary | ICD-10-CM | POA: Diagnosis not present

## 2016-08-21 DIAGNOSIS — J449 Chronic obstructive pulmonary disease, unspecified: Secondary | ICD-10-CM | POA: Diagnosis not present

## 2016-08-24 ENCOUNTER — Inpatient Hospital Stay
Admission: EM | Admit: 2016-08-24 | Discharge: 2016-08-28 | DRG: 282 | Disposition: A | Discharge status: Other Acute Inpatient Hospital | Payer: Medicare Other | Attending: Internal Medicine | Admitting: Internal Medicine

## 2016-08-24 ENCOUNTER — Emergency Department: Payer: Medicare Other

## 2016-08-24 ENCOUNTER — Ambulatory Visit (INDEPENDENT_AMBULATORY_CARE_PROVIDER_SITE_OTHER): Payer: Medicare Other | Admitting: Internal Medicine

## 2016-08-24 ENCOUNTER — Encounter: Payer: Self-pay | Admitting: Internal Medicine

## 2016-08-24 VITALS — BP 114/66 | HR 74 | Temp 97.0°F | Resp 16 | Ht 65.0 in | Wt 122.6 lb

## 2016-08-24 DIAGNOSIS — E785 Hyperlipidemia, unspecified: Secondary | ICD-10-CM | POA: Diagnosis not present

## 2016-08-24 DIAGNOSIS — J449 Chronic obstructive pulmonary disease, unspecified: Secondary | ICD-10-CM | POA: Diagnosis present

## 2016-08-24 DIAGNOSIS — F17210 Nicotine dependence, cigarettes, uncomplicated: Secondary | ICD-10-CM | POA: Diagnosis present

## 2016-08-24 DIAGNOSIS — Z885 Allergy status to narcotic agent status: Secondary | ICD-10-CM | POA: Diagnosis not present

## 2016-08-24 DIAGNOSIS — Z8249 Family history of ischemic heart disease and other diseases of the circulatory system: Secondary | ICD-10-CM

## 2016-08-24 DIAGNOSIS — K219 Gastro-esophageal reflux disease without esophagitis: Secondary | ICD-10-CM | POA: Diagnosis present

## 2016-08-24 DIAGNOSIS — H3530 Unspecified macular degeneration: Secondary | ICD-10-CM | POA: Diagnosis not present

## 2016-08-24 DIAGNOSIS — E860 Dehydration: Secondary | ICD-10-CM | POA: Diagnosis present

## 2016-08-24 DIAGNOSIS — R002 Palpitations: Secondary | ICD-10-CM

## 2016-08-24 DIAGNOSIS — F329 Major depressive disorder, single episode, unspecified: Secondary | ICD-10-CM | POA: Diagnosis present

## 2016-08-24 DIAGNOSIS — Z7951 Long term (current) use of inhaled steroids: Secondary | ICD-10-CM | POA: Diagnosis not present

## 2016-08-24 DIAGNOSIS — R7989 Other specified abnormal findings of blood chemistry: Secondary | ICD-10-CM | POA: Diagnosis not present

## 2016-08-24 DIAGNOSIS — G894 Chronic pain syndrome: Secondary | ICD-10-CM

## 2016-08-24 DIAGNOSIS — I25110 Atherosclerotic heart disease of native coronary artery with unstable angina pectoris: Secondary | ICD-10-CM

## 2016-08-24 DIAGNOSIS — Z79899 Other long term (current) drug therapy: Secondary | ICD-10-CM

## 2016-08-24 DIAGNOSIS — K2270 Barrett's esophagus without dysplasia: Secondary | ICD-10-CM | POA: Diagnosis not present

## 2016-08-24 DIAGNOSIS — I160 Hypertensive urgency: Secondary | ICD-10-CM | POA: Diagnosis present

## 2016-08-24 DIAGNOSIS — I10 Essential (primary) hypertension: Secondary | ICD-10-CM | POA: Diagnosis present

## 2016-08-24 DIAGNOSIS — I214 Non-ST elevation (NSTEMI) myocardial infarction: Principal | ICD-10-CM | POA: Diagnosis present

## 2016-08-24 DIAGNOSIS — R079 Chest pain, unspecified: Secondary | ICD-10-CM | POA: Diagnosis not present

## 2016-08-24 LAB — BASIC METABOLIC PANEL
ANION GAP: 8 (ref 5–15)
BUN: 27 mg/dL — ABNORMAL HIGH (ref 6–20)
CO2: 22 mmol/L (ref 22–32)
Calcium: 9 mg/dL (ref 8.9–10.3)
Chloride: 106 mmol/L (ref 101–111)
Creatinine, Ser: 1.26 mg/dL — ABNORMAL HIGH (ref 0.44–1.00)
GFR calc Af Amer: 45 mL/min — ABNORMAL LOW (ref >60)
GFR calc non Af Amer: 39 mL/min — ABNORMAL LOW (ref >60)
GLUCOSE: 96 mg/dL (ref 65–99)
POTASSIUM: 4 mmol/L (ref 3.5–5.1)
Sodium: 136 mmol/L (ref 135–145)

## 2016-08-24 LAB — CBC
HEMATOCRIT: 29.8 % — AB (ref 35.0–47.0)
HEMOGLOBIN: 9.6 g/dL — AB (ref 12.0–16.0)
MCH: 28.5 pg (ref 26.0–34.0)
MCHC: 32 g/dL (ref 32.0–36.0)
MCV: 89 fL (ref 80.0–100.0)
Platelets: 189 10*3/uL (ref 150–440)
RBC: 3.35 MIL/uL — ABNORMAL LOW (ref 3.80–5.20)
RDW: 14 % (ref 11.5–14.5)
WBC: 5.7 10*3/uL (ref 3.6–11.0)

## 2016-08-24 LAB — TROPONIN I
TROPONIN I: 0.91 ng/mL — AB (ref <0.03)
Troponin I: 1.15 ng/mL (ref <0.03)

## 2016-08-24 MED ORDER — ACETAMINOPHEN 325 MG PO TABS: 650.0000 mg | ORAL_TABLET | ORAL | Status: DC | PRN

## 2016-08-24 MED ORDER — ONDANSETRON HCL 4 MG/2ML IJ SOLN: 4.0000 mg | Freq: Four times a day (QID) | INTRAMUSCULAR | Status: DC | PRN

## 2016-08-24 MED ORDER — MOMETASONE FURO-FORMOTEROL FUM 100-5 MCG/ACT IN AERO
2.0000 | INHALATION_SPRAY | Freq: Two times a day (BID) | RESPIRATORY_TRACT | Status: DC
  Administered 2016-08-24 – 2016-08-27 (×7): 2 via RESPIRATORY_TRACT
  Filled 2016-08-24: qty 8.8

## 2016-08-24 MED ORDER — HYDROCODONE-ACETAMINOPHEN 5-325 MG PO TABS: 1.0000 | ORAL_TABLET | Freq: Four times a day (QID) | ORAL | 0 refills | Status: DC | PRN

## 2016-08-24 MED ORDER — ASPIRIN 81 MG PO CHEW
CHEWABLE_TABLET | ORAL | Status: AC
  Administered 2016-08-24: 324 mg via ORAL
  Filled 2016-08-24: qty 4

## 2016-08-24 MED ORDER — PANTOPRAZOLE SODIUM 40 MG PO TBEC
40.0000 mg | DELAYED_RELEASE_TABLET | Freq: Every day | ORAL | Status: DC
  Administered 2016-08-25 – 2016-08-27 (×3): 40 mg via ORAL
  Filled 2016-08-24 (×3): qty 1

## 2016-08-24 MED ORDER — ACETAMINOPHEN 325 MG PO TABS: 650.0000 mg | ORAL_TABLET | Freq: Four times a day (QID) | ORAL | Status: DC | PRN

## 2016-08-24 MED ORDER — ASPIRIN 81 MG PO CHEW
324.0000 mg | CHEWABLE_TABLET | Freq: Once | ORAL | Status: AC
  Administered 2016-08-24: 324 mg via ORAL

## 2016-08-24 MED ORDER — MIRTAZAPINE 15 MG PO TABS
45.0000 mg | ORAL_TABLET | Freq: Every day | ORAL | Status: DC
  Administered 2016-08-24 – 2016-08-27 (×4): 45 mg via ORAL
  Filled 2016-08-24 (×4): qty 3

## 2016-08-24 MED ORDER — SODIUM CHLORIDE 0.9 % IV SOLN
INTRAVENOUS | Status: DC
  Administered 2016-08-24: 19:00:00 via INTRAVENOUS

## 2016-08-24 MED ORDER — ASPIRIN 81 MG PO CHEW: 324.0000 mg | CHEWABLE_TABLET | Freq: Once | ORAL | Status: DC

## 2016-08-24 MED ORDER — DIAZEPAM 5 MG PO TABS: 2.5000 mg | ORAL_TABLET | Freq: Two times a day (BID) | ORAL | Status: DC | PRN

## 2016-08-24 MED ORDER — ENOXAPARIN SODIUM 40 MG/0.4ML ~~LOC~~ SOLN
40.0000 mg | SUBCUTANEOUS | Status: DC
  Administered 2016-08-24: 40 mg via SUBCUTANEOUS
  Filled 2016-08-24: qty 0.4

## 2016-08-24 MED ORDER — METOPROLOL TARTRATE 25 MG PO TABS
12.5000 mg | ORAL_TABLET | Freq: Two times a day (BID) | ORAL | Status: DC
  Administered 2016-08-24 – 2016-08-27 (×6): 12.5 mg via ORAL
  Filled 2016-08-24 (×6): qty 1

## 2016-08-24 MED ORDER — ATORVASTATIN CALCIUM 20 MG PO TABS
40.0000 mg | ORAL_TABLET | Freq: Every day | ORAL | Status: DC
  Administered 2016-08-26 – 2016-08-27 (×2): 40 mg via ORAL
  Filled 2016-08-24 (×2): qty 2

## 2016-08-24 MED ORDER — NITROGLYCERIN 0.4 MG SL SUBL: 0.4000 mg | SUBLINGUAL_TABLET | SUBLINGUAL | Status: DC | PRN

## 2016-08-24 MED ORDER — SODIUM CHLORIDE 0.9% FLUSH
3.0000 mL | Freq: Two times a day (BID) | INTRAVENOUS | Status: DC
Start: 2016-08-24 — End: 2016-08-28
  Administered 2016-08-27: 3 mL via INTRAVENOUS

## 2016-08-24 MED ORDER — SODIUM CHLORIDE 0.9% FLUSH: 3.0000 mL | INTRAVENOUS | Status: DC | PRN

## 2016-08-24 MED ORDER — SODIUM CHLORIDE 0.9 % IV SOLN: 250.0000 mL | INTRAVENOUS | Status: DC | PRN

## 2016-08-24 MED ORDER — ASPIRIN EC 81 MG PO TBEC
81.0000 mg | DELAYED_RELEASE_TABLET | Freq: Every day | ORAL | Status: DC
  Administered 2016-08-25 – 2016-08-26 (×2): 81 mg via ORAL
  Filled 2016-08-24 (×2): qty 1

## 2016-08-24 MED ORDER — ALBUTEROL SULFATE (2.5 MG/3ML) 0.083% IN NEBU: 3.0000 mL | INHALATION_SOLUTION | Freq: Four times a day (QID) | RESPIRATORY_TRACT | Status: DC | PRN

## 2016-08-24 MED ORDER — LISINOPRIL 20 MG PO TABS
20.0000 mg | ORAL_TABLET | Freq: Every day | ORAL | Status: DC
  Administered 2016-08-25 – 2016-08-27 (×3): 20 mg via ORAL
  Filled 2016-08-24 (×3): qty 1

## 2016-08-24 MED ORDER — HYDROCODONE-ACETAMINOPHEN 5-325 MG PO TABS
1.0000 | ORAL_TABLET | Freq: Four times a day (QID) | ORAL | Status: DC | PRN
  Administered 2016-08-26 – 2016-08-27 (×4): 1 via ORAL
  Filled 2016-08-24 (×4): qty 1

## 2016-08-24 MED ORDER — HYDRALAZINE HCL 20 MG/ML IJ SOLN: 10.0000 mg | Freq: Four times a day (QID) | INTRAMUSCULAR | Status: DC | PRN

## 2016-08-24 NOTE — ED Provider Notes (Signed)
[facility] Emergency Department Provider Note   ____________________________________________   First MD Initiated Contact with Patient 08/24/16 1548     (approximate)  I have reviewed the triage vital signs and the nursing notes.   HISTORY  Chief Complaint Chest Pain    HPI Anna Prince is a 83 y.o. female  here for the evaluation of chest pain that occurred yesterday.  Patient presents for evaluation of chest pain. She reports that yesterday she had an episode of chest discomfort, she took aspirin yesterday and the pain went away after a short period of time  She denies having any chest pain today. Denies shortness of breath. Denies having a history of any heart trouble. She reports the pain was brief, felt like a pressure in the center of her chest.  She did take aspirin yesterday   Past Medical History:  Diagnosis Date  . Anemia 2012   of acute blood loss, resolved  . Anxiety   . Aortic aneurysm ([place])   . Arthritis   . Barrett's esophagus 2007  . Bursitis 2012   left hip, improved with periodic steroid injection [facility] [place])  . COPD (chronic obstructive pulmonary disease) ([place])   . Depression   . GERD (gastroesophageal reflux disease)    with Barretts Esophagus  . Hyperlipidemia   . Hypertension   . Lumbago   . Macular degeneration   . Osteoporosis   . Sigmoid diverticulosis    by colonoscopy    Patient Active Problem List   Diagnosis Date Noted  . Unstable angina pectoris due to coronary arteriosclerosis ([place]) 08/24/2016  . Chronic respiratory failure with hypoxia ([place]) 05/17/2016  . Generalized muscle weakness 04/16/2015  . Hospital discharge follow-up 04/16/2015  . Coronary artery disease due to lipid rich plaque 04/14/2015  . Anxiety 04/14/2015  . Bursitis of left hip 11/27/2014  . Anemia 11/25/2014  . Elevated liver enzymes 08/25/2014  . Pubic ramus fracture ([place]) 08/25/2014  . Vascular dementia with depressed  mood 02/20/2014  . Chronic pain syndrome 05/27/2013  . Tobacco abuse counseling 04/10/2013  . GERD (gastroesophageal reflux disease)   . Screening for colon cancer 06/18/2011  . COPD (chronic obstructive pulmonary disease) ([place]) 06/18/2011  . Skin cancer of nose 12/17/2010  . Vitamin D deficiency 12/17/2010  . Hyperlipidemia LDL goal <100 12/17/2010  . Screening for malignant neoplasm of breast 12/17/2010  . Encounter for long-term (current) use of other medications 12/17/2010  . Aortic aneurysm, abdominal ([place]) 12/17/2010  . Diverticulosis, sigmoid 12/08/2010  . Hyperlipidemia 12/08/2010  . Hypertension 12/08/2010  . Lumbago 12/08/2010  . Osteoporosis 12/08/2010  . Major depressive disorder, recurrent episode, moderate ([place]) 12/08/2010    Past Surgical History:  Procedure Laterality Date  . ABDOMINAL AORTIC ANEURYSM REPAIR  July 2010   [facility]  . APPENDECTOMY    . BACK SURGERY     X2..1975 arachnoid cyst cervical region(blumquist,gso);1997 lumbosacral tumor,9 hr surgery    Prior to Admission medications   Medication Sig Start Date End Date Taking? Authorizing Provider  albuterol (PROVENTIL HFA;VENTOLIN HFA) 108 (90 Base) MCG/ACT inhaler Inhale 2 puffs into the lungs every 6 (six) hours as needed for wheezing or shortness of breath. 11/11/15   Rudene Re, MD  atorvastatin (LIPITOR) 40 MG tablet Take 1 tablet (40 mg total) by mouth daily. 11/08/15   Crecencio Mc, MD  budesonide-formoterol (SYMBICORT) 80-4.5 MCG/ACT inhaler Inhale 2 puffs into the lungs 2 (two) times daily. 05/17/16   Aris Everts  Derrel Nip, MD  diazepam (VALIUM) 5 MG tablet Take 0.5 tablets (2.5 mg total) by mouth 2 (two) times daily as needed. 04/09/16   Crecencio Mc, MD  HYDROcodone-acetaminophen (NORCO/VICODIN) 5-325 MG tablet Take 1 tablet by mouth every 6 (six) hours as needed for moderate pain. Maximum 2 daily. 08/24/16   Crecencio Mc, MD  HYDROcodone-acetaminophen (NORCO/VICODIN) 5-325 MG tablet Take 1 tablet  by mouth every 6 (six) hours as needed for moderate pain. Maximum 2 daily. 08/24/16   Crecencio Mc, MD  HYDROcodone-acetaminophen (NORCO/VICODIN) 5-325 MG tablet Take 1 tablet by mouth every 6 (six) hours as needed for moderate pain. Maximum 2 daily. 08/24/16   Crecencio Mc, MD  lisinopril (PRINIVIL,ZESTRIL) 20 MG tablet TAKE 1 TABLET BY MOUTH EVERY DAY 10/27/15   Crecencio Mc, MD  lisinopril (PRINIVIL,ZESTRIL) 20 MG tablet TAKE 1 TABLET BY MOUTH EVERY DAY 07/09/16   Crecencio Mc, MD  megestrol (MEGACE) 40 MG tablet Take 1 tablet (40 mg total) by mouth daily. 05/16/16   Crecencio Mc, MD  mirtazapine (REMERON) 45 MG tablet Take 1 tablet (45 mg total) by mouth at bedtime. 04/09/16   Crecencio Mc, MD  omeprazole (PRILOSEC) 40 MG capsule TAKE 1 CAPSULE BY MOUTH DAILY. 08/07/16   Crecencio Mc, MD  PARoxetine (PAXIL) 30 MG tablet Take 1 tablet (30 mg total) by mouth daily. 04/09/16   Crecencio Mc, MD    Allergies Morphine sulfate  Family History  Problem Relation Age of Onset  . Coronary artery disease Mother   . Coronary artery disease Father 40  . Cancer Neg Hx     no breast,coon or ovarian ca    Social History Social History  Substance Use Topics  . Smoking status: Current Every Day Smoker    Packs/day: 0.50    Types: Cigarettes  . Smokeless tobacco: Never Used     Comment: HAS BEEN SMOKING 30+ YEARS,RESUMED TOBACCO USE AFTER AAA REPAIR  . Alcohol use No    Review of Systems Constitutional: No fever/chills Eyes: No visual changes. ENT: No sore throat. Cardiovascular: See history of present illness. She and her daughters affirm that she has not had any chest pain or symptoms today Respiratory: Denies shortness of breath. Gastrointestinal: No abdominal pain.  No nausea, no vomiting.  No diarrhea.  No constipation. Genitourinary: Negative for dysuria. Musculoskeletal: Negative for back pain. Skin: Negative for rash. Neurological: Negative for headaches, focal weakness or  numbness.  10-point ROS otherwise negative.  ____________________________________________   PHYSICAL EXAM:  VITAL SIGNS: ED Triage Vitals  Enc Vitals Group     BP 08/24/16 1451 104/89     Pulse Rate 08/24/16 1451 72     Resp 08/24/16 1451 18     Temp 08/24/16 1451 98.5 F (36.9 C)     Temp Source 08/24/16 1451 Oral     SpO2 08/24/16 1451 100 %     Weight 08/24/16 1452 122 lb (55.3 kg)     Height 08/24/16 1452 5\' 5"  (1.651 m)     Head Circumference --      Peak Flow --      Pain Score --      Pain Loc --      Pain Edu? --      Excl. in [place]? --     Constitutional: Alert and oriented. Well appearing and in no acute distress. Eyes: Conjunctivae are normal. PERRL. EOMI. Head: Atraumatic. Nose: No congestion/rhinnorhea. Mouth/Throat: Mucous membranes are  moist.  Oropharynx non-erythematous. Neck: No stridor.   Cardiovascular: Normal rate, regular rhythm. Grossly normal heart sounds.  Good peripheral circulation. Respiratory: Normal respiratory effort.  No retractions. Lungs CTAB. Gastrointestinal: Soft and nontender. No distention. Old midline ventral incision. No distention or tenderness in the abdomen. Patient reports prior aneurysm repair about 10 years ago  Musculoskeletal: No lower extremity tenderness nor edema.  No joint effusions. Neurologic:  Normal speech and language. No gross focal neurologic deficits are appreciated. No gait instability. Skin:  Skin is warm, dry and intact. No rash noted. Psychiatric: Mood and affect are normal. Speech and behavior are normal.  ____________________________________________   LABS (all labs ordered are listed, but only abnormal results are displayed)  Labs Reviewed  BASIC METABOLIC PANEL - Abnormal; Notable for the following:       Result Value   BUN 27 (*)    Creatinine, Ser 1.26 (*)    GFR calc non Af Amer 39 (*)    GFR calc Af Amer 45 (*)    All other components within normal limits  CBC - Abnormal; Notable for the  following:    RBC 3.35 (*)    Hemoglobin 9.6 (*)    HCT 29.8 (*)    All other components within normal limits  TROPONIN I - Abnormal; Notable for the following:    Troponin I 1.15 (*)    All other components within normal limits   ____________________________________________  EKG  Reviewed and interpreted by me at 1450 Ventricular rate 70 QRS 90 QTc 4:30 Normal sinus rhythm, T-wave inversions in a anterior distribution, compared with her previous EKG, EKG abnormality in V3 and V4 is new ____________________________________________  RADIOLOGY  Dg Chest 2 View  Result Date: 08/24/2016 CLINICAL DATA:  Abnormal electrocardiogram.  Chest pain EXAM: CHEST  2 VIEW COMPARISON:  November 11, 2015 FINDINGS: There is no edema or consolidation. Heart size and pulmonary vascularity are within normal limits. There is aortic atherosclerosis. There is left anterior descending coronary artery calcification. No adenopathy. There is thoracolumbar levoscoliosis. IMPRESSION: Aortic atherosclerosis. Left anterior descending coronary artery calcification. Scoliosis. No edema or consolidation. Electronically Signed   By: Lowella Grip III M.D.   On: 08/24/2016 15:22    ____________________________________________   PROCEDURES  Procedure(s) performed: None  Procedures  Critical Care performed: Yes, see critical care note(s)  Patient presents for evaluation of chest pain which occurred yesterday. Troponin is elevated, greater than 1 associated new EKG changes. Discussed in required consultation with cardiology Dr. Ubaldo Glassing, admission.  ____________________________________________   INITIAL IMPRESSION / ASSESSMENT AND PLAN / ED COURSE  Pertinent labs & imaging results that were available during my care of the patient were reviewed by me and considered in my medical decision making (see chart for details).  Chest pain. Occurred yesterday. Troponin is positive, with symptoms concerning for possible  NSTEMI which likely occurred yesterday. She has been a symptomatic today. Cardiology recommends trending troponins, salicylate therapy but withholding anticoagulation therapy unless the patient has further upturn and troponin. We'll monitor closely. Admit to hospital, observe closely for any recurrence of chest pain or CAD symptoms.      ____________________________________________   FINAL CLINICAL IMPRESSION(S) / ED DIAGNOSES  Final diagnoses:  NSTEMI (non-ST elevated myocardial infarction) ([place])      NEW MEDICATIONS STARTED DURING THIS VISIT:  New Prescriptions   No medications on file     Note:  This document was prepared using Dragon voice recognition software and may include unintentional dictation errors.  Delman Kitten, MD 08/24/16 [phone]

## 2016-08-24 NOTE — H&P (Signed)
[place] [facility] [place] NAME: Anna Prince    MR#:  [id number]  DATE OF BIRTH:  [birth year]  DATE OF ADMISSION:  08/24/2016  PRIMARY CARE PHYSICIAN: Crecencio Mc, MD   REQUESTING/REFERRING PHYSICIAN: Delman Kitten, MD  CHIEF COMPLAINT:   Chief Complaint  Patient presents with  . Chest Pain   Chest pain yesterday. HISTORY OF PRESENT ILLNESS:  Anna Prince  is a 83 y.o. female with a known history ofHypertension, Anemia, AAA, status post repair, COPD and hyperlipidemia. The patient had 1 episode of chest pain in substernal area yesterday, which was aching without radiation, resolve after taking aspirin. She denies any nausea, diaphoresis or palpitation. She denies any chest pain today. Her troponin level is elevated at 1.15 today. Dr. Jacqualine Code discussed with cardiologist Dr. Ubaldo Glassing, who suggested started heparin drip if troponin level trend up. The patient was given aspirin 324 mg in the ED. The patient said she had stress since her grandson was diagnosed with colon cancer last week.  PAST MEDICAL HISTORY:   Past Medical History:  Diagnosis Date  . Anemia 2012   of acute blood loss, resolved  . Anxiety   . Aortic aneurysm ([place])   . Arthritis   . Barrett's esophagus 2007  . Bursitis 2012   left hip, improved with periodic steroid injection [facility] , [place])  . COPD (chronic obstructive pulmonary disease) ([place])   . Depression   . GERD (gastroesophageal reflux disease)    with Barretts Esophagus  . Hyperlipidemia   . Hypertension   . Lumbago   . Macular degeneration   . Osteoporosis   . Sigmoid diverticulosis    by colonoscopy    PAST SURGICAL HISTORY:   Past Surgical History:  Procedure Laterality Date  . ABDOMINAL AORTIC ANEURYSM REPAIR  July 2010   [facility]  . APPENDECTOMY    . BACK SURGERY     X2..1975 arachnoid cyst cervical region(blumquist,gso);1997 lumbosacral tumor,9 hr surgery    SOCIAL HISTORY:   Social History    Substance Use Topics  . Smoking status: Current Every Day Smoker    Packs/day: 0.50    Types: Cigarettes  . Smokeless tobacco: Never Used     Comment: HAS BEEN SMOKING 30+ YEARS,RESUMED TOBACCO USE AFTER AAA REPAIR  . Alcohol use No    FAMILY HISTORY:   Family History  Problem Relation Age of Onset  . Coronary artery disease Mother   . Coronary artery disease Father 21  . Cancer Neg Hx     no breast,coon or ovarian ca    DRUG ALLERGIES:   Allergies  Allergen Reactions  . Morphine Sulfate Other (See Comments)    Reaction:  Hallucinations     REVIEW OF SYSTEMS:   Review of Systems  Constitutional: Negative for chills, fever and malaise/fatigue.  HENT: Negative for congestion and sore throat.   Eyes: Negative for blurred vision and double vision.  Respiratory: Negative for cough, shortness of breath, wheezing and stridor.   Cardiovascular: Positive for chest pain. Negative for palpitations, orthopnea and leg swelling.  Gastrointestinal: Negative for abdominal pain, blood in stool, constipation, diarrhea, nausea and vomiting.  Genitourinary: Negative for dysuria, hematuria and urgency.  Musculoskeletal: Negative for back pain.  Skin: Negative for itching and rash.  Neurological: Negative for dizziness, focal weakness, loss of consciousness, weakness and headaches.  Psychiatric/Behavioral: Negative for depression. The patient is not nervous/anxious.     MEDICATIONS AT HOME:   Prior to  Admission medications   Medication Sig Start Date End Date Taking? Authorizing Provider  albuterol (PROVENTIL HFA;VENTOLIN HFA) 108 (90 Base) MCG/ACT inhaler Inhale 2 puffs into the lungs every 6 (six) hours as needed for wheezing or shortness of breath. 11/11/15  Yes Alfred Levins, [place], MD  budesonide-formoterol [facility]) 80-4.5 MCG/ACT inhaler Inhale 2 puffs into the lungs 2 (two) times daily. 05/17/16  Yes Crecencio Mc, MD  diazepam (VALIUM) 5 MG tablet Take 0.5 tablets (2.5 mg total)  by mouth 2 (two) times daily as needed. 04/09/16  Yes Crecencio Mc, MD  HYDROcodone-acetaminophen (NORCO/VICODIN) 5-325 MG tablet Take 1 tablet by mouth every 6 (six) hours as needed for moderate pain. Maximum 2 daily. 08/24/16  Yes Crecencio Mc, MD  lisinopril (PRINIVIL,ZESTRIL) 20 MG tablet TAKE 1 TABLET BY MOUTH EVERY DAY 10/27/15  Yes Crecencio Mc, MD  lovastatin (MEVACOR) 40 MG tablet Take 40 mg by mouth daily. 06/05/16  Yes [provider]  megestrol (MEGACE) 40 MG tablet Take 1 tablet (40 mg total) by mouth daily. 05/16/16  Yes Crecencio Mc, MD  mirtazapine (REMERON) 45 MG tablet Take 1 tablet (45 mg total) by mouth at bedtime. 04/09/16  Yes Crecencio Mc, MD  omeprazole (PRILOSEC) 40 MG capsule TAKE 1 CAPSULE BY MOUTH DAILY. 08/07/16  Yes Crecencio Mc, MD  atorvastatin (LIPITOR) 40 MG tablet Take 1 tablet (40 mg total) by mouth daily. Patient not taking: Reported on 08/24/2016 11/08/15   Crecencio Mc, MD  HYDROcodone-acetaminophen (NORCO/VICODIN) 5-325 MG tablet Take 1 tablet by mouth every 6 (six) hours as needed for moderate pain. Maximum 2 daily. Patient not taking: Reported on 08/24/2016 08/24/16   Crecencio Mc, MD  HYDROcodone-acetaminophen (NORCO/VICODIN) 5-325 MG tablet Take 1 tablet by mouth every 6 (six) hours as needed for moderate pain. Maximum 2 daily. Patient not taking: Reported on 08/24/2016 08/24/16   Crecencio Mc, MD  PARoxetine (PAXIL) 30 MG tablet Take 1 tablet (30 mg total) by mouth daily. Patient not taking: Reported on 08/24/2016 04/09/16   Crecencio Mc, MD      VITAL SIGNS:  Blood pressure (!) 141/71, pulse 71, temperature 98.5 F (36.9 C), temperature source Oral, resp. rate 17, height 5\' 5"  (1.651 m), weight 122 lb (55.3 kg), SpO2 100 %.  PHYSICAL EXAMINATION:  Physical Exam  GENERAL:  83 y.o.-year-old patient lying in the bed with no acute distress.  EYES: Pupils equal, round, reactive to light and accommodation. No scleral icterus.  Extraocular muscles intact.  HEENT: Head atraumatic, normocephalic. Oropharynx and nasopharynx clear.  NECK:  Supple, no jugular venous distention. No thyroid enlargement, no tenderness.  LUNGS: Normal breath sounds bilaterally, no wheezing, rales,rhonchi or crepitation. No use of accessory muscles of respiration.  CARDIOVASCULAR: S1, S2 normal. No murmurs, rubs, or gallops.  ABDOMEN: Soft, nontender, nondistended. Bowel sounds present. No organomegaly or mass.  EXTREMITIES: No pedal edema, cyanosis, or clubbing.  NEUROLOGIC: Cranial nerves II through XII are intact. Muscle strength 5/5 in all extremities. Sensation intact. Gait not checked.  PSYCHIATRIC: The patient is alert and oriented x 3.  SKIN: No obvious rash, lesion, or ulcer.   LABORATORY PANEL:   CBC  Recent Labs Lab 08/24/16 1453  WBC 5.7  HGB 9.6*  HCT 29.8*  PLT 189   ------------------------------------------------------------------------------------------------------------------  Chemistries   Recent Labs Lab 08/24/16 1453  NA 136  K 4.0  CL 106  CO2 22  GLUCOSE 96  BUN 27*  CREATININE 1.26*  CALCIUM 9.0   ------------------------------------------------------------------------------------------------------------------  Cardiac Enzymes  Recent Labs Lab 08/24/16 1453  TROPONINI 1.15*   ------------------------------------------------------------------------------------------------------------------  RADIOLOGY:  Dg Chest 2 View  Result Date: 08/24/2016 CLINICAL DATA:  Abnormal electrocardiogram.  Chest pain EXAM: CHEST  2 VIEW COMPARISON:  November 11, 2015 FINDINGS: There is no edema or consolidation. Heart size and pulmonary vascularity are within normal limits. There is aortic atherosclerosis. There is left anterior descending coronary artery calcification. No adenopathy. There is thoracolumbar levoscoliosis. IMPRESSION: Aortic atherosclerosis. Left anterior descending coronary artery calcification.  Scoliosis. No edema or consolidation. Electronically Signed   By: Lowella Grip III M.D.   On: 08/24/2016 15:22      IMPRESSION AND PLAN:   NSTEMI. The patient will be admitted to medical floor. She was given aspirin 324 mg 1 dose in the ED. I will continue aspirin, Lipitor, follow-up troponin and lipid panel. Follow-up cardiology Dr. Ubaldo Glassing. If troponin level is trending up, start heparin drip.  HTN urgency The patient's blood pressure is 193/160 when I examined the patient. Start Lopressor, continue lisinopril. Start IV hydralazine when necessary.  Dehydration. Start normal saline IV and follow-up BMP.  COPD. Stable. NEB prn. Tobacco abuse. Smoking cessation was counseled for 4 minutes, the patient doesn't want a nicotine patch.  Hyperlipidemia. Continue Lipitor.  All the records are reviewed and case discussed with ED provider. Management plans discussed with the patient, family and they are in agreement.  CODE STATUS: Full code  TOTAL TIME TAKING CARE OF THIS PATIENT: 58 minutes.    Demetrios Loll M.D on 08/24/2016 at 4:43 PM  Between 7am to 6pm - Pager - [phone]  After 6pm go to [email or web address] - Proofreader  [facility]  Office  [phone]  CC: Primary care physician; Crecencio Mc, MD   Note: This dictation was prepared with Dragon dictation along with smaller phrase technology. Any transcriptional errors that result from this process are unintentional.

## 2016-08-24 NOTE — Assessment & Plan Note (Signed)
Secondary to DJD left hip and lumbago With osteoporotic fractures. Her pain is managed with meloxicam , twice daily vicodin, and up to 1000 mg tylenol  Refills for 3 months of Vicodin was given after refill history confirmed via [place] Controlled Substance databas, accessed by me today.Marland Kitchen

## 2016-08-24 NOTE — ED Notes (Signed)
Pt resting in bed, family at bedside, resp even and unlabored, pt aware of pending admission

## 2016-08-24 NOTE — Assessment & Plan Note (Signed)
New onset chest pain at rest in patient with known CAD and new EKG chnages suggesting ischemia .  Patient  Is advised to go to ER;  She is accompanied by 2 daughters who have agreed to take her immediately.   ER Triage RN notified.

## 2016-08-24 NOTE — Progress Notes (Signed)
Pre visit review using our clinic review tool, if applicable. No additional management support is needed unless otherwise documented below in the visit note. 

## 2016-08-24 NOTE — ED Triage Notes (Signed)
Pt sent from [facility] for ischemic changes on EKG. Pt reports central nonradiating chest pain that comes and goes from yesterday. Pt denies pain at present. Pt has history of COPD. Pt reports headache but denies any other symptoms.

## 2016-08-24 NOTE — Progress Notes (Signed)
Subjective:  Patient ID: Anna Prince, female    DOB: 10/03/1931  Age: 83 y.o. MRN: [id number]  CC: The primary encounter diagnosis was Heart palpitations. Diagnoses of Unstable angina pectoris due to coronary arteriosclerosis (HCC) and Chronic pain syndrome were also pertinent to this visit.  HPI Anna Prince presents for FOLLOW UP ON CHRONIC back and hip PAIN MANAGED WITH VICODIN 2 DAILY  Here for 3 month follow up  And medication refill  Cc:  New onset SSCO,  Occurring at rest.   Has been recurrent for several weeks,  Occurring one or twice weekly  No diaphoresis,  Nausea or shortness, . Last episode last evening,  Relieved with FS ASA   EKG changes noted today compared to July EKG:  New Q wave in III,  St depression and TWI in V3 and V4 .  Patient has known CAD  ,  Ongoing  tobacco abuse,  PAD<    Outpatient Medications Prior to Visit  Medication Sig Dispense Refill  . albuterol (PROVENTIL HFA;VENTOLIN HFA) 108 (90 Base) MCG/ACT inhaler Inhale 2 puffs into the lungs every 6 (six) hours as needed for wheezing or shortness of breath. 1 Inhaler 2  . atorvastatin (LIPITOR) 40 MG tablet Take 1 tablet (40 mg total) by mouth daily. 90 tablet 3  . budesonide-formoterol (SYMBICORT) 80-4.5 MCG/ACT inhaler Inhale 2 puffs into the lungs 2 (two) times daily. 1 Inhaler 12  . diazepam (VALIUM) 5 MG tablet Take 0.5 tablets (2.5 mg total) by mouth 2 (two) times daily as needed. 30 tablet 5  . lisinopril (PRINIVIL,ZESTRIL) 20 MG tablet TAKE 1 TABLET BY MOUTH EVERY DAY 90 tablet 2  . lisinopril (PRINIVIL,ZESTRIL) 20 MG tablet TAKE 1 TABLET BY MOUTH EVERY DAY 90 tablet 1  . megestrol (MEGACE) 40 MG tablet Take 1 tablet (40 mg total) by mouth daily. 30 tablet 5  . mirtazapine (REMERON) 45 MG tablet Take 1 tablet (45 mg total) by mouth at bedtime. 90 tablet 5  . omeprazole (PRILOSEC) 40 MG capsule TAKE 1 CAPSULE BY MOUTH DAILY. 90 capsule 0  . PARoxetine (PAXIL) 30 MG tablet Take 1 tablet (30  mg total) by mouth daily. 90 tablet 1  . HYDROcodone-acetaminophen (NORCO/VICODIN) 5-325 MG tablet Take 1 tablet by mouth every 6 (six) hours as needed for moderate pain. Maximum 2 daily. 62 tablet 0  . HYDROcodone-acetaminophen (NORCO/VICODIN) 5-325 MG tablet Take 1 tablet by mouth every 6 (six) hours as needed for moderate pain. Maximum 2 daily. 62 tablet 0  . HYDROcodone-acetaminophen (NORCO/VICODIN) 5-325 MG tablet Take 1 tablet by mouth every 6 (six) hours as needed for moderate pain. Maximum 2 daily. 56 tablet 0  . meloxicam (MOBIC) 15 MG tablet Take 1 tablet (15 mg total) by mouth daily. (Patient not taking: Reported on 08/24/2016) 30 tablet 5   No facility-administered medications prior to visit.     Review of Systems;  Patient denies headache, fevers, malaise, unintentional weight loss, skin rash, eye pain, sinus congestion and sinus pain, sore throat, dysphagia,  hemoptysis , cough, dyspnea, wheezing, chest pain, palpitations, orthopnea, edema, abdominal pain, nausea, melena, diarrhea, constipation, flank pain, dysuria, hematuria, urinary  Frequency, nocturia, numbness, tingling, seizures,  Focal weakness, Loss of consciousness,  Tremor, insomnia, depression, anxiety, and suicidal ideation.      Objective:  BP 114/66 (BP Location: Left Arm, Patient Position: Sitting, Cuff Size: Normal)   Pulse 74   Temp 97 F (36.1 C) (Oral)   Resp 16   Ht  5\' 5"  (1.651 m)   Wt 122 lb 9.6 oz (55.6 kg)   SpO2 99%   BMI 20.40 kg/m   BP Readings from Last 3 Encounters:  08/24/16 114/66  05/16/16 120/78  04/09/16 112/62    Wt Readings from Last 3 Encounters:  08/24/16 122 lb 9.6 oz (55.6 kg)  05/16/16 118 lb (53.5 kg)  04/09/16 124 lb 8 oz (56.5 kg)    General appearance: alert, cooperative and appears stated age Ears: normal TM's and external ear canals both ears Throat: lips, mucosa, and tongue normal; teeth and gums normal Neck: no adenopathy, no carotid bruit, supple, symmetrical,  trachea midline and thyroid not enlarged, symmetric, no tenderness/mass/nodules Back: symmetric, no curvature. ROM normal. No CVA tenderness. Lungs: clear to auscultation bilaterally Heart: regular rate and rhythm, S1, S2 normal, no murmur, click, rub or gallop Abdomen: soft, non-tender; bowel sounds normal; no masses,  no organomegaly Pulses: 2+ and symmetric Skin: Skin color, texture, turgor normal. No rashes or lesions Lymph nodes: Cervical, supraclavicular, and axillary nodes normal.  No results found for: HGBA1C  Lab Results  Component Value Date   CREATININE 1.31 (H) 03/28/2016   CREATININE 1.05 (H) 11/11/2015   CREATININE 1.19 11/04/2015    Lab Results  Component Value Date   WBC 4.3 11/11/2015   HGB 12.4 11/11/2015   HCT 36.2 11/11/2015   PLT 188 11/11/2015   GLUCOSE 105 (H) 03/28/2016   CHOL 153 03/28/2016   TRIG 102.0 03/28/2016   HDL 60.20 03/28/2016   LDLDIRECT 94.0 11/25/2014   LDLCALC 72 03/28/2016   ALT 10 03/28/2016   AST 15 03/28/2016   NA 138 03/28/2016   K 4.6 03/28/2016   CL 106 03/28/2016   CREATININE 1.31 (H) 03/28/2016   BUN 25 (H) 03/28/2016   CO2 26 03/28/2016   TSH 0.44 11/04/2015   INR 1.00 05/03/2015    Dg Chest 2 View  Result Date: 11/11/2015 CLINICAL DATA:  Cough, dizziness and weakness, COPD EXAM: CHEST  2 VIEW COMPARISON:  05/03/2015 FINDINGS: Hyperinflation noted without focal pneumonia, collapse or consolidation. No edema, effusion or pneumothorax. Normal heart size and vascularity. Aorta is atherosclerotic and tortuous. Small hiatal hernia noted. Apical scarring also present. Degenerative thoracic spondylosis diffusely. Compression fracture noted at approximately T12 on the lateral view, age indeterminate. There is scoliosis of the spine as well. IMPRESSION: Chronic COPD/emphysema without superimposed acute chest process. Thoracic aortic atherosclerosis Small hiatal hernia Electronically Signed   By: Jerilynn Mages.  Shick M.D.   On: 11/11/2015 15:07     Ct Head Wo Contrast  Result Date: 11/11/2015 CLINICAL DATA:  Headache. EXAM: CT HEAD WITHOUT CONTRAST TECHNIQUE: Contiguous axial images were obtained from the base of the skull through the vertex without intravenous contrast. COMPARISON:  09/17/2013 MRI.  06/25/2011 CT. FINDINGS: Brain: Moderate low density in the periventricular white matter likely related to small vessel disease. Dilated perivascular space versus remote lacunar infarct in the right thalamus. No mass lesion, hemorrhage, hydrocephalus, acute infarct, intra-axial, or extra-axial fluid collection. Vascular: No hyperdense vessel or unexpected calcification. Skull: Negative for fracture or focal lesion. Sinuses/Orbits: Clear paranasal sinuses and mastoid air cells. Cerumen in the left external ear canal. Normal orbits and globes. Other: None. IMPRESSION: 1.  No acute intracranial abnormality. 2. Small vessel ischemic change. 3. No explanation for headache. Electronically Signed   By: Abigail Miyamoto M.D.   On: 11/11/2015 15:01    Assessment & Plan:   Problem List Items Addressed This Visit  Chronic pain syndrome    Secondary to DJD left hip and lumbago With osteoporotic fractures. Her pain is managed with meloxicam , twice daily vicodin, and up to 1000 mg tylenol  Refills for 3 months of Vicodin was given after refill history confirmed via [place] Controlled Substance databas, accessed by me today.Marland Kitchen      Unstable angina pectoris due to coronary arteriosclerosis ([place])    New onset chest pain at rest in patient with known CAD and new EKG chnages suggesting ischemia .  Patient  Is advised to go to ER;  She is accompanied by 2 daughters who have agreed to take her immediately.   ER Triage RN notified.        Other Visit Diagnoses    Heart palpitations    -  Primary   Relevant Orders   EKG 12-Lead (Completed)      I have discontinued Ms. Mumford's meloxicam. I am also having her maintain her lisinopril, atorvastatin, albuterol,  mirtazapine, PARoxetine, diazepam, megestrol, budesonide-formoterol, lisinopril, omeprazole, HYDROcodone-acetaminophen, HYDROcodone-acetaminophen, and HYDROcodone-acetaminophen.  Meds ordered this encounter  Medications  . DISCONTD: HYDROcodone-acetaminophen (NORCO/VICODIN) 5-325 MG tablet    Sig: Take 1 tablet by mouth every 6 (six) hours as needed for moderate pain. Maximum 2 daily.    Dispense:  62 tablet    Refill:  0    For refill on or after Sep 07 2016  . HYDROcodone-acetaminophen (NORCO/VICODIN) 5-325 MG tablet    Sig: Take 1 tablet by mouth every 6 (six) hours as needed for moderate pain. Maximum 2 daily.    Dispense:  62 tablet    Refill:  0    For refill on or after August 08 2016  . DISCONTD: HYDROcodone-acetaminophen (NORCO/VICODIN) 5-325 MG tablet    Sig: Take 1 tablet by mouth every 6 (six) hours as needed for moderate pain. Maximum 2 daily.    Dispense:  56 tablet    Refill:  0    For refill on or after  October 08 2016  . HYDROcodone-acetaminophen (NORCO/VICODIN) 5-325 MG tablet    Sig: Take 1 tablet by mouth every 6 (six) hours as needed for moderate pain. Maximum 2 daily.    Dispense:  62 tablet    Refill:  0    For refill on or after October 08 2016  . DISCONTD: HYDROcodone-acetaminophen (NORCO/VICODIN) 5-325 MG tablet    Sig: Take 1 tablet by mouth every 6 (six) hours as needed for moderate pain. Maximum 2 daily.    Dispense:  56 tablet    Refill:  0    For refill on or after  November 07 2016  . HYDROcodone-acetaminophen (NORCO/VICODIN) 5-325 MG tablet    Sig: Take 1 tablet by mouth every 6 (six) hours as needed for moderate pain. Maximum 2 daily.    Dispense:  56 tablet    Refill:  0    For refill on or after  December 08 2016    Medications Discontinued During This Encounter  Medication Reason  . meloxicam (MOBIC) 15 MG tablet Patient has not taken in last 30 days  . HYDROcodone-acetaminophen (NORCO/VICODIN) 5-325 MG tablet Reorder  . HYDROcodone-acetaminophen  (NORCO/VICODIN) 5-325 MG tablet Reorder  . HYDROcodone-acetaminophen (NORCO/VICODIN) 5-325 MG tablet Reorder  . HYDROcodone-acetaminophen (NORCO/VICODIN) 5-325 MG tablet Reorder  . HYDROcodone-acetaminophen (NORCO/VICODIN) 5-325 MG tablet Reorder  . HYDROcodone-acetaminophen (NORCO/VICODIN) 5-325 MG tablet Reorder    Follow-up: No Follow-up on file.   Crecencio Mc, MD

## 2016-08-24 NOTE — ED Notes (Signed)
EDP at bedside  

## 2016-08-25 ENCOUNTER — Inpatient Hospital Stay
Admit: 2016-08-25 | Discharge: 2016-08-25 | Disposition: A | Discharge status: Home / Self Care | Payer: Medicare Other | Attending: Cardiology | Admitting: Cardiology

## 2016-08-25 LAB — LIPID PANEL
Cholesterol: 134 mg/dL (ref 0–200)
HDL: 47 mg/dL (ref >40)
LDL CALC: 74 mg/dL (ref 0–99)
TRIGLYCERIDES: 65 mg/dL (ref <150)
Total CHOL/HDL Ratio: 2.9 RATIO
VLDL: 13 mg/dL (ref 0–40)

## 2016-08-25 LAB — BASIC METABOLIC PANEL
ANION GAP: 4 — AB (ref 5–15)
BUN: 25 mg/dL — ABNORMAL HIGH (ref 6–20)
CO2: 23 mmol/L (ref 22–32)
Calcium: 8.5 mg/dL — ABNORMAL LOW (ref 8.9–10.3)
Chloride: 111 mmol/L (ref 101–111)
Creatinine, Ser: 1.36 mg/dL — ABNORMAL HIGH (ref 0.44–1.00)
GFR, EST AFRICAN AMERICAN: 41 mL/min — AB (ref >60)
GFR, EST NON AFRICAN AMERICAN: 35 mL/min — AB (ref >60)
Glucose, Bld: 94 mg/dL (ref 65–99)
POTASSIUM: 4.3 mmol/L (ref 3.5–5.1)
SODIUM: 138 mmol/L (ref 135–145)

## 2016-08-25 LAB — ECHOCARDIOGRAM COMPLETE
Height: 65 in
Weight: 1952 [oz_av]

## 2016-08-25 LAB — PROTIME-INR
INR: 1.04
PROTHROMBIN TIME: 13.6 s (ref 11.4–15.2)

## 2016-08-25 LAB — APTT: APTT: 26 s (ref 24–36)

## 2016-08-25 LAB — HEPARIN LEVEL (UNFRACTIONATED): HEPARIN UNFRACTIONATED: 0.12 [IU]/mL — AB (ref 0.30–0.70)

## 2016-08-25 LAB — HEMOGLOBIN: Hemoglobin: 8.6 g/dL — ABNORMAL LOW (ref 12.0–16.0)

## 2016-08-25 LAB — TROPONIN I: Troponin I: 0.87 ng/mL (ref <0.03)

## 2016-08-25 MED ORDER — HEPARIN BOLUS VIA INFUSION
1650.0000 [IU] | Freq: Once | INTRAVENOUS | Status: AC
  Administered 2016-08-25: 1650 [IU] via INTRAVENOUS
  Filled 2016-08-25: qty 1650

## 2016-08-25 MED ORDER — HEPARIN BOLUS VIA INFUSION
3000.0000 [IU] | Freq: Once | INTRAVENOUS | Status: AC
  Administered 2016-08-25: 3000 [IU] via INTRAVENOUS
  Filled 2016-08-25: qty 3000

## 2016-08-25 MED ORDER — ASPIRIN 81 MG PO CHEW
81.0000 mg | CHEWABLE_TABLET | ORAL | Status: AC
  Administered 2016-08-27: 81 mg via ORAL
  Filled 2016-08-25: qty 1

## 2016-08-25 MED ORDER — HEPARIN (PORCINE) IN NACL 100-0.45 UNIT/ML-% IJ SOLN
950.0000 [IU]/h | INTRAMUSCULAR | Status: DC
  Administered 2016-08-25: 650 [IU]/h via INTRAVENOUS
  Administered 2016-08-26: 950 [IU]/h via INTRAVENOUS
  Filled 2016-08-25 (×2): qty 250

## 2016-08-25 MED ORDER — SODIUM CHLORIDE 0.9 % WEIGHT BASED INFUSION: 3.0000 mL/kg/h | INTRAVENOUS | Status: AC

## 2016-08-25 MED ORDER — SODIUM CHLORIDE 0.9% FLUSH
3.0000 mL | Freq: Two times a day (BID) | INTRAVENOUS | Status: DC
  Administered 2016-08-25: 3 mL via INTRAVENOUS

## 2016-08-25 MED ORDER — ALUM & MAG HYDROXIDE-SIMETH 200-200-20 MG/5ML PO SUSP
15.0000 mL | Freq: Once | ORAL | Status: AC
  Administered 2016-08-25: 15 mL via ORAL
  Filled 2016-08-25: qty 30

## 2016-08-25 MED ORDER — SODIUM CHLORIDE 0.9 % IV SOLN: 250.0000 mL | INTRAVENOUS | Status: DC | PRN

## 2016-08-25 MED ORDER — SODIUM CHLORIDE 0.9% FLUSH: 3.0000 mL | INTRAVENOUS | Status: DC | PRN

## 2016-08-25 MED ORDER — SODIUM CHLORIDE 0.9 % WEIGHT BASED INFUSION
1.0000 mL/kg/h | INTRAVENOUS | Status: DC
  Administered 2016-08-27: 1 mL/kg/h via INTRAVENOUS

## 2016-08-25 NOTE — Consult Note (Signed)
[facility]  CARDIOLOGY CONSULT NOTE  Patient ID: Anna Prince MRN: [id number] DOB/AGE: 08/30/1931 83 y.o.  Admit date: 08/24/2016 Referring Physician Dr. Benjie Karvonen Primary Physician   Primary Cardiologist   Reason for Consultation nstemi  HPI: Patient is an 83 year old female with history of an aortic aneurysm which is been treated, history of Barrett's esophagus, history of COPD was admitted after several days of intermittent chest pain. Her electrocardiogram on presentation showed sinus rhythm with no ischemia. Chest x-ray showed no acute cardiopulmonary disease. She is currently pain-free. She is currently stable Lovenox and aspirin. Her peak troponin was 1.15 which was present on presentation and has subsequently declined suggesting possible non-ST elevated myocardial infarction prior to admission.  Review of Systems  Constitutional: Negative.   HENT: Negative.   Eyes: Negative.   Respiratory: Positive for shortness of breath.   Cardiovascular: Positive for chest pain.  Gastrointestinal: Negative.   Genitourinary: Negative.   Musculoskeletal: Negative.   Skin: Negative.   Neurological: Negative.   Endo/Heme/Allergies: Negative.   Psychiatric/Behavioral: Negative.     Past Medical History:  Diagnosis Date  . Anemia 2012   of acute blood loss, resolved  . Anxiety   . Aortic aneurysm ([place])   . Arthritis   . Barrett's esophagus 2007  . Bursitis 2012   left hip, improved with periodic steroid injection [facility] [place])  . COPD (chronic obstructive pulmonary disease) ([place])   . Depression   . GERD (gastroesophageal reflux disease)    with Barretts Esophagus  . Hyperlipidemia   . Hypertension   . Lumbago   . Macular degeneration   . Osteoporosis   . Sigmoid diverticulosis    by colonoscopy    Family History  Problem Relation Age of Onset  . Coronary artery disease Mother   . Coronary artery disease Father 25  .  Cancer Neg Hx     no breast,coon or ovarian ca    Social History   Social History  . Marital status: Widowed    Spouse name: N/A  . Number of children: N/A  . Years of education: N/A   Occupational History  . Not on file.   Social History Main Topics  . Smoking status: Current Every Day Smoker    Packs/day: 0.50    Types: Cigarettes  . Smokeless tobacco: Never Used     Comment: HAS BEEN SMOKING 30+ YEARS,RESUMED TOBACCO USE AFTER AAA REPAIR  . Alcohol use No  . Drug use: No  . Sexual activity: Not on file   Other Topics Concern  . Not on file   Social History Narrative  . No narrative on file    Past Surgical History:  Procedure Laterality Date  . ABDOMINAL AORTIC ANEURYSM REPAIR  July 2010   [facility]  . APPENDECTOMY    . BACK SURGERY     X2..1975 arachnoid cyst cervical region([place]);1997 lumbosacral tumor,9 hr surgery     Prescriptions Prior to Admission  Medication Sig Dispense Refill Last Dose  . albuterol (PROVENTIL HFA;VENTOLIN HFA) 108 (90 Base) MCG/ACT inhaler Inhale 2 puffs into the lungs every 6 (six) hours as needed for wheezing or shortness of breath. 1 Inhaler 2 prn at prn  . budesonide-formoterol (SYMBICORT) 80-4.5 MCG/ACT inhaler Inhale 2 puffs into the lungs 2 (two) times daily. 1 Inhaler 12 08/24/2016 at am  . diazepam (VALIUM) 5 MG tablet Take 0.5 tablets (2.5 mg total) by mouth 2 (two) times daily as  needed. 30 tablet 5 prn at prn  . HYDROcodone-acetaminophen (NORCO/VICODIN) 5-325 MG tablet Take 1 tablet by mouth every 6 (six) hours as needed for moderate pain. Maximum 2 daily. 62 tablet 0 08/24/2016 at am  . lisinopril (PRINIVIL,ZESTRIL) 20 MG tablet TAKE 1 TABLET BY MOUTH EVERY DAY 90 tablet 2 08/24/2016 at am  . lovastatin (MEVACOR) 40 MG tablet Take 40 mg by mouth daily.   08/24/2016 at am  . megestrol (MEGACE) 40 MG tablet Take 1 tablet (40 mg total) by mouth daily. 30 tablet 5 08/24/2016 at am  . mirtazapine (REMERON) 45 MG tablet Take 1 tablet (45  mg total) by mouth at bedtime. 90 tablet 5 08/23/2016 at qhs  . omeprazole (PRILOSEC) 40 MG capsule TAKE 1 CAPSULE BY MOUTH DAILY. 90 capsule 0 08/24/2016 at am  . atorvastatin (LIPITOR) 40 MG tablet Take 1 tablet (40 mg total) by mouth daily. (Patient not taking: Reported on 08/24/2016) 90 tablet 3 Not Taking at Unknown time  . HYDROcodone-acetaminophen (NORCO/VICODIN) 5-325 MG tablet Take 1 tablet by mouth every 6 (six) hours as needed for moderate pain. Maximum 2 daily. (Patient not taking: Reported on 08/24/2016) 62 tablet 0 Not Taking at Unknown time  . HYDROcodone-acetaminophen (NORCO/VICODIN) 5-325 MG tablet Take 1 tablet by mouth every 6 (six) hours as needed for moderate pain. Maximum 2 daily. (Patient not taking: Reported on 08/24/2016) 56 tablet 0 Not Taking at Unknown time  . PARoxetine (PAXIL) 30 MG tablet Take 1 tablet (30 mg total) by mouth daily. (Patient not taking: Reported on 08/24/2016) 90 tablet 1 Not Taking at Unknown time    Physical Exam: Blood pressure 124/63, pulse 68, temperature 99.1 F (37.3 C), temperature source Oral, resp. rate 18, height 5\' 5"  (1.651 m), weight 55.3 kg (122 lb), SpO2 98 %.   Wt Readings from Last 1 Encounters:  08/24/16 55.3 kg (122 lb)     General appearance: alert and cooperative Resp: clear to auscultation bilaterally Chest wall: no tenderness Cardio: regular rate and rhythm GI: soft, non-tender; bowel sounds normal; no masses,  no organomegaly Extremities: extremities normal, atraumatic, no cyanosis or edema Neurologic: Grossly normal  Labs:   Lab Results  Component Value Date   WBC 5.7 08/24/2016   HGB 8.6 (L) 08/25/2016   HCT 29.8 (L) 08/24/2016   MCV 89.0 08/24/2016   PLT 189 08/24/2016    Recent Labs Lab 08/25/16 0321  NA 138  K 4.3  CL 111  CO2 23  BUN 25*  CREATININE 1.36*  CALCIUM 8.5*  GLUCOSE 94   Lab Results  Component Value Date   TROPONINI 0.87 ([place]) 08/25/2016      Radiology: No acute cardiopulmonary  disease EKG: Sinus rhythm with nonspecific ST-T wave changes.  ASSESSMENT AND PLAN:  Patient is an 83 year old female with no prior cardiac history other than aortic aneurysm repair who presented with episodes of chest pain occurring throughout the week. On presentation to the emergency room her echocardiogram is unremarkable. Her serum troponin was mildly elevated at 1.15. She has mild shortness of breath. Symptoms improved with heparin and nitrates and aspirin. She is currently pain-free on aspirin. Will add IV heparin and discontinue Lovenox. She is on high intensity statin and beta blocker. Echocardiogram is pending. We'll proceed with cardiac catheter on Monday. Signed: Teodoro Spray MD, [facility] 08/25/2016, 9:46 AM

## 2016-08-25 NOTE — Plan of Care (Signed)
Problem: Safety: Goal: Ability to remain free from injury will improve Outcome: Progressing Pt encouraged to ask for assistance with activity. Exit alarm is activated.

## 2016-08-25 NOTE — Progress Notes (Signed)
[place] [facility] [place] NAME: Anna Prince    MR#:  [id number]  DATE OF BIRTH:  09/10/1931  SUBJECTIVE:   Patient without CP overnight  REVIEW OF SYSTEMS:    Review of Systems  Constitutional: Negative for fever, chills weight loss HENT: Negative for ear pain, nosebleeds, congestion, facial swelling, rhinorrhea, neck pain, neck stiffness and ear discharge.   Respiratory: Negative for cough, shortness of breath, wheezing  Cardiovascular: Negative for chest pain, palpitations and leg swelling.  Gastrointestinal: Negative for heartburn, abdominal pain, vomiting, diarrhea or consitpation Genitourinary: Negative for dysuria, urgency, frequency, hematuria Musculoskeletal: Negative for back pain or joint pain Neurological: Negative for dizziness, seizures, syncope, focal weakness,  numbness and headaches.  Hematological: Does not bruise/bleed easily.  Psychiatric/Behavioral: Negative for hallucinations, confusion, dysphoric mood    Tolerating Diet: yes      DRUG ALLERGIES:   Allergies  Allergen Reactions  . Morphine Sulfate Other (See Comments)    Reaction:  Hallucinations     VITALS:  Blood pressure 124/63, pulse 68, temperature 99.1 F (37.3 C), temperature source Oral, resp. rate 18, height 5\' 5"  (1.651 m), weight 55.3 kg (122 lb), SpO2 98 %.  PHYSICAL EXAMINATION:  Constitutional: Appears well-developed and well-nourished. No distress. HENT: Normocephalic. Marland Kitchen Oropharynx is clear and moist.  Eyes: Conjunctivae and EOM are normal. PERRLA, no scleral icterus.  Neck: Normal ROM. Neck supple. No JVD. No tracheal deviation. CVS: RRR, S1/S2 +, no murmurs, no gallops, no carotid bruit.  Pulmonary: Effort and breath sounds normal, no stridor, rhonchi, wheezes, rales.  Abdominal: Soft. BS +,  no distension, tenderness, rebound or guarding.  Musculoskeletal: Normal range of motion. No edema and no tenderness.  Neuro: Alert. CN 2-12  grossly intact. No focal deficits. Skin: Skin is warm and dry. No rash noted. Psychiatric: Normal mood and affect.      LABORATORY PANEL:   CBC  Recent Labs Lab 08/24/16 1453 08/25/16 0321  WBC 5.7  --   HGB 9.6* 8.6*  HCT 29.8*  --   PLT 189  --    ------------------------------------------------------------------------------------------------------------------  Chemistries   Recent Labs Lab 08/25/16 0321  NA 138  K 4.3  CL 111  CO2 23  GLUCOSE 94  BUN 25*  CREATININE 1.36*  CALCIUM 8.5*   ------------------------------------------------------------------------------------------------------------------  Cardiac Enzymes  Recent Labs Lab 08/24/16 1453 08/24/16 2055 08/25/16 0321  TROPONINI 1.15* 0.91* 0.87*   ------------------------------------------------------------------------------------------------------------------  RADIOLOGY:  Dg Chest 2 View  Result Date: 08/24/2016 CLINICAL DATA:  Abnormal electrocardiogram.  Chest pain EXAM: CHEST  2 VIEW COMPARISON:  November 11, 2015 FINDINGS: There is no edema or consolidation. Heart size and pulmonary vascularity are within normal limits. There is aortic atherosclerosis. There is left anterior descending coronary artery calcification. No adenopathy. There is thoracolumbar levoscoliosis. IMPRESSION: Aortic atherosclerosis. Left anterior descending coronary artery calcification. Scoliosis. No edema or consolidation. Electronically Signed   By: Lowella Grip III M.D.   On: 08/24/2016 15:22     ASSESSMENT AND PLAN:    83 year old female with COPD and Barrett's esophagus who presents with non-ST elevation MI   1. Non-ST elevation MI with troponins now trending down Troponin max 1.15 Patient symptoms have improved with heparin and nitrates. Continue IV heparin, aspirin, Lipitor, metoprolol, statin Cardiac catheterization planned for Monday Appreciate cartilage consult Follow up on echocardiogram   2.  Essential hypertension: Continue lisinopril and metoprolol  3. Depression: Continue Remeron  4. Barrett's esophagus: Continue PPI   Management  plans discussed with the patient and she is in agreement.  CODE STATUS: full  TOTAL TIME TAKING CARE OF THIS PATIENT: 30 minutes.    D/w dr Ubaldo Glassing POSSIBLE D/C tuesday, DEPENDING ON CLINICAL CONDITION.   Nanci Lakatos M.D on 08/25/2016 at 10:25 AM  Between 7am to 6pm - Pager - [phone] After 6pm go to [email or web address] - password [email or web address] [facility]  Office  [phone]  CC: Primary care physician; Crecencio Mc, MD  Note: This dictation was prepared with Dragon dictation along with smaller phrase technology. Any transcriptional errors that result from this process are unintentional.

## 2016-08-25 NOTE — Progress Notes (Addendum)
ANTICOAGULATION CONSULT NOTE - Initial Consult  Pharmacy Consult for heparin bolus and drip Indication: chest pain/ACS  Allergies  Allergen Reactions  . Morphine Sulfate Other (See Comments)    Reaction:  Hallucinations     Patient Measurements: Height: 5\' 5"  (165.1 cm) Weight: 122 lb (55.3 kg) IBW/kg (Calculated) : 57 Heparin Dosing Weight: 55.3 kg  Vital Signs: Temp: 98.4 F (36.9 C) (05/05 1225) Temp Source: Oral (05/05 1225) BP: 155/59 (05/05 1932) Pulse Rate: 61 (05/05 1932)  Labs:  Recent Labs  08/24/16 1453 08/24/16 2055 08/25/16 0321 08/25/16 1009 08/25/16 1909  HGB 9.6*  --  8.6*  --   --   HCT 29.8*  --   --   --   --   PLT 189  --   --   --   --   APTT  --   --   --  26  --   LABPROT  --   --   --  13.6  --   INR  --   --   --  1.04  --   HEPARINUNFRC  --   --   --   --  0.12*  CREATININE 1.26*  --  1.36*  --   --   TROPONINI 1.15* 0.91* 0.87*  --   --     Estimated Creatinine Clearance: 27.8 mL/min (A) (by C-G formula based on SCr of 1.36 mg/dL (H)).   Medical History: Past Medical History:  Diagnosis Date  . Anemia 2012   of acute blood loss, resolved  . Anxiety   . Aortic aneurysm ([place])   . Arthritis   . Barrett's esophagus 2007  . Bursitis 2012   left hip, improved with periodic steroid injection [facility] [place])  . COPD (chronic obstructive pulmonary disease) ([place])   . Depression   . GERD (gastroesophageal reflux disease)    with Barretts Esophagus  . Hyperlipidemia   . Hypertension   . Lumbago   . Macular degeneration   . Osteoporosis   . Sigmoid diverticulosis    by colonoscopy    Medications:  Scheduled:  . aspirin  324 mg Oral Once  . [START ON 08/26/2016] aspirin  81 mg Oral Pre-Cath  . aspirin EC  81 mg Oral Daily  . atorvastatin  40 mg Oral q1800  . heparin  1,650 Units Intravenous Once  . lisinopril  20 mg Oral Daily  . metoprolol tartrate  12.5 mg Oral BID  . mirtazapine  45 mg Oral QHS  . mometasone-formoterol  2  puff Inhalation BID  . pantoprazole  40 mg Oral Daily  . sodium chloride flush  3 mL Intravenous Q12H  . sodium chloride flush  3 mL Intravenous Q12H   Infusions:  . sodium chloride    . sodium chloride    . [START ON 08/26/2016] sodium chloride     Followed by  . [START ON 08/26/2016] sodium chloride    . heparin 650 Units/hr (08/25/16 1145)    Assessment: Pharmacy consulted to dose heparin bolus and drip in this 35 yoF with ACS/chest pain. Patient received enoxaparin 40 mg 5/4 @ 2200. Per PTA med list, no anticoag. Plan is for cardiac cath on Monday. Baseline labs ordered.  Goal of Therapy:  Heparin level 0.3-0.7 units/ml Monitor platelets by anticoagulation protocol: Yes   Plan:  Give 3000 units bolus x 1 Start heparin infusion at 650 units/hr Check anti-Xa level in 8 hours and daily while on heparin Continue  to monitor H&H and platelets   5/5:  HL @ 1909 = 0.12 Will order Heparin 1650 units IV X 1 bolus and increase drip rate to 850 units/hr.  Will recheck HL 8 hrs after rate change.  5/6 AM heparin level 0.21. 800 unit bolus and increase rate to 950 units/hr. Recheck in 8 hours.   [place] Resident 08/25/2016 7:47 PM

## 2016-08-25 NOTE — Progress Notes (Signed)
Pt c/o gas, Maalox was ordered per standing order. Will continue to monitor.

## 2016-08-25 NOTE — Progress Notes (Signed)
ANTICOAGULATION CONSULT NOTE - Initial Consult  Pharmacy Consult for heparin bolus and drip Indication: chest pain/ACS  Allergies  Allergen Reactions  . Morphine Sulfate Other (See Comments)    Reaction:  Hallucinations     Patient Measurements: Height: 5\' 5"  (165.1 cm) Weight: 122 lb (55.3 kg) IBW/kg (Calculated) : 57 Heparin Dosing Weight: 55.3 kg  Vital Signs: Temp: 99.1 F (37.3 C) (05/05 0434) Temp Source: Oral (05/05 0434) BP: 124/63 (05/05 0434) Pulse Rate: 68 (05/05 0434)  Labs:  Recent Labs  08/24/16 1453 08/24/16 2055 08/25/16 0321  HGB 9.6*  --  8.6*  HCT 29.8*  --   --   PLT 189  --   --   CREATININE 1.26*  --  1.36*  TROPONINI 1.15* 0.91* 0.87*    Estimated Creatinine Clearance: 27.8 mL/min (A) (by C-G formula based on SCr of 1.36 mg/dL (H)).   Medical History: Past Medical History:  Diagnosis Date  . Anemia 2012   of acute blood loss, resolved  . Anxiety   . Aortic aneurysm ([place])   . Arthritis   . Barrett's esophagus 2007  . Bursitis 2012   left hip, improved with periodic steroid injection [facility] , [place])  . COPD (chronic obstructive pulmonary disease) ([place])   . Depression   . GERD (gastroesophageal reflux disease)    with Barretts Esophagus  . Hyperlipidemia   . Hypertension   . Lumbago   . Macular degeneration   . Osteoporosis   . Sigmoid diverticulosis    by colonoscopy    Medications:  Scheduled:  . aspirin  324 mg Oral Once  . aspirin EC  81 mg Oral Daily  . atorvastatin  40 mg Oral q1800  . heparin  3,000 Units Intravenous Once  . lisinopril  20 mg Oral Daily  . metoprolol tartrate  12.5 mg Oral BID  . mirtazapine  45 mg Oral QHS  . mometasone-formoterol  2 puff Inhalation BID  . pantoprazole  40 mg Oral Daily  . sodium chloride flush  3 mL Intravenous Q12H   Infusions:  . sodium chloride    . sodium chloride 75 mL/hr at 08/24/16 1900  . heparin      Assessment: Pharmacy consulted to dose heparin bolus and drip  in this 89 yoF with ACS/chest pain. Patient received enoxaparin 40 mg 5/4 @ 2200. Per PTA med list, no anticoag. Plan is for cardiac cath on Monday. Baseline labs ordered.  Goal of Therapy:  Heparin level 0.3-0.7 units/ml Monitor platelets by anticoagulation protocol: Yes   Plan:  Give 3000 units bolus x 1 Start heparin infusion at 650 units/hr Check anti-Xa level in 8 hours and daily while on heparin Continue to monitor H&H and platelets  Darrow Bussing, PharmD Pharmacy Resident 08/25/2016 10:18 AM

## 2016-08-25 NOTE — Progress Notes (Signed)
*  PRELIMINARY RESULTS* Echocardiogram 2D Echocardiogram has been performed.  Anna Prince Anna Prince 08/25/2016, 2:13 PM

## 2016-08-26 LAB — BASIC METABOLIC PANEL
Anion gap: 4 — ABNORMAL LOW (ref 5–15)
BUN: 32 mg/dL — AB (ref 6–20)
CALCIUM: 8.7 mg/dL — AB (ref 8.9–10.3)
CO2: 22 mmol/L (ref 22–32)
CREATININE: 1.23 mg/dL — AB (ref 0.44–1.00)
Chloride: 110 mmol/L (ref 101–111)
GFR calc Af Amer: 46 mL/min — ABNORMAL LOW (ref >60)
GFR, EST NON AFRICAN AMERICAN: 40 mL/min — AB (ref >60)
GLUCOSE: 95 mg/dL (ref 65–99)
POTASSIUM: 3.9 mmol/L (ref 3.5–5.1)
SODIUM: 136 mmol/L (ref 135–145)

## 2016-08-26 LAB — CBC
HCT: 26.7 % — ABNORMAL LOW (ref 35.0–47.0)
Hemoglobin: 8.9 g/dL — ABNORMAL LOW (ref 12.0–16.0)
MCH: 29.7 pg (ref 26.0–34.0)
MCHC: 33.4 g/dL (ref 32.0–36.0)
MCV: 88.7 fL (ref 80.0–100.0)
PLATELETS: 159 10*3/uL (ref 150–440)
RBC: 3.01 MIL/uL — AB (ref 3.80–5.20)
RDW: 13.8 % (ref 11.5–14.5)
WBC: 4.3 10*3/uL (ref 3.6–11.0)

## 2016-08-26 LAB — HEPARIN LEVEL (UNFRACTIONATED)
HEPARIN UNFRACTIONATED: 0.21 [IU]/mL — AB (ref 0.30–0.70)
HEPARIN UNFRACTIONATED: 0.38 [IU]/mL (ref 0.30–0.70)
Heparin Unfractionated: 0.37 IU/mL (ref 0.30–0.70)

## 2016-08-26 MED ORDER — HEPARIN BOLUS VIA INFUSION
800.0000 [IU] | Freq: Once | INTRAVENOUS | Status: DC
  Filled 2016-08-26: qty 800

## 2016-08-26 NOTE — Progress Notes (Signed)
ANTICOAGULATION CONSULT NOTE - Initial Consult  Pharmacy Consult for heparin bolus and drip Indication: chest pain/ACS  Allergies  Allergen Reactions  . Morphine Sulfate Other (See Comments)    Reaction:  Hallucinations     Patient Measurements: Height: 5\' 5"  (165.1 cm) Weight: 122 lb (55.3 kg) IBW/kg (Calculated) : 57 Heparin Dosing Weight: 55.3 kg  Vital Signs: Temp: 98.6 F (37 C) (05/06 0330) Temp Source: Oral (05/06 0330) BP: 136/55 (05/06 1353) Pulse Rate: 55 (05/06 1353)  Labs:  Recent Labs  08/24/16 1453 08/24/16 2055 08/25/16 0321 08/25/16 1009 08/25/16 1909 08/26/16 0423 08/26/16 1401  HGB 9.6*  --  8.6*  --   --   --  8.9*  HCT 29.8*  --   --   --   --   --  26.7*  PLT 189  --   --   --   --   --  159  APTT  --   --   --  26  --   --   --   LABPROT  --   --   --  13.6  --   --   --   INR  --   --   --  1.04  --   --   --   HEPARINUNFRC  --   --   --   --  0.12* 0.21* 0.37  CREATININE 1.26*  --  1.36*  --   --  1.23*  --   TROPONINI 1.15* 0.91* 0.87*  --   --   --   --     Estimated Creatinine Clearance: 30.8 mL/min (A) (by C-G formula based on SCr of 1.23 mg/dL (H)).   Medical History: Past Medical History:  Diagnosis Date  . Anemia 2012   of acute blood loss, resolved  . Anxiety   . Aortic aneurysm ([place])   . Arthritis   . Barrett's esophagus 2007  . Bursitis 2012   left hip, improved with periodic steroid injection [facility] [place])  . COPD (chronic obstructive pulmonary disease) ([place])   . Depression   . GERD (gastroesophageal reflux disease)    with Barretts Esophagus  . Hyperlipidemia   . Hypertension   . Lumbago   . Macular degeneration   . Osteoporosis   . Sigmoid diverticulosis    by colonoscopy    Medications:  Scheduled:  . aspirin  324 mg Oral Once  . aspirin  81 mg Oral Pre-Cath  . aspirin EC  81 mg Oral Daily  . atorvastatin  40 mg Oral q1800  . heparin  800 Units Intravenous Once  . lisinopril  20 mg Oral Daily  .  metoprolol tartrate  12.5 mg Oral BID  . mirtazapine  45 mg Oral QHS  . mometasone-formoterol  2 puff Inhalation BID  . pantoprazole  40 mg Oral Daily  . sodium chloride flush  3 mL Intravenous Q12H  . sodium chloride flush  3 mL Intravenous Q12H   Infusions:  . sodium chloride    . sodium chloride    . sodium chloride    . heparin 950 Units/hr (08/26/16 1429)    Assessment: Pharmacy consulted to dose heparin bolus and drip in this 12 yoF with ACS/chest pain. Patient received enoxaparin 40 mg 5/4 @ 2200. Per PTA med list, no anticoag. Plan is for cardiac cath on Monday. Baseline labs ordered.  Goal of Therapy:  Heparin level 0.3-0.7 units/ml Monitor platelets by anticoagulation protocol: Yes  Plan:  Give 3000 units bolus x 1 Start heparin infusion at 650 units/hr Check anti-Xa level in 8 hours and daily while on heparin Continue to monitor H&H and platelets   5/5:  HL @ 1909 = 0.12 Will order Heparin 1650 units IV X 1 bolus and increase drip rate to 850 units/hr.  Will recheck HL 8 hrs after rate change.  5/6 AM heparin level 0.21. 800 unit bolus and increase rate to 950 units/hr. Recheck in 8 hours.   5/6 Heparin level resulted @ 0.37. Will continue current rate of heparin. Will recheck Heparin level @ 22:00.   Larene Beach, PharmD   08/26/2016 2:44 PM

## 2016-08-26 NOTE — Progress Notes (Signed)
[place] [facility] [place] NAME: Anna Prince    MR#:  [id number]  DATE OF BIRTH:  Sep 10, 1931  SUBJECTIVE:   Patient Doing well this morning. No acute events reported overnight. She had some twinges of chest discomfort this morning that lasted justseconds.  REVIEW OF SYSTEMS:    Review of Systems  Constitutional: Negative for fever, chills weight loss HENT: Negative for ear pain, nosebleeds, congestion, facial swelling, rhinorrhea, neck pain, neck stiffness and ear discharge.   Respiratory: Negative for cough, shortness of breath, wheezing  Cardiovascular: Twinges of chest pain no palpitations and leg swelling.  Gastrointestinal: Negative for heartburn, abdominal pain, vomiting, diarrhea or consitpation Genitourinary: Negative for dysuria, urgency, frequency, hematuria Musculoskeletal: Negative for back pain or joint pain Neurological: Negative for dizziness, seizures, syncope, focal weakness,  numbness and headaches.  Hematological: Does not bruise/bleed easily.  Psychiatric/Behavioral: Negative for hallucinations, confusion, dysphoric mood    Tolerating Diet: yes      DRUG ALLERGIES:   Allergies  Allergen Reactions  . Morphine Sulfate Other (See Comments)    Reaction:  Hallucinations     VITALS:  Blood pressure 122/79, pulse (!) 58, temperature 98.6 F (37 C), temperature source Oral, resp. rate 18, height 5\' 5"  (1.651 m), weight 55.3 kg (122 lb), SpO2 98 %.  PHYSICAL EXAMINATION:  Constitutional: Appears well-developed and well-nourished. No distress. HENT: Normocephalic. Marland Kitchen Oropharynx is clear and moist.  Eyes: Conjunctivae and EOM are normal. PERRLA, no scleral icterus.  Neck: Normal ROM. Neck supple. No JVD. No tracheal deviation. CVS: RRR, S1/S2 +, no murmurs, no gallops, no carotid bruit.  Pulmonary: Effort and breath sounds normal, no stridor, rhonchi, wheezes, rales.  Abdominal: Soft. BS +,  no distension,  tenderness, rebound or guarding.  Musculoskeletal: Normal range of motion. No edema and no tenderness.  Neuro: Alert. CN 2-12 grossly intact. No focal deficits. Skin: Skin is warm and dry. No rash noted. Psychiatric: Normal mood and affect.      LABORATORY PANEL:   CBC  Recent Labs Lab 08/24/16 1453 08/25/16 0321  WBC 5.7  --   HGB 9.6* 8.6*  HCT 29.8*  --   PLT 189  --    ------------------------------------------------------------------------------------------------------------------  Chemistries   Recent Labs Lab 08/26/16 0423  NA 136  K 3.9  CL 110  CO2 22  GLUCOSE 95  BUN 32*  CREATININE 1.23*  CALCIUM 8.7*   ------------------------------------------------------------------------------------------------------------------  Cardiac Enzymes  Recent Labs Lab 08/24/16 1453 08/24/16 2055 08/25/16 0321  TROPONINI 1.15* 0.91* 0.87*   ------------------------------------------------------------------------------------------------------------------  RADIOLOGY:  Dg Chest 2 View  Result Date: 08/24/2016 CLINICAL DATA:  Abnormal electrocardiogram.  Chest pain EXAM: CHEST  2 VIEW COMPARISON:  November 11, 2015 FINDINGS: There is no edema or consolidation. Heart size and pulmonary vascularity are within normal limits. There is aortic atherosclerosis. There is left anterior descending coronary artery calcification. No adenopathy. There is thoracolumbar levoscoliosis. IMPRESSION: Aortic atherosclerosis. Left anterior descending coronary artery calcification. Scoliosis. No edema or consolidation. Electronically Signed   By: Lowella Grip III M.D.   On: 08/24/2016 15:22     ASSESSMENT AND PLAN:    83 year old female with COPD and Barrett's esophagus who presents with non-ST elevation MI   1. Non-ST elevation MI with troponins trending down Troponin max 1.15  Continue IV heparin, aspirin, Lipitor, metoprolol, statin Cardiac catheterization planned for  Monday Appreciate cartilage consult Follow up on echocardiogram   2. Essential hypertension: Continue lisinopril and metoprolol  3.  Depression: Continue Remeron  4. Barrett's esophagus: Continue PPI  Management plans discussed with the patient and she is in agreement.  CODE STATUS: full  TOTAL TIME TAKING CARE OF THIS PATIENT: 24 minutes.    POSSIBLE D/C tuesday, DEPENDING ON CLINICAL CONDITION.   Candler Ginsberg M.D on 08/26/2016 at 8:14 AM  Between 7am to 6pm - Pager - [phone] After 6pm go to [email or web address] - password [email or web address] [facility]  Office  [phone]  CC: Primary care physician; Crecencio Mc, MD  Note: This dictation was prepared with Dragon dictation along with smaller phrase technology. Any transcriptional errors that result from this process are unintentional.

## 2016-08-26 NOTE — Progress Notes (Signed)
       [facility]  SUBJECTIVE: denies chest pain   Vitals:   08/25/16 1952 08/26/16 0330 08/26/16 0807 08/26/16 1353  BP:  (!) 131/54 122/79 (!) 136/55  Pulse:  65 (!) 58 (!) 55  Resp:  18 18 14   Temp: 98.9 F (37.2 C) 98.6 F (37 C)    TempSrc: Oral Oral    SpO2:  96% 98% 98%  Weight:      Height:        Intake/Output Summary (Last 24 hours) at 08/26/16 1422 Last data filed at 08/26/16 1412  Gross per 24 hour  Intake           696.73 ml  Output             1000 ml  Net          -303.27 ml    LABS: Basic Metabolic Panel:  Recent Labs  08/25/16 0321 08/26/16 0423  NA 138 136  K 4.3 3.9  CL 111 110  CO2 23 22  GLUCOSE 94 95  BUN 25* 32*  CREATININE 1.36* 1.23*  CALCIUM 8.5* 8.7*   Liver Function Tests: No results for input(s): AST, ALT, ALKPHOS, BILITOT, PROT, ALBUMIN in the last 72 hours. No results for input(s): LIPASE, AMYLASE in the last 72 hours. CBC:  Recent Labs  08/24/16 1453 08/25/16 0321  WBC 5.7  --   HGB 9.6* 8.6*  HCT 29.8*  --   MCV 89.0  --   PLT 189  --    Cardiac Enzymes:  Recent Labs  08/24/16 1453 08/24/16 2055 08/25/16 0321  TROPONINI 1.15* 0.91* 0.87*   BNP: Invalid input(s): POCBNP D-Dimer: No results for input(s): DDIMER in the last 72 hours. Hemoglobin A1C: No results for input(s): HGBA1C in the last 72 hours. Fasting Lipid Panel:  Recent Labs  08/25/16 0321  CHOL 134  HDL 47  LDLCALC 74  TRIG 65  CHOLHDL 2.9   Thyroid Function Tests: No results for input(s): TSH, T4TOTAL, T3FREE, THYROIDAB in the last 72 hours.  Invalid input(s): FREET3 Anemia Panel: No results for input(s): VITAMINB12, FOLATE, FERRITIN, TIBC, IRON, RETICCTPCT in the last 72 hours.   Physical Exam: Blood pressure (!) 136/55, pulse (!) 55, temperature 98.6 F (37 C), temperature source Oral, resp. rate 14, height 5\' 5"  (1.651 m), weight 55.3 kg (122 lb), SpO2 98 %.   Wt Readings from  Last 1 Encounters:  08/24/16 55.3 kg (122 lb)     General appearance: alert and cooperative Resp: clear to auscultation bilaterally Cardio: regular rate and rhythm GI: soft, non-tender; bowel sounds normal; no masses,  no organomegaly Pulses: 2+ and symmetric Neurologic: Grossly normal  TELEMETRY: Reviewed telemetry pt in nsr:  ASSESSMENT AND PLAN:  Active Problems:   NSTEMI (non-ST elevated myocardial infarction) (HCC)-ruled in for nstemi. Currently pain free with asa, heparin, metoprolol, high intensity satin. Will proceed with left cardiac cath in am to guide further therapy. Further recs after cath is complete.     Teodoro Spray, MD, [facility] 08/26/2016 2:22 PM

## 2016-08-27 ENCOUNTER — Encounter
Admission: EM | Disposition: A | Discharge status: Other Acute Inpatient Hospital | Payer: Self-pay | Source: Home / Self Care | Attending: Internal Medicine

## 2016-08-27 HISTORY — PX: LEFT HEART CATH AND CORONARY ANGIOGRAPHY: CATH118249

## 2016-08-27 LAB — CBC
HEMATOCRIT: 27.8 % — AB (ref 35.0–47.0)
HEMOGLOBIN: 9.2 g/dL — AB (ref 12.0–16.0)
MCH: 29.3 pg (ref 26.0–34.0)
MCHC: 33.1 g/dL (ref 32.0–36.0)
MCV: 88.3 fL (ref 80.0–100.0)
Platelets: 160 10*3/uL (ref 150–440)
RBC: 3.15 MIL/uL — AB (ref 3.80–5.20)
RDW: 13.6 % (ref 11.5–14.5)
WBC: 4.1 10*3/uL (ref 3.6–11.0)

## 2016-08-27 LAB — BASIC METABOLIC PANEL
ANION GAP: 6 (ref 5–15)
BUN: 35 mg/dL — AB (ref 6–20)
CHLORIDE: 110 mmol/L (ref 101–111)
CO2: 23 mmol/L (ref 22–32)
Calcium: 9.1 mg/dL (ref 8.9–10.3)
Creatinine, Ser: 1.12 mg/dL — ABNORMAL HIGH (ref 0.44–1.00)
GFR calc Af Amer: 52 mL/min — ABNORMAL LOW (ref >60)
GFR, EST NON AFRICAN AMERICAN: 44 mL/min — AB (ref >60)
GLUCOSE: 101 mg/dL — AB (ref 65–99)
POTASSIUM: 4.2 mmol/L (ref 3.5–5.1)
Sodium: 139 mmol/L (ref 135–145)

## 2016-08-27 LAB — HEPARIN LEVEL (UNFRACTIONATED)
HEPARIN UNFRACTIONATED: 0.27 [IU]/mL — AB (ref 0.30–0.70)
Heparin Unfractionated: 0.44 IU/mL (ref 0.30–0.70)

## 2016-08-27 SURGERY — LEFT HEART CATH AND CORONARY ANGIOGRAPHY
Anesthesia: Moderate Sedation

## 2016-08-27 MED ORDER — LOPERAMIDE HCL 2 MG PO CAPS
2.0000 mg | ORAL_CAPSULE | ORAL | Status: DC | PRN
  Administered 2016-08-27 (×2): 2 mg via ORAL
  Filled 2016-08-27 (×2): qty 1

## 2016-08-27 MED ORDER — MIDAZOLAM HCL 2 MG/2ML IJ SOLN
INTRAMUSCULAR | Status: AC
  Filled 2016-08-27: qty 2

## 2016-08-27 MED ORDER — HEPARIN BOLUS VIA INFUSION
800.0000 [IU] | Freq: Once | INTRAVENOUS | Status: AC
  Administered 2016-08-27: 800 [IU] via INTRAVENOUS
  Filled 2016-08-27: qty 800

## 2016-08-27 MED ORDER — HEPARIN (PORCINE) IN NACL 100-0.45 UNIT/ML-% IJ SOLN
1050.0000 [IU]/h | INTRAMUSCULAR | Status: DC
  Administered 2016-08-27: 1050 [IU]/h via INTRAVENOUS
  Filled 2016-08-27: qty 250

## 2016-08-27 MED ORDER — HEPARIN (PORCINE) IN NACL 100-0.45 UNIT/ML-% IJ SOLN
INTRAMUSCULAR | Status: AC
  Filled 2016-08-27: qty 250

## 2016-08-27 MED ORDER — ASPIRIN 81 MG PO TBEC: 81.0000 mg | DELAYED_RELEASE_TABLET | Freq: Every day | ORAL | Status: DC

## 2016-08-27 MED ORDER — METOPROLOL TARTRATE 25 MG PO TABS: 12.5000 mg | ORAL_TABLET | Freq: Two times a day (BID) | ORAL | 0 refills | Status: DC

## 2016-08-27 MED ORDER — ATORVASTATIN CALCIUM 40 MG PO TABS: 40.0000 mg | ORAL_TABLET | Freq: Every day | ORAL | 0 refills | Status: DC

## 2016-08-27 MED ORDER — IOPAMIDOL (ISOVUE-300) INJECTION 61%
INTRAVENOUS | Status: DC | PRN
  Administered 2016-08-27: 96 mL via INTRA_ARTERIAL

## 2016-08-27 MED ORDER — HEPARIN (PORCINE) IN NACL 2-0.9 UNIT/ML-% IJ SOLN
INTRAMUSCULAR | Status: AC
  Filled 2016-08-27: qty 500

## 2016-08-27 MED ORDER — MIDAZOLAM HCL 2 MG/2ML IJ SOLN
INTRAMUSCULAR | Status: DC | PRN
  Administered 2016-08-27: 1 mg via INTRAVENOUS

## 2016-08-27 SURGICAL SUPPLY — 9 items
CATH INFINITI 5FR ANG PIGTAIL (CATHETERS) ×3 IMPLANT
CATH INFINITI 5FR JL4 (CATHETERS) ×3 IMPLANT
CATH INFINITI 5FR JL5 (CATHETERS) ×3 IMPLANT
CATH INFINITI JR4 5F (CATHETERS) ×3 IMPLANT
GUIDEWIRE 3MM J TIP .035 145 (WIRE) ×3 IMPLANT
KIT MANI 3VAL PERCEP (MISCELLANEOUS) ×3 IMPLANT
NEEDLE PERC 18GX7CM (NEEDLE) ×3 IMPLANT
PACK CARDIAC CATH (CUSTOM PROCEDURE TRAY) ×3 IMPLANT
SHEATH AVANTI 5FR X 11CM (SHEATH) ×3 IMPLANT

## 2016-08-27 NOTE — Progress Notes (Signed)
Patient back from cath. - Report taken by Josefina Do while this nurse was off floor. Bedside check with Robin RN, site WDL, heparin gtt infusing. Patient was able to sit up at 2:10pm and can get out of bed at 3:10pm per Shirlean Mylar RN, Special Procedures. Patient came up with lunch tray. Will continue to monitor.

## 2016-08-27 NOTE — Progress Notes (Signed)
ANTICOAGULATION CONSULT NOTE - Initial Consult  Pharmacy Consult for heparin bolus and drip Indication: chest pain/ACS  Allergies  Allergen Reactions  . Morphine Sulfate Other (See Comments)    Reaction:  Hallucinations     Patient Measurements: Height: 5\' 5"  (165.1 cm) Weight: 122 lb (55.3 kg) IBW/kg (Calculated) : 57 Heparin Dosing Weight: 55.3 kg  Vital Signs: Temp: 98.3 F (36.8 C) (05/07 0510) Temp Source: Oral (05/06 1956) BP: 152/77 (05/07 0510) Pulse Rate: 62 (05/07 0510)  Labs:  Recent Labs  08/24/16 1453 08/24/16 2055 08/25/16 0321 08/25/16 1009  08/26/16 0423 08/26/16 1401 08/26/16 2218 08/27/16 0452  HGB 9.6*  --  8.6*  --   --   --  8.9*  --  9.2*  HCT 29.8*  --   --   --   --   --  26.7*  --  27.8*  PLT 189  --   --   --   --   --  159  --  160  APTT  --   --   --  26  --   --   --   --   --   LABPROT  --   --   --  13.6  --   --   --   --   --   INR  --   --   --  1.04  --   --   --   --   --   HEPARINUNFRC  --   --   --   --   < > 0.21* 0.37 0.38 0.44  CREATININE 1.26*  --  1.36*  --   --  1.23*  --   --  1.12*  TROPONINI 1.15* 0.91* 0.87*  --   --   --   --   --   --   < > = values in this interval not displayed.  Estimated Creatinine Clearance: 33.8 mL/min (A) (by C-G formula based on SCr of 1.12 mg/dL (H)).   Medical History: Past Medical History:  Diagnosis Date  . Anemia 2012   of acute blood loss, resolved  . Anxiety   . Aortic aneurysm ([place])   . Arthritis   . Barrett's esophagus 2007  . Bursitis 2012   left hip, improved with periodic steroid injection [facility] [place])  . COPD (chronic obstructive pulmonary disease) ([place])   . Depression   . GERD (gastroesophageal reflux disease)    with Barretts Esophagus  . Hyperlipidemia   . Hypertension   . Lumbago   . Macular degeneration   . Osteoporosis   . Sigmoid diverticulosis    by colonoscopy    Medications:  Scheduled:  . aspirin  324 mg Oral Once  . aspirin EC  81 mg Oral  Daily  . atorvastatin  40 mg Oral q1800  . heparin  800 Units Intravenous Once  . lisinopril  20 mg Oral Daily  . metoprolol tartrate  12.5 mg Oral BID  . mirtazapine  45 mg Oral QHS  . mometasone-formoterol  2 puff Inhalation BID  . pantoprazole  40 mg Oral Daily  . sodium chloride flush  3 mL Intravenous Q12H  . sodium chloride flush  3 mL Intravenous Q12H   Infusions:  . sodium chloride    . sodium chloride    . sodium chloride    . heparin 950 Units/hr (08/26/16 1429)    Assessment: Pharmacy consulted to dose heparin bolus and drip  in this 62 yoF with ACS/chest pain. Patient received enoxaparin 40 mg 5/4 @ 2200. Per PTA med list, no anticoag. Plan is for cardiac cath on Monday. Baseline labs ordered.  Goal of Therapy:  Heparin level 0.3-0.7 units/ml Monitor platelets by anticoagulation protocol: Yes   Plan:  Give 3000 units bolus x 1 Start heparin infusion at 650 units/hr Check anti-Xa level in 8 hours and daily while on heparin Continue to monitor H&H and platelets   5/5:  HL @ 1909 = 0.12 Will order Heparin 1650 units IV X 1 bolus and increase drip rate to 850 units/hr.  Will recheck HL 8 hrs after rate change.  5/6 AM heparin level 0.21. 800 unit bolus and increase rate to 950 units/hr. Recheck in 8 hours.   5/6 Heparin level resulted @ 0.37. Will continue current rate of heparin. Will recheck Heparin level @ 22:00.   5/6 22:00 heparin level 0.38. Continue current regimen. Recheck with tomorrow AM labs.  5/7 AM heparin level 0.44. Continue current regimen. Recheck heparin level and CBC with tomorrow AM labs.  Larene Beach, PharmD   08/27/2016 5:59 AM

## 2016-08-27 NOTE — Progress Notes (Signed)
[place] [facility] [place] NAME: Anna Prince    MR#:  [id number]  DATE OF BIRTH:  13-Dec-1931  SUBJECTIVE:    Patient reports no chest pain yesterday. Plan for cardiac catheter this a.m.  REVIEW OF SYSTEMS:    Review of Systems  Constitutional: Negative for fever, chills weight loss HENT: Negative for ear pain, nosebleeds, congestion, facial swelling, rhinorrhea, neck pain, neck stiffness and ear discharge.   Respiratory: Negative for cough, shortness of breath, wheezing  Cardiovascular: Denies chest pain no palpitations and leg swelling.  Gastrointestinal: Negative for heartburn, abdominal pain, vomiting, diarrhea or consitpation Genitourinary: Negative for dysuria, urgency, frequency, hematuria Musculoskeletal: Negative for back pain or joint pain Neurological: Negative for dizziness, seizures, syncope, focal weakness,  numbness and headaches.  Hematological: Does not bruise/bleed easily.  Psychiatric/Behavioral: Negative for hallucinations, confusion, dysphoric mood    Tolerating Diet: Nothing by mouth    DRUG ALLERGIES:   Allergies  Allergen Reactions  . Morphine Sulfate Other (See Comments)    Reaction:  Hallucinations     VITALS:  Blood pressure (!) 152/77, pulse 62, temperature 98.3 F (36.8 C), resp. rate 18, height 5\' 5"  (1.651 m), weight 55.3 kg (122 lb), SpO2 97 %.  PHYSICAL EXAMINATION:  Constitutional: Appears well-developed and well-nourished. No distress. HENT: Normocephalic. Marland Kitchen Oropharynx is clear and moist.  Eyes: Conjunctivae and EOM are normal. PERRLA, no scleral icterus.  Neck: Normal ROM. Neck supple. No JVD. No tracheal deviation. CVS: RRR, S1/S2 +, no murmurs, no gallops, no carotid bruit.  Pulmonary: Effort and breath sounds normal, no stridor, rhonchi, wheezes, rales.  Abdominal: Soft. BS +,  no distension, tenderness, rebound or guarding.  Musculoskeletal: Normal range of motion. No edema and no  tenderness.  Neuro: Alert. CN 2-12 grossly intact. No focal deficits. Skin: Skin is warm and dry. No rash noted. Psychiatric: Normal mood and affect.      LABORATORY PANEL:   CBC  Recent Labs Lab 08/27/16 0452  WBC 4.1  HGB 9.2*  HCT 27.8*  PLT 160   ------------------------------------------------------------------------------------------------------------------  Chemistries   Recent Labs Lab 08/27/16 0452  NA 139  K 4.2  CL 110  CO2 23  GLUCOSE 101*  BUN 35*  CREATININE 1.12*  CALCIUM 9.1   ------------------------------------------------------------------------------------------------------------------  Cardiac Enzymes  Recent Labs Lab 08/24/16 1453 08/24/16 2055 08/25/16 0321  TROPONINI 1.15* 0.91* 0.87*   ------------------------------------------------------------------------------------------------------------------  RADIOLOGY:  No results found.   ASSESSMENT AND PLAN:    83 year old female with COPD and Barrett's esophagus who presents with non-ST elevation MI   1. Non-ST elevation MI with troponins trending down Troponin max 1.15  Continue IV heparin, aspirin, Lipitor, metoprolol, statin Cardiac catheterization planned for This morning. Appreciate cardiology consult Echocardiogram shows diastolic dysfunction with normal ejection fraction no wall motion abnormalities  2. Essential hypertension: Continue lisinopril and metoprolol  3. Depression: Continue Remeron  4. Barrett's esophagus: Continue PPI  Management plans discussed with the patient and she is in agreement.  CODE STATUS: full  TOTAL TIME TAKING CARE OF THIS PATIENT: 22 minutes.    POSSIBLE D/C tuesday, DEPENDING ON cardiac catheterization  Jourden Gilson M.D on 08/27/2016 at 6:37 AM  Between 7am to 6pm - Pager - [phone] After 6pm go to [email or web address] - password [email or web address] [facility]  Office  [phone]  CC: Primary care physician; Crecencio Mc, MD  Note: This dictation was prepared with Dragon dictation along with smaller phrase technology. Any transcriptional  errors that result from this process are unintentional.

## 2016-08-27 NOTE — Care Management Note (Signed)
Case Management Note  Patient Details  Name: Anna Prince MRN: [id number] Date of Birth: 1931-09-23  Subjective/Objective:                 Presented from home with chest pain of several day duration.  Admitted with nstemi and for cardiac cath today.  Independent in all adls, denies issues accessing medical care, obtaining medications or with transportation.  Current with her PCP.   Action/Plan:   Expected Discharge Date:                  Expected Discharge Plan:     In-House Referral:     Discharge planning Services     Post Acute Care Choice:    Choice offered to:     DME Arranged:    DME Agency:     HH Arranged:    HH Agency:     Status of Service:     If discussed at [employer] of Stay Meetings, dates discussed:    Additional Comments:  Katrina Stack, RN 08/27/2016, 9:11 AM

## 2016-08-27 NOTE — Progress Notes (Signed)
[facility] for heparin bolus and drip Indication: chest pain/ACS       Allergies  Allergen Reactions  . Morphine Sulfate Other (See Comments)    Reaction:  Hallucinations     Patient Measurements: Height: 5\' 5"  (165.1 cm) Weight: 122 lb (55.3 kg) IBW/kg (Calculated) : 57 Heparin Dosing Weight: 55.3 kg  Vital Signs: Temp: 97.6 F (36.4 C) (05/07 1448) Temp Source: Oral (05/07 1448) BP: 155/60 (05/07 1448) Pulse Rate: 68 (05/07 1448)  Labs:  Recent Labs (last 2 labs)    Recent Labs  08/24/16 2055  08/25/16 0321 08/25/16 1009  08/26/16 0423 08/26/16 1401 08/26/16 2218 08/27/16 0452  HGB  --   < > 8.6*  --   --   --  8.9*  --  9.2*  HCT  --   --   --   --   --   --  26.7*  --  27.8*  PLT  --   --   --   --   --   --  159  --  160  APTT  --   --   --  26  --   --   --   --   --   LABPROT  --   --   --  13.6  --   --   --   --   --   INR  --   --   --  1.04  --   --   --   --   --   HEPARINUNFRC  --   --   --   --   < > 0.21* 0.37 0.38 0.44  CREATININE  --   --  1.36*  --   --  1.23*  --   --  1.12*  TROPONINI 0.91*  --  0.87*  --   --   --   --   --   --   < > = values in this interval not displayed.    Estimated Creatinine Clearance: 33.8 mL/min (A) (by C-G formula based on SCr of 1.12 mg/dL (H)).   Medical History:     Past Medical History:  Diagnosis Date  . Anemia 2012   of acute blood loss, resolved  . Anxiety   . Aortic aneurysm ([place])   . Arthritis   . Barrett's esophagus 2007  . Bursitis 2012   left hip, improved with periodic steroid injection [facility] [place])  . COPD (chronic obstructive pulmonary disease) ([place])   . Depression   . GERD (gastroesophageal reflux disease)    with Barretts Esophagus  . Hyperlipidemia   . Hypertension   . Lumbago   . Macular degeneration   . Osteoporosis   . Sigmoid diverticulosis    by colonoscopy    Medications:  Scheduled:  . aspirin   324 mg Oral Once  . aspirin EC  81 mg Oral Daily  . atorvastatin  40 mg Oral q1800  . heparin  800 Units Intravenous Once  . lisinopril  20 mg Oral Daily  . metoprolol tartrate  12.5 mg Oral BID  . mirtazapine  45 mg Oral QHS  . mometasone-formoterol  2 puff Inhalation BID  . pantoprazole  40 mg Oral Daily  . sodium chloride flush  3 mL Intravenous Q12H   Infusions:  . heparin    . sodium chloride    . heparin 950 Units/hr (08/27/16 1414)  Assessment: Pharmacy consulted to dose heparin bolus and drip in this 60 yoF with ACS/chest pain. Patient received enoxaparin 40 mg 5/4 @ 2200. Per PTA med list, no anticoag. Plan is for cardiac cath on Monday. Baseline labs ordered.   5/7 Post-Cath - per MD patient to continue on Heparin drip and will be going for CABG  Goal of Therapy:  Heparin level 0.3-0.7 units/ml Monitor platelets by anticoagulation protocol: Yes   Plan:  Will continue with Heparin 950 units/hr as patient was therapeutic on this rate. Will check a Heparin level 8 hours after restart.    5/7 @ 2200 HL 0.27 subtherapeutic. Will rebolus w/ heparin 800 units IV x 1, will increase rate to 1050 units/hr and recheck HL @ 0700.  Thank you for this consult.  Tobie Lords, PharmD, BCPS Clinical Pharmacist 08/27/2016

## 2016-08-27 NOTE — Care Management Important Message (Signed)
Important Message  Patient Details  Name: Anna Prince MRN: [id number] Date of Birth: 07/25/1931   Medicare Important Message Given:  Yes Signed IM notice given    Katrina Stack, RN 08/27/2016, 3:46 PM

## 2016-08-27 NOTE — Progress Notes (Signed)
Patient complaining of upset stomach, reported one episode of soft stool, not witnessed by RN. Pt requesting imodium or pepto-bismol. Dr. Benjie Karvonen notified - ok to order imodium PRN.

## 2016-08-27 NOTE — Progress Notes (Signed)
[facility] for heparin bolus and drip Indication: chest pain/ACS  Allergies  Allergen Reactions  . Morphine Sulfate Other (See Comments)    Reaction:  Hallucinations     Patient Measurements: Height: 5\' 5"  (165.1 cm) Weight: 122 lb (55.3 kg) IBW/kg (Calculated) : 57 Heparin Dosing Weight: 55.3 kg  Vital Signs: Temp: 97.6 F (36.4 C) (05/07 1448) Temp Source: Oral (05/07 1448) BP: 155/60 (05/07 1448) Pulse Rate: 68 (05/07 1448)  Labs:  Recent Labs  08/24/16 2055  08/25/16 0321 08/25/16 1009  08/26/16 0423 08/26/16 1401 08/26/16 2218 08/27/16 0452  HGB  --   < > 8.6*  --   --   --  8.9*  --  9.2*  HCT  --   --   --   --   --   --  26.7*  --  27.8*  PLT  --   --   --   --   --   --  159  --  160  APTT  --   --   --  26  --   --   --   --   --   LABPROT  --   --   --  13.6  --   --   --   --   --   INR  --   --   --  1.04  --   --   --   --   --   HEPARINUNFRC  --   --   --   --   < > 0.21* 0.37 0.38 0.44  CREATININE  --   --  1.36*  --   --  1.23*  --   --  1.12*  TROPONINI 0.91*  --  0.87*  --   --   --   --   --   --   < > = values in this interval not displayed.  Estimated Creatinine Clearance: 33.8 mL/min (A) (by C-G formula based on SCr of 1.12 mg/dL (H)).   Medical History: Past Medical History:  Diagnosis Date  . Anemia 2012   of acute blood loss, resolved  . Anxiety   . Aortic aneurysm ([place])   . Arthritis   . Barrett's esophagus 2007  . Bursitis 2012   left hip, improved with periodic steroid injection [facility] [place])  . COPD (chronic obstructive pulmonary disease) ([place])   . Depression   . GERD (gastroesophageal reflux disease)    with Barretts Esophagus  . Hyperlipidemia   . Hypertension   . Lumbago   . Macular degeneration   . Osteoporosis   . Sigmoid diverticulosis    by colonoscopy    Medications:  Scheduled:  . aspirin  324 mg Oral Once  . aspirin EC  81 mg Oral Daily  . atorvastatin  40 mg  Oral q1800  . heparin  800 Units Intravenous Once  . lisinopril  20 mg Oral Daily  . metoprolol tartrate  12.5 mg Oral BID  . mirtazapine  45 mg Oral QHS  . mometasone-formoterol  2 puff Inhalation BID  . pantoprazole  40 mg Oral Daily  . sodium chloride flush  3 mL Intravenous Q12H   Infusions:  . heparin    . sodium chloride    . heparin 950 Units/hr (08/27/16 1414)    Assessment: Pharmacy consulted to dose heparin bolus and drip in this 29 yoF with ACS/chest pain. Patient received enoxaparin  40 mg 5/4 @ 2200. Per PTA med list, no anticoag. Plan is for cardiac cath on Monday. Baseline labs ordered.   5/7 Post-Cath - per MD patient to continue on Heparin drip and will be going for CABG  Goal of Therapy:  Heparin level 0.3-0.7 units/ml Monitor platelets by anticoagulation protocol: Yes   Plan:  Will continue with Heparin 950 units/hr as patient was therapeutic on this rate. Will check a Heparin level 8 hours after restart.  Paulina Fusi, PharmD, BCPS 08/27/2016 2:55 PM

## 2016-08-27 NOTE — Progress Notes (Signed)
Per Dr. Benjie Karvonen, patient can discharge home if no interventions during cardiac cath.

## 2016-08-28 ENCOUNTER — Other Ambulatory Visit: Payer: Self-pay | Admitting: *Deleted

## 2016-08-28 ENCOUNTER — Encounter: Payer: Self-pay | Admitting: Cardiology

## 2016-08-28 ENCOUNTER — Inpatient Hospital Stay (HOSPITAL_COMMUNITY)
Admission: AD | Admit: 2016-08-28 | Discharge: 2016-09-02 | DRG: 246 | Disposition: A | Discharge status: Home / Self Care | Payer: Medicare Other | Source: Other Acute Inpatient Hospital | Attending: Cardiovascular Disease | Admitting: Cardiovascular Disease

## 2016-08-28 DIAGNOSIS — I9589 Other hypotension: Secondary | ICD-10-CM | POA: Diagnosis not present

## 2016-08-28 DIAGNOSIS — Z8679 Personal history of other diseases of the circulatory system: Secondary | ICD-10-CM | POA: Diagnosis present

## 2016-08-28 DIAGNOSIS — I2510 Atherosclerotic heart disease of native coronary artery without angina pectoris: Secondary | ICD-10-CM | POA: Diagnosis not present

## 2016-08-28 DIAGNOSIS — N183 Chronic kidney disease, stage 3 (moderate): Secondary | ICD-10-CM | POA: Diagnosis present

## 2016-08-28 DIAGNOSIS — K219 Gastro-esophageal reflux disease without esophagitis: Secondary | ICD-10-CM | POA: Diagnosis present

## 2016-08-28 DIAGNOSIS — J432 Centrilobular emphysema: Secondary | ICD-10-CM | POA: Diagnosis not present

## 2016-08-28 DIAGNOSIS — Z955 Presence of coronary angioplasty implant and graft: Secondary | ICD-10-CM

## 2016-08-28 DIAGNOSIS — I21A1 Myocardial infarction type 2: Secondary | ICD-10-CM | POA: Diagnosis present

## 2016-08-28 DIAGNOSIS — I25110 Atherosclerotic heart disease of native coronary artery with unstable angina pectoris: Secondary | ICD-10-CM | POA: Diagnosis not present

## 2016-08-28 DIAGNOSIS — M810 Age-related osteoporosis without current pathological fracture: Secondary | ICD-10-CM | POA: Diagnosis present

## 2016-08-28 DIAGNOSIS — I129 Hypertensive chronic kidney disease with stage 1 through stage 4 chronic kidney disease, or unspecified chronic kidney disease: Secondary | ICD-10-CM | POA: Diagnosis not present

## 2016-08-28 DIAGNOSIS — K259 Gastric ulcer, unspecified as acute or chronic, without hemorrhage or perforation: Secondary | ICD-10-CM | POA: Diagnosis present

## 2016-08-28 DIAGNOSIS — K2971 Gastritis, unspecified, with bleeding: Secondary | ICD-10-CM | POA: Diagnosis not present

## 2016-08-28 DIAGNOSIS — I10 Essential (primary) hypertension: Secondary | ICD-10-CM | POA: Diagnosis not present

## 2016-08-28 DIAGNOSIS — K922 Gastrointestinal hemorrhage, unspecified: Secondary | ICD-10-CM | POA: Diagnosis not present

## 2016-08-28 DIAGNOSIS — K222 Esophageal obstruction: Secondary | ICD-10-CM | POA: Diagnosis not present

## 2016-08-28 DIAGNOSIS — E782 Mixed hyperlipidemia: Secondary | ICD-10-CM | POA: Diagnosis not present

## 2016-08-28 DIAGNOSIS — Z7982 Long term (current) use of aspirin: Secondary | ICD-10-CM | POA: Diagnosis not present

## 2016-08-28 DIAGNOSIS — I25118 Atherosclerotic heart disease of native coronary artery with other forms of angina pectoris: Secondary | ICD-10-CM | POA: Diagnosis not present

## 2016-08-28 DIAGNOSIS — Z820 Family history of epilepsy and other diseases of the nervous system: Secondary | ICD-10-CM | POA: Diagnosis not present

## 2016-08-28 DIAGNOSIS — K449 Diaphragmatic hernia without obstruction or gangrene: Secondary | ICD-10-CM | POA: Diagnosis present

## 2016-08-28 DIAGNOSIS — Z7951 Long term (current) use of inhaled steroids: Secondary | ICD-10-CM

## 2016-08-28 DIAGNOSIS — F17210 Nicotine dependence, cigarettes, uncomplicated: Secondary | ICD-10-CM | POA: Diagnosis present

## 2016-08-28 DIAGNOSIS — Z8249 Family history of ischemic heart disease and other diseases of the circulatory system: Secondary | ICD-10-CM

## 2016-08-28 DIAGNOSIS — E785 Hyperlipidemia, unspecified: Secondary | ICD-10-CM | POA: Diagnosis not present

## 2016-08-28 DIAGNOSIS — J449 Chronic obstructive pulmonary disease, unspecified: Secondary | ICD-10-CM | POA: Diagnosis not present

## 2016-08-28 DIAGNOSIS — I160 Hypertensive urgency: Secondary | ICD-10-CM | POA: Diagnosis present

## 2016-08-28 DIAGNOSIS — I714 Abdominal aortic aneurysm, without rupture: Secondary | ICD-10-CM | POA: Diagnosis not present

## 2016-08-28 DIAGNOSIS — K2270 Barrett's esophagus without dysplasia: Secondary | ICD-10-CM | POA: Diagnosis not present

## 2016-08-28 DIAGNOSIS — K921 Melena: Secondary | ICD-10-CM | POA: Diagnosis not present

## 2016-08-28 DIAGNOSIS — D62 Acute posthemorrhagic anemia: Secondary | ICD-10-CM | POA: Diagnosis not present

## 2016-08-28 DIAGNOSIS — D509 Iron deficiency anemia, unspecified: Secondary | ICD-10-CM | POA: Diagnosis not present

## 2016-08-28 DIAGNOSIS — I959 Hypotension, unspecified: Secondary | ICD-10-CM | POA: Diagnosis present

## 2016-08-28 DIAGNOSIS — H3530 Unspecified macular degeneration: Secondary | ICD-10-CM | POA: Diagnosis not present

## 2016-08-28 DIAGNOSIS — Z79899 Other long term (current) drug therapy: Secondary | ICD-10-CM

## 2016-08-28 DIAGNOSIS — I214 Non-ST elevation (NSTEMI) myocardial infarction: Secondary | ICD-10-CM | POA: Diagnosis not present

## 2016-08-28 DIAGNOSIS — D500 Iron deficiency anemia secondary to blood loss (chronic): Secondary | ICD-10-CM | POA: Diagnosis not present

## 2016-08-28 DIAGNOSIS — Z9889 Other specified postprocedural states: Secondary | ICD-10-CM

## 2016-08-28 HISTORY — DX: Abdominal aortic aneurysm, without rupture: I71.4

## 2016-08-28 HISTORY — DX: Abdominal aortic aneurysm, without rupture, unspecified: I71.40

## 2016-08-28 LAB — BASIC METABOLIC PANEL
ANION GAP: 5 (ref 5–15)
BUN: 31 mg/dL — ABNORMAL HIGH (ref 6–20)
CHLORIDE: 112 mmol/L — AB (ref 101–111)
CO2: 20 mmol/L — ABNORMAL LOW (ref 22–32)
Calcium: 8.8 mg/dL — ABNORMAL LOW (ref 8.9–10.3)
Creatinine, Ser: 1.24 mg/dL — ABNORMAL HIGH (ref 0.44–1.00)
GFR, EST AFRICAN AMERICAN: 46 mL/min — AB (ref >60)
GFR, EST NON AFRICAN AMERICAN: 39 mL/min — AB (ref >60)
Glucose, Bld: 107 mg/dL — ABNORMAL HIGH (ref 65–99)
POTASSIUM: 4.2 mmol/L (ref 3.5–5.1)
SODIUM: 137 mmol/L (ref 135–145)

## 2016-08-28 LAB — CBC
HCT: 25.7 % — ABNORMAL LOW (ref 35.0–47.0)
HEMOGLOBIN: 8.6 g/dL — AB (ref 12.0–16.0)
MCH: 29.5 pg (ref 26.0–34.0)
MCHC: 33.4 g/dL (ref 32.0–36.0)
MCV: 88.3 fL (ref 80.0–100.0)
PLATELETS: 150 10*3/uL (ref 150–440)
RBC: 2.91 MIL/uL — AB (ref 3.80–5.20)
RDW: 13.7 % (ref 11.5–14.5)
WBC: 6.7 10*3/uL (ref 3.6–11.0)

## 2016-08-28 LAB — HEPARIN LEVEL (UNFRACTIONATED)
HEPARIN UNFRACTIONATED: 0.52 [IU]/mL (ref 0.30–0.70)
Heparin Unfractionated: 0.41 IU/mL (ref 0.30–0.70)

## 2016-08-28 MED ORDER — SODIUM CHLORIDE 0.9% FLUSH
3.0000 mL | Freq: Two times a day (BID) | INTRAVENOUS | Status: DC
  Administered 2016-08-30 – 2016-09-01 (×3): 3 mL via INTRAVENOUS

## 2016-08-28 MED ORDER — ACETAMINOPHEN 325 MG PO TABS
650.0000 mg | ORAL_TABLET | ORAL | Status: DC | PRN
  Administered 2016-08-31 – 2016-09-02 (×2): 650 mg via ORAL
  Filled 2016-08-28 (×2): qty 2

## 2016-08-28 MED ORDER — SODIUM CHLORIDE 0.9% FLUSH: 3.0000 mL | INTRAVENOUS | Status: DC | PRN

## 2016-08-28 MED ORDER — HYDROCODONE-ACETAMINOPHEN 5-325 MG PO TABS
1.0000 | ORAL_TABLET | Freq: Four times a day (QID) | ORAL | Status: DC | PRN
  Administered 2016-08-29 – 2016-08-30 (×2): 1 via ORAL
  Filled 2016-08-28 (×2): qty 1

## 2016-08-28 MED ORDER — DIAZEPAM 5 MG PO TABS: 2.5000 mg | ORAL_TABLET | Freq: Two times a day (BID) | ORAL | Status: DC | PRN

## 2016-08-28 MED ORDER — NITROGLYCERIN 0.4 MG SL SUBL: 0.4000 mg | SUBLINGUAL_TABLET | SUBLINGUAL | Status: DC | PRN

## 2016-08-28 MED ORDER — MOMETASONE FURO-FORMOTEROL FUM 100-5 MCG/ACT IN AERO
2.0000 | INHALATION_SPRAY | Freq: Two times a day (BID) | RESPIRATORY_TRACT | Status: DC
  Administered 2016-08-28 – 2016-09-02 (×11): 2 via RESPIRATORY_TRACT
  Filled 2016-08-28 (×2): qty 8.8

## 2016-08-28 MED ORDER — METOPROLOL TARTRATE 12.5 MG HALF TABLET
12.5000 mg | ORAL_TABLET | Freq: Two times a day (BID) | ORAL | Status: DC
  Administered 2016-08-28 – 2016-08-31 (×7): 12.5 mg via ORAL
  Filled 2016-08-28 (×7): qty 1

## 2016-08-28 MED ORDER — LISINOPRIL 2.5 MG PO TABS
2.5000 mg | ORAL_TABLET | Freq: Every day | ORAL | Status: DC
  Administered 2016-08-28 – 2016-08-29 (×2): 2.5 mg via ORAL
  Filled 2016-08-28 (×3): qty 1

## 2016-08-28 MED ORDER — MIRTAZAPINE 7.5 MG PO TABS
45.0000 mg | ORAL_TABLET | Freq: Every day | ORAL | Status: DC
  Administered 2016-08-28 – 2016-09-01 (×4): 45 mg via ORAL
  Filled 2016-08-28: qty 6
  Filled 2016-08-28: qty 3
  Filled 2016-08-28: qty 1
  Filled 2016-08-28 (×2): qty 3

## 2016-08-28 MED ORDER — ONDANSETRON HCL 4 MG/2ML IJ SOLN: 4.0000 mg | Freq: Four times a day (QID) | INTRAMUSCULAR | Status: DC | PRN

## 2016-08-28 MED ORDER — ASPIRIN 300 MG RE SUPP: 300.0000 mg | RECTAL | Status: AC

## 2016-08-28 MED ORDER — ASPIRIN 81 MG PO CHEW
324.0000 mg | CHEWABLE_TABLET | ORAL | Status: AC
  Administered 2016-08-28: 324 mg via ORAL
  Filled 2016-08-28: qty 4

## 2016-08-28 MED ORDER — MEGESTROL ACETATE 40 MG PO TABS
40.0000 mg | ORAL_TABLET | Freq: Every day | ORAL | Status: DC
  Administered 2016-08-28 – 2016-09-02 (×5): 40 mg via ORAL
  Filled 2016-08-28 (×7): qty 1

## 2016-08-28 MED ORDER — ASPIRIN EC 325 MG PO TBEC
325.0000 mg | DELAYED_RELEASE_TABLET | Freq: Every day | ORAL | Status: DC
  Administered 2016-08-29: 325 mg via ORAL
  Filled 2016-08-28: qty 1

## 2016-08-28 MED ORDER — ATORVASTATIN CALCIUM 40 MG PO TABS
40.0000 mg | ORAL_TABLET | Freq: Every day | ORAL | Status: DC
  Administered 2016-08-28 – 2016-09-01 (×5): 40 mg via ORAL
  Filled 2016-08-28 (×5): qty 1

## 2016-08-28 MED ORDER — SODIUM CHLORIDE 0.9 % IV SOLN
250.0000 mL | INTRAVENOUS | Status: DC | PRN
  Administered 2016-09-02: 10:00:00 via INTRAVENOUS

## 2016-08-28 MED ORDER — PAROXETINE HCL 20 MG PO TABS
30.0000 mg | ORAL_TABLET | Freq: Every day | ORAL | Status: DC
  Administered 2016-08-28 – 2016-09-02 (×6): 30 mg via ORAL
  Filled 2016-08-28 (×6): qty 2

## 2016-08-28 MED ORDER — MAGNESIUM HYDROXIDE 400 MG/5ML PO SUSP
30.0000 mL | Freq: Every day | ORAL | Status: DC | PRN
  Administered 2016-08-28: 30 mL via ORAL
  Filled 2016-08-28: qty 30

## 2016-08-28 MED ORDER — PANTOPRAZOLE SODIUM 40 MG PO TBEC
40.0000 mg | DELAYED_RELEASE_TABLET | Freq: Every day | ORAL | Status: DC
  Administered 2016-08-28 – 2016-08-31 (×4): 40 mg via ORAL
  Filled 2016-08-28 (×4): qty 1

## 2016-08-28 MED ORDER — HEPARIN (PORCINE) IN NACL 100-0.45 UNIT/ML-% IJ SOLN
1250.0000 [IU]/h | INTRAMUSCULAR | Status: DC
  Administered 2016-08-28 – 2016-08-29 (×2): 1050 [IU]/h via INTRAVENOUS
  Administered 2016-08-31: 1250 [IU]/h via INTRAVENOUS
  Filled 2016-08-28 (×3): qty 250

## 2016-08-28 NOTE — Discharge Summary (Signed)
[facility] [place] [facility] [place] NAME: Anna Prince    MR#:  [id number]  DATE OF BIRTH:  Nov 21, 1931  DATE OF ADMISSION:  08/24/2016 ADMITTING PHYSICIAN: Demetrios Loll, MD  DATE OF DISCHARGE: 08/28/2016  PRIMARY CARE PHYSICIAN: Crecencio Mc, MD    ADMISSION DIAGNOSIS:  NSTEMI (non-ST elevated myocardial infarction) ([place]) [I21.4]  DISCHARGE DIAGNOSIS:  Active Problems:   NSTEMI (non-ST elevated myocardial infarction) ([place])   SECONDARY DIAGNOSIS:   Past Medical History:  Diagnosis Date  . Anemia 2012   of acute blood loss, resolved  . Anxiety   . Aortic aneurysm ([place])   . Arthritis   . Barrett's esophagus 2007  . Bursitis 2012   left hip, improved with periodic steroid injection [facility] [place])  . COPD (chronic obstructive pulmonary disease) ([place])   . Depression   . GERD (gastroesophageal reflux disease)    with Barretts Esophagus  . Hyperlipidemia   . Hypertension   . Lumbago   . Macular degeneration   . Osteoporosis   . Sigmoid diverticulosis    by colonoscopy    HOSPITAL COURSE:  83 year old female with COPD and Barrett's esophagus who presents with non-ST elevation MI   1. Non-ST elevation MI with troponins trending down Troponin max 1.15   Patient was started on IV heparin, aspirin, Lipitor, metoprolol, statin She underwent cardiac catheterization  Ost LM lesion, 15 %stenosed.  Prox LAD lesion, 99 %stenosed.  Ost 1st Diag lesion, 99 %stenosed.  Mid RCA to Dist RCA lesion, 85 %stenosed.  Ost RPDA to RPDA lesion, 100 %stenosed.  The left ventricular ejection fraction is 50-55% by visual estimate.  There is mild left ventricular systolic dysfunction.  LV end diastolic pressure is normal.  There is no aortic valve stenosis.   Bifurcating proximal lad and d1 with 95-99% proxiimal lad and D1. Heavily calcified left main and proximal lad. 60-70% ostial lcx. Diffusely diseased rca with 100% pd with collaterals from  the left.  Preserved LV funciton Tortuous aorta.  Very high risk PCI lesions will need consideration for cabg.  She is being [facility] to [facility] for evaluation for CABG 2. Essential hypertension: Continue lisinopril and metoprolol  3. Depression: Continue Remeron  4. Barrett's esophagus: Continue PPI   DISCHARGE CONDITIONS AND DIET:   Cardiac diet  CONSULTS OBTAINED:  Treatment Team:  Teodoro Spray, MD Ivin Poot, MD  DRUG ALLERGIES:   Allergies  Allergen Reactions  . Morphine Sulfate Other (See Comments)    Reaction:  Hallucinations     DISCHARGE MEDICATIONS:   Current Discharge Medication List    START taking these medications   Details  aspirin EC 81 MG EC tablet Take 1 tablet (81 mg total) by mouth daily.    metoprolol tartrate (LOPRESSOR) 25 MG tablet Take 0.5 tablets (12.5 mg total) by mouth 2 (two) times daily. Qty: 60 tablet, Refills: 0      CONTINUE these medications which have CHANGED   Details  atorvastatin (LIPITOR) 40 MG tablet Take 1 tablet (40 mg total) by mouth daily at 6 PM. Qty: 30 tablet, Refills: 0      CONTINUE these medications which have NOT CHANGED   Details  albuterol (PROVENTIL HFA;VENTOLIN HFA) 108 (90 Base) MCG/ACT inhaler Inhale 2 puffs into the lungs every 6 (six) hours as needed for wheezing or shortness of breath. Qty: 1 Inhaler, Refills: 2    budesonide-formoterol (SYMBICORT) 80-4.5 MCG/ACT inhaler Inhale 2 puffs into the lungs  2 (two) times daily. Qty: 1 Inhaler, Refills: 12    diazepam (VALIUM) 5 MG tablet Take 0.5 tablets (2.5 mg total) by mouth 2 (two) times daily as needed. Qty: 30 tablet, Refills: 5    HYDROcodone-acetaminophen (NORCO/VICODIN) 5-325 MG tablet Take 1 tablet by mouth every 6 (six) hours as needed for moderate pain. Maximum 2 daily. Qty: 62 tablet, Refills: 0    lisinopril (PRINIVIL,ZESTRIL) 20 MG tablet TAKE 1 TABLET BY MOUTH EVERY DAY Qty: 90 tablet, Refills: 2    mirtazapine  (REMERON) 45 MG tablet Take 1 tablet (45 mg total) by mouth at bedtime. Qty: 90 tablet, Refills: 5    omeprazole (PRILOSEC) 40 MG capsule TAKE 1 CAPSULE BY MOUTH DAILY. Qty: 90 capsule, Refills: 0      STOP taking these medications     lovastatin (MEVACOR) 40 MG tablet      megestrol (MEGACE) 40 MG tablet      PARoxetine (PAXIL) 30 MG tablet           Today   CHIEF COMPLAINT:  No chest pain overnight  VITAL SIGNS:  Blood pressure (!) 123/44, pulse 65, temperature 98.9 F (37.2 C), temperature source Oral, resp. rate 17, height 5\' 5"  (1.651 m), weight 55.3 kg (122 lb), SpO2 97 %.   REVIEW OF SYSTEMS:  Review of Systems  Constitutional: Negative.  Negative for chills, fever and malaise/fatigue.  HENT: Negative.  Negative for ear discharge, ear pain, hearing loss, nosebleeds and sore throat.   Eyes: Negative.  Negative for blurred vision and pain.  Respiratory: Negative.  Negative for cough, hemoptysis, shortness of breath and wheezing.   Cardiovascular: Negative.  Negative for chest pain, palpitations and leg swelling.  Gastrointestinal: Negative.  Negative for abdominal pain, blood in stool, diarrhea, nausea and vomiting.  Genitourinary: Negative.  Negative for dysuria.  Musculoskeletal: Negative.  Negative for back pain.  Skin: Negative.   Neurological: Negative for dizziness, tremors, speech change, focal weakness, seizures and headaches.  Endo/Heme/Allergies: Negative.  Does not bruise/bleed easily.  Psychiatric/Behavioral: Negative.  Negative for depression, hallucinations and suicidal ideas.     PHYSICAL EXAMINATION:  GENERAL:  83 y.o.-year-old patient lying in the bed with no acute distress.  NECK:  Supple, no jugular venous distention. No thyroid enlargement, no tenderness.  LUNGS: Normal breath sounds bilaterally, no wheezing, rales,rhonchi  No use of accessory muscles of respiration.  CARDIOVASCULAR: S1, S2 normal. No murmurs, rubs, or gallops.  ABDOMEN:  Soft, non-tender, non-distended. Bowel sounds present. No organomegaly or mass.  EXTREMITIES: No pedal edema, cyanosis, or clubbing.  PSYCHIATRIC: The patient is alert and oriented x 3.  SKIN: No obvious rash, lesion, or ulcer.   DATA REVIEW:   CBC  Recent Labs Lab 08/28/16 0659  WBC 6.7  HGB 8.6*  HCT 25.7*  PLT 150    Chemistries   Recent Labs Lab 08/27/16 0452  NA 139  K 4.2  CL 110  CO2 23  GLUCOSE 101*  BUN 35*  CREATININE 1.12*  CALCIUM 9.1    Cardiac Enzymes  Recent Labs Lab 08/24/16 1453 08/24/16 2055 08/25/16 0321  TROPONINI 1.15* 0.91* 0.87*    Microbiology Results  @MICRORSLT48 @  RADIOLOGY:  No results found.    Current Discharge Medication List    START taking these medications   Details  aspirin EC 81 MG EC tablet Take 1 tablet (81 mg total) by mouth daily.    metoprolol tartrate (LOPRESSOR) 25 MG tablet Take 0.5 tablets (12.5 mg total) by  mouth 2 (two) times daily. Qty: 60 tablet, Refills: 0      CONTINUE these medications which have CHANGED   Details  atorvastatin (LIPITOR) 40 MG tablet Take 1 tablet (40 mg total) by mouth daily at 6 PM. Qty: 30 tablet, Refills: 0      CONTINUE these medications which have NOT CHANGED   Details  albuterol (PROVENTIL HFA;VENTOLIN HFA) 108 (90 Base) MCG/ACT inhaler Inhale 2 puffs into the lungs every 6 (six) hours as needed for wheezing or shortness of breath. Qty: 1 Inhaler, Refills: 2    budesonide-formoterol (SYMBICORT) 80-4.5 MCG/ACT inhaler Inhale 2 puffs into the lungs 2 (two) times daily. Qty: 1 Inhaler, Refills: 12    diazepam (VALIUM) 5 MG tablet Take 0.5 tablets (2.5 mg total) by mouth 2 (two) times daily as needed. Qty: 30 tablet, Refills: 5    HYDROcodone-acetaminophen (NORCO/VICODIN) 5-325 MG tablet Take 1 tablet by mouth every 6 (six) hours as needed for moderate pain. Maximum 2 daily. Qty: 62 tablet, Refills: 0    lisinopril (PRINIVIL,ZESTRIL) 20 MG tablet TAKE 1 TABLET BY  MOUTH EVERY DAY Qty: 90 tablet, Refills: 2    mirtazapine (REMERON) 45 MG tablet Take 1 tablet (45 mg total) by mouth at bedtime. Qty: 90 tablet, Refills: 5    omeprazole (PRILOSEC) 40 MG capsule TAKE 1 CAPSULE BY MOUTH DAILY. Qty: 90 capsule, Refills: 0      STOP taking these medications     lovastatin (MEVACOR) 40 MG tablet      megestrol (MEGACE) 40 MG tablet      PARoxetine (PAXIL) 30 MG tablet          A Management plans discussed with the patient and she is in agreement. Stable for discharge [place]  Patient should follow up with cardiology  CODE STATUS:     Code Status Orders        Start     Ordered   08/24/16 1813  Full code  Continuous     08/24/16 1813    Code Status History    Date Active Date Inactive Code Status Order ID Comments User Context   04/15/2015  1:59 AM 04/18/2015  5:49 PM Full Code [id number]  Lance Coon, MD Inpatient   04/09/2015  6:42 AM 04/10/2015  3:41 PM Full Code [id number]  Harrie Foreman, MD Inpatient   02/03/2015  7:51 AM 02/04/2015  2:33 PM DNR [id number]  Lance Coon, MD Inpatient    Advance Directive Documentation     Most Recent Value  Type of Advance Directive  Healthcare Power of Attorney  Pre-existing out of facility DNR order (yellow form or pink MOST form)  -  "MOST" Form in Place?  -      TOTAL TIME TAKING CARE OF THIS PATIENT: 37 minutes.    Note: This dictation was prepared with Dragon dictation along with smaller phrase technology. Any transcriptional errors that result from this process are unintentional.  Naara Kelty M.D on 08/28/2016 at 7:42 AM  Between 7am to 6pm - Pager - [phone] After 6pm go to [email or web address] - password [email or web address] [facility]  Office  [phone]  CC: Primary care physician; Crecencio Mc, MD

## 2016-08-28 NOTE — Progress Notes (Signed)
[facility] for heparin bolus and drip Indication: chest pain/ACS       Allergies  Allergen Reactions  . Morphine Sulfate Other (See Comments)    Reaction:  Hallucinations     Patient Measurements: Height: 5\' 5"  (165.1 cm) Weight: 122 lb (55.3 kg) IBW/kg (Calculated) : 57 Heparin Dosing Weight: 55.3 kg  Vital Signs: Temp: 97.6 F (36.4 C) (05/07 1448) Temp Source: Oral (05/07 1448) BP: 155/60 (05/07 1448) Pulse Rate: 68 (05/07 1448)  Labs:  Recent Labs (last 2 labs)    Recent Labs  08/24/16 2055  08/25/16 0321 08/25/16 1009  08/26/16 0423 08/26/16 1401 08/26/16 2218 08/27/16 0452  HGB  --   < > 8.6*  --   --   --  8.9*  --  9.2*  HCT  --   --   --   --   --   --  26.7*  --  27.8*  PLT  --   --   --   --   --   --  159  --  160  APTT  --   --   --  26  --   --   --   --   --   LABPROT  --   --   --  13.6  --   --   --   --   --   INR  --   --   --  1.04  --   --   --   --   --   HEPARINUNFRC  --   --   --   --   < > 0.21* 0.37 0.38 0.44  CREATININE  --   --  1.36*  --   --  1.23*  --   --  1.12*  TROPONINI 0.91*  --  0.87*  --   --   --   --   --   --   < > = values in this interval not displayed.    Estimated Creatinine Clearance: 33.8 mL/min (A) (by C-G formula based on SCr of 1.12 mg/dL (H)).  Assessment: 42 YOF started on heparin for ACS, now to continue post-cath for CABG plans. Transferred to [facility] from [facility] on 5/8.  Heparin level therapeutic this morning at 0.52 units/mL on 1050 units/hr.  Hgb 8.6, plts 150 this morning- no overt bleeding noted.  No anticoagulation PTA.   Goal of Therapy:  Heparin level 0.3-0.7 units/ml Monitor platelets by anticoagulation protocol: Yes   Plan:  -continue heparin 1050 units/hr -confirmatory heparin level at 1500 -daily heparin level and CBC -follow CABG plans  Jillian Warth D. Taiyana Kissler, PharmD, BCPS Clinical Pharmacist Pager: [phone] 08/28/2016 11:05 AM

## 2016-08-28 NOTE — Progress Notes (Signed)
Pt declined ambulating. Sts her back is really hurting from ambulance ride. She could not be encouraged, even by her daughter. She sts she only walks in the house due to her back. She uses a cane or RW. She is surprised that she needs OHS (sternotomy) as she was under the understanding that she could have CABG without a sternotomy. Family is surprised by this as well. They are all awaiting surgeon therefore we did not discuss preop education further. I left OHS booklet. Will f/u tomorrow. She would probably benefit from PT c/s to increase mobility as well. [place] CES, ACSM 2:41 PM 08/28/2016

## 2016-08-28 NOTE — Progress Notes (Signed)
[place] for heparin  Indication: chest pain/ACS       Allergies  Allergen Reactions  . Morphine Sulfate Other (See Comments)    Reaction:  Hallucinations     Patient Measurements: Height: 5\' 5"  (165.1 cm) Weight: 122 lb (55.3 kg) IBW/kg (Calculated) : 57 Heparin Dosing Weight: 55.3 kg  Vital Signs: Temp: 97.6 F (36.4 C) (05/07 1448) Temp Source: Oral (05/07 1448) BP: 155/60 (05/07 1448) Pulse Rate: 68 (05/07 1448)  Labs:  Recent Labs (last 2 labs)    Recent Labs  08/24/16 2055  08/25/16 0321 08/25/16 1009  08/26/16 0423 08/26/16 1401 08/26/16 2218 08/27/16 0452  HGB  --   < > 8.6*  --   --   --  8.9*  --  9.2*  HCT  --   --   --   --   --   --  26.7*  --  27.8*  PLT  --   --   --   --   --   --  159  --  160  APTT  --   --   --  26  --   --   --   --   --   LABPROT  --   --   --  13.6  --   --   --   --   --   INR  --   --   --  1.04  --   --   --   --   --   HEPARINUNFRC  --   --   --   --   < > 0.21* 0.37 0.38 0.44  CREATININE  --   --  1.36*  --   --  1.23*  --   --  1.12*  TROPONINI 0.91*  --  0.87*  --   --   --   --   --   --   < > = values in this interval not displayed.    Estimated Creatinine Clearance: 33.8 mL/min (A) (by C-G formula based on SCr of 1.12 mg/dL (H)).  Assessment: 69 YOF started on heparin for ACS, now to continue post-cath for possible CABG. -heparin level continues at goal  Goal of Therapy:  Heparin level 0.3-0.7 units/ml Monitor platelets by anticoagulation protocol: Yes   Plan:  -continue heparin 1050 units/hr -daily heparin level and CBC  Hildred Laser, Pharm D 08/28/2016 3:32 PM

## 2016-08-28 NOTE — H&P (Signed)
[address]       [place] [address]             [phone]        Kaelea R Amburn [place] Medical Record #[id number] Date of Birth: 21-Jan-1932  Referring: Dr. Ubaldo Glassing, MD Primary Care: Crecencio Mc, MD  Chief Complaint:  Chest pain Reason for transfer: Coronary artery disease that requires coronary artery bypass grafting surgery  History of Present Illness:     This is an 83 year old female with a past medical history of  tobacco abuse, hypertension, hyperlipidemia, aortic aneurysm (s/p repair July 10'), and COPD who initially presented to her primary care physician Dr. Derrel Nip pn 08/24/2016 for a 3 month check up. She mentined  to her doctor that she has had intermittent chest pain (sometimes at rest) for about 3 months. She thought it might be gas or GERD. Marland Kitchen An EKG was done and showed a new Q wave in III,  ST depression, and TWI in V3 and V4 . According to the patient, she was then admitted to [facility] on 08/24/2016. She denied palpitations, syncope, diaphoresis, nausea or emesis. EKG showed sinus rhythm, anteroseptal infarct (age undetermined), ST depression, precordial T-wave abnormality, and Aanterior ischemia. Initial Troponin was 1.15 and has subsequently decreased. She was also found to have hypertensive urgency as her blood pressure was 193/160. She was started on Lopressor, Lisinopril, and IV Hydralazine PRN. Dr. Ubaldo Glassing was consulted. He performed a cardiac catheterization on 08/27/2016. Results showed LVEF 50-55%, proximal LAD 99%, ostial 1st diagonal 99% stenosed, mid to distal RCA 85% stenosed, and ostial RPDA 100 5 stenosed. Dr. Prescott Gum has accepted the patient in transfer to [facility] for coronary artery bypass grafting surgery. Of note, he operated on the patient's daughter Steva Ready so patient is requesting him.  Currently, she denies chest pain or shortness of breath. Her vital signs are she is afebrile, HR in the 60's , BP  105/64, RR 20, and oxygenation 98% on room air.  Current Activity/ Functional Status: Patient is independent with mobility/ambulation, transfers, ADL's, IADL's.   Zubrod Score: At the time of surgery this patient's most appropriate activity status/level should be described as: []     0    Normal activity, no symptoms [x]     1    Restricted in physical strenuous activity but ambulatory, able to do out light work []     2    Ambulatory and capable of self care, unable to do work activities, up and about                 more than 50%  Of the time                            []     3    Only limited self care, in bed greater than 50% of waking hours []     4    Completely disabled, no self care, confined to bed or chair []     5    Moribund  Past Medical History:  Diagnosis Date  . Anemia 2012   of acute blood loss, resolved  . Anxiety   . Aortic aneurysm ([place])   . Arthritis   . Barrett's esophagus 2007  . Bursitis 2012   left hip, improved with periodic steroid injection [facility] [place])  . COPD (chronic obstructive pulmonary disease) (  [place])   . Depression   . GERD (gastroesophageal reflux disease)    with Barretts Esophagus  . Hyperlipidemia   . Hypertension   . Lumbago   . Macular degeneration   . Osteoporosis   . Sigmoid diverticulosis    by colonoscopy    Past Surgical History:  Procedure Laterality Date  . ABDOMINAL AORTIC ANEURYSM REPAIR  July 2010   [facility]  . APPENDECTOMY    . BACK SURGERY     X2..1975 arachnoid cyst cervical region([place]);1997 lumbosacral tumor,9 hr surgery  . LEFT HEART CATH AND CORONARY ANGIOGRAPHY N/A 08/27/2016   Procedure: Left Heart Cath and Coronary Angiography;  Surgeon: Teodoro Spray, MD;  Location: [facility];  Service: Cardiovascular;  Laterality: N/A;   Social History   Social History  . Marital status: Widowed    Spouse name: N/A  . Number of children: N/A  . Years of education: N/A    Social History Main Topics  .  Smoking status: Current Every Day Smoker    Packs/day: 0.50    Types: Cigarettes  . Smokeless tobacco: Never Used     Comment: HAS BEEN SMOKING 30+ YEARS,RESUMED TOBACCO USE AFTER AAA REPAIR  . Alcohol use No  . Drug use: No  . Sexual activity: Not on file    Allergies  Allergen Reactions  . Morphine Sulfate Other (See Comments)    Reaction:  Hallucinations     Prescriptions Prior to Admission  Medication Sig Dispense Refill Last Dose  . albuterol (PROVENTIL HFA;VENTOLIN HFA) 108 (90 Base) MCG/ACT inhaler Inhale 2 puffs into the lungs every 6 (six) hours as needed for wheezing or shortness of breath. 1 Inhaler 2 prn at prn  . aspirin EC 81 MG EC tablet Take 1 tablet (81 mg total) by mouth daily.     Marland Kitchen atorvastatin (LIPITOR) 40 MG tablet Take 1 tablet (40 mg total) by mouth daily at 6 PM. 30 tablet 0   . budesonide-formoterol (SYMBICORT) 80-4.5 MCG/ACT inhaler Inhale 2 puffs into the lungs 2 (two) times daily. 1 Inhaler 12 08/24/2016 at am  . diazepam (VALIUM) 5 MG tablet Take 0.5 tablets (2.5 mg total) by mouth 2 (two) times daily as needed. 30 tablet 5 prn at prn  . HYDROcodone-acetaminophen (NORCO/VICODIN) 5-325 MG tablet Take 1 tablet by mouth every 6 (six) hours as needed for moderate pain. Maximum 2 daily. 62 tablet 0 08/24/2016 at am  . lisinopril (PRINIVIL,ZESTRIL) 20 MG tablet TAKE 1 TABLET BY MOUTH EVERY DAY 90 tablet 2 08/24/2016 at am  . metoprolol tartrate (LOPRESSOR) 25 MG tablet Take 0.5 tablets (12.5 mg total) by mouth 2 (two) times daily. 60 tablet 0   . mirtazapine (REMERON) 45 MG tablet Take 1 tablet (45 mg total) by mouth at bedtime. 90 tablet 5 08/23/2016 at qhs  . omeprazole (PRILOSEC) 40 MG capsule TAKE 1 CAPSULE BY MOUTH DAILY. 90 capsule 0 08/24/2016 at am    Family History  Problem Relation Age of Onset  . Coronary artery disease, Alzheimers Mother   . Coronary artery disease, massive MI Father 85  . Cancer Neg Hx     no breast,coon or ovarian ca  Review of Systems:      Cardiac Review of Systems: Y or N  Chest Pain [ Y   ]  Resting SOB [  N ] Exertional SOB  [ N ]  Orthopnea [ N ]   Pedal Edema [ N  ]    Palpitations [  Y ] Syncope  [ N ]   Presyncope [ N  ]  General Review of Systems: [Y] = yes [  ]=no Constitional: recent weight change [ Y-loss ]; nausea [ N ]; night sweats [ N ]; fever [ N ]; or chills [  N]                                                                 Eye : blurred vision Aqua.Slicker  ]; diplopia [ Y-has macular degenertion  ]; Resp: cough [ N ];  wheezing[N  ];  hemoptysis[  N];  GI:  vomiting[ N ];  dysphagia[ N ]; melena[N  ];  hematochezia [  N]; heartburn[ Y ] GU:  hematuria[ N ];   dysuria [ N ];               Skin: rash, swelling[ N ];, hair loss[N  ];  peripheral edema[N  ];  or itching[N  ]; Musculosketetal: myalgias[ N ]; joint pain[Y  ];  back pain[ Y ];  Heme/Lymph: bruising[N  ];  bleeding[N  ];  anemia[ Y ];  Neuro: Sharlene.Ates  ];   stroke[N  ];  vertigo[N  ];  seizures[  N];   paresthesias[  ];  difficulty walking[  ];  Psych:depression[ Y ]; anxiety[Y  ];  Endocrine: diabetes[ N ];  thyroid dysfunction[N  ];   Physical Exam: BP 105/64 (BP Location: Right Arm)   Pulse 61   Temp 98.4 F (36.9 C) (Oral)   Resp 20   Ht 5\' 5"  (1.651 m)   Wt 52.7 kg (116 lb 1.6 oz)   SpO2 98%   BMI 19.32 kg/m   General appearance: alert, cooperative and no distress Head: Normocephalic, without obvious abnormality, atraumatic Neck: no carotid bruit, no JVD and supple, symmetrical, trachea midline Resp: clear to auscultation bilaterally Cardio: RRR, no murmur GI: Soft, non tender, bowel sounds present Extremities: No LE edema;palpable DP bilaterally Neurologic: Grossly normal  Diagnostic Studies & Laboratory data:    Left Heart Cath and Coronary Angiography by Dr. Ubaldo Glassing on 08/27/2016:  Conclusion     Ost LM lesion, 15 %stenosed.  Prox LAD lesion, 99 %stenosed.  Ost 1st Diag lesion, 99 %stenosed.  Mid RCA to Dist RCA lesion, 85  %stenosed.  Ost RPDA to RPDA lesion, 100 %stenosed.  The left ventricular ejection fraction is 50-55% by visual estimate.  There is mild left ventricular systolic dysfunction.  LV end diastolic pressure is normal.  There is no aortic valve stenosis.   Bifurcating proximal lad and d1 with 95-99% proxiimal lad and D1. Heavily calcified left main and proximal lad. 60-70% ostial lcx. Diffusely diseased rca with 100% pd with collaterals from the left.  Preserved LV funciton Tortuous aorta.  Very high risk PCI lesions will need consideration for cabg.      Recent Radiology Findings:    CLINICAL DATA:  Abnormal electrocardiogram.  Chest pain  EXAM: CHEST  2 VIEW  COMPARISON:  November 11, 2015  FINDINGS: There is no edema or consolidation. Heart size and pulmonary vascularity are within normal limits. There is aortic atherosclerosis. There is left anterior descending coronary artery calcification. No adenopathy. There is thoracolumbar levoscoliosis.  IMPRESSION: Aortic atherosclerosis. Left anterior descending coronary artery  calcification. Scoliosis. No edema or consolidation.   Electronically Signed   By: Lowella Grip III M.D.   On: 08/24/2016 15:22  I have independently reviewed the above radiologic studies.  Recent Lab Findings: Lab Results  Component Value Date   WBC 6.7 08/28/2016   HGB 8.6 (L) 08/28/2016   HCT 25.7 (L) 08/28/2016   PLT 150 08/28/2016   GLUCOSE 107 (H) 08/28/2016   CHOL 134 08/25/2016   TRIG 65 08/25/2016   HDL 47 08/25/2016   LDLDIRECT 94.0 11/25/2014   LDLCALC 74 08/25/2016   ALT 10 03/28/2016   AST 15 03/28/2016   NA 137 08/28/2016   K 4.2 08/28/2016   CL 112 (H) 08/28/2016   CREATININE 1.24 (H) 08/28/2016   BUN 31 (H) 08/28/2016   CO2 20 (L) 08/28/2016   TSH 0.44 11/04/2015   INR 1.04 08/25/2016   Assessment / Plan:   1.Unstable angina, and s/p NSTEMI-on Heparin drip. Has multivessel CAD and will require coronary artery  bypass grafting surgery by Dr. Prescott Gum. Continue pre op work up (carotid duplex, PFTs) 2. Hypertension-on Lopressor 12.5 mg bid and Lisinopril 2.5 mg both with parameters 3. Hyperlipidemia-on Lipitor 40 mg at hs 4. COPD-on Symbicort prior to admission but Dulera substituted here 5. Tobacco abuse-will discuss with patient about using Nicotine patch after surgery 6. Chronic back and hip pain-takes Vicodin as needed for pain   I  spent 20 minutes counseling the patient face to face and 50% or more the  time was spent in counseling and coordination of care. The total time spent in the appointment was 60 minutes.   Lars Pinks PA-C 08/28/2016 9:32 AM  Patient examined, coronary angiograms personally reviewed as well as echocardiogram Very  nice 83 year old female active smoker with hypertension and positive family history of CAD-CABG is referred for consideration of coronary bypass grafting after being recently diagnosed with significant 2 vessel CAD. She has occasional symptoms of angina both with exertion and at rest. She underwent catheterization at [facility] regional which demonstrated high-grade proximal 90% LAD stenosis at a bifurcation with the diagonal. There also is significant disease of the RCA. LV function is normal. The patient is currently stable on IV heparin.  After long discussion of the patient's situation she has made it clear she does not wish sternotomy-CABG. She misunderstood what bypass surgery entailed prior to being transferred here. She would be interested in discussing PCI or stenting the LAD and I will ask our interventional cardiologist here to review her coronary angiogram and discuss possible intervention with this very nice elderly lady. We will continue IV heparin Tharon Aquas trigt M.D.

## 2016-08-28 NOTE — Progress Notes (Signed)
       [facility]  SUBJECTIVE: No further chest pain. Femoral cath site intact   Vitals:   08/27/16 1925 08/27/16 1926 08/27/16 2008 08/28/16 0530  BP: (!) 74/29 (!) 84/46 (!) 155/50 (!) 123/44  Pulse: 73 72 69 65  Resp: 18   17  Temp: 98.4 F (36.9 C)   98.9 F (37.2 C)  TempSrc: Oral   Oral  SpO2: 98%   97%  Weight:      Height:        Intake/Output Summary (Last 24 hours) at 08/28/16 9562 Last data filed at 08/27/16 1836  Gross per 24 hour  Intake            249.5 ml  Output              350 ml  Net           -100.5 ml    LABS: Basic Metabolic Panel:  Recent Labs  08/26/16 0423 08/27/16 0452  NA 136 139  K 3.9 4.2  CL 110 110  CO2 22 23  GLUCOSE 95 101*  BUN 32* 35*  CREATININE 1.23* 1.12*  CALCIUM 8.7* 9.1   Liver Function Tests: No results for input(s): AST, ALT, ALKPHOS, BILITOT, PROT, ALBUMIN in the last 72 hours. No results for input(s): LIPASE, AMYLASE in the last 72 hours. CBC:  Recent Labs  08/26/16 1401 08/27/16 0452  WBC 4.3 4.1  HGB 8.9* 9.2*  HCT 26.7* 27.8*  MCV 88.7 88.3  PLT 159 160   Cardiac Enzymes: No results for input(s): CKTOTAL, CKMB, CKMBINDEX, TROPONINI in the last 72 hours. BNP: Invalid input(s): POCBNP D-Dimer: No results for input(s): DDIMER in the last 72 hours. Hemoglobin A1C: No results for input(s): HGBA1C in the last 72 hours. Fasting Lipid Panel: No results for input(s): CHOL, HDL, LDLCALC, TRIG, CHOLHDL, LDLDIRECT in the last 72 hours. Thyroid Function Tests: No results for input(s): TSH, T4TOTAL, T3FREE, THYROIDAB in the last 72 hours.  Invalid input(s): FREET3 Anemia Panel: No results for input(s): VITAMINB12, FOLATE, FERRITIN, TIBC, IRON, RETICCTPCT in the last 72 hours.   Physical Exam: Blood pressure (!) 123/44, pulse 65, temperature 98.9 F (37.2 C), temperature source Oral, resp. rate 17, height 5\' 5"  (1.651 m), weight 55.3 kg (122 lb), SpO2 97 %.    Wt Readings from Last 1 Encounters:  08/27/16 55.3 kg (122 lb)     General appearance: alert and cooperative Resp: clear to auscultation bilaterally Cardio: regular rate and rhythm GI: soft, non-tender; bowel sounds normal; no masses,  no organomegaly Pulses: 2+ and symmetric Neurologic: Grossly normal  TELEMETRY: Reviewed telemetry pt in nsr:  ASSESSMENT AND PLAN:  Active Problems:   NSTEMI (non-ST elevated myocardial infarction) (HCC)-stable post cardiac cath. [facility].     Teodoro Spray, MD, [facility] 08/28/2016 7:02 AM

## 2016-08-28 NOTE — Progress Notes (Signed)
Patient A&O in room in distress at this time. Daughter and patient notified of transport. Report given to [facility] for transport to [facility]. Attempted to call report to [facility] but received no answer. [facility] arrived to transport patient to [facility]. Medical necessity and EMTALA completed and placed in packet. If needed receieving RN can call me at [phone].

## 2016-08-29 ENCOUNTER — Other Ambulatory Visit (HOSPITAL_COMMUNITY): Payer: Medicare Other

## 2016-08-29 DIAGNOSIS — I214 Non-ST elevation (NSTEMI) myocardial infarction: Secondary | ICD-10-CM

## 2016-08-29 LAB — COMPREHENSIVE METABOLIC PANEL
ALT: 15 U/L (ref 14–54)
AST: 19 U/L (ref 15–41)
Albumin: 3 g/dL — ABNORMAL LOW (ref 3.5–5.0)
Alkaline Phosphatase: 53 U/L (ref 38–126)
Anion gap: 8 (ref 5–15)
BILIRUBIN TOTAL: 0.3 mg/dL (ref 0.3–1.2)
BUN: 28 mg/dL — AB (ref 6–20)
CHLORIDE: 110 mmol/L (ref 101–111)
CO2: 20 mmol/L — ABNORMAL LOW (ref 22–32)
CREATININE: 1.36 mg/dL — AB (ref 0.44–1.00)
Calcium: 8.8 mg/dL — ABNORMAL LOW (ref 8.9–10.3)
GFR, EST AFRICAN AMERICAN: 41 mL/min — AB (ref >60)
GFR, EST NON AFRICAN AMERICAN: 35 mL/min — AB (ref >60)
Glucose, Bld: 130 mg/dL — ABNORMAL HIGH (ref 65–99)
POTASSIUM: 4.1 mmol/L (ref 3.5–5.1)
Sodium: 138 mmol/L (ref 135–145)
TOTAL PROTEIN: 5.4 g/dL — AB (ref 6.5–8.1)

## 2016-08-29 LAB — CBC
HCT: 24.1 % — ABNORMAL LOW (ref 36.0–46.0)
Hemoglobin: 7.6 g/dL — ABNORMAL LOW (ref 12.0–15.0)
MCH: 28.6 pg (ref 26.0–34.0)
MCHC: 31.5 g/dL (ref 30.0–36.0)
MCV: 90.6 fL (ref 78.0–100.0)
Platelets: 143 10*3/uL — ABNORMAL LOW (ref 150–400)
RBC: 2.66 MIL/uL — ABNORMAL LOW (ref 3.87–5.11)
RDW: 13.7 % (ref 11.5–15.5)
WBC: 6.6 10*3/uL (ref 4.0–10.5)

## 2016-08-29 LAB — HEPARIN LEVEL (UNFRACTIONATED)
Heparin Unfractionated: 0.29 IU/mL — ABNORMAL LOW (ref 0.30–0.70)
Heparin Unfractionated: 0.17 IU/mL — ABNORMAL LOW (ref 0.30–0.70)
Heparin Unfractionated: 0.51 IU/mL (ref 0.30–0.70)

## 2016-08-29 MED ORDER — FERROUS SULFATE 325 (65 FE) MG PO TABS
325.0000 mg | ORAL_TABLET | Freq: Every day | ORAL | Status: DC
  Administered 2016-08-30 – 2016-09-02 (×4): 325 mg via ORAL
  Filled 2016-08-29 (×4): qty 1

## 2016-08-29 MED ORDER — CLOPIDOGREL BISULFATE 75 MG PO TABS
300.0000 mg | ORAL_TABLET | Freq: Once | ORAL | Status: AC
  Administered 2016-08-29: 300 mg via ORAL
  Filled 2016-08-29: qty 4

## 2016-08-29 MED ORDER — ASPIRIN EC 81 MG PO TBEC
81.0000 mg | DELAYED_RELEASE_TABLET | Freq: Every day | ORAL | Status: DC
  Administered 2016-08-30 – 2016-09-02 (×4): 81 mg via ORAL
  Filled 2016-08-29 (×4): qty 1

## 2016-08-29 MED ORDER — CLOPIDOGREL BISULFATE 75 MG PO TABS
75.0000 mg | ORAL_TABLET | Freq: Every day | ORAL | Status: DC
  Administered 2016-08-30: 75 mg via ORAL
  Filled 2016-08-29: qty 1

## 2016-08-29 NOTE — Progress Notes (Addendum)
      [address]       [place] [address]             [phone]        Procedure(s) (LRB): CORONARY ARTERY BYPASS GRAFTING (CABG) (N/A) TRANSESOPHAGEAL ECHOCARDIOGRAM (TEE) (N/A) Subjective: No c/o, specifically denies SOB or chest pain  Objective: Vital signs in last 24 hours: Temp:  [98.3 F (36.8 C)-99.4 F (37.4 C)] 99.4 F (37.4 C) (05/09 0438) Pulse Rate:  [61-111] 70 (05/09 0438) Cardiac Rhythm: Normal sinus rhythm (05/08 2030) Resp:  [19-20] 20 (05/09 0438) BP: (105-119)/(34-64) 105/38 (05/09 0438) SpO2:  [95 %-99 %] 99 % (05/09 0438) Weight:  [116 lb 1.6 oz (52.7 kg)-117 lb 8.1 oz (53.3 kg)] 117 lb 8.1 oz (53.3 kg) (05/09 0441)  Hemodynamic parameters for last 24 hours:    Intake/Output from previous day: 05/08 0701 - 05/09 0700 In: 395.1 [P.O.:240; I.V.:155.1] Out: 650 [Urine:650] Intake/Output this shift: No intake/output data recorded.  General appearance: alert, cooperative and no distress Heart: regular rate and rhythm Lungs: clear to auscultation bilaterally Abdomen: benign Extremities: no edema  Lab Results:  Recent Labs  08/28/16 0659 08/29/16 0320  WBC 6.7 6.6  HGB 8.6* 7.6*  HCT 25.7* 24.1*  PLT 150 143*   BMET:  Recent Labs  08/28/16 0659 08/29/16 0320  NA 137 138  K 4.2 4.1  CL 112* 110  CO2 20* 20*  GLUCOSE 107* 130*  BUN 31* 28*  CREATININE 1.24* 1.36*  CALCIUM 8.8* 8.8*    PT/INR: No results for input(s): LABPROT, INR in the last 72 hours. ABG No results found for: PHART, HCO3, TCO2, ACIDBASEDEF, O2SAT CBG (last 3)  No results for input(s): GLUCAP in the last 72 hours.  Meds Scheduled Meds: . aspirin EC  325 mg Oral Daily  . atorvastatin  40 mg Oral q1800  . lisinopril  2.5 mg Oral Daily  . megestrol  40 mg Oral Daily  . metoprolol tartrate  12.5 mg Oral BID  . mirtazapine  45 mg Oral QHS  . mometasone-formoterol  2 puff Inhalation BID  . pantoprazole  40 mg Oral Daily  . PARoxetine  30 mg  Oral Daily  . sodium chloride flush  3 mL Intravenous Q12H   Continuous Infusions: . sodium chloride    . heparin 1,050 Units/hr (08/29/16 0205)   PRN Meds:.sodium chloride, acetaminophen, diazepam, HYDROcodone-acetaminophen, magnesium hydroxide, nitroGLYCERIN, ondansetron (ZOFRAN) IV, sodium chloride flush  Xrays No results found.  Assessment/Plan: S/P Procedure(s) (LRB): CORONARY ARTERY BYPASS GRAFTING (CABG) (N/A) TRANSESOPHAGEAL ECHOCARDIOGRAM (TEE) (N/A)  1 clinically stable 2 on heparin, HGB dropping will guiac stool- has h/o Barrett's and diverticulosis, h/o previous GI bleeding 3 plans for interventional cardiol. to give opinion on intervention   LOS: 1 day    GOLD,WAYNE E 08/29/2016 Continue heparin Appreciate invasive cardiology assessment for pci per patient wish patient examined and medical record reviewed,agree with above note. Tharon Aquas Trigt III 08/29/2016

## 2016-08-29 NOTE — Progress Notes (Signed)
[place] for heparin  Indication: chest pain/ACS       Allergies  Allergen Reactions  . Morphine Sulfate Other (See Comments)    Reaction:  Hallucinations    Patient Measurements: Height: 5\' 5"  (165.1 cm) Weight: 122 lb (55.3 kg) IBW/kg (Calculated) : 57 Heparin Dosing Weight: 55.3 kg  Vital Signs: Temp: 97.6 F (36.4 C) (05/07 1448) Temp Source: Oral (05/07 1448) BP: 155/60 (05/07 1448) Pulse Rate: 68 (05/07 1448)  Labs:  Recent Labs (last 2 labs)    Recent Labs  08/24/16 2055  08/25/16 0321 08/25/16 1009  08/26/16 0423 08/26/16 1401 08/26/16 2218 08/27/16 0452  HGB  --   < > 8.6*  --   --   --  8.9*  --  9.2*  HCT  --   --   --   --   --   --  26.7*  --  27.8*  PLT  --   --   --   --   --   --  159  --  160  APTT  --   --   --  26  --   --   --   --   --   LABPROT  --   --   --  13.6  --   --   --   --   --   INR  --   --   --  1.04  --   --   --   --   --   HEPARINUNFRC  --   --   --   --   < > 0.21* 0.37 0.38 0.44  CREATININE  --   --  1.36*  --   --  1.23*  --   --  1.12*  TROPONINI 0.91*  --  0.87*  --   --   --   --   --   --   < > = values in this interval not displayed.   Estimated Creatinine Clearance: 33.8 mL/min (A) (by C-G formula based on SCr of 1.12 mg/dL (H)).  Assessment: 62 YOF started on heparin for ACS, now to continue post-cath for possible CABG. Per Dr. Lucianne Lei Trigt's note, the patient is no longer interested in CABG and would like to pursue other options. HgB trending down 7.6 this morning, PLT 143. No bleeding reported by RN, however patient did pull out IV this morning around 0200. IV has been restarted. Will get another heparin level this afternoon for confirmation.  Heparin level: 0.29   Goal of Therapy:  Heparin level 0.3-0.7 units/ml Monitor platelets by anticoagulation protocol: Yes   Plan:  -Continue heparin gtt to 1050 units/hr  -Daily heparin level and CBC -Monitor for s/sx  of bleeding  Georga Bora, PharmD Clinical Pharmacist 08/29/2016 5:02 AM

## 2016-08-29 NOTE — Progress Notes (Signed)
ANTICOAGULATION CONSULT NOTE - Follow Up Consult  Pharmacy Consult for Heparin Indication: chest pain/ACS  Allergies  Allergen Reactions  . Morphine Sulfate Other (See Comments)    Reaction:  Hallucinations     Patient Measurements: Height: 5\' 5"  (165.1 cm) Weight: 117 lb 8.1 oz (53.3 kg) IBW/kg (Calculated) : 57 Heparin Dosing Weight: 53.3 kg  Vital Signs: Temp: 98.7 F (37.1 C) (05/09 2049) Temp Source: Oral (05/09 2049) BP: 117/37 (05/09 2049) Pulse Rate: 67 (05/09 2049)  Labs:  Recent Labs  08/27/16 0452  08/28/16 0659  08/29/16 0320 08/29/16 1117 08/29/16 2005  HGB 9.2*  --  8.6*  --  7.6*  --   --   HCT 27.8*  --  25.7*  --  24.1*  --   --   PLT 160  --  150  --  143*  --   --   HEPARINUNFRC 0.44  < > 0.52  < > 0.29* 0.17* 0.51  CREATININE 1.12*  --  1.24*  --  1.36*  --   --   < > = values in this interval not displayed.  Estimated Creatinine Clearance: 26.8 mL/min (A) (by C-G formula based on SCr of 1.36 mg/dL (H)).   Assessment: 80 YOF started on heparin for ACS, now to continue post-cath for possible CABG. Per Dr. Lucianne Lei Trigt's note, the patient is no longer interested in CABG and would like to pursue other options for high risk PCI.  Heparin level this evening is in range at 0.51 units/mL after a rate increase earlier today. No bleeding noted.  Goal of Therapy:  Heparin level 0.3-0.7 units/ml Monitor platelets by anticoagulation protocol: Yes   Plan:  Continue heparin at 1250 units/hr Daily heparin level and CBC Monitor s/sx of bleeding and patient's decision on intervention  Mose Colaizzi D. Dimas Scheck, PharmD, BCPS Clinical Pharmacist Pager: [phone] 08/29/2016 9:08 PM

## 2016-08-29 NOTE — Consult Note (Signed)
CARDIOLOGY CONSULT NOTE   Patient ID: Anna Prince MRN: [id number] DOB/AGE: Mar 15, 1932 83 y.o.  Admit date: 08/28/2016  Requesting Physician: Dr. Nils Pyle  Primary Physician:   Crecencio Mc, MD Primary Cardiologist:   Dr Ubaldo Glassing Reason for Consultation:  PCI options  Anna Prince is a 83 y.o. female who is being seen today for the evaluation of CAD PCI options at the request of Dr Lucianne Lei Tright.   HPI: Anna Prince is a 83 y.o. female with a history of ongoing tobacco abuse, HTN, HLD, CKD, aortic aneurysm s/p repair (2010) and COPD who was transferred to [facility] from [facility] on 08/28/16 for CTCS consult. Patient refusing bypass surgery, so cardiology consulted to discuss PCI options.   She saw her PCP, Dr. Derrel Nip on 08/24/16 for 3 month follow up. She complained of intermittent chest pain that occurred at rest x 3 months. It felt like indigestion. ECG in the office showed new Q waves in lead II, ST depression and TWI in V3-V4. She was admitted to [facility] that day. Troponin was abnormal at 1.15--> 0.91--> 0.87. Her BP was elevated to 193/160 c/w hypertensive urgency. This improved with Lopressor, Lisinopril and Hydralazine. Dr Ubaldo Glassing with cardiology was consulted. He performed a cardiac catheterization on 08/27/2016. Results showed LVEF 50-55%, proximal LAD 99%, ostial 1st diagonal 99% stenosed, mid to distal RCA 85% stenosed, and ostial RPDA 100 % stenosed.  She was transferred to [facility] for TCTS consult. Dr. Nils Pyle saw her today and patient is not interested in surgery and would like to know high risk PCI options.  She is resting comfortably in bed. She has one short episode of chest pain this AM while laying in bed. Lasted seconds. She is feeling fine currently. No SOB. No LE edema, orthopnea or PND. No dizziness or syncope. No blood in stool or urine. No palpitations. Her daughter had sternotomy and had a very difficult recovery. She says she is too old for surgery and even if PCI is  not an option for her, she will no do surgery. She says chest pain does not bother her that much and occurs intermittently. Sometimes she goes weeks without chest pain and it is not necessarily related to exertion.     Past Medical History:  Diagnosis Date  . Anemia 2012   of acute blood loss, resolved  . Anxiety   . Aortic aneurysm ([place])   . Arthritis   . Barrett's esophagus 2007  . Bursitis 2012   left hip, improved with periodic steroid injection [facility] [place])  . COPD (chronic obstructive pulmonary disease) ([place])   . Depression   . GERD (gastroesophageal reflux disease)    with Barretts Esophagus  . Hyperlipidemia   . Hypertension   . Lumbago   . Macular degeneration   . Osteoporosis   . Sigmoid diverticulosis    by colonoscopy     Past Surgical History:  Procedure Laterality Date  . ABDOMINAL AORTIC ANEURYSM REPAIR  July 2010   [facility]  . APPENDECTOMY    . BACK SURGERY     X2..1975 arachnoid cyst cervical region([place]);1997 lumbosacral tumor,9 hr surgery  . LEFT HEART CATH AND CORONARY ANGIOGRAPHY N/A 08/27/2016   Procedure: Left Heart Cath and Coronary Angiography;  Surgeon: Teodoro Spray, MD;  Location: [place] [facility];  Service: Cardiovascular;  Laterality: N/A;    Allergies  Allergen Reactions  . Morphine Sulfate Other (See Comments)    Reaction:  Hallucinations     I have reviewed the patient's current medications . aspirin EC  325 mg Oral Daily  . atorvastatin  40 mg Oral q1800  . lisinopril  2.5 mg Oral Daily  . megestrol  40 mg Oral Daily  . metoprolol tartrate  12.5 mg Oral BID  . mirtazapine  45 mg Oral QHS  . mometasone-formoterol  2 puff Inhalation BID  . pantoprazole  40 mg Oral Daily  . PARoxetine  30 mg Oral Daily  . sodium chloride flush  3 mL Intravenous Q12H   . sodium chloride    . heparin 1,050 Units/hr (08/29/16 0205)   sodium chloride, acetaminophen, diazepam, HYDROcodone-acetaminophen, magnesium hydroxide,  nitroGLYCERIN, ondansetron (ZOFRAN) IV, sodium chloride flush  Prior to Admission medications   Medication Sig Start Date End Date Taking? Authorizing Provider  albuterol (PROVENTIL HFA;VENTOLIN HFA) 108 (90 Base) MCG/ACT inhaler Inhale 2 puffs into the lungs every 6 (six) hours as needed for wheezing or shortness of breath. 11/11/15   Alfred Levins, [place], MD  aspirin EC 81 MG EC tablet Take 1 tablet (81 mg total) by mouth daily. 08/28/16   Bettey Costa, MD  atorvastatin (LIPITOR) 40 MG tablet Take 1 tablet (40 mg total) by mouth daily at 6 PM. 08/27/16   Bettey Costa, MD  budesonide-formoterol (SYMBICORT) 80-4.5 MCG/ACT inhaler Inhale 2 puffs into the lungs 2 (two) times daily. 05/17/16   Crecencio Mc, MD  diazepam (VALIUM) 5 MG tablet Take 0.5 tablets (2.5 mg total) by mouth 2 (two) times daily as needed. 04/09/16   Crecencio Mc, MD  HYDROcodone-acetaminophen (NORCO/VICODIN) 5-325 MG tablet Take 1 tablet by mouth every 6 (six) hours as needed for moderate pain. Maximum 2 daily. 08/24/16   Crecencio Mc, MD  lisinopril (PRINIVIL,ZESTRIL) 20 MG tablet TAKE 1 TABLET BY MOUTH EVERY DAY 10/27/15   Crecencio Mc, MD  metoprolol tartrate (LOPRESSOR) 25 MG tablet Take 0.5 tablets (12.5 mg total) by mouth 2 (two) times daily. 08/27/16   Bettey Costa, MD  mirtazapine (REMERON) 45 MG tablet Take 1 tablet (45 mg total) by mouth at bedtime. 04/09/16   Crecencio Mc, MD  omeprazole (PRILOSEC) 40 MG capsule TAKE 1 CAPSULE BY MOUTH DAILY. 08/07/16   Crecencio Mc, MD     Social History   Social History  . Marital status: Widowed    Spouse name: N/A  . Number of children: N/A  . Years of education: N/A   Occupational History  . Not on file.   Social History Main Topics  . Smoking status: Current Every Day Smoker    Packs/day: 0.50    Types: Cigarettes  . Smokeless tobacco: Never Used     Comment: HAS BEEN SMOKING 30+ YEARS,RESUMED TOBACCO USE AFTER AAA REPAIR  . Alcohol use No  . Drug use: No  .  Sexual activity: Not on file   Other Topics Concern  . Not on file   Social History Narrative  . No narrative on file    Family Status  Relation Status  . Mother Deceased  . Father Deceased  . Neg Hx    Family History  Problem Relation Age of Onset  . Coronary artery disease Mother   . Coronary artery disease Father 31  . Cancer Neg Hx     no breast,coon or ovarian ca   ROS:  Full 14 point review of systems complete and found to be negative unless listed above.  Physical Exam: Blood pressure (!) 105/35,  pulse 62, temperature 99.4 F (37.4 C), temperature source Oral, resp. rate 20, height 5\' 5"  (1.651 m), weight 117 lb 8.1 oz (53.3 kg), SpO2 98 %.  General: Well developed, well nourished, female in no acute distress Head: Eyes PERRLA, No xanthomas.   Normocephalic and atraumatic, oropharynx without edema or exudate. Dentition:  Lungs: CTAB Heart: HRRR S1 S2, no rub/gallop, Heart regular rate and rhythm with S1, S2  No murmur. pulses are 2+ extrem.   Neck: No carotid bruits. No lymphadenopathy.  No JVD. Abdomen: Bowel sounds present, abdomen soft and non-tender without masses or hernias noted. Msk:  No spine or cva tenderness. No weakness, no joint deformities or effusions. Extremities: No clubbing or cyanosis. No LE edema.  Neuro: Alert and oriented X 3. No focal deficits noted. Psych:  Good affect, responds appropriately Skin: No rashes or lesions noted.  Labs:   Lab Results  Component Value Date   WBC 6.6 08/29/2016   HGB 7.6 (L) 08/29/2016   HCT 24.1 (L) 08/29/2016   MCV 90.6 08/29/2016   PLT 143 (L) 08/29/2016   No results for input(s): INR in the last 72 hours.   Recent Labs Lab 08/29/16 0320  NA 138  K 4.1  CL 110  CO2 20*  BUN 28*  CREATININE 1.36*  CALCIUM 8.8*  PROT 5.4*  BILITOT 0.3  ALKPHOS 53  ALT 15  AST 19  GLUCOSE 130*  ALBUMIN 3.0*   Magnesium  Date Value Ref Range Status  05/03/2015 1.7 1.7 - 2.4 mg/dL Final   No results for  input(s): CKTOTAL, CKMB, TROPONINI in the last 72 hours. No results for input(s): TROPIPOC in the last 72 hours. No results found for: PROBNP Lab Results  Component Value Date   CHOL 134 08/25/2016   HDL 47 08/25/2016   LDLCALC 74 08/25/2016   TRIG 65 08/25/2016   No results found for: DDIMER Lipase  Date/Time Value Ref Range Status  04/14/2015 09:27 PM 31 11 - 51 U/L Final   TSH  Date/Time Value Ref Range Status  11/04/2015 09:12 AM 0.44 0.35 - 4.50 uIU/mL Final   Vitamin B-12  Date/Time Value Ref Range Status  06/10/2015 11:55 AM 266 211 - 911 pg/mL Final   Ferritin  Date/Time Value Ref Range Status  06/10/2015 11:55 AM 30.9 10.0 - 291.0 ng/mL Final   TIBC  Date/Time Value Ref Range Status  11/25/2014 11:28 AM 309 250 - 470 ug/dL Final   Iron  Date/Time Value Ref Range Status  06/10/2015 11:55 AM 62 42 - 145 ug/dL Final    Echo:: 08/25/2016 LV EF: 50% -   55% Study Conclusions - Left ventricle: Wall thickness was increased in a pattern of mild   LVH. Systolic function was normal. The estimated ejection   fraction was in the range of 50% to 55%. Doppler parameters are   consistent with abnormal left ventricular relaxation (grade 1   diastolic dysfunction). - Aortic valve: Valve area (Vmax): 1.86 cm^2. - Mitral valve: There was mild regurgitation. - Right atrium: The atrium was mildly dilated.   Cath 08/27/16 Left Heart Cath and Coronary Angiography  Conclusion     Ost LM lesion, 15 %stenosed.  Prox LAD lesion, 99 %stenosed.  Ost 1st Diag lesion, 99 %stenosed.  Mid RCA to Dist RCA lesion, 85 %stenosed.  Ost RPDA to RPDA lesion, 100 %stenosed.  The left ventricular ejection fraction is 50-55% by visual estimate.  There is mild left ventricular systolic dysfunction.  LV end  diastolic pressure is normal.  There is no aortic valve stenosis.   Bifurcating proximal lad and d1 with 95-99% proxiimal lad and D1. Heavily calcified left main and proximal  lad. 60-70% ostial lcx. Diffusely diseased rca with 100% pd with collaterals from the left.  Preserved LV funciton Tortuous aorta.  Very high risk PCI lesions will need consideration for cabg.      ECG:  NSR with PACs, LVH, Q waves in lead III and anterior TWIs- personally reviewed   Radiology:  No results found.  ASSESSMENT AND PLAN:    Active Problems:   Hypertension   Aortic aneurysm, abdominal (HCC)   COPD (chronic obstructive pulmonary disease) (HCC)   GERD (gastroesophageal reflux disease)   Coronary artery disease  CAD/NSTEMI: troponin 1.15--> 0.91--> 0.87. LHC 08/27/2016 showed LVEF 50-55%, proximal LAD 99%, ostial 1st diagonal 99% stenosed, mid to distal RCA 85% stenosed, and ostial RPDA 100 % stenosed. She was transferred here to discuss bypass surgery with Dr. Prescott Gum, but patient refusing CABG. She would like to know her PCI options. Dr. Burt Knack will review films and make final decision on whether she has any PCI options. Patient is clear that if PCI is not an option, she still does not want to pursue surgery as the chest pain does not "bother her that much." In that case, we should try to optimize her antianginal regimen  Hypertensive urgency: BP initially 193/160. Now improved. BP soft to normal on lisinopril 2.5 mg daily and Lopressor 12.5mg  BID  COPD with ongoing tobacco abuse: counseled on the importance of quitting   HLD: continue statin   CKD: creat stable.   Aortic aneurysm s/p repair  Signed: Angelena Form, PA-C 08/29/2016 11:21 AM  Pager [phone]  Co-Sign MD  Patient seen, examined. Available data reviewed. Agree with findings, assessment, and plan as outlined by Angelena Form, PA-C. Exam: Vitals:   08/29/16 1108 08/29/16 2049  BP: (!) 105/35 (!) 117/37  Pulse: 62 67  Resp:  17  Temp:  98.7 F (37.1 C)   Pt is alert and oriented, pleasant elderly woman in NAD HEENT: normal Neck: JVP - normal Lungs: CTA bilaterally CV: RRR without murmur  or gallop Abd: soft, NT, Positive BS, no hepatomegaly Ext: no C/C/E, distal pulses intact and equal, right groin site clear Skin: warm/dry no rash  Cath films reviewed. Pt has declined CABG after careful consideration. I spoke with her daughter at length over the telephone and discussed treatment options and plan with her. She has a CTO of the RCA and high-grade calcific bifurcational disease of the proximal LAD/first diagonal.we reviewed treatment options of medical therapy versus PCI. I think PCI of the proximal LAD with atherectomy is a reasonable treatment option for this patient who is still functional and desires a less invasive treatment to CABG. She understands the risk of the procedure is considerably increased because of the complexity of her CAD, need for atherectomy, and presence of bifurcation disease. Her femoral arteries are heavily calcified and I do not she will be a good candidate for hemodynamic support. LV function is relatively preserved. The patient has become increasingly anemic without signs of active bleeding. Will discuss PRBC transfusion with her in the morning and consider PCI on Friday. I have reviewed the risks, indications, and alternatives to cardiac catheterization, atherectomy, angioplasty, and stenting with the patient. Risks include but are not limited to bleeding, infection, vascular injury, stroke, myocardial infection, arrhythmia, kidney injury, radiation-related injury in the case of prolonged fluoroscopy  use, and death. She would not be a candidate for emergency CABG, nor would she desire this even in a life threatening situation.   Plan:  Load with plavix  Reduce ASA 81 mg daily  Start ferrous sulfate  Transfuse PRBC's tomorrow  PCI Friday  Sherren Mocha, M.D. 08/29/2016 10:28 PM

## 2016-08-29 NOTE — Progress Notes (Signed)
ANTICOAGULATION CONSULT NOTE - Follow Up Consult  Pharmacy Consult for Heparin Indication: chest pain/ACS  Allergies  Allergen Reactions  . Morphine Sulfate Other (See Comments)    Reaction:  Hallucinations     Patient Measurements: Height: 5\' 5"  (165.1 cm) Weight: 117 lb 8.1 oz (53.3 kg) IBW/kg (Calculated) : 57 Heparin Dosing Weight: 53.3 kg  Vital Signs: Temp: 99.4 F (37.4 C) (05/09 0438) Temp Source: Oral (05/09 0438) BP: 105/35 (05/09 1108) Pulse Rate: 62 (05/09 1108)  Labs:  Recent Labs  08/27/16 0452  08/28/16 0659 08/28/16 1453 08/29/16 0320 08/29/16 1117  HGB 9.2*  --  8.6*  --  7.6*  --   HCT 27.8*  --  25.7*  --  24.1*  --   PLT 160  --  150  --  143*  --   HEPARINUNFRC 0.44  < > 0.52 0.41 0.29* 0.17*  CREATININE 1.12*  --  1.24*  --  1.36*  --   < > = values in this interval not displayed.  Estimated Creatinine Clearance: 26.8 mL/min (A) (by C-G formula based on SCr of 1.36 mg/dL (H)).   Medications:  Scheduled:  . aspirin EC  325 mg Oral Daily  . atorvastatin  40 mg Oral q1800  . lisinopril  2.5 mg Oral Daily  . megestrol  40 mg Oral Daily  . metoprolol tartrate  12.5 mg Oral BID  . mirtazapine  45 mg Oral QHS  . mometasone-formoterol  2 puff Inhalation BID  . pantoprazole  40 mg Oral Daily  . PARoxetine  30 mg Oral Daily  . sodium chloride flush  3 mL Intravenous Q12H   Infusions:  . sodium chloride    . heparin 1,050 Units/hr (08/29/16 0205)    Assessment: 39 YOF started on heparin for ACS, now to continue post-cath for possible CABG. Per Dr. Lucianne Lei Trigt's note, the patient is no longer interested in CABG and would like to pursue other options. HgB trending down 7.6 this morning, PLT 143. No bleeding reported by RN, however patient did pull out IV this morning around 0200. IV has been restarted.  Repeat HL is subtherapeutic at 0.17 on heparin 1050 units/hr. Nurse reports no further issues with infusion or bleeding.  Goal of Therapy:   Heparin level 0.3-0.7 units/ml Monitor platelets by anticoagulation protocol: Yes   Plan:  Increase heparin to 1250 units/hr 8h HL Daily HL/CBC Monitor s/sx of bleeding  Andrey Cota. Diona Foley, PharmD, BCPS Clinical Pharmacist [phone] 08/29/2016,1:10 PM

## 2016-08-30 ENCOUNTER — Encounter (HOSPITAL_COMMUNITY): Payer: Self-pay | Admitting: General Practice

## 2016-08-30 ENCOUNTER — Encounter (HOSPITAL_COMMUNITY): Payer: Medicare Other

## 2016-08-30 LAB — HEMOGLOBIN A1C
HEMOGLOBIN A1C: 5.6 % (ref 4.8–5.6)
MEAN PLASMA GLUCOSE: 114 mg/dL

## 2016-08-30 LAB — IRON AND TIBC
IRON: 24 ug/dL — AB (ref 28–170)
SATURATION RATIOS: 7 % — AB (ref 10.4–31.8)
TIBC: 329 ug/dL (ref 250–450)
UIBC: 305 ug/dL

## 2016-08-30 LAB — CBC
HCT: 24.2 % — ABNORMAL LOW (ref 36.0–46.0)
HEMOGLOBIN: 7.6 g/dL — AB (ref 12.0–15.0)
MCH: 28.6 pg (ref 26.0–34.0)
MCHC: 31.4 g/dL (ref 30.0–36.0)
MCV: 91 fL (ref 78.0–100.0)
PLATELETS: 150 10*3/uL (ref 150–400)
RBC: 2.66 MIL/uL — ABNORMAL LOW (ref 3.87–5.11)
RDW: 13.9 % (ref 11.5–15.5)
WBC: 5.6 10*3/uL (ref 4.0–10.5)

## 2016-08-30 LAB — FERRITIN: Ferritin: 41 ng/mL (ref 11–307)

## 2016-08-30 LAB — ABO/RH: ABO/RH(D): O POS

## 2016-08-30 LAB — HEPARIN LEVEL (UNFRACTIONATED): Heparin Unfractionated: 0.56 IU/mL (ref 0.30–0.70)

## 2016-08-30 LAB — PREPARE RBC (CROSSMATCH)

## 2016-08-30 MED ORDER — SODIUM CHLORIDE 0.9 % IV SOLN
Freq: Once | INTRAVENOUS | Status: AC
  Administered 2016-08-30: 15:00:00 via INTRAVENOUS

## 2016-08-30 MED ORDER — SODIUM CHLORIDE 0.9 % IV SOLN: 250.0000 mL | INTRAVENOUS | Status: DC | PRN

## 2016-08-30 MED ORDER — SODIUM CHLORIDE 0.9 % WEIGHT BASED INFUSION
3.0000 mL/kg/h | INTRAVENOUS | Status: DC
  Administered 2016-08-31: 3 mL/kg/h via INTRAVENOUS

## 2016-08-30 MED ORDER — SODIUM CHLORIDE 0.9 % WEIGHT BASED INFUSION
1.0000 mL/kg/h | INTRAVENOUS | Status: DC
  Administered 2016-08-31: 1 mL/kg/h via INTRAVENOUS

## 2016-08-30 MED ORDER — SODIUM CHLORIDE 0.9% FLUSH: 3.0000 mL | INTRAVENOUS | Status: DC | PRN

## 2016-08-30 MED ORDER — SODIUM CHLORIDE 0.9% FLUSH
3.0000 mL | Freq: Two times a day (BID) | INTRAVENOUS | Status: DC
  Administered 2016-08-30: 3 mL via INTRAVENOUS

## 2016-08-30 NOTE — Progress Notes (Signed)
ANTICOAGULATION CONSULT NOTE - Follow Up Consult  Pharmacy Consult for Heparin Indication: CAD  Allergies  Allergen Reactions  . Morphine Sulfate Other (See Comments)    Reaction:  Hallucinations     Patient Measurements: Height: 5\' 5"  (165.1 cm) Weight: 118 lb 3.2 oz (53.6 kg) IBW/kg (Calculated) : 57 Heparin Dosing Weight:    Vital Signs: Temp: 99.1 F (37.3 C) (05/10 0535) Temp Source: Oral (05/10 0535) BP: 105/43 (05/10 1022) Pulse Rate: 61 (05/10 1022)  Labs:  Recent Labs  08/28/16 0659  08/29/16 0320 08/29/16 1117 08/29/16 2005 08/30/16 0302  HGB 8.6*  --  7.6*  --   --  7.6*  HCT 25.7*  --  24.1*  --   --  24.2*  PLT 150  --  143*  --   --  150  HEPARINUNFRC 0.52  < > 0.29* 0.17* 0.51 0.56  CREATININE 1.24*  --  1.36*  --   --   --   < > = values in this interval not displayed.  Estimated Creatinine Clearance: 27 mL/min (A) (by C-G formula based on SCr of 1.36 mg/dL (H)).   Assessment:  Anticoag: started on heparin for ACS and then continues after cath for CABG plans. Pt refuses CABG. HL 0.56. Hgb remains low at 7.6. Plan PCI (not today due to low Hgb). Transfuse 1 unit   Goal of Therapy:  Heparin level 0.3-0.7 units/ml Monitor platelets by anticoagulation protocol: Yes   Plan:  PCI 5/11 (atherectomy and stenting of the LAD) Continue IV heparin at 1250 units/hr Daily HL/CBC Transfuse 1 unit PRBC.   Kayshaun Polanco S. Alford Highland, PharmD, BCPS Clinical Staff Pharmacist Pager [phone]  Eilene Ghazi Stillinger 08/30/2016,11:31 AM

## 2016-08-30 NOTE — Progress Notes (Addendum)
Progress Note  Patient Name: Anna Prince Date of Encounter: 08/30/2016  Primary Cardiologist: Dr. Ubaldo Glassing  Subjective   Patient is feeling well overall this morning but admits she did not sleep well. She has declined CABG and was scheduled for PCI for today. However, her hemoglobin has dropped to 7.6 this morning. She denies any symptoms of GI bleed. See's her PCP every 3 months and has blood drawn every 6 months, no explanation of her anemia from PCP.  She is a long time smoker on chronic steroids. Discussed transfusion.  Inpatient Medications    Scheduled Meds: . aspirin EC  81 mg Oral Daily  . atorvastatin  40 mg Oral q1800  . clopidogrel  75 mg Oral Daily  . ferrous sulfate  325 mg Oral Q breakfast  . lisinopril  2.5 mg Oral Daily  . megestrol  40 mg Oral Daily  . metoprolol tartrate  12.5 mg Oral BID  . mirtazapine  45 mg Oral QHS  . mometasone-formoterol  2 puff Inhalation BID  . pantoprazole  40 mg Oral Daily  . PARoxetine  30 mg Oral Daily  . sodium chloride flush  3 mL Intravenous Q12H   Continuous Infusions: . sodium chloride    . heparin 1,250 Units/hr (08/29/16 1313)   PRN Meds: sodium chloride, acetaminophen, diazepam, HYDROcodone-acetaminophen, magnesium hydroxide, nitroGLYCERIN, ondansetron (ZOFRAN) IV, sodium chloride flush   Vital Signs    Vitals:   08/29/16 1947 08/29/16 2049 08/30/16 0220 08/30/16 0535  BP:  (!) 117/37  (!) 118/33  Pulse:  67  69  Resp:  17  18  Temp:  98.7 F (37.1 C)  99.1 F (37.3 C)  TempSrc:  Oral  Oral  SpO2: 96% 98%  98%  Weight:   119 lb 8 oz (54.2 kg) 118 lb 3.2 oz (53.6 kg)  Height:       No intake or output data in the 24 hours ending 08/30/16 0920 Filed Weights   08/29/16 0441 08/30/16 0220 08/30/16 0535  Weight: 117 lb 8.1 oz (53.3 kg) 119 lb 8 oz (54.2 kg) 118 lb 3.2 oz (53.6 kg)    Telemetry    HR 60-70's Sinus rhythm, occasional premature complexes.- Personally Reviewed  ECG    HR 60, Sinus rhythm  with premature atrial complexes, LVH, T-wave - Personally Reviewed  Physical Exam   GEN: Cachetic, well developed HEENT: normal  Neck: no JVD, carotid bruits, or masses Cardiac: RRR. , rubs, or gallops,no edema. Intact distal pulses bilaterally. +Systolic murmur Respiratory: clear to auscultation bilaterally, normal work of breathing GI: soft, nontender, nondistended, + BS MS: no deformity or atrophy  Skin: warm and dry, no rash, + pale Neuro: Alert and Oriented x 3, Strength and sensation are intact Psych:   Full affect  Labs    Chemistry Recent Labs Lab 08/27/16 0452 08/28/16 0659 08/29/16 0320  NA 139 137 138  K 4.2 4.2 4.1  CL 110 112* 110  CO2 23 20* 20*  GLUCOSE 101* 107* 130*  BUN 35* 31* 28*  CREATININE 1.12* 1.24* 1.36*  CALCIUM 9.1 8.8* 8.8*  PROT  --   --  5.4*  ALBUMIN  --   --  3.0*  AST  --   --  19  ALT  --   --  15  ALKPHOS  --   --  53  BILITOT  --   --  0.3  GFRNONAA 44* 39* 35*  GFRAA 52* 46* 41*  ANIONGAP 6  5 8     Hematology Recent Labs Lab 08/28/16 0659 08/29/16 0320 08/30/16 0302  WBC 6.7 6.6 5.6  RBC 2.91* 2.66* 2.66*  HGB 8.6* 7.6* 7.6*  HCT 25.7* 24.1* 24.2*  MCV 88.3 90.6 91.0  MCH 29.5 28.6 28.6  MCHC 33.4 31.5 31.4  RDW 13.7 13.7 13.9  PLT 150 143* 150    Cardiac Enzymes Recent Labs Lab 08/24/16 1453 08/24/16 2055 08/25/16 0321  TROPONINI 1.15* 0.91* 0.87*    Radiology    No results found.  Cardiac Studies   Left Heart Cath and Coronary Angiography 08/27/2016   Conclusion     Ost LM lesion, 15 %stenosed.  Prox LAD lesion, 99 %stenosed.  Ost 1st Diag lesion, 99 %stenosed.  Mid RCA to Dist RCA lesion, 85 %stenosed.  Ost RPDA to RPDA lesion, 100 %stenosed.  The left ventricular ejection fraction is 50-55% by visual estimate.  There is mild left ventricular systolic dysfunction.  LV end diastolic pressure is normal.  There is no aortic valve stenosis.   Bifurcating proximal lad and d1 with  95-99% proxiimal lad and D1. Heavily calcified left main and proximal lad. 60-70% ostial lcx. Diffusely diseased rca with 100% pd with collaterals from the left.  Preserved LV funciton Tortuous aorta.  Very high risk PCI lesions will need consideration for cabg.    Transthoracic Echocardiography 08/25/2016  Study Conclusions  - Left ventricle: Wall thickness was increased in a pattern of mild   LVH. Systolic function was normal. The estimated ejection   fraction was in the range of 50% to 55%. Doppler parameters are   consistent with abnormal left ventricular relaxation (grade 1   diastolic dysfunction). - Aortic valve: Valve area (Vmax): 1.86 cm^2. - Mitral valve: There was mild regurgitation. - Right atrium: The atrium was mildly dilated.   Patient Profile     83 y.o. female with a history of ongoing tobacco abuse, HTN, HLD, CKD, aortic aneurysm s/p repair (2010) and COPD who was transferred to [facility] from [facility] on 08/28/16 for CTCS consult. Patient refusing bypass surgery, so cardiology consulted to discuss PCI options.   Assessment & Plan    CAD/NSTEMI: Pt has declined CABG, will go for PCI when stable. Troponin 1.15--> 0.91--> 0.87. LHC 08/27/2016 showed LVEF 50-55%, proximal LAD 99%, ostial 1st diagonal 99% stenosed, mid to distal RCA 85% stenosed, and ostial RPDA 100 % stenosed. This morning her hemoglobin is low at 7.6 and therefore cardiac catheterization will be postponed.  Severe Anemia; Pt is asymptomatic from her anemia. Unknown etiology, doubt GI as she denies rectal bleeding or having any episodes of dark stool. She has been on longer term steroids, is a long term smoker, has been having difficulty keeping her weight up. If hemoccult negative, consider HemeOnc referral. Plan is to discuss 1 unit of PRBC pt is agreeable to this. Type and Screen ordered.   Hypertensive Urgency: BP is low at 118/33,  On Lisinopril 2.5 mg daily and Lopressor 12.5 mg BID.  COPD with ongoing  tobacco abuse: Pt has decided based on age and life expectancy that she is not interested in quitting.  HLD: cont statin  CKD: creat stable  s/p aortic aneurysm repair.  Today's Weight: 118 lb Admission Weight:  116 lb Blood Pressure: 118/33  Hr: 68 Today's Labs:  WBC 5.6, Hbg 7.6   Signed, GREENE,TIFFANY G, PA-C  08/30/2016, 9:20 AM    Patient seen, examined. Available data reviewed. Agree with findings, assessment, and plan as outlined  by Delos Haring, PA-C.   My Exam today: Vitals:   08/30/16 1000 08/30/16 1022  BP: (!) 105/43 (!) 105/43  Pulse:  61  Resp:    Temp:     Pt is alert and oriented, Pleasant elderly woman in NAD HEENT: normal Neck: JVP - normal Lungs: CTA bilaterally CV: RRR with 2/6 systolic ejection murmur at the left sternal border Abd: soft, NT, Positive BS, no hepatomegaly Ext: no C/C/E, distal pulses intact and equal Skin: warm/dry no rash  I agree with the documentation above. The patient's hemoglobin has trended down without signs of active bleeding. Will add iron studies to her blood work. Will transfuse 1 unit of packed red blood cells today. The patient denies any change in her bowel habits and specifically denies melena or hematochezia. She would not be a candidate for colonoscopy or any other aggressive evaluation because of her severe coronary artery disease. We will check her stools, but I think the most prudent thing to do is move forward with PCI of her proximal LAD. She understands the complexity of her coronary artery disease and high risk features for PCI as discussed in yesterday's consultation note. She has been started on Plavix. Will plan on atherectomy and stenting of the LAD. Her case has been discussed with Dr. Saunders Revel who will do her procedure tomorrow. The patient has stage III chronic kidney disease. Will stop her lisinopril to reduce the risk of periprocedural acute kidney injury. Otherwise as outlined above.  Sherren Mocha,  M.D. 08/30/2016 10:32 AM

## 2016-08-30 NOTE — Consult Note (Signed)
   [facility] CM Inpatient Consult   08/30/2016  Anna Prince 04-03-1932 [id number]    Patient screened for [facility] Management services. Went to bedside to offer and explain [facility] Care Management program with Anna Prince. She pleasantly declines [facility] Care Management program.  States " my daughter runs a home health agency, she takes care of everything for me".   Patient denies having transition of care, care coordination, disease management, disease monitoring, transportation, community resource, or pharmacy needs at this time. Declined [facility] Care Management follow up.   Accepted [facility] Care Management brochure with contact information to call in future if changes mind. Made inpatient RNCM aware that Anna Prince declined [facility] Management program services.  Marthenia Rolling, MSN-Ed, RN,BSN [facility] Liaison [phone]

## 2016-08-31 ENCOUNTER — Encounter (HOSPITAL_COMMUNITY)
Admission: AD | Disposition: A | Discharge status: Home / Self Care | Payer: Self-pay | Source: Other Acute Inpatient Hospital | Attending: Cardiovascular Disease

## 2016-08-31 ENCOUNTER — Inpatient Hospital Stay (HOSPITAL_COMMUNITY): Admit: 2016-08-31 | Payer: Medicare Other | Admitting: Cardiothoracic Surgery

## 2016-08-31 DIAGNOSIS — I10 Essential (primary) hypertension: Secondary | ICD-10-CM

## 2016-08-31 DIAGNOSIS — E785 Hyperlipidemia, unspecified: Secondary | ICD-10-CM

## 2016-08-31 DIAGNOSIS — I2510 Atherosclerotic heart disease of native coronary artery without angina pectoris: Secondary | ICD-10-CM

## 2016-08-31 DIAGNOSIS — D509 Iron deficiency anemia, unspecified: Secondary | ICD-10-CM

## 2016-08-31 HISTORY — PX: CORONARY ATHERECTOMY: CATH118238

## 2016-08-31 HISTORY — PX: INTRAVASCULAR ULTRASOUND/IVUS: CATH118244

## 2016-08-31 LAB — POCT ACTIVATED CLOTTING TIME
Activated Clotting Time: 208 seconds
Activated Clotting Time: 235 seconds
Activated Clotting Time: 257 seconds
Activated Clotting Time: 257 seconds
Activated Clotting Time: 263 seconds
Activated Clotting Time: 257 seconds

## 2016-08-31 LAB — OCCULT BLOOD X 1 CARD TO LAB, STOOL: Fecal Occult Bld: POSITIVE — AB

## 2016-08-31 LAB — CBC
HCT: 27.4 % — ABNORMAL LOW (ref 36.0–46.0)
Hemoglobin: 8.6 g/dL — ABNORMAL LOW (ref 12.0–15.0)
MCH: 28 pg (ref 26.0–34.0)
MCHC: 31.4 g/dL (ref 30.0–36.0)
MCV: 89.3 fL (ref 78.0–100.0)
PLATELETS: 142 10*3/uL — AB (ref 150–400)
RBC: 3.07 MIL/uL — ABNORMAL LOW (ref 3.87–5.11)
RDW: 14.6 % (ref 11.5–15.5)
WBC: 6 10*3/uL (ref 4.0–10.5)

## 2016-08-31 LAB — BASIC METABOLIC PANEL
ANION GAP: 9 (ref 5–15)
BUN: 27 mg/dL — ABNORMAL HIGH (ref 6–20)
CALCIUM: 9 mg/dL (ref 8.9–10.3)
CHLORIDE: 110 mmol/L (ref 101–111)
CO2: 19 mmol/L — AB (ref 22–32)
CREATININE: 1.36 mg/dL — AB (ref 0.44–1.00)
GFR calc Af Amer: 41 mL/min — ABNORMAL LOW (ref >60)
GFR calc non Af Amer: 35 mL/min — ABNORMAL LOW (ref >60)
GLUCOSE: 99 mg/dL (ref 65–99)
Potassium: 4.2 mmol/L (ref 3.5–5.1)
Sodium: 138 mmol/L (ref 135–145)

## 2016-08-31 LAB — PROTIME-INR
INR: 1.09
Prothrombin Time: 14.2 seconds (ref 11.4–15.2)

## 2016-08-31 LAB — HEPARIN LEVEL (UNFRACTIONATED): Heparin Unfractionated: 0.67 IU/mL (ref 0.30–0.70)

## 2016-08-31 SURGERY — CORONARY ARTERY BYPASS GRAFTING (CABG)
Anesthesia: General | Site: Chest

## 2016-08-31 SURGERY — CORONARY ATHERECTOMY
Anesthesia: LOCAL

## 2016-08-31 MED ORDER — HEPARIN SODIUM (PORCINE) 1000 UNIT/ML IJ SOLN
INTRAMUSCULAR | Status: AC
  Filled 2016-08-31: qty 1

## 2016-08-31 MED ORDER — LIDOCAINE HCL (PF) 1 % IJ SOLN
INTRAMUSCULAR | Status: DC | PRN
  Administered 2016-08-31: 1 mL

## 2016-08-31 MED ORDER — SODIUM CHLORIDE 0.9 % IV SOLN: INTRAVENOUS | Status: AC

## 2016-08-31 MED ORDER — VERAPAMIL HCL 2.5 MG/ML IV SOLN
INTRAVENOUS | Status: DC | PRN
  Administered 2016-08-31: 10 mL via INTRA_ARTERIAL

## 2016-08-31 MED ORDER — IOPAMIDOL (ISOVUE-370) INJECTION 76%
INTRAVENOUS | Status: AC
  Filled 2016-08-31: qty 50

## 2016-08-31 MED ORDER — IOPAMIDOL (ISOVUE-370) INJECTION 76%
INTRAVENOUS | Status: AC
  Filled 2016-08-31: qty 125

## 2016-08-31 MED ORDER — HEPARIN SODIUM (PORCINE) 1000 UNIT/ML IJ SOLN
INTRAMUSCULAR | Status: DC | PRN
  Administered 2016-08-31: 5000 [IU] via INTRAVENOUS
  Administered 2016-08-31: 2000 [IU] via INTRAVENOUS
  Administered 2016-08-31 (×3): 3000 [IU] via INTRAVENOUS

## 2016-08-31 MED ORDER — NITROGLYCERIN 1 MG/10 ML FOR IR/CATH LAB
INTRA_ARTERIAL | Status: AC
  Filled 2016-08-31: qty 10

## 2016-08-31 MED ORDER — SODIUM CHLORIDE 0.9% FLUSH
3.0000 mL | Freq: Two times a day (BID) | INTRAVENOUS | Status: DC
  Administered 2016-08-31 – 2016-09-01 (×2): 3 mL via INTRAVENOUS

## 2016-08-31 MED ORDER — DOCUSATE SODIUM 100 MG PO CAPS: 100.0000 mg | ORAL_CAPSULE | Freq: Two times a day (BID) | ORAL | Status: DC | PRN

## 2016-08-31 MED ORDER — NITROGLYCERIN 1 MG/10 ML FOR IR/CATH LAB
INTRA_ARTERIAL | Status: DC | PRN
  Administered 2016-08-31: 200 ug via INTRACORONARY
  Administered 2016-08-31: 100 ug via INTRACORONARY
  Administered 2016-08-31: 200 ug via INTRACORONARY

## 2016-08-31 MED ORDER — LABETALOL HCL 5 MG/ML IV SOLN: 10.0000 mg | INTRAVENOUS | Status: AC | PRN

## 2016-08-31 MED ORDER — CLOPIDOGREL BISULFATE 300 MG PO TABS
ORAL_TABLET | ORAL | Status: DC | PRN
  Administered 2016-08-31: 300 mg via ORAL

## 2016-08-31 MED ORDER — FENTANYL CITRATE (PF) 100 MCG/2ML IJ SOLN
INTRAMUSCULAR | Status: DC | PRN
  Administered 2016-08-31: 25 ug via INTRAVENOUS

## 2016-08-31 MED ORDER — LIDOCAINE HCL 1 % IJ SOLN
INTRAMUSCULAR | Status: AC
  Filled 2016-08-31: qty 20

## 2016-08-31 MED ORDER — VERAPAMIL HCL 2.5 MG/ML IV SOLN
INTRAVENOUS | Status: AC
  Filled 2016-08-31: qty 2

## 2016-08-31 MED ORDER — CLOPIDOGREL BISULFATE 75 MG PO TABS
75.0000 mg | ORAL_TABLET | Freq: Every day | ORAL | Status: DC
  Administered 2016-09-01 – 2016-09-02 (×2): 75 mg via ORAL
  Filled 2016-08-31 (×2): qty 1

## 2016-08-31 MED ORDER — CLOPIDOGREL BISULFATE 300 MG PO TABS
ORAL_TABLET | ORAL | Status: AC
  Filled 2016-08-31: qty 1

## 2016-08-31 MED ORDER — MIDAZOLAM HCL 2 MG/2ML IJ SOLN
INTRAMUSCULAR | Status: AC
  Filled 2016-08-31: qty 2

## 2016-08-31 MED ORDER — FENTANYL CITRATE (PF) 100 MCG/2ML IJ SOLN
INTRAMUSCULAR | Status: AC
  Filled 2016-08-31: qty 2

## 2016-08-31 MED ORDER — SODIUM CHLORIDE 0.9% FLUSH: 3.0000 mL | INTRAVENOUS | Status: DC | PRN

## 2016-08-31 MED ORDER — HYDRALAZINE HCL 20 MG/ML IJ SOLN: 5.0000 mg | INTRAMUSCULAR | Status: AC | PRN

## 2016-08-31 MED ORDER — SODIUM CHLORIDE 0.9 % IV SOLN: 250.0000 mL | INTRAVENOUS | Status: DC | PRN

## 2016-08-31 MED ORDER — MIDAZOLAM HCL 2 MG/2ML IJ SOLN
INTRAMUSCULAR | Status: DC | PRN
  Administered 2016-08-31: 0.5 mg via INTRAVENOUS

## 2016-08-31 MED ORDER — HEPARIN SODIUM (PORCINE) 5000 UNIT/ML IJ SOLN
5000.0000 [IU] | Freq: Three times a day (TID) | INTRAMUSCULAR | Status: DC
  Administered 2016-09-01: 5000 [IU] via SUBCUTANEOUS
  Filled 2016-08-31: qty 1

## 2016-08-31 MED ORDER — HEPARIN (PORCINE) IN NACL 2-0.9 UNIT/ML-% IJ SOLN
INTRAMUSCULAR | Status: AC | PRN
  Administered 2016-08-31: 1000 mL

## 2016-08-31 MED ORDER — SODIUM CHLORIDE 0.9 % IV SOLN
INTRAVENOUS | Status: DC | PRN
  Administered 2016-08-31: 20 mL/h via INTRAVENOUS

## 2016-08-31 SURGICAL SUPPLY — 32 items
BALLN EMERGE MR 2.75X12 (BALLOONS) ×2
BALLN ~~LOC~~ EMERGE MR 3.5X15 (BALLOONS) ×2
BALLOON EMERGE MR 2.75X12 (BALLOONS) ×1 IMPLANT
BALLOON ~~LOC~~ EMERGE MR 3.5X15 (BALLOONS) ×1 IMPLANT
CATH LAUNCHER 6FR EBU3.5 (CATHETERS) ×2 IMPLANT
CATH OPTICROSS 40MHZ (CATHETERS) ×2 IMPLANT
CATH TURNPIKE LP 150CM (CATHETERS) ×2 IMPLANT
CATH VISTA GUIDE 6FR XB3 (CATHETERS) ×2 IMPLANT
CROWN DIAMONDBACK CLASSIC 1.25 (BURR) ×2 IMPLANT
DEVICE RAD COMP TR BAND LRG (VASCULAR PRODUCTS) ×2 IMPLANT
ELECT DEFIB PAD ADLT CADENCE (PAD) ×2 IMPLANT
GLIDESHEATH SLEND A-KIT 6F 22G (SHEATH) ×2 IMPLANT
GLIDESHEATH SLEND SS 6F .021 (SHEATH) ×2 IMPLANT
GUIDEWIRE ANGLED .035X150CM (WIRE) ×2 IMPLANT
GUIDEWIRE INQWIRE 1.5J.035X260 (WIRE) ×1 IMPLANT
INQWIRE 1.5J .035X260CM (WIRE) ×2
KIT ENCORE 26 ADVANTAGE (KITS) ×2 IMPLANT
KIT HEART LEFT (KITS) ×2 IMPLANT
KIT MICROINTRODUCER STIFF 5F (SHEATH) ×2 IMPLANT
LUBRICANT VIPERSLIDE CORONARY (MISCELLANEOUS) ×2 IMPLANT
PACK CARDIAC CATHETERIZATION (CUSTOM PROCEDURE TRAY) ×2 IMPLANT
SLED PULL BACK IVUS (MISCELLANEOUS) ×2 IMPLANT
STENT SYNERGY DES 3X20 (Permanent Stent) ×2 IMPLANT
TRANSDUCER W/STOPCOCK (MISCELLANEOUS) ×2 IMPLANT
TUBING CIL FLEX 10 FLL-RA (TUBING) ×2 IMPLANT
VALVE GUARDIAN II ~~LOC~~ HEMO (MISCELLANEOUS) ×2 IMPLANT
WIRE HI TORQ VERSACORE-J 145CM (WIRE) ×2 IMPLANT
WIRE HI TORQ WHISPER MS 300CM (WIRE) ×2 IMPLANT
WIRE MICROINTRODUCER 60CM (WIRE) ×2 IMPLANT
WIRE RUNTHROUGH .014X180CM (WIRE) ×2 IMPLANT
WIRE RUNTHROUGH EXTENSION (WIRE) ×2 IMPLANT
WIRE VIPER ADVANCE COR .012TIP (WIRE) ×4 IMPLANT

## 2016-08-31 NOTE — Interval H&P Note (Signed)
History and Physical Interval Note:  08/31/2016 9:28 AM  Anna Prince  has presented today for percutaneous coronary interveion, with the diagnosis of NSTEMI. The various methods of treatment have been discussed with the patient and family. After consideration of risks, benefits and other options for treatment, the patient has consented to  Procedure(s): Coronary Atherectomy (N/A) as a surgical intervention .  The patient's history has been reviewed, patient examined, no change in status, stable for surgery.  I have reviewed the patient's chart and labs.  Questions were answered to the patient's satisfaction.    Cath Lab Visit (complete for each Cath Lab visit)  Clinical Evaluation Leading to the Procedure:   ACS: Yes.   (NSTEMI)  Non-ACS:  N/A  I have personally reviewed Anna Prince Images from [facility] and discussed her case with Dr. Burt Knack. I agree that her situation is complicated. Given her refusal of surgical intervention, we have agreed to attempt PCI to the proximal LAD and first diagonal branch. Orbital atherectomy will be utilized due to dense calcification involving the proximal LAD. The intervention is high risk due to extent of her coronary disease, age, anemia, and chronic kidney disease. I have spoken with the patient at length as well as with her daughter and grandson regarding the increased risk of the procedure and alternatives including medical therapy. They have agreed to proceed with PCI. Her hemoglobin has improved appropriately following transfusion of a single unit of PRBC yesterday.  Anna Prince

## 2016-08-31 NOTE — Progress Notes (Signed)
ANTICOAGULATION CONSULT NOTE - Follow Up Consult  Pharmacy Consult for Heparin Indication: CAD  Allergies  Allergen Reactions  . Morphine Sulfate Other (See Comments)    Reaction:  Hallucinations     Patient Measurements: Height: 5\' 5"  (165.1 cm) Weight: 112 lb 1.6 oz (50.8 kg) IBW/kg (Calculated) : 57   Vital Signs: Temp: 98.3 F (36.8 C) (05/11 0450) Temp Source: Oral (05/11 0450) BP: 123/54 (05/11 0450) Pulse Rate: 64 (05/11 0450)  Labs:  Recent Labs  08/29/16 0320  08/29/16 2005 08/30/16 0302 08/31/16 0331 08/31/16 0534  HGB 7.6*  --   --  7.6* 8.6*  --   HCT 24.1*  --   --  24.2* 27.4*  --   PLT 143*  --   --  150 142*  --   LABPROT  --   --   --   --   --  14.2  INR  --   --   --   --   --  1.09  HEPARINUNFRC 0.29*  < > 0.51 0.56 0.67  --   CREATININE 1.36*  --   --   --   --  1.36*  < > = values in this interval not displayed.  Estimated Creatinine Clearance: 25.6 mL/min (A) (by C-G formula based on SCr of 1.36 mg/dL (H)).  Assessment: Anticoag: started on heparin for ACS and then continues after cath for CABG plans. Pt refuses CABG. HL 0.56. Hgb remains low at 7.6. Plan PCI (not today due to low Hgb). Transfuse 1 unit. Hgb improved to 8.6. HL 0.67 in goal.   Goal of Therapy:  Heparin level 0.3-0.7 units/ml Monitor platelets by anticoagulation protocol: Yes   Plan:  PCI 5/11 (atherectomy and stenting of the LAD) Continue IV heparin at 1250 units/hr Daily HL/CBC   Vadis Slabach S. Alford Highland, PharmD, BCPS Clinical Staff Pharmacist Pager [phone]  Eilene Ghazi Stillinger 08/31/2016,9:34 AM

## 2016-08-31 NOTE — Progress Notes (Signed)
Progress Note  Patient Name: Anna Prince Date of Encounter: 08/31/2016  Primary Cardiologist: Dr. Ubaldo Glassing  Subjective   Daughter at bedside this morning. Pt without CP or dyspnea. Ready for PCI - all questions answered. She's already talked with Dr End this am.   Inpatient Medications    Scheduled Meds: . aspirin EC  81 mg Oral Daily  . atorvastatin  40 mg Oral q1800  . clopidogrel  75 mg Oral Daily  . ferrous sulfate  325 mg Oral Q breakfast  . megestrol  40 mg Oral Daily  . metoprolol tartrate  12.5 mg Oral BID  . mirtazapine  45 mg Oral QHS  . mometasone-formoterol  2 puff Inhalation BID  . pantoprazole  40 mg Oral Daily  . PARoxetine  30 mg Oral Daily  . sodium chloride flush  3 mL Intravenous Q12H  . sodium chloride flush  3 mL Intravenous Q12H   Continuous Infusions: . sodium chloride    . sodium chloride    . sodium chloride 1 mL/kg/hr (08/31/16 0700)  . heparin 1,250 Units/hr (08/31/16 0322)   PRN Meds: sodium chloride, sodium chloride, acetaminophen, diazepam, HYDROcodone-acetaminophen, magnesium hydroxide, nitroGLYCERIN, ondansetron (ZOFRAN) IV, sodium chloride flush, sodium chloride flush   Vital Signs    Vitals:   08/30/16 1751 08/30/16 1954 08/30/16 2028 08/31/16 0450  BP: (!) 112/53 104/89  (!) 123/54  Pulse: 64 60  64  Resp: 20 18  18   Temp: 98.1 F (36.7 C) 99 F (37.2 C)  98.3 F (36.8 C)  TempSrc: Oral Oral  Oral  SpO2: 100% 98% 98% 97%  Weight:    112 lb 1.6 oz (50.8 kg)  Height:        Intake/Output Summary (Last 24 hours) at 08/31/16 2458 Last data filed at 08/30/16 1751  Gross per 24 hour  Intake              665 ml  Output                0 ml  Net              665 ml   Filed Weights   08/30/16 0220 08/30/16 0535 08/31/16 0450  Weight: 119 lb 8 oz (54.2 kg) 118 lb 3.2 oz (53.6 kg) 112 lb 1.6 oz (50.8 kg)    Telemetry    HR 60-70's Sinus rhythm, occasional premature complexes.- Personally Reviewed  ECG    HR 60, Sinus  rhythm with premature atrial complexes, LVH, T-wave - Personally Reviewed  Physical Exam   My Exam Today: Vitals:   08/30/16 1954 08/31/16 0450  BP: 104/89 (!) 123/54  Pulse: 60 64  Resp: 18 18  Temp: 99 F (37.2 C) 98.3 F (36.8 C)   Pt is alert and oriented, elderly, thin woman in NAD HEENT: normal Neck: JVP - normal Lungs: CTA bilaterally CV: RRR without murmur or gallop Abd: soft, NT Ext: no C/C/E, distal pulses intact and equal Skin: warm/dry no rash  Labs    Chemistry  Recent Labs Lab 08/28/16 0659 08/29/16 0320 08/31/16 0534  NA 137 138 138  K 4.2 4.1 4.2  CL 112* 110 110  CO2 20* 20* 19*  GLUCOSE 107* 130* 99  BUN 31* 28* 27*  CREATININE 1.24* 1.36* 1.36*  CALCIUM 8.8* 8.8* 9.0  PROT  --  5.4*  --   ALBUMIN  --  3.0*  --   AST  --  19  --  ALT  --  15  --   ALKPHOS  --  53  --   BILITOT  --  0.3  --   GFRNONAA 39* 35* 35*  GFRAA 46* 41* 41*  ANIONGAP 5 8 9      Hematology  Recent Labs Lab 08/29/16 0320 08/30/16 0302 08/31/16 0331  WBC 6.6 5.6 6.0  RBC 2.66* 2.66* 3.07*  HGB 7.6* 7.6* 8.6*  HCT 24.1* 24.2* 27.4*  MCV 90.6 91.0 89.3  MCH 28.6 28.6 28.0  MCHC 31.5 31.4 31.4  RDW 13.7 13.9 14.6  PLT 143* 150 142*    Cardiac Enzymes  Recent Labs Lab 08/24/16 1453 08/24/16 2055 08/25/16 0321  TROPONINI 1.15* 0.91* 0.87*    Radiology    No results found.  Cardiac Studies   Left Heart Cath and Coronary Angiography 08/27/2016   Conclusion     Ost LM lesion, 15 %stenosed.  Prox LAD lesion, 99 %stenosed.  Ost 1st Diag lesion, 99 %stenosed.  Mid RCA to Dist RCA lesion, 85 %stenosed.  Ost RPDA to RPDA lesion, 100 %stenosed.  The left ventricular ejection fraction is 50-55% by visual estimate.  There is mild left ventricular systolic dysfunction.  LV end diastolic pressure is normal.  There is no aortic valve stenosis.   Bifurcating proximal lad and d1 with 95-99% proxiimal lad and D1. Heavily calcified left main  and proximal lad. 60-70% ostial lcx. Diffusely diseased rca with 100% pd with collaterals from the left.  Preserved LV funciton Tortuous aorta.  Very high risk PCI lesions will need consideration for cabg.    Transthoracic Echocardiography 08/25/2016  Study Conclusions  - Left ventricle: Wall thickness was increased in a pattern of mild   LVH. Systolic function was normal. The estimated ejection   fraction was in the range of 50% to 55%. Doppler parameters are   consistent with abnormal left ventricular relaxation (grade 1   diastolic dysfunction). - Aortic valve: Valve area (Vmax): 1.86 cm^2. - Mitral valve: There was mild regurgitation. - Right atrium: The atrium was mildly dilated.   Patient Profile     83 y.o. female with a history of ongoing tobacco abuse, HTN, HLD, CKD, aortic aneurysm s/p repair (2010) and COPD who was transferred to [facility] from [facility] on 08/28/16 for CTCS consult. Patient refusing bypass surgery, so cardiology consulted to discuss PCI options.   Assessment & Plan    CAD/NSTEMI: Multivessel CAD. Discussion as per prior notes. Has declined CABG. After review of options she has elected to move forward with high-risk PCI of the critical stenosis at the LAD/diagonal bifurcation with atherectomy. Scheduled for 0900 today. Pt tolerating ASA and plavix. Procedural risks, indications, and alternatives have been discussed at length.   Anemia, iron deficiency: received 1 u PRBC's yesterday. HgB 8.6 mg/dL this am. Suspect combination of iron deficiency, chronic disease, frequent phlebotomy. No signs of active bleeding. Not a candidate for endoscopic evaluation with her presentation of NSTEMI and critical CAD. Continue to monitor, ferrous sulfate.  Hypertension: BP controlled. Lisinopril held for cath today.  COPD with ongoing tobacco abuse: Pt has decided based on age and life expectancy that she is not interested in quitting.  HLD: cont high-intensity statin  CKD: creat  stable 1.36 mg/dL this am  s/p aortic aneurysm repair.  Dispo: pending PCI result today.  Sherren Mocha, M.D. 08/31/2016 7:12 AM

## 2016-08-31 NOTE — Brief Op Note (Signed)
Brief Cardiac Catheterization Report  Date: 08/31/2016 Time: 12:01 PM  PATIENT:  Anna Prince  83 y.o. female  PRE-OPERATIVE DIAGNOSIS:  cad  POST-OPERATIVE DIAGNOSIS:  * No post-op diagnosis entered *  PROCEDURE:  Procedure(s): Coronary Atherectomy (N/A) Intravascular Ultrasound/IVUS (N/A)  SURGEON:  Surgeon(s) and Role:    * Samba Cumba, Harrell Gave, MD - Primary  FINDINGS: 1. Severe proximal LAD stenosis involving D1. 2. Successful orbital atherectomy and PCI to the proximal LAD with 0% residual stenosis with TIMI-3 flow. 3. Ostial D1 occluded during atherectomy of the proximal LAD. However, given TIMI-2 flow at start of case, collateral filling, and lack of chest pain, intervention to D1 was not attempted.  RECOMMENDATIONS: 1. DAPT with aspirin and clopidogrel for up to 12 months if possible (minimum of 3 months). 2. Risk factor modification. 3. Close monitoring of renal function and hemoglobin.  Nelva Bush, MD [facility] Pager: [phone]

## 2016-08-31 NOTE — H&P (View-Only) (Signed)
Progress Note  Patient Name: Anna Prince Date of Encounter: 08/31/2016  Primary Cardiologist: Dr. Ubaldo Glassing  Subjective   Daughter at bedside this morning. Pt without CP or dyspnea. Ready for PCI - all questions answered. She's already talked with Dr End this am.   Inpatient Medications    Scheduled Meds: . aspirin EC  81 mg Oral Daily  . atorvastatin  40 mg Oral q1800  . clopidogrel  75 mg Oral Daily  . ferrous sulfate  325 mg Oral Q breakfast  . megestrol  40 mg Oral Daily  . metoprolol tartrate  12.5 mg Oral BID  . mirtazapine  45 mg Oral QHS  . mometasone-formoterol  2 puff Inhalation BID  . pantoprazole  40 mg Oral Daily  . PARoxetine  30 mg Oral Daily  . sodium chloride flush  3 mL Intravenous Q12H  . sodium chloride flush  3 mL Intravenous Q12H   Continuous Infusions: . sodium chloride    . sodium chloride    . sodium chloride 1 mL/kg/hr (08/31/16 0700)  . heparin 1,250 Units/hr (08/31/16 0322)   PRN Meds: sodium chloride, sodium chloride, acetaminophen, diazepam, HYDROcodone-acetaminophen, magnesium hydroxide, nitroGLYCERIN, ondansetron (ZOFRAN) IV, sodium chloride flush, sodium chloride flush   Vital Signs    Vitals:   08/30/16 1751 08/30/16 1954 08/30/16 2028 08/31/16 0450  BP: (!) 112/53 104/89  (!) 123/54  Pulse: 64 60  64  Resp: 20 18  18   Temp: 98.1 F (36.7 C) 99 F (37.2 C)  98.3 F (36.8 C)  TempSrc: Oral Oral  Oral  SpO2: 100% 98% 98% 97%  Weight:    112 lb 1.6 oz (50.8 kg)  Height:        Intake/Output Summary (Last 24 hours) at 08/31/16 5284 Last data filed at 08/30/16 1751  Gross per 24 hour  Intake              665 ml  Output                0 ml  Net              665 ml   Filed Weights   08/30/16 0220 08/30/16 0535 08/31/16 0450  Weight: 119 lb 8 oz (54.2 kg) 118 lb 3.2 oz (53.6 kg) 112 lb 1.6 oz (50.8 kg)    Telemetry    HR 60-70's Sinus rhythm, occasional premature complexes.- Personally Reviewed  ECG    HR 60, Sinus  rhythm with premature atrial complexes, LVH, T-wave - Personally Reviewed  Physical Exam   My Exam Today: Vitals:   08/30/16 1954 08/31/16 0450  BP: 104/89 (!) 123/54  Pulse: 60 64  Resp: 18 18  Temp: 99 F (37.2 C) 98.3 F (36.8 C)   Pt is alert and oriented, elderly, thin woman in NAD HEENT: normal Neck: JVP - normal Lungs: CTA bilaterally CV: RRR without murmur or gallop Abd: soft, NT Ext: no C/C/E, distal pulses intact and equal Skin: warm/dry no rash  Labs    Chemistry  Recent Labs Lab 08/28/16 0659 08/29/16 0320 08/31/16 0534  NA 137 138 138  K 4.2 4.1 4.2  CL 112* 110 110  CO2 20* 20* 19*  GLUCOSE 107* 130* 99  BUN 31* 28* 27*  CREATININE 1.24* 1.36* 1.36*  CALCIUM 8.8* 8.8* 9.0  PROT  --  5.4*  --   ALBUMIN  --  3.0*  --   AST  --  19  --  ALT  --  15  --   ALKPHOS  --  53  --   BILITOT  --  0.3  --   GFRNONAA 39* 35* 35*  GFRAA 46* 41* 41*  ANIONGAP 5 8 9      Hematology  Recent Labs Lab 08/29/16 0320 08/30/16 0302 08/31/16 0331  WBC 6.6 5.6 6.0  RBC 2.66* 2.66* 3.07*  HGB 7.6* 7.6* 8.6*  HCT 24.1* 24.2* 27.4*  MCV 90.6 91.0 89.3  MCH 28.6 28.6 28.0  MCHC 31.5 31.4 31.4  RDW 13.7 13.9 14.6  PLT 143* 150 142*    Cardiac Enzymes  Recent Labs Lab 08/24/16 1453 08/24/16 2055 08/25/16 0321  TROPONINI 1.15* 0.91* 0.87*    Radiology    No results found.  Cardiac Studies   Left Heart Cath and Coronary Angiography 08/27/2016   Conclusion     Ost LM lesion, 15 %stenosed.  Prox LAD lesion, 99 %stenosed.  Ost 1st Diag lesion, 99 %stenosed.  Mid RCA to Dist RCA lesion, 85 %stenosed.  Ost RPDA to RPDA lesion, 100 %stenosed.  The left ventricular ejection fraction is 50-55% by visual estimate.  There is mild left ventricular systolic dysfunction.  LV end diastolic pressure is normal.  There is no aortic valve stenosis.   Bifurcating proximal lad and d1 with 95-99% proxiimal lad and D1. Heavily calcified left main  and proximal lad. 60-70% ostial lcx. Diffusely diseased rca with 100% pd with collaterals from the left.  Preserved LV funciton Tortuous aorta.  Very high risk PCI lesions will need consideration for cabg.    Transthoracic Echocardiography 08/25/2016  Study Conclusions  - Left ventricle: Wall thickness was increased in a pattern of mild   LVH. Systolic function was normal. The estimated ejection   fraction was in the range of 50% to 55%. Doppler parameters are   consistent with abnormal left ventricular relaxation (grade 1   diastolic dysfunction). - Aortic valve: Valve area (Vmax): 1.86 cm^2. - Mitral valve: There was mild regurgitation. - Right atrium: The atrium was mildly dilated.   Patient Profile     83 y.o. female with a history of ongoing tobacco abuse, HTN, HLD, CKD, aortic aneurysm s/p repair (2010) and COPD who was transferred to [facility] from [facility] on 08/28/16 for CTCS consult. Patient refusing bypass surgery, so cardiology consulted to discuss PCI options.   Assessment & Plan    CAD/NSTEMI: Multivessel CAD. Discussion as per prior notes. Has declined CABG. After review of options she has elected to move forward with high-risk PCI of the critical stenosis at the LAD/diagonal bifurcation with atherectomy. Scheduled for 0900 today. Pt tolerating ASA and plavix. Procedural risks, indications, and alternatives have been discussed at length.   Anemia, iron deficiency: received 1 u PRBC's yesterday. HgB 8.6 mg/dL this am. Suspect combination of iron deficiency, chronic disease, frequent phlebotomy. No signs of active bleeding. Not a candidate for endoscopic evaluation with her presentation of NSTEMI and critical CAD. Continue to monitor, ferrous sulfate.  Hypertension: BP controlled. Lisinopril held for cath today.  COPD with ongoing tobacco abuse: Pt has decided based on age and life expectancy that she is not interested in quitting.  HLD: cont high-intensity statin  CKD: creat  stable 1.36 mg/dL this am  s/p aortic aneurysm repair.  Dispo: pending PCI result today.  Sherren Mocha, M.D. 08/31/2016 7:12 AM

## 2016-08-31 NOTE — Progress Notes (Signed)
TR BAND REMOVAL  LOCATION:    right radial  DEFLATED PER PROTOCOL:    Yes.    TIME BAND OFF / DRESSING APPLIED:    1700   SITE UPON ARRIVAL:    Level 0  SITE AFTER BAND REMOVAL:    Level 0  CIRCULATION SENSATION AND MOVEMENT:    Within Normal Limits   Yes.    COMMENTS:tolerated procedure well

## 2016-08-31 NOTE — Care Management Important Message (Signed)
Important Message  Patient Details  Name: Anna Prince MRN: [id number] Date of Birth: January 11, 1932   Medicare Important Message Given:  Yes    Nathen May 08/31/2016, 1:11 PM

## 2016-08-31 NOTE — Care Management Note (Signed)
Case Management Note  Patient Details  Name: Anna Prince MRN: [id number] Date of Birth: [birth year]  Subjective/Objective:    s/p Coronary Atherectomy, will be on plavix and asa.                Action/Plan: NCM will follow for dc needs.  Expected Discharge Date:                  Expected Discharge Plan:  Home/Self Care  In-House Referral:     Discharge planning Services  CM Consult  Post Acute Care Choice:    Choice offered to:     DME Arranged:    DME Agency:     HH Arranged:    HH Agency:     Status of Service:  In process, will continue to follow  If discussed at Long Length of Stay Meetings, dates discussed:    Additional Comments:  Zenon Mayo, RN 08/31/2016, 4:05 PM

## 2016-09-01 DIAGNOSIS — N183 Chronic kidney disease, stage 3 (moderate): Secondary | ICD-10-CM

## 2016-09-01 DIAGNOSIS — E782 Mixed hyperlipidemia: Secondary | ICD-10-CM

## 2016-09-01 DIAGNOSIS — K922 Gastrointestinal hemorrhage, unspecified: Secondary | ICD-10-CM

## 2016-09-01 DIAGNOSIS — I25118 Atherosclerotic heart disease of native coronary artery with other forms of angina pectoris: Secondary | ICD-10-CM

## 2016-09-01 DIAGNOSIS — I9589 Other hypotension: Secondary | ICD-10-CM

## 2016-09-01 DIAGNOSIS — Z955 Presence of coronary angioplasty implant and graft: Secondary | ICD-10-CM

## 2016-09-01 DIAGNOSIS — I714 Abdominal aortic aneurysm, without rupture: Secondary | ICD-10-CM

## 2016-09-01 LAB — CBC
HCT: 23.7 % — ABNORMAL LOW (ref 36.0–46.0)
Hemoglobin: 7.6 g/dL — ABNORMAL LOW (ref 12.0–15.0)
MCH: 29.1 pg (ref 26.0–34.0)
MCHC: 32.1 g/dL (ref 30.0–36.0)
MCV: 90.8 fL (ref 78.0–100.0)
PLATELETS: 128 10*3/uL — AB (ref 150–400)
RBC: 2.61 MIL/uL — AB (ref 3.87–5.11)
RDW: 14.7 % (ref 11.5–15.5)
WBC: 5.3 10*3/uL (ref 4.0–10.5)

## 2016-09-01 LAB — BASIC METABOLIC PANEL
Anion gap: 7 (ref 5–15)
BUN: 27 mg/dL — ABNORMAL HIGH (ref 6–20)
CHLORIDE: 110 mmol/L (ref 101–111)
CO2: 20 mmol/L — AB (ref 22–32)
CREATININE: 1.3 mg/dL — AB (ref 0.44–1.00)
Calcium: 8.7 mg/dL — ABNORMAL LOW (ref 8.9–10.3)
GFR calc non Af Amer: 37 mL/min — ABNORMAL LOW (ref >60)
GFR, EST AFRICAN AMERICAN: 43 mL/min — AB (ref >60)
Glucose, Bld: 105 mg/dL — ABNORMAL HIGH (ref 65–99)
POTASSIUM: 4.3 mmol/L (ref 3.5–5.1)
Sodium: 137 mmol/L (ref 135–145)

## 2016-09-01 LAB — PREPARE RBC (CROSSMATCH)

## 2016-09-01 MED ORDER — ANGIOPLASTY BOOK
Freq: Once | Status: DC
  Filled 2016-09-01: qty 1

## 2016-09-01 MED ORDER — PANTOPRAZOLE SODIUM 40 MG PO TBEC
40.0000 mg | DELAYED_RELEASE_TABLET | Freq: Two times a day (BID) | ORAL | Status: DC
  Administered 2016-09-01: 40 mg via ORAL
  Filled 2016-09-01: qty 1

## 2016-09-01 MED ORDER — PANTOPRAZOLE SODIUM 40 MG IV SOLR
40.0000 mg | Freq: Two times a day (BID) | INTRAVENOUS | Status: DC
  Administered 2016-09-01 – 2016-09-02 (×3): 40 mg via INTRAVENOUS
  Filled 2016-09-01 (×3): qty 40

## 2016-09-01 MED ORDER — SODIUM CHLORIDE 0.9 % IV SOLN: Freq: Once | INTRAVENOUS | Status: DC

## 2016-09-01 NOTE — Progress Notes (Signed)
Progress Note  Patient Name: Anna Prince Date of Encounter: 09/01/2016  Primary Cardiologist: Dr. Ubaldo Glassing  Subjective   Feeling well this morning, no chest pain or SOB. Has not been out of bed. No palpitations. No issues with cath site. Denies BRBPR, melena, hematemesis, hematuria. Hemoglobin dropped to 7.6 again and FOBT+. Reports colonoscopy 2016, report reviewed and showed diverticulosis.  Inpatient Medications    Scheduled Meds: . angioplasty book   Does not apply Once  . aspirin EC  81 mg Oral Daily  . atorvastatin  40 mg Oral q1800  . clopidogrel  75 mg Oral Daily  . ferrous sulfate  325 mg Oral Q breakfast  . heparin  5,000 Units Subcutaneous Q8H  . megestrol  40 mg Oral Daily  . metoprolol tartrate  12.5 mg Oral BID  . mirtazapine  45 mg Oral QHS  . mometasone-formoterol  2 puff Inhalation BID  . pantoprazole  40 mg Oral Daily  . PARoxetine  30 mg Oral Daily  . sodium chloride flush  3 mL Intravenous Q12H  . sodium chloride flush  3 mL Intravenous Q12H   Continuous Infusions: . sodium chloride    . sodium chloride     PRN Meds: sodium chloride, sodium chloride, acetaminophen, diazepam, docusate sodium, HYDROcodone-acetaminophen, magnesium hydroxide, nitroGLYCERIN, ondansetron (ZOFRAN) IV, sodium chloride flush, sodium chloride flush   Vital Signs    Vitals:   08/31/16 2200 08/31/16 2305 09/01/16 0240 09/01/16 0645  BP: (!) 93/35 (!) 111/55 (!) 109/52 (!) 120/49  Pulse: (!) 59 61 65 (!) 58  Resp: (!) 26 (!) 22 18 (!) 22  Temp:   98.6 F (37 C)   TempSrc:   Oral   SpO2: 98% 100% 100% 100%  Weight:      Height:        Intake/Output Summary (Last 24 hours) at 09/01/16 0752 Last data filed at 08/31/16 2315  Gross per 24 hour  Intake              860 ml  Output              400 ml  Net              460 ml   Filed Weights   08/30/16 0220 08/30/16 0535 08/31/16 0450  Weight: 119 lb 8 oz (54.2 kg) 118 lb 3.2 oz (53.6 kg) 112 lb 1.6 oz (50.8 kg)     Telemetry    NSR/SA - Personally Reviewed  ECG    NSR 62bpm LVH with possible repol abnormality similar to prior - Personally Reviewed  Physical Exam   GEN: No acute distress.  HEENT: Normocephalic, atraumatic, sclera non-icteric. Neck: No JVD or bruits. Cardiac: RRR no rubs or gallops.  Mild SEM apex Radials/DP/PT 1+ and equal bilaterally.  Respiratory: Clear to auscultation bilaterally. Breathing is unlabored. GI: Soft, nontender, non-distended, BS +x 4. MS: no deformity. Extremities: No clubbing or cyanosis. No edema. Distal pedal pulses are 2+ and equal bilaterally. Right radial cath site without hematoma or ecchymosis; good pulse. Neuro:  AAOx3. Follows commands. Psych:  Responds to questions appropriately with a normal affect.  Labs    Chemistry Recent Labs Lab 08/29/16 0320 08/31/16 0534 09/01/16 0450  NA 138 138 137  K 4.1 4.2 4.3  CL 110 110 110  CO2 20* 19* 20*  GLUCOSE 130* 99 105*  BUN 28* 27* 27*  CREATININE 1.36* 1.36* 1.30*  CALCIUM 8.8* 9.0 8.7*  PROT 5.4*  --   --  ALBUMIN 3.0*  --   --   AST 19  --   --   ALT 15  --   --   ALKPHOS 53  --   --   BILITOT 0.3  --   --   GFRNONAA 35* 35* 37*  GFRAA 41* 41* 43*  ANIONGAP 8 9 7      Hematology Recent Labs Lab 08/30/16 0302 08/31/16 0331 09/01/16 0450  WBC 5.6 6.0 5.3  RBC 2.66* 3.07* 2.61*  HGB 7.6* 8.6* 7.6*  HCT 24.2* 27.4* 23.7*  MCV 91.0 89.3 90.8  MCH 28.6 28.0 29.1  MCHC 31.4 31.4 32.1  RDW 13.9 14.6 14.7  PLT 150 142* 128*    Radiology    No results found.  Cardiac Studies   Left Heart Cath and Coronary Angiography 08/27/2016   Conclusion     Ost LM lesion, 15 %stenosed.  Prox LAD lesion, 99 %stenosed.  Ost 1st Diag lesion, 99 %stenosed.  Mid RCA to Dist RCA lesion, 85 %stenosed.  Ost RPDA to RPDA lesion, 100 %stenosed.  The left ventricular ejection fraction is 50-55% by visual estimate.  There is mild left ventricular systolic dysfunction.  LV  end diastolic pressure is normal.  There is no aortic valve stenosis.  Bifurcating proximal lad and d1 with 95-99% proxiimal lad and D1. Heavily calcified left main and proximal lad. 60-70% ostial lcx. Diffusely diseased rca with 100% pd with collaterals from the left.  Preserved LV funciton Tortuous aorta.  Very high risk PCI lesions will need consideration for cabg.    Transthoracic Echocardiography 08/25/2016  Study Conclusions  - Left ventricle: Wall thickness was increased in a pattern of mild LVH. Systolic function was normal. The estimated ejection fraction was in the range of 50% to 55%. Doppler parameters are consistent with abnormal left ventricular relaxation (grade 1 diastolic dysfunction). - Aortic valve: Valve area (Vmax): 1.86 cm^2. - Mitral valve: There was mild regurgitation. - Right atrium: The atrium was mildly dilated.    Patient Profile     83 y.o. female with ongoing tobacco abuse, HTN, HLD, CKD III, aortic aneurysm s/p repair (2010) and COPD who was admitted to [facility] 08/25/16 with chest pain, NSTEMI, hypertensive urgency. LHC 08/24/16 showed LVEF 50-55%, proximal LAD 99%, ostial 1st diagonal 99% stenosed, mid to distal RCA 85% stenosed, and ostial RPDA 100 % stenosed. Seen by CVTS - patient refused bypass surgery, so PCI pursued. Also found to have acute on chronic anemia with hemoglobin nadir 7.6. On 08/31/16, underwent orbital atherectomy/DES to prox LAD - moderate-caliber D1 branch became occluded at its ostium during orbital atherectomy. Given lack of chest pain and collateral filling of the branch, decision was made not to intervene on this vessel.  Assessment & Plan    1. CAD/NSTEMI - patient refused bypass, underwent high risk PCI yesterday. Post-procedurally she is doing well but major issue at present seems to be #2. See below.  2. Iron deficiency anemia - + FOBT overnight, hemoglobin has dropped again to 7.6. Hemoglobin was 11-12 in 2017 -> 9.6  on adm -> 7.6 on 5/8&9 prompting 1 U PRBC -> 8.6 -> 7.6. Workup not pursued earlier this admission given NSTEMI and critical CAD, felt possibly due to iron deficiency, chronic disease, frequent phlebotomy. She also reports a history of diverticulitis. Discussed with Dr. Bronson Ing. With fresh LAD stent unable to stop DAPT; will give 1 U PRBCs over 3 hours and ask GI to weigh in. Increase Protonix to 40mg  BID. Consider titration  of iron which is new this admission (?utility of iron infusion if conservative therapy is chosen). Hold SQ heparin and place SCDs.  3. HTN - episodic hypotension documented overnight down to 67/42, up to 120/49 this AM. Only antihypertensive on board is metoprolol, will hold for this morning and trend BP for now. Transfuse 1 u PRBC.  4. COPD with ongoing tobacco abuse - patient has decided based on age/life expectancy she has no interest in quitting.  5. CKD stage III - stable post-cath. Follow.  6. Hyperlipidemia - continue statin.  Dispo: needs to remain inpatient for downtrending Hgb and +FOBT. F/u CBC planned for AM. As she will not be going home will transfer out of 6c.  Signed, Charlie Pitter, PA-C  09/01/2016, 7:52 AM   The patient was seen and examined, and I agree with the history, physical exam, assessment and plan as documented above, with modifications as noted below. 83 yr old woman who underwent complex PCI of proximal LAD yesterday. Had abdominal pain and diarrhea yesterday. Hgb has dropped. Has a h/o diverticulitis and she says it feels as it did before when she had it. Today has diffuse abdominal tenderness, no rebound tenderness. Denies chest pain. She requires dual antiplatelet therapy given fresh LAD stent. Will transfuse 1 unit PRBC's and consult gastroenterology. Continue ASA, Lipitor, and Plavix. Will hold metoprolol for episodic hypotension. Is normotensive this morning.  Kate Sable, MD, [facility]  09/01/2016 9:01 AM

## 2016-09-01 NOTE — Progress Notes (Signed)
CARDIAC REHAB PHASE I   Pt receiving blood, not ready for ambulation at this time. Completed PCI/stent education.  Reviewed risk factors, tobacco cessation, PCI book, anti-platelet therapy, stent card, activity restrictions, ntg, exercise, heart healthy diet, and phase 2 cardiac rehab. Pt verbalized understanding. Pt agrees to phase 2 cardiac rehab referral, will send to [facility] per pt request. Pt in bed, call bell within reach.     [id number] Lenna Sciara, RN, BSN 09/01/2016 10:45 AM

## 2016-09-01 NOTE — Consult Note (Addendum)
Referring Provider:  Dr. Bronson Ing  Primary Care Physician:  Crecencio Mc, MD Primary Gastroenterologist:  Joaquin Music  Reason for Consultation:   Anemia/ OB + Stool   HPI: Anna Prince is a 83 y.o. female who was initially seen at [facility] for on and off chest pain for 3 months duration. Subsequent left heart cath showed multivessel coronary artery disease. CABG was recommended but patient declined. Patient underwent complex PCI yesterday with the stent placement and currently on Plavix and aspirin. Patient was noted to have anemia prior to procedure but given her underlying heart condition it was felt to proceed with PCI. Patient again noted to have drop in hemoglobin yesterday. GI is consulted as a occult blood is positive.  Patient seen and examined. Patient stated that she has been having on and off worsening acid reflux since last 3 months. She is also complaining of generalized abdominal discomfort along with epigastric pain. Patient also had some diarrhea yesterday which is currently resolved. Discussed with the nursing staff. She had an episode of black tarry stool yesterday as well. Occult blood came back positive. No bowel movement today.  Her colonoscopy in March 2016 at [facility] which showed diverticulosis. By Dr. Verdie Shire   Past Medical History:  Diagnosis Date  . AAA (abdominal aortic aneurysm) ([place])   . Anemia 2012   of acute blood loss, resolved  . Anxiety   . Aortic aneurysm ([place])   . Arthritis   . Barrett's esophagus 2007  . Bursitis 2012   left hip, improved with periodic steroid injection [facility] [place])  . COPD (chronic obstructive pulmonary disease) ([place])   . Depression   . GERD (gastroesophageal reflux disease)    with Barretts Esophagus  . Hyperlipidemia   . Hypertension   . Lumbago   . Macular degeneration   . Osteoporosis   . Sigmoid diverticulosis    by colonoscopy    Past Surgical History:  Procedure Laterality Date  . ABDOMINAL  AORTIC ANEURYSM REPAIR  July 2010   [facility]  . APPENDECTOMY    . BACK SURGERY     X2..1975 arachnoid cyst cervical region([place]);1997 lumbosacral tumor,9 hr surgery  . LEFT HEART CATH AND CORONARY ANGIOGRAPHY N/A 08/27/2016   Procedure: Left Heart Cath and Coronary Angiography;  Surgeon: Teodoro Spray, MD;  Location: [facility];  Service: Cardiovascular;  Laterality: N/A;    Prior to Admission medications   Medication Sig Start Date End Date Taking? Authorizing Provider  albuterol (PROVENTIL HFA;VENTOLIN HFA) 108 (90 Base) MCG/ACT inhaler Inhale 2 puffs into the lungs every 6 (six) hours as needed for wheezing or shortness of breath. 11/11/15   Alfred Levins, [place], MD  aspirin EC 81 MG EC tablet Take 1 tablet (81 mg total) by mouth daily. 08/28/16   Bettey Costa, MD  atorvastatin (LIPITOR) 40 MG tablet Take 1 tablet (40 mg total) by mouth daily at 6 PM. 08/27/16   Bettey Costa, MD  budesonide-formoterol (SYMBICORT) 80-4.5 MCG/ACT inhaler Inhale 2 puffs into the lungs 2 (two) times daily. 05/17/16   Crecencio Mc, MD  diazepam (VALIUM) 5 MG tablet Take 0.5 tablets (2.5 mg total) by mouth 2 (two) times daily as needed. 04/09/16   Crecencio Mc, MD  HYDROcodone-acetaminophen (NORCO/VICODIN) 5-325 MG tablet Take 1 tablet by mouth every 6 (six) hours as needed for moderate pain. Maximum 2 daily. 08/24/16   Crecencio Mc, MD  lisinopril (PRINIVIL,ZESTRIL) 20 MG tablet TAKE 1 TABLET BY MOUTH EVERY  DAY 10/27/15   Crecencio Mc, MD  metoprolol tartrate (LOPRESSOR) 25 MG tablet Take 0.5 tablets (12.5 mg total) by mouth 2 (two) times daily. 08/27/16   Bettey Costa, MD  mirtazapine (REMERON) 45 MG tablet Take 1 tablet (45 mg total) by mouth at bedtime. 04/09/16   Crecencio Mc, MD  omeprazole (PRILOSEC) 40 MG capsule TAKE 1 CAPSULE BY MOUTH DAILY. 08/07/16   Crecencio Mc, MD    Scheduled Meds: . angioplasty book   Does not apply Once  . aspirin EC  81 mg Oral Daily  . atorvastatin  40 mg  Oral q1800  . clopidogrel  75 mg Oral Daily  . ferrous sulfate  325 mg Oral Q breakfast  . megestrol  40 mg Oral Daily  . mirtazapine  45 mg Oral QHS  . mometasone-formoterol  2 puff Inhalation BID  . pantoprazole  40 mg Oral BID  . PARoxetine  30 mg Oral Daily  . sodium chloride flush  3 mL Intravenous Q12H  . sodium chloride flush  3 mL Intravenous Q12H   Continuous Infusions: . sodium chloride    . sodium chloride    . sodium chloride     PRN Meds:.sodium chloride, sodium chloride, acetaminophen, diazepam, docusate sodium, HYDROcodone-acetaminophen, magnesium hydroxide, nitroGLYCERIN, ondansetron (ZOFRAN) IV, sodium chloride flush, sodium chloride flush  Allergies as of 08/28/2016 - Review Complete 08/28/2016  Allergen Reaction Noted  . Morphine sulfate Other (See Comments) 12/08/2010    Family History  Problem Relation Age of Onset  . Coronary artery disease Mother   . Coronary artery disease Father 75  . Cancer Neg Hx        no breast,coon or ovarian ca    Social History   Social History  . Marital status: Widowed    Spouse name: N/A  . Number of children: N/A  . Years of education: N/A   Occupational History  . Not on file.   Social History Main Topics  . Smoking status: Current Every Day Smoker    Packs/day: 0.50    Types: Cigarettes  . Smokeless tobacco: Never Used     Comment: HAS BEEN SMOKING 30+ YEARS,RESUMED TOBACCO USE AFTER AAA REPAIR  . Alcohol use No  . Drug use: No  . Sexual activity: Not on file   Other Topics Concern  . Not on file   Social History Narrative  . No narrative on file    Review of Systems:  Review of Systems  Constitutional: Positive for weight loss. Negative for chills and fever.  HENT: Positive for hearing loss. Negative for ear pain and tinnitus.   Eyes: Negative for blurred vision and double vision.  Respiratory: Positive for shortness of breath. Negative for cough and hemoptysis.   Cardiovascular: Positive for  chest pain and palpitations.  Gastrointestinal: Positive for abdominal pain, diarrhea, heartburn and melena.  Genitourinary: Negative for dysuria and urgency.  Musculoskeletal: Negative for myalgias and neck pain.  Skin: Negative for rash.  Neurological: Positive for weakness. Negative for sensory change, speech change, focal weakness and seizures.  Endo/Heme/Allergies: Does not bruise/bleed easily.  Psychiatric/Behavioral: Negative for hallucinations and suicidal ideas.    Physical Exam: Vital signs: Vitals:   09/01/16 1000 09/01/16 1015  BP: 125/64 (!) 116/48  Pulse: 76   Resp: 19 20  Temp: 97.9 F (36.6 C)    Last BM Date: 08/31/16 General:   Alert,  Well-developed, well-nourished, pleasant and cooperative in NAD HEENT : Normocephalic, atraumatic, extraocular movement intact.  Oropharynx clear and moist. Sclerae anicteric Lungs:  Clear throughout to auscultation.   No wheezes, crackles, or rhonchi. No acute distress. Heart:  Regular rate and rhythm; no murmurs, clicks, rubs,  or gallops. Abdomen: Epigastric tenderness to palpation without any peritoneal signs. Soft, nondistended, bowel sounds present. No organomegaly appreciated. LE: no edema  Rectal:  Deferred  GI:  Lab Results:  Recent Labs  08/30/16 0302 08/31/16 0331 09/01/16 0450  WBC 5.6 6.0 5.3  HGB 7.6* 8.6* 7.6*  HCT 24.2* 27.4* 23.7*  PLT 150 142* 128*   BMET  Recent Labs  08/31/16 0534 09/01/16 0450  NA 138 137  K 4.2 4.3  CL 110 110  CO2 19* 20*  GLUCOSE 99 105*  BUN 27* 27*  CREATININE 1.36* 1.30*  CALCIUM 9.0 8.7*   LFT No results for input(s): PROT, ALBUMIN, AST, ALT, ALKPHOS, BILITOT, BILIDIR, IBILI in the last 72 hours. PT/INR  Recent Labs  08/31/16 0534  LABPROT 14.2  INR 1.09     Studies/Results: No results found.  Impression/Plan: - Epigastric pain and melena in setting of dual antiplatelet therapy. Need to rule out ulcer disease. - Acute on chronic blood loss anemia -   Multi-vessel coronary artery disease status post complex PCI with stent placement yesterday.  Recommendations ------------------------ - Case discussed with cardiology Dr. Bronson Ing. Okay to proceed with EGD But patient needs to stay on dual antiplatelet given her complex PCI yesterday.. Plan for EGD tomorrow with anesthesia. Risks benefits alternatives discussed with the patient. Verbalized understanding. Start IV twice a day PPI. Monitor H&H. Transfuse as needed to keep hemoglobin above 8. Clear liquid diet today. Nothing by mouth past midnight. Further plan based on EGD findings.    LOS: 4 days   Otis Brace  MD, FACP 09/01/2016, 11:42 AM  Pager [phone] If no answer or after 5 PM call [phone]

## 2016-09-02 ENCOUNTER — Inpatient Hospital Stay (HOSPITAL_COMMUNITY): Payer: Medicare Other | Admitting: Certified Registered Nurse Anesthetist

## 2016-09-02 ENCOUNTER — Encounter (HOSPITAL_COMMUNITY)
Admission: AD | Disposition: A | Discharge status: Home / Self Care | Payer: Self-pay | Source: Other Acute Inpatient Hospital | Attending: Cardiovascular Disease

## 2016-09-02 ENCOUNTER — Encounter (HOSPITAL_COMMUNITY): Payer: Self-pay | Admitting: *Deleted

## 2016-09-02 DIAGNOSIS — K449 Diaphragmatic hernia without obstruction or gangrene: Secondary | ICD-10-CM

## 2016-09-02 DIAGNOSIS — K219 Gastro-esophageal reflux disease without esophagitis: Secondary | ICD-10-CM

## 2016-09-02 DIAGNOSIS — J432 Centrilobular emphysema: Secondary | ICD-10-CM

## 2016-09-02 DIAGNOSIS — D500 Iron deficiency anemia secondary to blood loss (chronic): Secondary | ICD-10-CM

## 2016-09-02 HISTORY — PX: ESOPHAGOGASTRODUODENOSCOPY (EGD) WITH PROPOFOL: SHX5813

## 2016-09-02 LAB — TYPE AND SCREEN
ABO/RH(D): O POS
Antibody Screen: NEGATIVE
UNIT DIVISION: 0
UNIT DIVISION: 0

## 2016-09-02 LAB — BPAM RBC
BLOOD PRODUCT EXPIRATION DATE: 201805312359
BLOOD PRODUCT EXPIRATION DATE: 201806072359
ISSUE DATE / TIME: 201805101407
ISSUE DATE / TIME: 201805120848
Unit Type and Rh: 5100
Unit Type and Rh: 5100

## 2016-09-02 LAB — CBC
HCT: 33.4 % — ABNORMAL LOW (ref 36.0–46.0)
HEMOGLOBIN: 11.1 g/dL — AB (ref 12.0–15.0)
MCH: 29.8 pg (ref 26.0–34.0)
MCHC: 33.2 g/dL (ref 30.0–36.0)
MCV: 89.5 fL (ref 78.0–100.0)
PLATELETS: 155 10*3/uL (ref 150–400)
RBC: 3.73 MIL/uL — ABNORMAL LOW (ref 3.87–5.11)
RDW: 14.6 % (ref 11.5–15.5)
WBC: 6.8 10*3/uL (ref 4.0–10.5)

## 2016-09-02 LAB — BASIC METABOLIC PANEL
Anion gap: 10 (ref 5–15)
BUN: 18 mg/dL (ref 6–20)
CALCIUM: 9.4 mg/dL (ref 8.9–10.3)
CO2: 19 mmol/L — ABNORMAL LOW (ref 22–32)
Chloride: 110 mmol/L (ref 101–111)
Creatinine, Ser: 1.32 mg/dL — ABNORMAL HIGH (ref 0.44–1.00)
GFR, EST AFRICAN AMERICAN: 42 mL/min — AB (ref >60)
GFR, EST NON AFRICAN AMERICAN: 36 mL/min — AB (ref >60)
Glucose, Bld: 99 mg/dL (ref 65–99)
Potassium: 3.8 mmol/L (ref 3.5–5.1)
SODIUM: 139 mmol/L (ref 135–145)

## 2016-09-02 SURGERY — ESOPHAGOGASTRODUODENOSCOPY (EGD) WITH PROPOFOL
Anesthesia: Monitor Anesthesia Care

## 2016-09-02 MED ORDER — METOPROLOL TARTRATE 12.5 MG HALF TABLET
12.5000 mg | ORAL_TABLET | Freq: Two times a day (BID) | ORAL | Status: DC
  Administered 2016-09-02: 12.5 mg via ORAL
  Filled 2016-09-02: qty 1

## 2016-09-02 MED ORDER — MEGESTROL ACETATE 40 MG PO TABS: 40.0000 mg | ORAL_TABLET | Freq: Every day | ORAL | 0 refills | Status: DC

## 2016-09-02 MED ORDER — CLOPIDOGREL BISULFATE 75 MG PO TABS: 75.0000 mg | ORAL_TABLET | Freq: Every day | ORAL | 6 refills | Status: DC

## 2016-09-02 MED ORDER — LIDOCAINE 2% (20 MG/ML) 5 ML SYRINGE
INTRAMUSCULAR | Status: DC | PRN
  Administered 2016-09-02: 60 mg via INTRAVENOUS

## 2016-09-02 MED ORDER — SODIUM CHLORIDE 0.9 % IV SOLN
INTRAVENOUS | Status: DC
  Administered 2016-09-02: 08:00:00 via INTRAVENOUS

## 2016-09-02 MED ORDER — FERROUS SULFATE 325 (65 FE) MG PO TABS: 325.0000 mg | ORAL_TABLET | Freq: Every day | ORAL | 3 refills | Status: AC

## 2016-09-02 MED ORDER — ANGIOPLASTY BOOK: 1.0000 | Freq: Once | 0 refills | Status: AC

## 2016-09-02 MED ORDER — PROPOFOL 500 MG/50ML IV EMUL
INTRAVENOUS | Status: DC | PRN
  Administered 2016-09-02: 150 ug/kg/min via INTRAVENOUS

## 2016-09-02 MED ORDER — NITROGLYCERIN 0.4 MG SL SUBL: 0.4000 mg | SUBLINGUAL_TABLET | SUBLINGUAL | 12 refills | Status: DC | PRN

## 2016-09-02 MED ORDER — PAROXETINE HCL 30 MG PO TABS: 30.0000 mg | ORAL_TABLET | Freq: Every day | ORAL | 0 refills | Status: DC

## 2016-09-02 MED ORDER — OMEPRAZOLE 40 MG PO CPDR: 40.0000 mg | DELAYED_RELEASE_CAPSULE | Freq: Two times a day (BID) | ORAL | 0 refills | Status: DC

## 2016-09-02 SURGICAL SUPPLY — 14 items

## 2016-09-02 NOTE — Discharge Summary (Signed)
Discharge Summary    Patient ID: Anna Prince,  MRN: [id number], DOB/AGE: 1931-08-23 83 y.o.  Admit date: 08/28/2016 Discharge date: 09/02/2016  Primary Care Provider: Crecencio Mc Primary Cardiologist: Nelva Bush MD  Discharge Diagnoses    Active Problems:   Hypertension   Aortic aneurysm, abdominal (HCC)   COPD (chronic obstructive pulmonary disease) (HCC)   GERD (gastroesophageal reflux disease)   Non-ST elevation (NSTEMI) myocardial infarction [facility])   Coronary artery disease   Allergies Allergies  Allergen Reactions  . Morphine Sulfate Other (See Comments)    Reaction:  Hallucinations     Diagnostic Studies/Procedures    Cardiac Catheterization 08/27/2016 Conclusion     Ost LM lesion, 15 %stenosed.  Prox LAD lesion, 99 %stenosed.  Ost 1st Diag lesion, 99 %stenosed.  Mid RCA to Dist RCA lesion, 85 %stenosed.  Ost RPDA to RPDA lesion, 100 %stenosed.  The left ventricular ejection fraction is 50-55% by visual estimate.  There is mild left ventricular systolic dysfunction.  LV end diastolic pressure is normal.  There is no aortic valve stenosis.   Bifurcating proximal lad and d1 with 95-99% proxiimal lad and D1. Heavily calcified left main and proximal lad. 60-70% ostial lcx. Diffusely diseased rca with 100% pd with collaterals from the left.  Preserved LV funciton Tortuous aorta.  Very high risk PCI lesions will need consideration for cabg.     PCI 08/31/2016  Conclusion   Conclusions: 1. Severe proximal LAD disease involving first diagonal branch with heavy calcification. 2. Successful orbital atherectomy and PCI to proximal LAD with placement of a Synergy 3.0 x 20 mm drug-eluting stent (post dilated with 3.5 mm [place] balloon) with 0% residual stenosis and TIMI-3 flow. 3. Moderate-caliber D1 branch became occluded at its ostium during orbital atherectomy. Given lack of chest pain and collateral filling of the branch, decision was made  not to intervene on this vessel.  Recommendations: 1. Dual antiplatelet therapy with aspirin and clopidogrel for at least 12 months, if tolerated, ideally longer. 2. Aggressive secondary prevention.   Echocardiogram 08/25/2016 Left ventricle: Wall thickness was increased in a pattern of mild   LVH. Systolic function was normal. The estimated ejection   fraction was in the range of 50% to 55%. Doppler parameters are   consistent with abnormal left ventricular relaxation (grade 1   diastolic dysfunction). - Aortic valve: Valve area (Vmax): 1.86 cm^2. - Mitral valve: There was mild regurgitation. - Right atrium: The atrium was mildly dilated.  09/02/2017 ESOPHAGOGASTRODUODENOSCOPY (EGD) WITH PROPOFOL (N/A)  SURGEON:  Surgeon(s) and Role:    * Brahmbhatt, Parag, MD - Primary  Findings/recommendations ------------------------------------ - EGD showed large hiatal hernia multiple Cameron erosions and ulcer. No evidence of active bleeding. It also showed gastritis. Biopsy was performed for H. pylori. - Recommend IV twice a day PPI while patient is in the hospital. - Change to by mouth twice a day PPI on discharge. She needs to continue twice a day PPI for at least 8 weeks followed by once a day.    _____________   History of Present Illness     Anna Prince is an 83 year old female with past medical history of tobacco abuse, hypertension, hyperlipidemia, aortic aneurysm (with repair in July 2010, and COPD. She was admitted from her primary care physician's office with complaints of intermittent chest pain which occurred for 3 months. In the office her primary care physician did an EKG which showed a new Q-wave in lead III. who was admitted  to [facility], and was found to have elevated troponin at 1.15. Patient also was found to have hypertension, with a blood pressure 193/160. The patient was started on beta blocker, ACE inhibitor, given IV hydralazine when necessary.  Hospital  Course     Consultants: Gastroenterology - Dr. Rozelle Logan MD                      CVTS: Dr. Nils Pyle  The patient, had cardiac catheterization at [facility] by Dr. Ubaldo Glassing, on 08/27/2016. The results are as above, she is on have a proximal 99% lesion, also 99% stenosis first diagonal, 85% stenosis mid to distal RCA, ostial RPDA 100% stenosed. The patient was transferred to [place] under the service of Dr.Van Tright to be considered for coronary artery bypass grafting.   The patient was seen by Dr. Nils Pyle , long discussion was had with the family and the patient, afterwards patient refused bypass grafting, and preferred to have PCI if this was possible. Dr. Nils Pyle signed the patient over to cardiology after the patient refused CABG.  The patient was then seen by Dr. Sherren Mocha, interventional cardiologist, with long discussion with patient and family. The patient elected to remove 4 with a high-risk PCI in the setting of critical stenosis at the LAD, diagonal bifurcation, and with atherectomy.   It was noted that the patient was anemic on arrival, with a hemoglobin of 8.6. This was found be related to iron deficiency anemia chronic disease, infrequent lobotomy. There are no signs of active bleeding, and therefore interventional cardiac catheterization was planned.  On 08/31/2016, the patient underwent interventional cardiac catheterization with Dr. Saunders Revel, which revealed severe proximal LAD stenosis involving diagonal one. A successful orbital atherectomy and PCI to the proximal LAD with 0% residual stenosis and TIMI 3 flow was obtained. The ostial diagonal one became occluded during atherectomy of the proximal LAD, but given the TIMI 2 flow at the start of the case with collateral filling and lack of chest pain intervention to the diagonal one was not attempted.  She was continued on dual antiplatelet therapy with aspirin and Plavix for a minimum of 3 months, but up to 12 months  is recommended.   She was followed by cardiology postprocedure, and was found to have positive fecal occult blood, with a hemoglobin dropping to 7.6. Gastroenterology was consulted. She was seen by Dr. Alessandra Bevels. An EGD was planned. This revealed large hiatal hernia, multiple Cameron erosions and an ulcer. There was no evidence of active bleeding. She was also found to have gastritis. Biopsy was performed. She was started on PPI, twice a day while hospitalized in transition to twice a day on discharge. She is to follow-up with GI in [place] after discharge.   The patient was seen and examined by Dr. Bronson Ing on day of discharge. She was asymptomatic. He discussed with GI about discharge, and they were in agreement that she was stable. She denied chest pain shortness of breath or abdominal pain. She was counseled on smoking cessation, continued on Protonix 40 mg twice a day, dual antiplatelet therapy, and statin therapy. She will follow-up with Dr. Saunders Revel  in [place].  _____________  Discharge Vitals Blood pressure (!) 156/76, pulse 69, temperature 98.7 F (37.1 C), temperature source Oral, resp. rate 20, height 5\' 5"  (1.651 m), weight 110 lb (49.9 kg), SpO2 96 %.  Filed Weights   09/01/16 1235 09/02/16 0456 09/02/16 0916  Weight: 115 lb 14.4 oz (52.6  kg) 110 lb 14.4 oz (50.3 kg) 110 lb (49.9 kg)    Labs & Radiologic Studies     CBC  Recent Labs  09/01/16 0450 09/02/16 0616  WBC 5.3 6.8  HGB 7.6* 11.1*  HCT 23.7* 33.4*  MCV 90.8 89.5  PLT 128* 841   Basic Metabolic Panel  Recent Labs  09/01/16 0450 09/02/16 0616  NA 137 139  K 4.3 3.8  CL 110 110  CO2 20* 19*  GLUCOSE 105* 99  BUN 27* 18  CREATININE 1.30* 1.32*  CALCIUM 8.7* 9.4   Dg Chest 2 View  Result Date: 08/24/2016 CLINICAL DATA:  Abnormal electrocardiogram.  Chest pain EXAM: CHEST  2 VIEW COMPARISON:  November 11, 2015 FINDINGS: There is no edema or consolidation. Heart size and pulmonary vascularity are within  normal limits. There is aortic atherosclerosis. There is left anterior descending coronary artery calcification. No adenopathy. There is thoracolumbar levoscoliosis. IMPRESSION: Aortic atherosclerosis. Left anterior descending coronary artery calcification. Scoliosis. No edema or consolidation. Electronically Signed   By: Lowella Grip III M.D.   On: 08/24/2016 15:22    Disposition   Pt is being discharged home today in good condition.  Follow-up Plans & Appointments    Follow-up Information    End, Harrell Gave, MD Follow up.   Specialty:  Cardiology Why:  Our office will call you for appointment. If you do not hear from them by 12 pm on Monday please call Contact information: [place] [address] [phone]        [facility] Follow up.   Specialty:  Gastroenterology Why:  Please call them for follow up appointment  Contact information: [address] [place] [address] [phone]         Discharge Instructions    AMB Referral to Phase II Cardiac Rehab    Complete by:  As directed    Diagnosis:  Coronary Stents   Amb Referral to Cardiac Rehabilitation    Complete by:  As directed    Diagnosis:  Coronary Stents   Call MD for:  difficulty breathing, headache or visual disturbances    Complete by:  As directed    Call MD for:  persistant dizziness or light-headedness    Complete by:  As directed    Call MD for:  persistant nausea and vomiting    Complete by:  As directed    Call MD for:  redness, tenderness, or signs of infection (pain, swelling, redness, odor or green/yellow discharge around incision site)    Complete by:  As directed    Call MD for:  severe uncontrolled pain    Complete by:  As directed    Diet - low sodium heart healthy    Complete by:  As directed    Increase activity slowly    Complete by:  As directed       Discharge Medications   Current Discharge  Medication List    START taking these medications   Details  angioplasty book MISC 1 each by Does not apply route once. Qty: 1 each, Refills: 0    clopidogrel (PLAVIX) 75 MG tablet Take 1 tablet (75 mg total) by mouth daily. Qty: 30 tablet, Refills: 6    ferrous sulfate 325 (65 FE) MG tablet Take 1 tablet (325 mg total) by mouth daily with breakfast. Qty: 30 tablet, Refills: 3    megestrol (MEGACE) 40 MG tablet Take 1 tablet (40 mg total) by  mouth daily. Qty: 30 tablet, Refills: 0    nitroGLYCERIN (NITROSTAT) 0.4 MG SL tablet Place 1 tablet (0.4 mg total) under the tongue every 5 (five) minutes x 3 doses as needed for chest pain. Qty: 30 tablet, Refills: 12    PARoxetine (PAXIL) 30 MG tablet Take 1 tablet (30 mg total) by mouth daily. To be refilled by PCP Qty: 30 tablet, Refills: 0      CONTINUE these medications which have CHANGED   Details  omeprazole (PRILOSEC) 40 MG capsule Take 1 capsule (40 mg total) by mouth 2 (two) times daily. Qty: 90 capsule, Refills: 0      CONTINUE these medications which have NOT CHANGED   Details  albuterol (PROVENTIL HFA;VENTOLIN HFA) 108 (90 Base) MCG/ACT inhaler Inhale 2 puffs into the lungs every 6 (six) hours as needed for wheezing or shortness of breath. Qty: 1 Inhaler, Refills: 2    aspirin EC 81 MG EC tablet Take 1 tablet (81 mg total) by mouth daily.    atorvastatin (LIPITOR) 40 MG tablet Take 1 tablet (40 mg total) by mouth daily at 6 PM. Qty: 30 tablet, Refills: 0    budesonide-formoterol (SYMBICORT) 80-4.5 MCG/ACT inhaler Inhale 2 puffs into the lungs 2 (two) times daily. Qty: 1 Inhaler, Refills: 12    diazepam (VALIUM) 5 MG tablet Take 0.5 tablets (2.5 mg total) by mouth 2 (two) times daily as needed. Qty: 30 tablet, Refills: 5    HYDROcodone-acetaminophen (NORCO/VICODIN) 5-325 MG tablet Take 1 tablet by mouth every 6 (six) hours as needed for moderate pain. Maximum 2 daily. Qty: 62 tablet, Refills: 0    lisinopril  (PRINIVIL,ZESTRIL) 20 MG tablet TAKE 1 TABLET BY MOUTH EVERY DAY Qty: 90 tablet, Refills: 2    metoprolol tartrate (LOPRESSOR) 25 MG tablet Take 0.5 tablets (12.5 mg total) by mouth 2 (two) times daily. Qty: 60 tablet, Refills: 0    mirtazapine (REMERON) 45 MG tablet Take 1 tablet (45 mg total) by mouth at bedtime. Qty: 90 tablet, Refills: 5         Aspirin prescribed at discharge?  Yes  High Intensity Statin Prescribed?Yes Beta Blocker Prescribed? Yes For EF 45% or less, Was ACEI/ARB Prescribed? Yes ADP Receptor Inhibitor Prescribed? Yes Was EF assessed during THIS hospitalization? Yes Was Cardiac Rehab II ordered? Yes  Outstanding Labs/Studies    Duration of Discharge Encounter   Greater than 30 minutes including physician time.  Signed, Phill Myron. West Pugh, ANP, AACC   09/02/2016, 1:37 PM

## 2016-09-02 NOTE — Op Note (Signed)
[facility] Patient Name: Anna Prince Procedure Date : 09/02/2016 MRN: [id number] Attending MD: Otis Brace , MD Date of Birth: 08-02-1931 CSN: [id number] Age: 83 Admit Type: Inpatient Procedure:                Upper GI endoscopy Indications:              Heme positive stool, Melena Providers:                Otis Brace, MD, Carolynn Comment, RN, Corliss Parish, Technician Referring MD:              Medicines:                Sedation Administered by an Anesthesia Professional Complications:            No immediate complications. Estimated Blood Loss:     Estimated blood loss was minimal. Procedure:                Pre-Anesthesia Assessment:                           - Prior to the procedure, a History and Physical                            was performed, and patient medications and                            allergies were reviewed. The patient's tolerance of                            previous anesthesia was also reviewed. The risks                            and benefits of the procedure and the sedation                            options and risks were discussed with the patient.                            All questions were answered, and informed consent                            was obtained. Prior Anticoagulants: The patient has                            taken Plavix (clopidogrel). ASA Grade Assessment:                            III - A patient with severe systemic disease. After                            reviewing the risks and benefits, the patient was  deemed in satisfactory condition to undergo the                            procedure.                           After obtaining informed consent, the endoscope was                            passed under direct vision. Throughout the                            procedure, the patient's blood pressure, pulse, and   oxygen saturations were monitored continuously. The                            Endoscope was introduced through the mouth, and                            advanced to the second part of duodenum. The upper                            GI endoscopy was accomplished without difficulty.                            The patient tolerated the procedure well. Scope In: Scope Out: Findings:      A non-obstructing Schatzki ring (acquired) was found at the       gastroesophageal junction.      A large 8 CM hiatal hernia with multiple Cameron ulcers was found. The       hiatal narrowing was 40 cm from the incisors. The Z-line was 32 cm from       the incisors. There was no evidence of active bleeding.      Scattered mild inflammation characterized by congestion (edema),       erosions, erythema, friability and linear erosions was found in the       entire examined stomach. Biopsies were taken with a cold forceps for       histology.      A large hiatal hernia was present.      The duodenal bulb, first portion of the duodenum and second portion of       the duodenum were normal. Impression:               - Non-obstructing Schatzki ring.                           - Large hiatal hernia with multiple Cameron ulcers.                           - Gastritis. Biopsied.                           - Large hiatal hernia.                           - Normal duodenal bulb, first portion of the  duodenum and second portion of the duodenum. Moderate Sedation:      Moderate (conscious) sedation was personally administered by an       anesthesia professional. The following parameters were monitored: oxygen       saturation, heart rate, blood pressure, and response to care. Recommendation:           - Return patient to hospital ward for ongoing care.                           - Cardiac diet.                           - Continue present medications.                           - Await pathology  results. Procedure Code(s):        --- Professional ---                           [phone], Esophagogastroduodenoscopy, flexible,                            transoral; with biopsy, single or multiple Diagnosis Code(s):        --- Professional ---                           K22.2, Esophageal obstruction                           K25.9, Gastric ulcer, unspecified as acute or                            chronic, without hemorrhage or perforation                           K44.9, Diaphragmatic hernia without obstruction or                            gangrene                           K29.70, Gastritis, unspecified, without bleeding                           R19.5, Other fecal abnormalities                           K92.1, Melena (includes Hematochezia) CPT copyright 2016 American Medical Association. All rights reserved. The codes documented in this report are preliminary and upon coder review may  be revised to meet current compliance requirements. Otis Brace, MD Otis Brace, MD 09/02/2016 10:12:29 AM Number of Addenda: 0

## 2016-09-02 NOTE — Progress Notes (Signed)
Patient received all discharge information and education. Patient verbalized understanding. 

## 2016-09-02 NOTE — Progress Notes (Signed)
[facility] Progress Note  Anna Prince 83 y.o. December 07, 1931  CC:  Epigastric pain, melena, occult blood positive stool, anemia   Subjective: Patient was transferred to [address] yesterday. Complaining of weakness. Denied any bowel movement today. Epigastric pain is improving. Denied any nausea or vomiting  ROS : Denied any active chest pain or shortness of breath.   Objective: Vital signs in last 24 hours: Vitals:   09/02/16 0456 09/02/16 0728  BP: 136/70 (!) 141/59  Pulse: 78 64  Resp: 18 19  Temp: 98 F (36.7 C) 98.9 F (37.2 C)    Physical Exam:  General:   Alert,  Well-developed, well-nourished, pleasant and cooperative in NAD HEENT : Normocephalic, atraumatic, extraocular movement intact. Oropharynx clear and moist. Sclerae anicteric Lungs:  Clear throughout to auscultation.   No wheezes, crackles, or rhonchi. No acute distress. Heart:  Regular rate and rhythm; no murmurs, clicks, rubs,  or gallops. Abdomen: Epigastric tenderness to palpation without any peritoneal signs. Soft, nondistended, bowel sounds present. No organomegaly appreciated. LE: no edema    Lab Results:  Recent Labs  08/31/16 0534 09/01/16 0450  NA 138 137  K 4.2 4.3  CL 110 110  CO2 19* 20*  GLUCOSE 99 105*  BUN 27* 27*  CREATININE 1.36* 1.30*  CALCIUM 9.0 8.7*   No results for input(s): AST, ALT, ALKPHOS, BILITOT, PROT, ALBUMIN in the last 72 hours.  Recent Labs  08/31/16 0331 09/01/16 0450  WBC 6.0 5.3  HGB 8.6* 7.6*  HCT 27.4* 23.7*  MCV 89.3 90.8  PLT 142* 128*    Recent Labs  08/31/16 0534  LABPROT 14.2  INR 1.09      Assessment/Plan: - Epigastric pain and melena/ Occult blood positive stool  in setting of dual antiplatelet therapy. Need to rule out ulcer disease. - Acute on chronic blood loss anemia - Occult blood positive stool -  Multi-vessel coronary artery disease status post complex PCI with stent placement  yesterday.  Recommendations ------------------------ - Repeat CBC pending this morning. Patient with no further bowel movement today. EGD today with anesthesia. Risks benefits discussed with the patient. Verbalized understanding. Patient had a colonoscopy in March 2016 at [facility]. Continue PPI for now. Monitor hemoglobin. Further plan based on endoscopic finding.   Otis Brace MD, FACP 09/02/2016, 8:10 AM  Pager [phone]  If no answer or after 5 PM call [phone]

## 2016-09-02 NOTE — Transfer of Care (Signed)
Immediate Anesthesia Transfer of Care Note  Patient: Anna Prince  Procedure(s) Performed: Procedure(s): ESOPHAGOGASTRODUODENOSCOPY (EGD) WITH PROPOFOL (N/A)  Patient Location: Endoscopy Unit  Anesthesia Type:MAC  Level of Consciousness: awake, alert , oriented and patient cooperative  Airway & Oxygen Therapy: Patient Spontanous Breathing and Patient connected to nasal cannula oxygen  Post-op Assessment: Report given to RN, Post -op Vital signs reviewed and stable and Patient moving all extremities  Post vital signs: Reviewed and stable  Last Vitals:  Vitals:   09/02/16 0916 09/02/16 1010  BP: 131/73 (!) 134/59  Pulse:  60  Resp: 20 18  Temp: 36.9 C     Last Pain:  Vitals:   09/02/16 0916  TempSrc: Oral  PainSc:          Complications: No apparent anesthesia complications

## 2016-09-02 NOTE — Progress Notes (Signed)
Progress Note  Patient Name: Anna Prince Date of Encounter: 09/02/2016  Primary Cardiologist: End  Subjective   Denies chest pain, shortness of breath, and abdominal pain. I spoke with GI and they feel she can be discharged from their standpoint. Hgb up to 11.1 from 7.6 after transfusion.  Inpatient Medications    Scheduled Meds: . angioplasty book   Does not apply Once  . aspirin EC  81 mg Oral Daily  . atorvastatin  40 mg Oral q1800  . clopidogrel  75 mg Oral Daily  . ferrous sulfate  325 mg Oral Q breakfast  . megestrol  40 mg Oral Daily  . mirtazapine  45 mg Oral QHS  . mometasone-formoterol  2 puff Inhalation BID  . pantoprazole (PROTONIX) IV  40 mg Intravenous Q12H  . PARoxetine  30 mg Oral Daily  . sodium chloride flush  3 mL Intravenous Q12H  . sodium chloride flush  3 mL Intravenous Q12H   Continuous Infusions: . sodium chloride    . sodium chloride    . sodium chloride     PRN Meds: sodium chloride, sodium chloride, acetaminophen, diazepam, docusate sodium, HYDROcodone-acetaminophen, magnesium hydroxide, nitroGLYCERIN, ondansetron (ZOFRAN) IV, sodium chloride flush, sodium chloride flush   Vital Signs    Vitals:   09/02/16 1020 09/02/16 1030 09/02/16 1057 09/02/16 1128  BP: (!) 140/55 140/75  (!) 156/76  Pulse: 62 62  66  Resp: 20 20  20   Temp:    98.7 F (37.1 C)  TempSrc:    Oral  SpO2: 100% 100% 94% 96%  Weight:      Height:        Intake/Output Summary (Last 24 hours) at 09/02/16 1154 Last data filed at 09/02/16 1005  Gross per 24 hour  Intake           571.67 ml  Output             1350 ml  Net          -778.33 ml   Filed Weights   09/01/16 1235 09/02/16 0456 09/02/16 0916  Weight: 115 lb 14.4 oz (52.6 kg) 110 lb 14.4 oz (50.3 kg) 110 lb (49.9 kg)    Telemetry    NSR - Personally Reviewed  ECG     - Personally Reviewed  Physical Exam   GEN: No acute distress.   Neck: No JVD Cardiac: RRR, no murmurs, rubs, or gallops.    Respiratory: Clear to auscultation bilaterally. GI: Soft, nontender, non-distended  MS: No edema; No deformity. Neuro:  Nonfocal  Psych: Normal affect   Labs    Chemistry Recent Labs Lab 08/29/16 0320 08/31/16 0534 09/01/16 0450 09/02/16 0616  NA 138 138 137 139  K 4.1 4.2 4.3 3.8  CL 110 110 110 110  CO2 20* 19* 20* 19*  GLUCOSE 130* 99 105* 99  BUN 28* 27* 27* 18  CREATININE 1.36* 1.36* 1.30* 1.32*  CALCIUM 8.8* 9.0 8.7* 9.4  PROT 5.4*  --   --   --   ALBUMIN 3.0*  --   --   --   AST 19  --   --   --   ALT 15  --   --   --   ALKPHOS 53  --   --   --   BILITOT 0.3  --   --   --   GFRNONAA 35* 35* 37* 36*  GFRAA 41* 41* 43* 42*  ANIONGAP 8 9 7  10  Hematology Recent Labs Lab 08/31/16 0331 09/01/16 0450 09/02/16 0616  WBC 6.0 5.3 6.8  RBC 3.07* 2.61* 3.73*  HGB 8.6* 7.6* 11.1*  HCT 27.4* 23.7* 33.4*  MCV 89.3 90.8 89.5  MCH 28.0 29.1 29.8  MCHC 31.4 32.1 33.2  RDW 14.6 14.7 14.6  PLT 142* 128* 155    Cardiac EnzymesNo results for input(s): TROPONINI in the last 168 hours. No results for input(s): TROPIPOC in the last 168 hours.   BNPNo results for input(s): BNP, PROBNP in the last 168 hours.   DDimer No results for input(s): DDIMER in the last 168 hours.   Radiology    No results found.  Cardiac Studies   08/31/16:  Conclusions: 1. Severe proximal LAD disease involving first diagonal branch with heavy calcification. 2. Successful orbital atherectomy and PCI to proximal LAD with placement of a Synergy 3.0 x 20 mm drug-eluting stent (post dilated with 3.5 mm Aldine balloon) with 0% residual stenosis and TIMI-3 flow. 3. Moderate-caliber D1 branch became occluded at its ostium during orbital atherectomy. Given lack of chest pain and collateral filling of the branch, decision was made not to intervene on this vessel.  Recommendations: 1. Dual antiplatelet therapy with aspirin and clopidogrel for at least 12 months, if tolerated, ideally  longer. 2. Aggressive secondary prevention.  Patient Profile     83 y.o. female with ongoing tobacco abuse, HTN, HLD, CKD III, aortic aneurysm s/p repair (2010) and COPD who was [facility] to [facility] 08/25/16 with chest pain, NSTEMI, hypertensive urgency. LHC 08/24/16 showed LVEF 50-55%, proximal LAD 99%, ostial 1st diagonal 99% stenosed, mid to distal RCA 85% stenosed, and ostial RPDA 100 %stenosed. Seen by CVTS - patient refused bypass surgery, so PCI pursued. Also found to have acute on chronic anemia with hemoglobin nadir 7.6. On 08/31/16, underwent orbital atherectomy/DES to prox LAD - moderate-caliber D1 branch became occluded at its ostium during orbital atherectomy. Given lack of chest pain and collateral filling of the branch, decision was made not to intervene on this vessel.  Assessment & Plan     1. CAD/NSTEMI - patient refused bypass, underwent high risk PCI on 08/31/16. Continue ASA, Lipitor, and Plavix. I will resume metoprolol.  2. Iron deficiency anemia - EGD showed large hiatal hernia with multiple Cameron erosions and non-bleeding ulcer. She will be discharged on Protonix 40 mg bid and needs fu with GI in [place]. Received 1 unit PRBC and Hgb up to 11.1 from 7.6    3. HTN - mildly elevated this morning. I will resume metoprolol.  4. COPD with ongoing tobacco abuse - patient has decided based on age/life expectancy she has no interest in quitting.  5. CKD stage III - stable post-cath.   6. Hyperlipidemia - continue statin.  Dispo: She will be discharged today.  Signed, Kate Sable, MD  09/02/2016, 11:54 AM

## 2016-09-02 NOTE — Brief Op Note (Signed)
08/28/2016 - 09/02/2016  10:13 AM  PATIENT:  Anna Prince  83 y.o. female  PRE-OPERATIVE DIAGNOSIS:  Melena/ GI bleed  POST-OPERATIVE DIAGNOSIS:  Cameron ulcer in the hiatal hernia; biopsies for h pylori  PROCEDURE:  Procedure(s): ESOPHAGOGASTRODUODENOSCOPY (EGD) WITH PROPOFOL (N/A)  SURGEON:  Surgeon(s) and Role:    * Raygen Dahm, MD - Primary  Findings/recommendations ------------------------------------ - EGD showed large hiatal hernia multiple Cameron erosions and ulcer. No evidence of active bleeding. It also showed gastritis. Biopsy was performed for H. pylori. - Recommend IV twice a day PPI while patient is in the hospital. - Change to by mouth twice a day PPI on discharge. She needs to continue twice a day PPI for at least 8 weeks followed by once a day.  - She needs to follow-up with primary GI in [place] after discharge - GI will sign off. Call us back if needed

## 2016-09-02 NOTE — Anesthesia Preprocedure Evaluation (Addendum)
Anesthesia Evaluation  Patient identified by MRN, date of birth, ID band Patient awake    Reviewed: Allergy & Precautions, H&P , NPO status , Patient's Chart, lab work & pertinent test results, reviewed documented beta blocker date and time   Airway Mallampati: II  TM Distance: >3 FB Neck ROM: Full    Dental no notable dental hx. (+) Upper Dentures, Partial Lower, Dental Advisory Given   Pulmonary COPD,  COPD inhaler, Current Smoker,    Pulmonary exam normal breath sounds clear to auscultation       Cardiovascular hypertension, Pt. on medications and Pt. on home beta blockers + CAD, + Past MI, + Cardiac Stents and + Peripheral Vascular Disease   Rhythm:Regular Rate:Normal     Neuro/Psych Anxiety Depression negative neurological ROS     GI/Hepatic Neg liver ROS, GERD  Medicated and Controlled,  Endo/Other  negative endocrine ROS  Renal/GU negative Renal ROS  negative genitourinary   Musculoskeletal  (+) Arthritis , Osteoarthritis,    Abdominal   Peds  Hematology negative hematology ROS (+) anemia ,   Anesthesia Other Findings   Reproductive/Obstetrics negative OB ROS                            Anesthesia Physical Anesthesia Plan  ASA: III  Anesthesia Plan: MAC   Post-op Pain Management:    Induction: Intravenous  Airway Management Planned: Nasal Cannula  Additional Equipment:   Intra-op Plan:   Post-operative Plan:   Informed Consent: I have reviewed the patients History and Physical, chart, labs and discussed the procedure including the risks, benefits and alternatives for the proposed anesthesia with the patient or authorized representative who has indicated his/her understanding and acceptance.   Dental advisory given  Plan Discussed with: CRNA  Anesthesia Plan Comments:         Anesthesia Quick Evaluation

## 2016-09-02 NOTE — Anesthesia Postprocedure Evaluation (Signed)
Anesthesia Post Note  Patient: [name]  Procedure(s) Performed: Procedure(s) (LRB): ESOPHAGOGASTRODUODENOSCOPY (EGD) WITH PROPOFOL (N/A)  Patient location during evaluation: PACU Anesthesia Type: MAC Level of consciousness: awake and alert Pain management: pain level controlled Vital Signs Assessment: post-procedure vital signs reviewed and stable Respiratory status: spontaneous breathing, nonlabored ventilation, respiratory function stable and patient connected to nasal cannula oxygen Cardiovascular status: stable and blood pressure returned to baseline Anesthetic complications: no       Last Vitals:  Vitals:   09/02/16 1030 09/02/16 1128  BP: 140/75 (!) 156/76  Pulse: 62 66  Resp: 20 20  Temp:  37.1 C    Last Pain:  Vitals:   09/02/16 1128  TempSrc: Oral  PainSc:                  Derik Fults,W. EDMOND

## 2016-09-03 ENCOUNTER — Telehealth: Payer: Self-pay | Admitting: Internal Medicine

## 2016-09-03 ENCOUNTER — Encounter (HOSPITAL_COMMUNITY): Payer: Self-pay | Admitting: Internal Medicine

## 2016-09-03 NOTE — Telephone Encounter (Signed)
Transition Care Management Follow-up Telephone Call  How have you been since you were released from the hospital? Patient daughter no complaints, doing better.   Do you understand why you were in the hospital? {yes   Do you understand the discharge instrcutions? {Yes  Items Reviewed:  Medications reviewed:Yes added Nitro and added Plavix.  Allergies reviewed: yes  Dietary changes reviewed: Yes heart healthy  Referrals reviewed: Yes   Functional Questionnaire:   Activities of Daily Living (ADLs):   She states they are independent in the following: No assist needed. States they require assistance with the following:No assist needed.  Any transportation issues/concerns?: No Any patient concerns? No  Confirmed importance and date/time of follow-up visits scheduled: yes.  Confirmed with patient if condition begins to worsen call PCP or go to the ER.  Patient was given the Call-a-Nurse line [phone]: Yes.

## 2016-09-05 ENCOUNTER — Ambulatory Visit (INDEPENDENT_AMBULATORY_CARE_PROVIDER_SITE_OTHER): Payer: Medicare Other | Admitting: Internal Medicine

## 2016-09-05 ENCOUNTER — Encounter: Payer: Self-pay | Admitting: Internal Medicine

## 2016-09-05 VITALS — BP 142/68 | HR 67 | Ht 65.0 in | Wt 122.0 lb

## 2016-09-05 DIAGNOSIS — I10 Essential (primary) hypertension: Secondary | ICD-10-CM

## 2016-09-05 DIAGNOSIS — K2970 Gastritis, unspecified, without bleeding: Secondary | ICD-10-CM

## 2016-09-05 DIAGNOSIS — I25118 Atherosclerotic heart disease of native coronary artery with other forms of angina pectoris: Secondary | ICD-10-CM | POA: Diagnosis not present

## 2016-09-05 DIAGNOSIS — D500 Iron deficiency anemia secondary to blood loss (chronic): Secondary | ICD-10-CM

## 2016-09-05 DIAGNOSIS — K449 Diaphragmatic hernia without obstruction or gangrene: Secondary | ICD-10-CM

## 2016-09-05 DIAGNOSIS — I340 Nonrheumatic mitral (valve) insufficiency: Secondary | ICD-10-CM

## 2016-09-05 NOTE — Patient Instructions (Signed)
Medication Instructions:  Your physician recommends that you continue on your current medications as directed. Please refer to the Current Medication list given to you today.   Labwork: NONE  Testing/Procedures: NONE  Follow-Up: Your physician recommends that you schedule a follow-up appointment in: 2 MONTHS WITH DR END.  If you need a refill on your cardiac medications before your next appointment, please call your pharmacy.

## 2016-09-05 NOTE — Progress Notes (Signed)
Follow-up Outpatient Visit Date: 09/05/2016  Primary Care Provider: Crecencio Mc, MD [address] [place] [address]  Chief Complaint: Follow-up chest pain and NSTEMI with a recent high-risk PCI  HPI:  Anna Prince is a 83 y.o. year-old female with history of coronary artery disease, hypertension, hyperlipidemia, AAA, iron deficiency anemia, and COPD, who presents for follow-up of CAD status post high-risk PCI. She presented to [facility] on 08/24/16 with chest pain. Troponins were positive consistent with NSTEMI. Left heart catheterization revealed multivessel CAD, including heavily calcified high-grade stenosis of the proximal LAD involving a diagonal branch. She was referred to [facility] for CABG evaluation, though the patient declined any surgical intervention. Her hospitalization was complicated by acute on chronic renal insufficiency and significant anemia requiring blood transfusion. After discussing medical therapy versus high-risk PCI, the patient underwent orbital atherectomy and drug-eluting stent placement to the proximal LAD. The heavily diseased first diagonal branch was lost during the intervention, though the patient did not have any chest pain or her hemodynamic stability from the. She subsequently underwent EGD demonstrating a large hiatal hernia with Lysbeth Galas ulcers as well as gastritis. No active bleeding was identified. She was discharged home on 09/02/16.  Since returning home, Anna Prince has been feeling relatively well. She was a bit "draggy" yesterday but has not had any chest pain. She has mild shortness of breath that is stable. She denies bleeding. She also has not had any orthopnea, PND, or edema. She is tolerating her current medication regimen well. She wears 2 L of supplemental oxygen, predominantly at night, which she has been doing for quite some  time.  --------------------------------------------------------------------------------------------------  Cardiovascular History & Procedures: Cardiovascular Problems:  Coronary artery disease  Risk Factors:  Known CAD, hypertension, hyperlipidemia, and age greater than 66  Cath/PCI:  PCI (08/31/16): Severe proximal LAD disease involving T1. Successful orbital atherectomy PCI to proximal LAD with placement of Synergy 3.0 x 20 mm drug-eluting stent (post dilated to 3.5 mm) with 0% residual stenosis and TIMI-3 flow. Occlusion of D1 at its ostium. Given the lack of chest pain and collateral filling of the branch, intervention to T1 was not performed.  LHC (08/27/16): 15% ostial LMCA disease. 99% proximal LAD and D1 stenoses. Ramus in LCx normal. RCA with diffuse 80-90% mid and distal disease. PDA is occluded proximally and fills via left-to-right collaterals. LVEF 50-55%.  CV Surgery:  None  EP Procedures and Devices:  None  Non-Invasive Evaluation(s):  TTE (08/25/16): Mild LVH with LVEF of 50-55% and grade 1 diastolic dysfunction. Mild MR. Mild right atrial enlargement. Normal RV size and function.  Recent CV Pertinent Labs: Lab Results  Component Value Date   CHOL 134 08/25/2016   HDL 47 08/25/2016   LDLCALC 74 08/25/2016   LDLDIRECT 94.0 11/25/2014   TRIG 65 08/25/2016   CHOLHDL 2.9 08/25/2016   INR 1.09 08/31/2016   INR 1.0 04/17/2014   K 3.8 09/02/2016   K 3.6 04/20/2014   MG 1.7 05/03/2015   BUN 18 09/02/2016   BUN 9 04/20/2014   CREATININE 1.32 (H) 09/02/2016   CREATININE 0.94 04/20/2014   CREATININE 1.28 (H) 02/18/2012    Past medical and surgical history were reviewed and updated in EPIC.  Outpatient Encounter Prescriptions as of 09/05/2016  Medication Sig  . albuterol (PROVENTIL HFA;VENTOLIN HFA) 108 (90 Base) MCG/ACT inhaler Inhale 2 puffs into the lungs every 6 (six) hours as needed for wheezing or shortness of breath.  Marland Kitchen aspirin EC 81  MG EC tablet Take 1  tablet (81 mg total) by mouth daily.  Marland Kitchen atorvastatin (LIPITOR) 40 MG tablet Take 1 tablet (40 mg total) by mouth daily at 6 PM.  . budesonide-formoterol (SYMBICORT) 80-4.5 MCG/ACT inhaler Inhale 2 puffs into the lungs 2 (two) times daily.  . clopidogrel (PLAVIX) 75 MG tablet Take 1 tablet (75 mg total) by mouth daily.  . diazepam (VALIUM) 5 MG tablet Take 0.5 tablets (2.5 mg total) by mouth 2 (two) times daily as needed.  . ferrous sulfate 325 (65 FE) MG tablet Take 1 tablet (325 mg total) by mouth daily with breakfast.  . HYDROcodone-acetaminophen (NORCO/VICODIN) 5-325 MG tablet Take 1 tablet by mouth every 6 (six) hours as needed for moderate pain. Maximum 2 daily.  Marland Kitchen lisinopril (PRINIVIL,ZESTRIL) 20 MG tablet TAKE 1 TABLET BY MOUTH EVERY DAY  . megestrol (MEGACE) 40 MG tablet Take 1 tablet (40 mg total) by mouth daily.  . metoprolol tartrate (LOPRESSOR) 25 MG tablet Take 0.5 tablets (12.5 mg total) by mouth 2 (two) times daily.  . mirtazapine (REMERON) 45 MG tablet Take 1 tablet (45 mg total) by mouth at bedtime.  . nitroGLYCERIN (NITROSTAT) 0.4 MG SL tablet Place 1 tablet (0.4 mg total) under the tongue every 5 (five) minutes x 3 doses as needed for chest pain.  Marland Kitchen omeprazole (PRILOSEC) 40 MG capsule Take 1 capsule (40 mg total) by mouth 2 (two) times daily.  Marland Kitchen PARoxetine (PAXIL) 30 MG tablet Take 1 tablet (30 mg total) by mouth daily. To be refilled by PCP   No facility-administered encounter medications on file as of 09/05/2016.     Allergies: Morphine sulfate  Social History   Social History  . Marital status: Widowed    Spouse name: N/A  . Number of children: N/A  . Years of education: N/A   Occupational History  . Not on file.   Social History Main Topics  . Smoking status: Current Every Day Smoker    Packs/day: 0.50    Types: Cigarettes  . Smokeless tobacco: Never Used     Comment: HAS BEEN SMOKING 30+ YEARS,RESUMED TOBACCO USE AFTER AAA REPAIR  . Alcohol use No  . Drug  use: No  . Sexual activity: Not on file   Other Topics Concern  . Not on file   Social History Narrative  . No narrative on file    Family History  Problem Relation Age of Onset  . Coronary artery disease Mother   . Coronary artery disease Father 2  . Heart attack Father   . Cancer Neg Hx        no breast,coon or ovarian ca    Review of Systems: A 12-system review of systems was performed and was negative except as noted in the HPI.  --------------------------------------------------------------------------------------------------  Physical Exam: BP (!) 142/68 (BP Location: Right Arm, Patient Position: Sitting, Cuff Size: Normal)   Pulse 67   Ht '5\' 5"'  (1.651 m)   Wt 55.3 kg (122 lb)   BMI 20.30 kg/m   General:  Frail, elderly woman seated comfortably in a wheelchair. She is accompanied by HER-2 daughters. HEENT: Mild conjunctival pallor is noted. There is no scleral icterus..  Moist mucous membranes.  OP clear. Neck: Supple without lymphadenopathy, thyromegaly, JVD, or HJR. Lungs: Normal work of breathing.  Mildly diminished breath sounds throughout without wheezes or crackles. Heart: Regular rate and rhythm with 2/6 holosystolic murmur loudest at the left lower sternal border.  Non-displaced PMI. Abd: Bowel sounds present.  Soft, NT/ND without hepatosplenomegaly Ext: No lower extremity edema.  Radial, PT, and DP pulses are 2+ bilaterally. Right radial arteriotomy is well-healed. Skin: Warm and dry without rash. Multiple ecchymoses on both upper extremities.  EKG:  Normal sinus rhythm with LVH and nonspecific ST/T changes.  Lab Results  Component Value Date   WBC 6.8 09/02/2016   HGB 11.1 (L) 09/02/2016   HCT 33.4 (L) 09/02/2016   MCV 89.5 09/02/2016   PLT 155 09/02/2016    Lab Results  Component Value Date   NA 139 09/02/2016   K 3.8 09/02/2016   CL 110 09/02/2016   CO2 19 (L) 09/02/2016   BUN 18 09/02/2016   CREATININE 1.32 (H) 09/02/2016   GLUCOSE 99  09/02/2016   ALT 15 08/29/2016    Lab Results  Component Value Date   CHOL 134 08/25/2016   HDL 47 08/25/2016   LDLCALC 74 08/25/2016   LDLDIRECT 94.0 11/25/2014   TRIG 65 08/25/2016   CHOLHDL 2.9 08/25/2016   --------------------------------------------------------------------------------------------------  ASSESSMENT AND PLAN: Coronary artery disease with stable angina Anna Prince doing well following recent NSTEMI and high-risk PCI to the LAD. She has residual disease involving the RCA. She has not had any significant chest pain but continues to have her baseline dyspnea, that is likely multifactorial. We will continue her current medications. I would like to continue with dual antiplatelet therapy for up to a year, but at least 3 months of possible.  Hypertension Blood pressure is mildly elevated today. In the setting of her recent hospitalization and other ongoing medical issues, we will not make any changes to her regimen at this time.  Anemia I suspect the patient has had chronic blood loss from her GI tract. She received 2 units of PRBCs during her hospitalization. Most recent hemoglobin was 11.1 on the day of discharge. Anna Prince is due to see Dr. Derrel Nip, her PCP, tomorrow. I will defer rechecking hemoglobin and management of her anemia to Dr. Derrel Nip.  Mitral regurgitation No exam findings or symptoms to suggest decompensated heart failure. We will continue with medical management and clinical follow-up.  Hiatal hernia with gastritis and Cameron ulcers Continue PPI. Avoid NSAIDs. Follow-up with PCP and GI.  Follow-up: Return to clinic in 2 months.  Nelva Bush, MD 09/06/2016 7:44 AM

## 2016-09-06 ENCOUNTER — Ambulatory Visit (INDEPENDENT_AMBULATORY_CARE_PROVIDER_SITE_OTHER): Payer: Medicare Other | Admitting: Internal Medicine

## 2016-09-06 ENCOUNTER — Encounter: Payer: Self-pay | Admitting: Internal Medicine

## 2016-09-06 VITALS — BP 106/74 | HR 68 | Temp 98.1°F | Resp 17 | Ht 65.0 in | Wt 122.6 lb

## 2016-09-06 DIAGNOSIS — Z9889 Other specified postprocedural states: Secondary | ICD-10-CM

## 2016-09-06 DIAGNOSIS — D62 Acute posthemorrhagic anemia: Secondary | ICD-10-CM

## 2016-09-06 DIAGNOSIS — K259 Gastric ulcer, unspecified as acute or chronic, without hemorrhage or perforation: Secondary | ICD-10-CM

## 2016-09-06 DIAGNOSIS — I25118 Atherosclerotic heart disease of native coronary artery with other forms of angina pectoris: Secondary | ICD-10-CM

## 2016-09-06 DIAGNOSIS — I214 Non-ST elevation (NSTEMI) myocardial infarction: Secondary | ICD-10-CM | POA: Diagnosis not present

## 2016-09-06 DIAGNOSIS — Z8679 Personal history of other diseases of the circulatory system: Secondary | ICD-10-CM

## 2016-09-06 DIAGNOSIS — Z716 Tobacco abuse counseling: Secondary | ICD-10-CM

## 2016-09-06 DIAGNOSIS — J432 Centrilobular emphysema: Secondary | ICD-10-CM

## 2016-09-06 DIAGNOSIS — I10 Essential (primary) hypertension: Secondary | ICD-10-CM

## 2016-09-06 DIAGNOSIS — Z09 Encounter for follow-up examination after completed treatment for conditions other than malignant neoplasm: Secondary | ICD-10-CM

## 2016-09-06 DIAGNOSIS — D500 Iron deficiency anemia secondary to blood loss (chronic): Secondary | ICD-10-CM

## 2016-09-06 LAB — CBC WITH DIFFERENTIAL/PLATELET
BASOS ABS: 0 10*3/uL (ref 0.0–0.1)
Basophils Relative: 0.2 % (ref 0.0–3.0)
EOS ABS: 0.4 10*3/uL (ref 0.0–0.7)
Eosinophils Relative: 4.6 % (ref 0.0–5.0)
HCT: 32.7 % — ABNORMAL LOW (ref 36.0–46.0)
HEMOGLOBIN: 10.7 g/dL — AB (ref 12.0–15.0)
LYMPHS ABS: 1 10*3/uL (ref 0.7–4.0)
Lymphocytes Relative: 10.4 % — ABNORMAL LOW (ref 12.0–46.0)
MCHC: 32.8 g/dL (ref 30.0–36.0)
MCV: 91 fl (ref 78.0–100.0)
MONO ABS: 0.7 10*3/uL (ref 0.1–1.0)
Monocytes Relative: 7.7 % (ref 3.0–12.0)
NEUTROS PCT: 77.1 % — AB (ref 43.0–77.0)
Neutro Abs: 7.4 10*3/uL (ref 1.4–7.7)
Platelets: 195 10*3/uL (ref 150.0–400.0)
RBC: 3.59 Mil/uL — AB (ref 3.87–5.11)
RDW: 14.6 % (ref 11.5–15.5)
WBC: 9.6 10*3/uL (ref 4.0–10.5)

## 2016-09-06 NOTE — Progress Notes (Signed)
Subjective:  Patient ID: Anna Prince, female    DOB: 1931/05/26  Age: 83 y.o. MRN: [id number]  CC: The primary encounter diagnosis was Acute blood loss as cause of postoperative anemia. Diagnoses of Essential hypertension, S/P abdominal aortic aneurysm repair, Non-ST elevation (NSTEMI) myocardial infarction [facility]), Hospital discharge follow-up, Iron deficiency anemia due to chronic blood loss, Centrilobular emphysema ([place]), Gastric ulcer requiring drug therapy, unspecified ulcer chronicity, Coronary artery disease of native artery of native heart with stable angina pectoris ([place]), and Tobacco abuse counseling were also pertinent to this visit.  HPI Anna Prince presents for hospital follow up  Patient  Was admitted to [facility]  on May 4 with NSTEMI  after presenting to her regularly scheduled office visit with a 2 week history of recurrent chest pain and EKG ischemic changes .   She underwent dagnostic cardiac cath which was concerning for  multiple high risk lesions, and was  transferred to [facility] on May 8 for consideration of CABG , which she declined. Patient refused CABG, underwent atherectomy and PCI with DES to proximal LAD. she suffered a peri procedural occlusion of D1 branch , but had no chest pain so no  Additional intervention was done. Marland Kitchen  Post operatiively had GI bleed with a  drop in hgb to 7.6 requiring transfusion of 2 units . EGD noted Cameron erosions,  An ulcer and a hiatal hernia,  protonix was prescribed bid.  Aspirin and Plavix were continued given placement of DES and she was advised to continue them for a minimum of one year .  She was discharged home on May 13th.  She has already started smoking again. She denies chest pain and understands the risks of in-stent stenosis . She has no intention of quitting at this point in her life.      Outpatient Medications Prior to Visit  Medication Sig Dispense Refill  . albuterol (PROVENTIL HFA;VENTOLIN HFA) 108 (90 Base)  MCG/ACT inhaler Inhale 2 puffs into the lungs every 6 (six) hours as needed for wheezing or shortness of breath. 1 Inhaler 2  . aspirin EC 81 MG EC tablet Take 1 tablet (81 mg total) by mouth daily.    Marland Kitchen atorvastatin (LIPITOR) 40 MG tablet Take 1 tablet (40 mg total) by mouth daily at 6 PM. 30 tablet 0  . budesonide-formoterol (SYMBICORT) 80-4.5 MCG/ACT inhaler Inhale 2 puffs into the lungs 2 (two) times daily. 1 Inhaler 12  . clopidogrel (PLAVIX) 75 MG tablet Take 1 tablet (75 mg total) by mouth daily. 30 tablet 6  . diazepam (VALIUM) 5 MG tablet Take 0.5 tablets (2.5 mg total) by mouth 2 (two) times daily as needed. 30 tablet 5  . ferrous sulfate 325 (65 FE) MG tablet Take 1 tablet (325 mg total) by mouth daily with breakfast. 30 tablet 3  . HYDROcodone-acetaminophen (NORCO/VICODIN) 5-325 MG tablet Take 1 tablet by mouth every 6 (six) hours as needed for moderate pain. Maximum 2 daily. 62 tablet 0  . lisinopril (PRINIVIL,ZESTRIL) 20 MG tablet TAKE 1 TABLET BY MOUTH EVERY DAY 90 tablet 2  . megestrol (MEGACE) 40 MG tablet Take 1 tablet (40 mg total) by mouth daily. 30 tablet 0  . metoprolol tartrate (LOPRESSOR) 25 MG tablet Take 0.5 tablets (12.5 mg total) by mouth 2 (two) times daily. 60 tablet 0  . mirtazapine (REMERON) 45 MG tablet Take 1 tablet (45 mg total) by mouth at bedtime. 90 tablet 5  . nitroGLYCERIN (NITROSTAT) 0.4 MG SL tablet Place  1 tablet (0.4 mg total) under the tongue every 5 (five) minutes x 3 doses as needed for chest pain. 30 tablet 12  . omeprazole (PRILOSEC) 40 MG capsule Take 1 capsule (40 mg total) by mouth 2 (two) times daily. 90 capsule 0  . PARoxetine (PAXIL) 30 MG tablet Take 1 tablet (30 mg total) by mouth daily. To be refilled by PCP 30 tablet 0   No facility-administered medications prior to visit.     Review of Systems;  Patient denies headache, fevers, malaise, unintentional weight loss, skin rash, eye pain, sinus congestion and sinus pain, sore throat,  dysphagia,  hemoptysis , cough, dyspnea, wheezing, chest pain, palpitations, orthopnea, edema, abdominal pain, nausea, melena, diarrhea, constipation, flank pain, dysuria, hematuria, urinary  Frequency, nocturia, numbness, tingling, seizures,  Focal weakness, Loss of consciousness,  Tremor, insomnia, depression, anxiety, and suicidal ideation.      Objective:  BP 106/74 (BP Location: Left Arm, Patient Position: Sitting, Cuff Size: Normal)   Pulse 68   Temp 98.1 F (36.7 C) (Oral)   Resp 17   Ht 5\' 5"  (1.651 m)   Wt 122 lb 9.6 oz (55.6 kg)   SpO2 99%   BMI 20.40 kg/m   BP Readings from Last 3 Encounters:  09/06/16 106/74  09/05/16 (!) 142/68  09/02/16 (!) 156/76    Wt Readings from Last 3 Encounters:  09/06/16 122 lb 9.6 oz (55.6 kg)  09/05/16 122 lb (55.3 kg)  09/02/16 110 lb (49.9 kg)    General appearance: alert, cooperative and appears stated age Ears: normal TM's and external ear canals both ears Throat: lips, mucosa, and tongue normal; teeth and gums normal Neck: no adenopathy, no carotid bruit, supple, symmetrical, trachea midline and thyroid not enlarged, symmetric, no tenderness/mass/nodules Back: symmetric, no curvature. ROM normal. No CVA tenderness. Lungs: clear to auscultation bilaterally.  decreased breath sounds  Bilaterally  Heart: regular rate and rhythm, S1, S2 normal, no murmur, click, rub or gallop Abdomen: soft, non-tender; bowel sounds normal; no masses,  no organomegaly Pulses: 2+ and symmetric Skin: Skin color, texture, turgor normal. No rashes or lesions Lymph nodes: Cervical, supraclavicular, and axillary nodes normal.  Lab Results  Component Value Date   HGBA1C 5.6 08/29/2016    Lab Results  Component Value Date   CREATININE 1.32 (H) 09/02/2016   CREATININE 1.30 (H) 09/01/2016   CREATININE 1.36 (H) 08/31/2016    Lab Results  Component Value Date   WBC 9.6 09/06/2016   HGB 10.7 (L) 09/06/2016   HCT 32.7 (L) 09/06/2016   PLT 195.0  09/06/2016   GLUCOSE 99 09/02/2016   CHOL 134 08/25/2016   TRIG 65 08/25/2016   HDL 47 08/25/2016   LDLDIRECT 94.0 11/25/2014   LDLCALC 74 08/25/2016   ALT 15 08/29/2016   AST 19 08/29/2016   NA 139 09/02/2016   K 3.8 09/02/2016   CL 110 09/02/2016   CREATININE 1.32 (H) 09/02/2016   BUN 18 09/02/2016   CO2 19 (L) 09/02/2016   TSH 0.44 11/04/2015   INR 1.09 08/31/2016   HGBA1C 5.6 08/29/2016    No results found.  Assessment & Plan:   Problem List Items Addressed This Visit    Tobacco abuse counseling    Spent 3 minutes discussing risk of continued tobacco abuse, including but not limited to in stent thrombosis,  Recurrent MI,  And lung CA. She  is not interested in pharmacotherapy at this time.      S/P abdominal aortic aneurysm repair  Non-ST elevation (NSTEMI) myocardial infarction ([place])    Secondary to multiple high risk lesions,  S/p PCI with DES to proximal LAD.  CABG declined by patient. Asa and plavix prescribed,  Patient still smoking and has been advised of the risk of in stent thrombosis.       Hypertension   Relevant Orders   Basic metabolic panel   Hospital discharge follow-up    Patient is stable post discharge and has no new issues or questions about discharge plans at the visit today for hospital follow up. All labs , imaging studies and progress notes from admission were reviewed with patient today  Repeat  CBC and BMET were done to monitor her anemia and CKD       Gastric ulcer requiring drug therapy    Nonbleeding,  on EGD done May 12 for GI bleed. Protonix BID       Coronary artery disease    Severe, with multiple high risk lesions.  S/p LAD stent  May 2018 with DES       COPD (chronic obstructive pulmonary disease) ([place])    Secondary to lifelong tobacco abuse.  She  is now hypoxic with mild exertion.  She will not likley tolerate Heart Track rehab but has been referred.        Anemia    Acute on chronic, Secondary to GI bleed s/p transfusion  x 2 for hgb 7.6  ,  Post transfusion hgb was 11.1 and has trended down slightly., but patient reports brown stools   Lab Results  Component Value Date   WBC 9.6 09/06/2016   HGB 10.7 (L) 09/06/2016   HCT 32.7 (L) 09/06/2016   MCV 91.0 09/06/2016   PLT 195.0 09/06/2016          Other Visit Diagnoses    Acute blood loss as cause of postoperative anemia    -  Primary   Relevant Orders   CBC with Differential/Platelet (Completed)      I am having Anna Prince maintain her lisinopril, albuterol, mirtazapine, diazepam, budesonide-formoterol, HYDROcodone-acetaminophen, atorvastatin, aspirin, metoprolol tartrate, clopidogrel, ferrous sulfate, megestrol, nitroGLYCERIN, PARoxetine, and omeprazole.  No orders of the defined types were placed in this encounter.   There are no discontinued medications.  Follow-up: Return in about 6 weeks (around 10/18/2016), or fasting labs,  OV 2 months.   Crecencio Mc, MD

## 2016-09-06 NOTE — Patient Instructions (Addendum)
Please return in 6 weeks for fasting blood work  No medication changes were done today

## 2016-09-08 ENCOUNTER — Other Ambulatory Visit: Payer: Self-pay | Admitting: Internal Medicine

## 2016-09-08 NOTE — Assessment & Plan Note (Addendum)
Patient is stable post discharge and has no new issues or questions about discharge plans at the visit today for hospital follow up. All labs , imaging studies and progress notes from admission were reviewed with patient today  Repeat  CBC and BMET were done to monitor her anemia and CKD

## 2016-09-08 NOTE — Assessment & Plan Note (Signed)
Spent 3 minutes discussing risk of continued tobacco abuse, including but not limited to in stent thrombosis,  Recurrent MI,  And lung CA. She  is not interested in pharmacotherapy at this time. 

## 2016-09-08 NOTE — Assessment & Plan Note (Signed)
Nonbleeding,  on EGD done May 12 for GI bleed. Protonix BID

## 2016-09-08 NOTE — Assessment & Plan Note (Signed)
Secondary to lifelong tobacco abuse.  She  is now hypoxic with mild exertion.  She will not likley tolerate Heart Track rehab but has been referred.

## 2016-09-08 NOTE — Assessment & Plan Note (Signed)
Secondary to multiple high risk lesions,  S/p PCI with DES to proximal LAD.  CABG declined by patient. Asa and plavix prescribed,  Patient still smoking and has been advised of the risk of in stent thrombosis.

## 2016-09-08 NOTE — Assessment & Plan Note (Signed)
Severe, with multiple high risk lesions.  S/p LAD stent  May 2018 with DES

## 2016-09-08 NOTE — Assessment & Plan Note (Addendum)
Acute on chronic, Secondary to GI bleed s/p transfusion x 2 for hgb 7.6  ,  Post transfusion hgb was 11.1 and has trended down slightly., but patient reports brown stools   Lab Results  Component Value Date   WBC 9.6 09/06/2016   HGB 10.7 (L) 09/06/2016   HCT 32.7 (L) 09/06/2016   MCV 91.0 09/06/2016   PLT 195.0 09/06/2016

## 2016-09-11 ENCOUNTER — Other Ambulatory Visit: Payer: Self-pay | Admitting: Internal Medicine

## 2016-09-11 ENCOUNTER — Telehealth: Payer: Self-pay | Admitting: Internal Medicine

## 2016-09-11 DIAGNOSIS — D500 Iron deficiency anemia secondary to blood loss (chronic): Secondary | ICD-10-CM

## 2016-09-11 NOTE — Telephone Encounter (Signed)
I agree, that if the patient has recurrent chest pain, she should come to the ER for further evaluation. Her recent dark stools might be related to iron supplementation. However, she should discuss this with Dr. Derrel Nip, particularly if she has abdominal pain or notices bright red blood per rectum.

## 2016-09-11 NOTE — Telephone Encounter (Signed)
Pt daughter is calling, states pt for the last 2 days has had to take Nitroglycerin. States pt has had pressure and "aches" in her chest. Pt is also complaining about being cold. States pt is "lethargic". Patient is not currently having chest pain now. Also states pt stool is "black as tar".

## 2016-09-11 NOTE — Telephone Encounter (Signed)
Returned call to daughter. Gave her Dr Darnelle Bos recommendations and she verbalized understanding of advice and what to do.

## 2016-09-11 NOTE — Telephone Encounter (Signed)
Returned call to patient's daughter. Patient has dementia. Patient told daughter this morning she had a bowel movement that was "black as tar." Has had 2 episodes of chest pain lasting less than 1 minute each with no other associated symptoms. First episode was 2 days ago while patient sitting in bed reading. Second episode was last evening while laying in bed. Patient took 1 nitroglycerin each time and it went away. Patient has been having chills and feels "weak." Denies chest pain, SOB, nausea or sweating at this time. BP cuff is broken and she cannot find the thermometer. Today, Dr Lupita Dawn office called and gave patient results of lab work that hemoglobin 10.7 and to continue on iron supplement. Recent EGD on 09/02/16 showed: "- Non-obstructing Schatzki ring. - Large hiatal hernia with multiple Cameron ulcers. - Gastritis. Biopsied. - Large hiatal hernia."  Daughter verbalized understanding to go to the emergency room or call 911 if the chest pain returns or worsens and she develops other symptoms, such as SOB, nausea, vomiting, sweating, left arm pain. Advised to look at patient's next BM if possible and to contact primary care concerning chills and weakness. Will route Dr End for further advice.

## 2016-09-11 NOTE — Telephone Encounter (Signed)
Pt daughter called back returning your call.   Call daughter @ [phone]. Thank you!

## 2016-09-12 NOTE — Telephone Encounter (Signed)
See result note message 

## 2016-09-19 ENCOUNTER — Telehealth: Payer: Self-pay | Admitting: Radiology

## 2016-09-19 ENCOUNTER — Other Ambulatory Visit (INDEPENDENT_AMBULATORY_CARE_PROVIDER_SITE_OTHER): Payer: Medicare Other

## 2016-09-19 DIAGNOSIS — I10 Essential (primary) hypertension: Secondary | ICD-10-CM

## 2016-09-19 DIAGNOSIS — E7800 Pure hypercholesterolemia, unspecified: Secondary | ICD-10-CM

## 2016-09-19 DIAGNOSIS — R195 Other fecal abnormalities: Secondary | ICD-10-CM

## 2016-09-19 DIAGNOSIS — D500 Iron deficiency anemia secondary to blood loss (chronic): Secondary | ICD-10-CM

## 2016-09-19 LAB — LDL CHOLESTEROL, DIRECT: LDL DIRECT: 75 mg/dL

## 2016-09-19 LAB — CBC WITH DIFFERENTIAL/PLATELET
BASOS PCT: 0.8 % (ref 0.0–3.0)
Basophils Absolute: 0 10*3/uL (ref 0.0–0.1)
EOS PCT: 6.7 % — AB (ref 0.0–5.0)
Eosinophils Absolute: 0.4 10*3/uL (ref 0.0–0.7)
HCT: 31.3 % — ABNORMAL LOW (ref 36.0–46.0)
HEMOGLOBIN: 10.5 g/dL — AB (ref 12.0–15.0)
LYMPHS ABS: 1.3 10*3/uL (ref 0.7–4.0)
Lymphocytes Relative: 21.5 % (ref 12.0–46.0)
MCHC: 33.4 g/dL (ref 30.0–36.0)
MCV: 90.9 fl (ref 78.0–100.0)
MONO ABS: 0.4 10*3/uL (ref 0.1–1.0)
Monocytes Relative: 7.4 % (ref 3.0–12.0)
Neutro Abs: 3.8 10*3/uL (ref 1.4–7.7)
Neutrophils Relative %: 63.6 % (ref 43.0–77.0)
Platelets: 206 10*3/uL (ref 150.0–400.0)
RBC: 3.44 Mil/uL — ABNORMAL LOW (ref 3.87–5.11)
RDW: 14.8 % (ref 11.5–15.5)
WBC: 6 10*3/uL (ref 4.0–10.5)

## 2016-09-19 LAB — LIPID PANEL
Cholesterol: 150 mg/dL (ref 0–200)
HDL: 39.9 mg/dL (ref >39.00)
LDL Cholesterol: 95 mg/dL (ref 0–99)
NONHDL: 109.64
Total CHOL/HDL Ratio: 4
Triglycerides: 71 mg/dL (ref 0.0–149.0)
VLDL: 14.2 mg/dL (ref 0.0–40.0)

## 2016-09-19 LAB — BASIC METABOLIC PANEL
BUN: 33 mg/dL — AB (ref 6–23)
CHLORIDE: 108 meq/L (ref 96–112)
CO2: 23 meq/L (ref 19–32)
Calcium: 9.2 mg/dL (ref 8.4–10.5)
Creatinine, Ser: 1.52 mg/dL — ABNORMAL HIGH (ref 0.40–1.20)
GFR: 34.75 mL/min — ABNORMAL LOW (ref >60.00)
GLUCOSE: 94 mg/dL (ref 70–99)
POTASSIUM: 4.5 meq/L (ref 3.5–5.1)
Sodium: 137 mEq/L (ref 135–145)

## 2016-09-19 NOTE — Telephone Encounter (Signed)
PT came in for blood work today and dropped off IFOB stool card. Please place future order for stool test. Thank you.

## 2016-09-19 NOTE — Telephone Encounter (Signed)
done

## 2016-09-19 NOTE — Addendum Note (Signed)
Addended by: Crecencio Mc on: 09/19/2016 01:40 PM   Modules accepted: Orders

## 2016-09-21 ENCOUNTER — Encounter: Payer: Self-pay | Admitting: *Deleted

## 2016-09-21 ENCOUNTER — Other Ambulatory Visit: Payer: Self-pay | Admitting: Internal Medicine

## 2016-09-21 ENCOUNTER — Other Ambulatory Visit (INDEPENDENT_AMBULATORY_CARE_PROVIDER_SITE_OTHER): Payer: Medicare Other

## 2016-09-21 DIAGNOSIS — N183 Chronic kidney disease, stage 3 unspecified: Secondary | ICD-10-CM

## 2016-09-21 DIAGNOSIS — J9611 Chronic respiratory failure with hypoxia: Secondary | ICD-10-CM | POA: Diagnosis not present

## 2016-09-21 DIAGNOSIS — R195 Other fecal abnormalities: Secondary | ICD-10-CM

## 2016-09-21 DIAGNOSIS — J449 Chronic obstructive pulmonary disease, unspecified: Secondary | ICD-10-CM | POA: Diagnosis not present

## 2016-09-21 LAB — FECAL OCCULT BLOOD, IMMUNOCHEMICAL: FECAL OCCULT BLD: NEGATIVE

## 2016-09-21 IMAGING — CR DG CHEST 2V
1 series · 2 of 2 positions shown · non-contrast
Comparison: 04/14/2015

CLINICAL DATA: Fall, right posterior rib pain.  Recent pneumonia.

EXAM:
CHEST  2 VIEW

[Series 1: dg chest 2 view · 0.14mm/px · 2 of 2 slices shown]
[im 1/2]
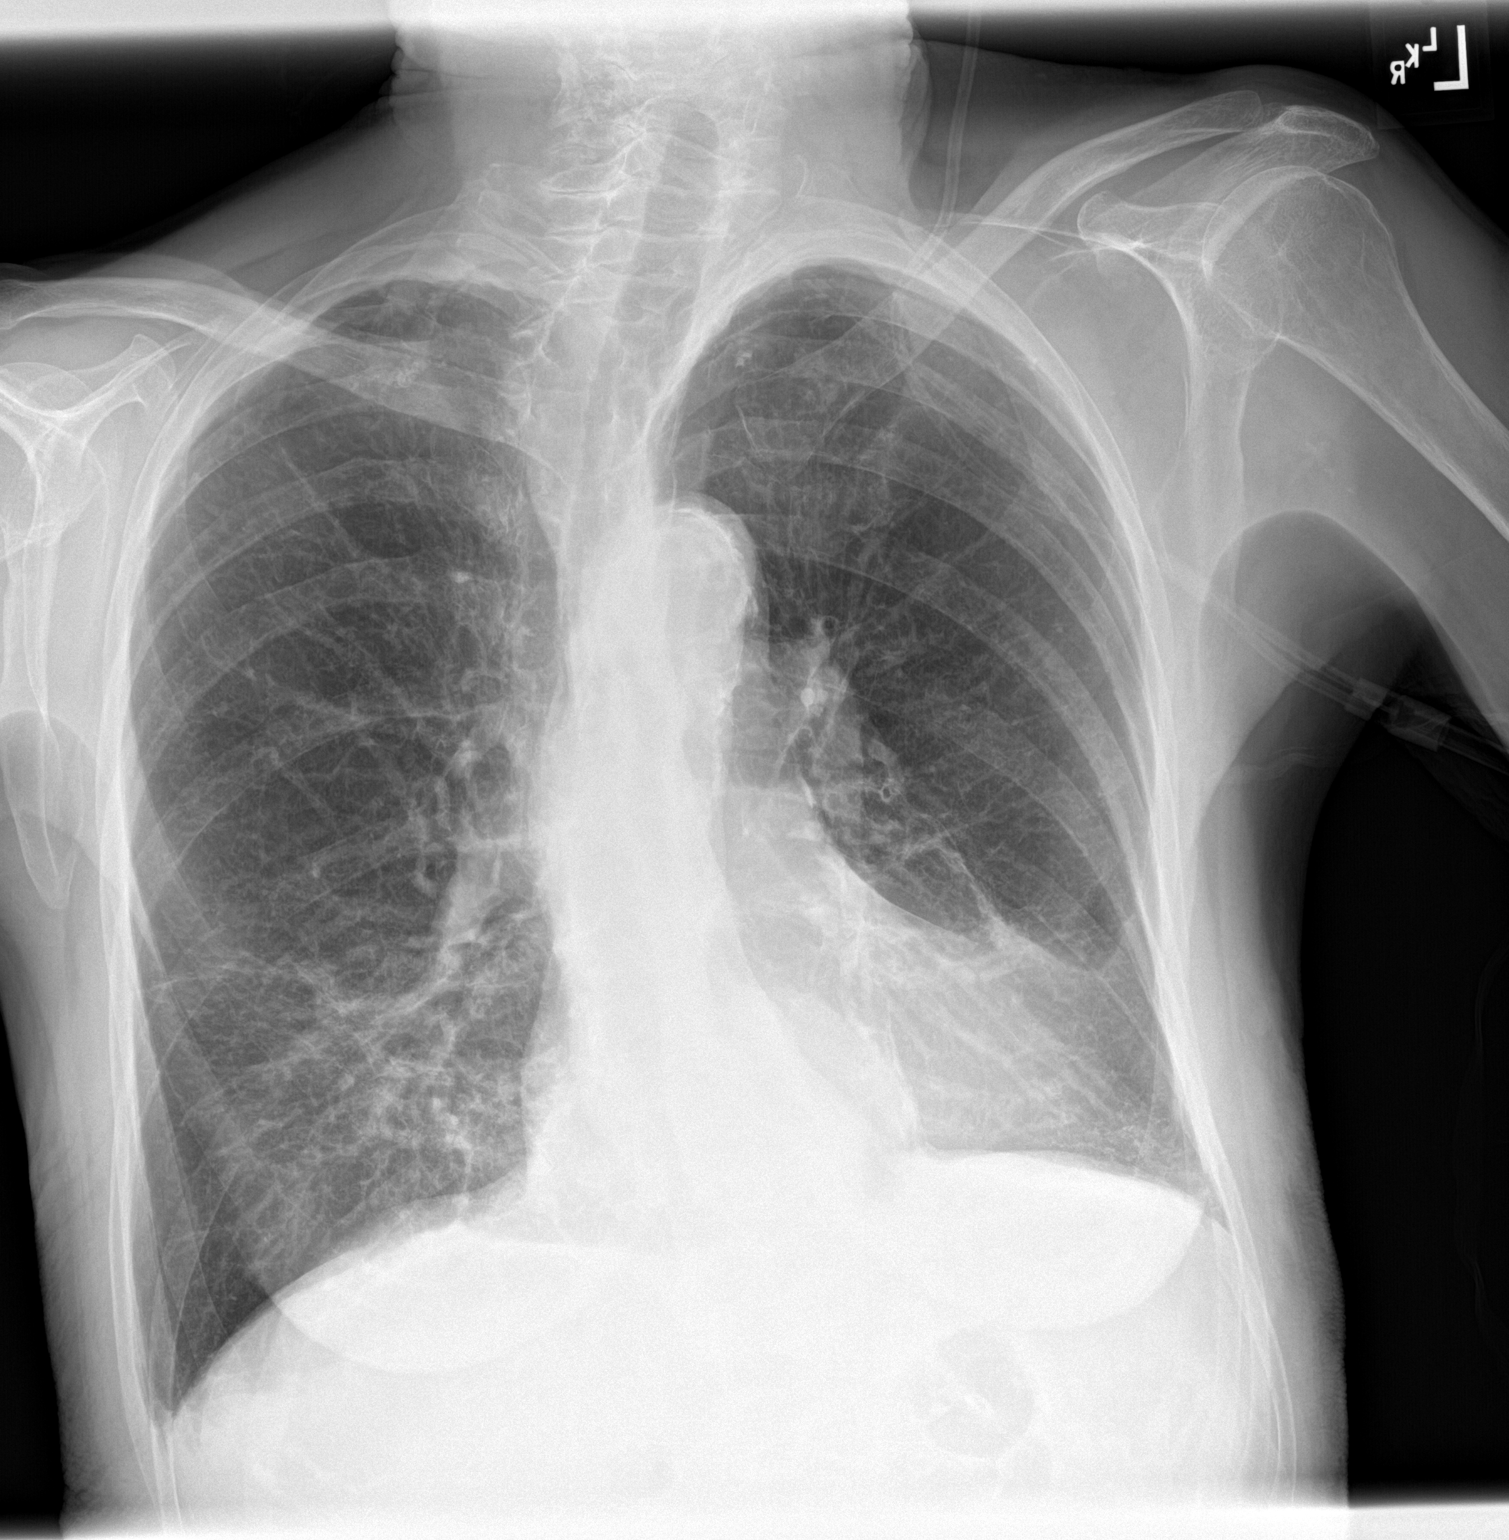
[im 2/2]
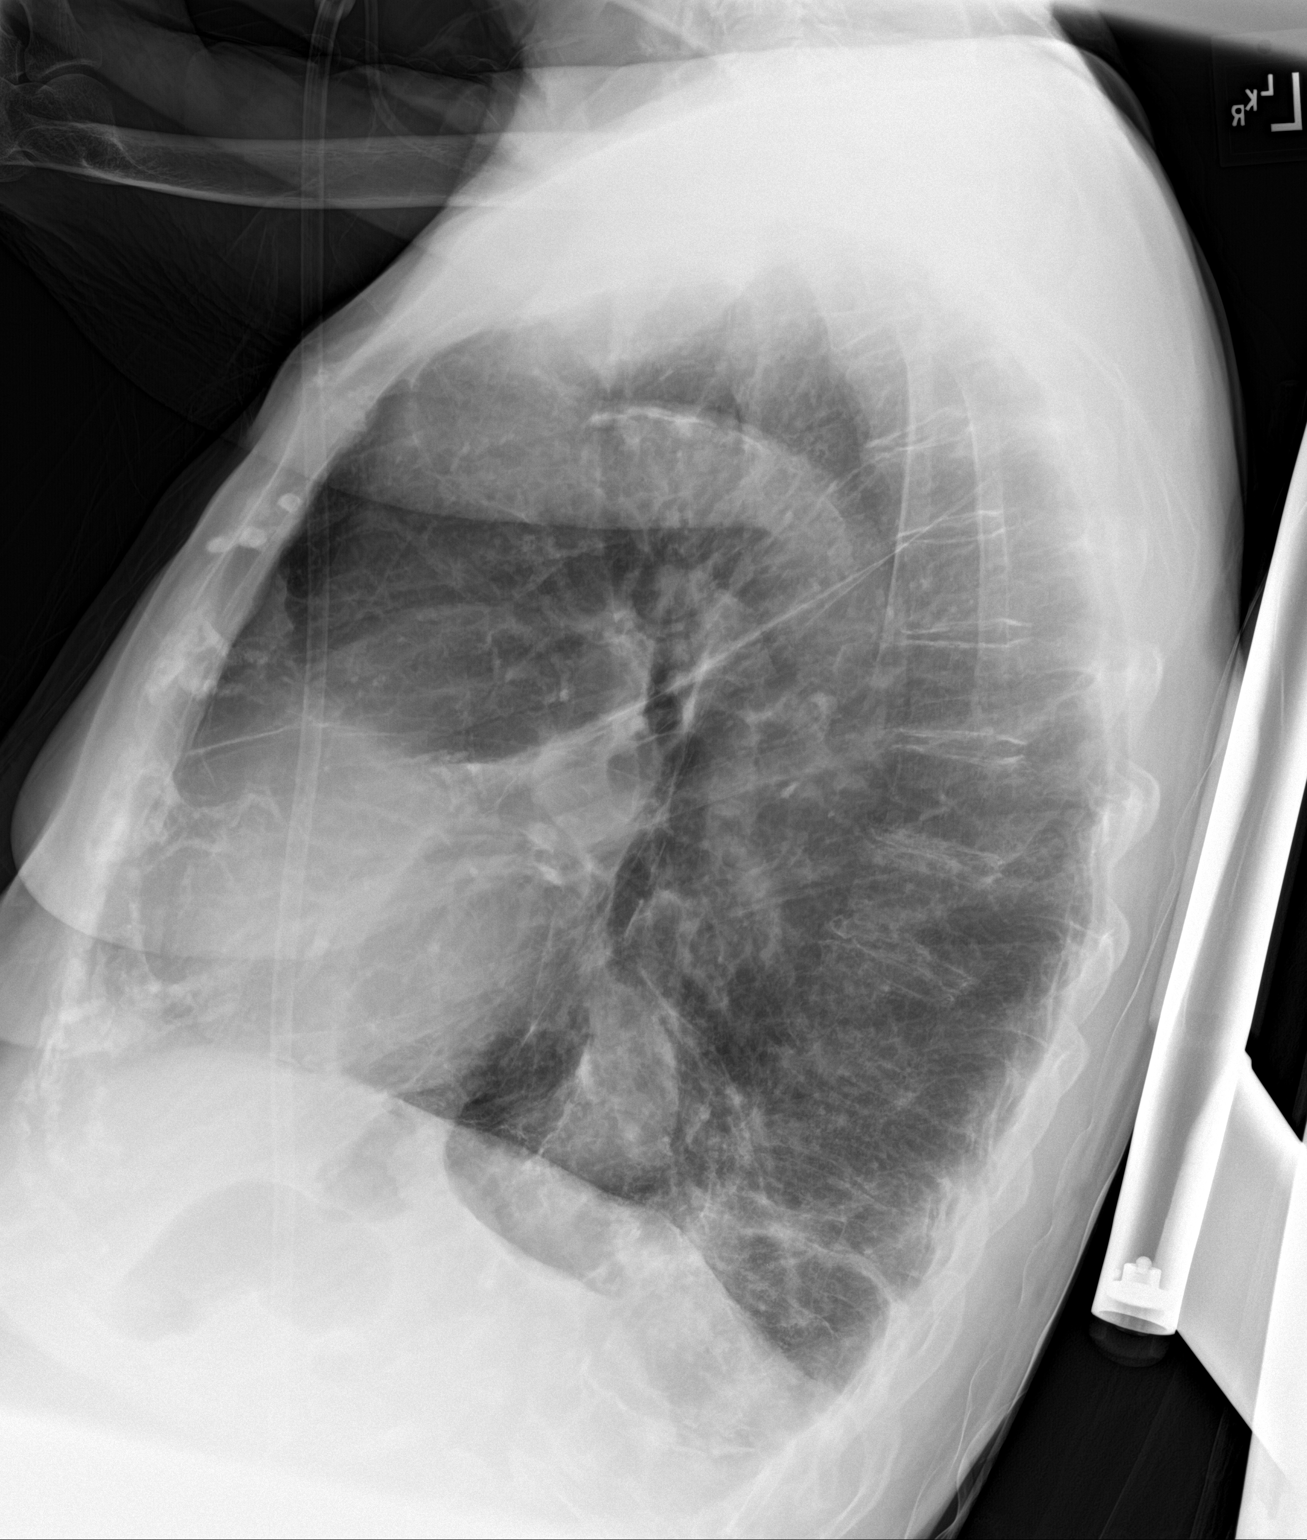

[2 of 2 positions shown; findings below may reference images not displayed]

FINDINGS: There is hyperinflation of the lungs compatible with COPD. Improving
right lower lobe airspace disease. Small hiatal hernia. Heart is
upper limits normal in size. No confluent airspace opacities or
effusions. No acute bony abnormality. No visible displaced rib
fracture or pneumothorax.
IMPRESSION: COPD/chronic changes.  No active disease.

## 2016-09-24 ENCOUNTER — Other Ambulatory Visit (HOSPITAL_COMMUNITY): Payer: Self-pay | Admitting: Respiratory Therapy

## 2016-09-28 ENCOUNTER — Other Ambulatory Visit: Payer: Self-pay | Admitting: *Deleted

## 2016-09-28 MED ORDER — NITROGLYCERIN 0.4 MG SL SUBL: 0.4000 mg | SUBLINGUAL_TABLET | SUBLINGUAL | 1 refills | Status: AC | PRN

## 2016-09-28 MED ORDER — DIAZEPAM 5 MG PO TABS: 2.5000 mg | ORAL_TABLET | Freq: Two times a day (BID) | ORAL | 0 refills | Status: DC | PRN

## 2016-09-28 MED ORDER — OMEPRAZOLE 40 MG PO CPDR: 40.0000 mg | DELAYED_RELEASE_CAPSULE | Freq: Two times a day (BID) | ORAL | 1 refills | Status: DC

## 2016-09-28 MED ORDER — PAROXETINE HCL 30 MG PO TABS: 30.0000 mg | ORAL_TABLET | Freq: Every day | ORAL | 1 refills | Status: DC

## 2016-09-28 MED ORDER — BUDESONIDE-FORMOTEROL FUMARATE 80-4.5 MCG/ACT IN AERO: 2.0000 | INHALATION_SPRAY | Freq: Two times a day (BID) | RESPIRATORY_TRACT | 1 refills | Status: DC

## 2016-09-28 MED ORDER — ATORVASTATIN CALCIUM 40 MG PO TABS: 40.0000 mg | ORAL_TABLET | Freq: Every day | ORAL | 1 refills | Status: DC

## 2016-09-28 MED ORDER — MIRTAZAPINE 45 MG PO TABS: 45.0000 mg | ORAL_TABLET | Freq: Every day | ORAL | 1 refills | Status: DC

## 2016-09-28 MED ORDER — MEGESTROL ACETATE 40 MG PO TABS: 40.0000 mg | ORAL_TABLET | Freq: Every day | ORAL | 0 refills | Status: DC

## 2016-09-28 MED ORDER — CLOPIDOGREL BISULFATE 75 MG PO TABS: 75.0000 mg | ORAL_TABLET | Freq: Every day | ORAL | 1 refills | Status: DC

## 2016-09-28 NOTE — Telephone Encounter (Signed)
REFILLED AND SENT  TO [facility] .  LOVASTATIN NOT NEEDED SINCE SHE IS TAKING ATORVASTATIN

## 2016-09-28 NOTE — Telephone Encounter (Signed)
Okay to send in pended meds. Pt is switching to [facility]. The request also mentioned Lovastatin (which is not on med list-and no dose or directions was listed on refill request).

## 2016-10-01 NOTE — Telephone Encounter (Signed)
Diazepam rx printed, signed and faxed.

## 2016-10-02 ENCOUNTER — Other Ambulatory Visit: Payer: Self-pay | Admitting: Internal Medicine

## 2016-10-07 ENCOUNTER — Telehealth: Payer: Self-pay | Admitting: Internal Medicine

## 2016-10-08 MED ORDER — DIAZEPAM 5 MG PO TABS: 2.5000 mg | ORAL_TABLET | Freq: Two times a day (BID) | ORAL | 0 refills | Status: DC | PRN

## 2016-10-08 MED ORDER — MIRTAZAPINE 45 MG PO TABS: 45.0000 mg | ORAL_TABLET | Freq: Every day | ORAL | 1 refills | Status: DC

## 2016-10-08 MED ORDER — CLOPIDOGREL BISULFATE 75 MG PO TABS: 75.0000 mg | ORAL_TABLET | Freq: Every day | ORAL | 1 refills | Status: DC

## 2016-10-08 MED ORDER — ATORVASTATIN CALCIUM 40 MG PO TABS: 40.0000 mg | ORAL_TABLET | Freq: Every day | ORAL | 1 refills | Status: DC

## 2016-10-08 MED ORDER — BUDESONIDE-FORMOTEROL FUMARATE 80-4.5 MCG/ACT IN AERO: 2.0000 | INHALATION_SPRAY | Freq: Two times a day (BID) | RESPIRATORY_TRACT | 1 refills | Status: DC

## 2016-10-08 MED ORDER — PAROXETINE HCL 30 MG PO TABS: 30.0000 mg | ORAL_TABLET | Freq: Every day | ORAL | 1 refills | Status: DC

## 2016-10-08 MED ORDER — OMEPRAZOLE 40 MG PO CPDR: 40.0000 mg | DELAYED_RELEASE_CAPSULE | Freq: Two times a day (BID) | ORAL | 1 refills | Status: DC

## 2016-10-08 MED ORDER — MEGESTROL ACETATE 40 MG PO TABS: 40.0000 mg | ORAL_TABLET | Freq: Every day | ORAL | 0 refills | Status: DC

## 2016-10-08 NOTE — Telephone Encounter (Signed)
Pt granddaughter called wondering why her medication was refused .Marland Kitchen Please advise?  Pharmacy is [facility] [place], [place] - [address]  Call pt @ [phone]. Thank you!

## 2016-10-08 NOTE — Telephone Encounter (Signed)
Called and spoke with the pt's daughter(on DPR) and explained to her that the lovastatin had been refused because the pt is also taking atorvastatin. Daughter thought that was why but wanted to make sure. Also explained to her that the diazepam had been refilled on 09/28/16 and was sent to the [facility] mail order. The daughter then informed me that they had spoken with [facility] and told them that they did not want to use the mail order service and not to send the rx refills to [place]. Told pt's daughter that we would get all the medications sent back to [facility] in [place]. Daughter stated that she would call [facility] to let them know again that the pt is not using them as her pharmacy.   All medications have been resent to correct pharmacy. Diazepam rx has been printed out and placed in the quick sign folder.

## 2016-10-08 NOTE — Addendum Note (Signed)
Addended by: Adair Laundry on: 10/08/2016 03:33 PM   Modules accepted: Orders

## 2016-10-11 ENCOUNTER — Telehealth: Payer: Self-pay

## 2016-10-11 MED ORDER — PANTOPRAZOLE SODIUM 40 MG PO TBEC: 40.0000 mg | DELAYED_RELEASE_TABLET | Freq: Every day | ORAL | 11 refills | Status: DC

## 2016-10-11 NOTE — Telephone Encounter (Signed)
-----   Message from Lendon Colonel, NP sent at 10/11/2016 11:21 AM EDT ----- Regarding: Medication change Please change omeprazole( Priilosec) ,to  Protonix (pantoprazole) as she is on Plavix.  Protonix should be 40 mg daily. (She is on Prilosec 40 mg BID currently). She will need to follow up with PCP for ongoing management if GERD symptoms worsen on medication change. IF she is no longer on Plavix no changes. Last office note has her on Plavix.   Thanks.

## 2016-10-11 NOTE — Telephone Encounter (Signed)
Pt will stop prilosec and begin protonix, she requested 30 day supply

## 2016-10-12 ENCOUNTER — Telehealth: Payer: Self-pay | Admitting: Internal Medicine

## 2016-10-12 NOTE — Telephone Encounter (Signed)
Reviewed patients medication list with her in detail and she wanted to know which medications were changed. Per telephone note it appears that her PCP office stopped her Prilosec and started her on Protonix due to being on Plavix. Reviewed these with her in detail and she verbalized understanding with no further questions at this time.

## 2016-10-12 NOTE — Telephone Encounter (Signed)
Pt calling stating she is confused and would like to know which medications she is to stop and which she is to take   Please call back

## 2016-10-19 ENCOUNTER — Other Ambulatory Visit: Payer: Medicare Other

## 2016-10-21 DIAGNOSIS — J449 Chronic obstructive pulmonary disease, unspecified: Secondary | ICD-10-CM | POA: Diagnosis not present

## 2016-10-21 DIAGNOSIS — J9611 Chronic respiratory failure with hypoxia: Secondary | ICD-10-CM | POA: Diagnosis not present

## 2016-10-31 ENCOUNTER — Other Ambulatory Visit (INDEPENDENT_AMBULATORY_CARE_PROVIDER_SITE_OTHER): Payer: Medicare Other

## 2016-10-31 ENCOUNTER — Telehealth: Payer: Self-pay | Admitting: *Deleted

## 2016-10-31 DIAGNOSIS — R197 Diarrhea, unspecified: Secondary | ICD-10-CM

## 2016-10-31 LAB — COMPREHENSIVE METABOLIC PANEL
ALK PHOS: 62 U/L (ref 39–117)
ALT: 7 U/L (ref 0–35)
AST: 10 U/L (ref 0–37)
Albumin: 3.4 g/dL — ABNORMAL LOW (ref 3.5–5.2)
BILIRUBIN TOTAL: 0.3 mg/dL (ref 0.2–1.2)
BUN: 36 mg/dL — ABNORMAL HIGH (ref 6–23)
CO2: 20 mEq/L (ref 19–32)
Calcium: 9.1 mg/dL (ref 8.4–10.5)
Chloride: 109 mEq/L (ref 96–112)
Creatinine, Ser: 1.75 mg/dL — ABNORMAL HIGH (ref 0.40–1.20)
GFR: 29.52 mL/min — AB (ref >60.00)
GLUCOSE: 109 mg/dL — AB (ref 70–99)
Potassium: 4.3 mEq/L (ref 3.5–5.1)
Sodium: 137 mEq/L (ref 135–145)
Total Protein: 6.4 g/dL (ref 6.0–8.3)

## 2016-10-31 LAB — MAGNESIUM: Magnesium: 1.5 mg/dL (ref 1.5–2.5)

## 2016-10-31 NOTE — Telephone Encounter (Signed)
Spoke with the daughter she will bring her mom today to get the labs and pick up the stool test. thanks

## 2016-10-31 NOTE — Telephone Encounter (Signed)
Stool tests ordered. Labs as well .

## 2016-10-31 NOTE — Telephone Encounter (Signed)
Spoke with the daughter, patient was recently hospitalized (three weeks ago approximately)  Has had diarrhea for the past three weeks. Per the daughter, hasn't seen it a sample, but patient states it is diarrhea.  I asked that if she goes again to have daughter look or patient to tell her more descriptions like  (consistency, color, any blood) asked about intake, per the daughter not in taking enough. Encouraged fluids.  Explained that diarrhea is going to deplet the electrolytes and that even if she is not eating a ton she needs to increase fluids to stay hydrated.  Patient has been taking the Protonix as advised, still feels sore on the inside.   I explained to the daughter that the patient most likely needs labs, and a stool specimen checked and possibly an appointment.  Daughter agreed and was concerned. Please advise, thanks

## 2016-10-31 NOTE — Telephone Encounter (Signed)
Pt has history of diverticulitis, pt has had diarrhea for 3 week, off and on. Pt is fatigue . Daughter would like a call to discuss care.  Please call  [name] [phone]

## 2016-11-05 ENCOUNTER — Inpatient Hospital Stay
Admission: EM | Admit: 2016-11-05 | Discharge: 2016-11-08 | DRG: 378 | Disposition: A | Discharge status: Home / Self Care | Payer: Medicare Other | Attending: Specialist | Admitting: Specialist

## 2016-11-05 ENCOUNTER — Emergency Department: Payer: Medicare Other

## 2016-11-05 ENCOUNTER — Encounter: Payer: Self-pay | Admitting: Emergency Medicine

## 2016-11-05 DIAGNOSIS — K529 Noninfective gastroenteritis and colitis, unspecified: Secondary | ICD-10-CM | POA: Diagnosis not present

## 2016-11-05 DIAGNOSIS — Z7982 Long term (current) use of aspirin: Secondary | ICD-10-CM

## 2016-11-05 DIAGNOSIS — K922 Gastrointestinal hemorrhage, unspecified: Principal | ICD-10-CM | POA: Diagnosis present

## 2016-11-05 DIAGNOSIS — I10 Essential (primary) hypertension: Secondary | ICD-10-CM | POA: Diagnosis present

## 2016-11-05 DIAGNOSIS — I2510 Atherosclerotic heart disease of native coronary artery without angina pectoris: Secondary | ICD-10-CM | POA: Diagnosis present

## 2016-11-05 DIAGNOSIS — K449 Diaphragmatic hernia without obstruction or gangrene: Secondary | ICD-10-CM | POA: Diagnosis present

## 2016-11-05 DIAGNOSIS — Z79899 Other long term (current) drug therapy: Secondary | ICD-10-CM

## 2016-11-05 DIAGNOSIS — D62 Acute posthemorrhagic anemia: Secondary | ICD-10-CM | POA: Diagnosis present

## 2016-11-05 DIAGNOSIS — H3530 Unspecified macular degeneration: Secondary | ICD-10-CM | POA: Diagnosis present

## 2016-11-05 DIAGNOSIS — F17210 Nicotine dependence, cigarettes, uncomplicated: Secondary | ICD-10-CM | POA: Diagnosis present

## 2016-11-05 DIAGNOSIS — R197 Diarrhea, unspecified: Secondary | ICD-10-CM | POA: Diagnosis not present

## 2016-11-05 DIAGNOSIS — D509 Iron deficiency anemia, unspecified: Secondary | ICD-10-CM | POA: Diagnosis present

## 2016-11-05 DIAGNOSIS — K559 Vascular disorder of intestine, unspecified: Secondary | ICD-10-CM | POA: Diagnosis present

## 2016-11-05 DIAGNOSIS — R52 Pain, unspecified: Secondary | ICD-10-CM

## 2016-11-05 DIAGNOSIS — K219 Gastro-esophageal reflux disease without esophagitis: Secondary | ICD-10-CM | POA: Diagnosis present

## 2016-11-05 DIAGNOSIS — K921 Melena: Secondary | ICD-10-CM | POA: Diagnosis present

## 2016-11-05 DIAGNOSIS — N179 Acute kidney failure, unspecified: Secondary | ICD-10-CM | POA: Diagnosis present

## 2016-11-05 DIAGNOSIS — M1990 Unspecified osteoarthritis, unspecified site: Secondary | ICD-10-CM | POA: Diagnosis present

## 2016-11-05 DIAGNOSIS — R1032 Left lower quadrant pain: Secondary | ICD-10-CM | POA: Diagnosis not present

## 2016-11-05 DIAGNOSIS — F329 Major depressive disorder, single episode, unspecified: Secondary | ICD-10-CM | POA: Diagnosis present

## 2016-11-05 DIAGNOSIS — K2980 Duodenitis without bleeding: Secondary | ICD-10-CM | POA: Diagnosis present

## 2016-11-05 DIAGNOSIS — M810 Age-related osteoporosis without current pathological fracture: Secondary | ICD-10-CM | POA: Diagnosis present

## 2016-11-05 DIAGNOSIS — K259 Gastric ulcer, unspecified as acute or chronic, without hemorrhage or perforation: Secondary | ICD-10-CM | POA: Diagnosis present

## 2016-11-05 DIAGNOSIS — F419 Anxiety disorder, unspecified: Secondary | ICD-10-CM | POA: Diagnosis present

## 2016-11-05 DIAGNOSIS — K5730 Diverticulosis of large intestine without perforation or abscess without bleeding: Secondary | ICD-10-CM | POA: Diagnosis present

## 2016-11-05 DIAGNOSIS — I739 Peripheral vascular disease, unspecified: Secondary | ICD-10-CM | POA: Diagnosis present

## 2016-11-05 DIAGNOSIS — E785 Hyperlipidemia, unspecified: Secondary | ICD-10-CM | POA: Diagnosis present

## 2016-11-05 DIAGNOSIS — Z955 Presence of coronary angioplasty implant and graft: Secondary | ICD-10-CM

## 2016-11-05 DIAGNOSIS — D500 Iron deficiency anemia secondary to blood loss (chronic): Secondary | ICD-10-CM | POA: Diagnosis not present

## 2016-11-05 DIAGNOSIS — K2270 Barrett's esophagus without dysplasia: Secondary | ICD-10-CM | POA: Diagnosis present

## 2016-11-05 DIAGNOSIS — Z7902 Long term (current) use of antithrombotics/antiplatelets: Secondary | ICD-10-CM

## 2016-11-05 DIAGNOSIS — I252 Old myocardial infarction: Secondary | ICD-10-CM

## 2016-11-05 DIAGNOSIS — Z885 Allergy status to narcotic agent status: Secondary | ICD-10-CM

## 2016-11-05 DIAGNOSIS — Z8711 Personal history of peptic ulcer disease: Secondary | ICD-10-CM

## 2016-11-05 DIAGNOSIS — E860 Dehydration: Secondary | ICD-10-CM | POA: Diagnosis present

## 2016-11-05 DIAGNOSIS — Z66 Do not resuscitate: Secondary | ICD-10-CM | POA: Diagnosis not present

## 2016-11-05 DIAGNOSIS — K253 Acute gastric ulcer without hemorrhage or perforation: Secondary | ICD-10-CM | POA: Diagnosis not present

## 2016-11-05 DIAGNOSIS — J449 Chronic obstructive pulmonary disease, unspecified: Secondary | ICD-10-CM | POA: Diagnosis present

## 2016-11-05 DIAGNOSIS — Z7951 Long term (current) use of inhaled steroids: Secondary | ICD-10-CM

## 2016-11-05 DIAGNOSIS — D649 Anemia, unspecified: Secondary | ICD-10-CM | POA: Diagnosis not present

## 2016-11-05 DIAGNOSIS — Z8679 Personal history of other diseases of the circulatory system: Secondary | ICD-10-CM

## 2016-11-05 DIAGNOSIS — Z8673 Personal history of transient ischemic attack (TIA), and cerebral infarction without residual deficits: Secondary | ICD-10-CM

## 2016-11-05 DIAGNOSIS — K2990 Gastroduodenitis, unspecified, without bleeding: Secondary | ICD-10-CM | POA: Diagnosis not present

## 2016-11-05 LAB — COMPREHENSIVE METABOLIC PANEL
ALBUMIN: 3.2 g/dL — AB (ref 3.5–5.0)
ALK PHOS: 66 U/L (ref 38–126)
ALT: 11 U/L — AB (ref 14–54)
AST: 18 U/L (ref 15–41)
Anion gap: 9 (ref 5–15)
BILIRUBIN TOTAL: 0.3 mg/dL (ref 0.3–1.2)
BUN: 24 mg/dL — AB (ref 6–20)
CALCIUM: 8.9 mg/dL (ref 8.9–10.3)
CO2: 20 mmol/L — AB (ref 22–32)
CREATININE: 1.61 mg/dL — AB (ref 0.44–1.00)
Chloride: 109 mmol/L (ref 101–111)
GFR calc Af Amer: 33 mL/min — ABNORMAL LOW (ref >60)
GFR calc non Af Amer: 29 mL/min — ABNORMAL LOW (ref >60)
GLUCOSE: 95 mg/dL (ref 65–99)
Potassium: 3.9 mmol/L (ref 3.5–5.1)
SODIUM: 138 mmol/L (ref 135–145)
Total Protein: 6.3 g/dL — ABNORMAL LOW (ref 6.5–8.1)

## 2016-11-05 LAB — CBC
HCT: 23.2 % — ABNORMAL LOW (ref 35.0–47.0)
Hemoglobin: 7.8 g/dL — ABNORMAL LOW (ref 12.0–16.0)
MCH: 30.3 pg (ref 26.0–34.0)
MCHC: 33.8 g/dL (ref 32.0–36.0)
MCV: 89.6 fL (ref 80.0–100.0)
PLATELETS: 229 10*3/uL (ref 150–440)
RBC: 2.59 MIL/uL — ABNORMAL LOW (ref 3.80–5.20)
RDW: 15 % — AB (ref 11.5–14.5)
WBC: 5.4 10*3/uL (ref 3.6–11.0)

## 2016-11-05 LAB — LIPASE, BLOOD: Lipase: 43 U/L (ref 11–51)

## 2016-11-05 LAB — LACTIC ACID, PLASMA: LACTIC ACID, VENOUS: 1.1 mmol/L (ref 0.5–1.9)

## 2016-11-05 MED ORDER — PANTOPRAZOLE SODIUM 40 MG IV SOLR
80.0000 mg | Freq: Once | INTRAVENOUS | Status: AC
  Administered 2016-11-05: 23:00:00 80 mg via INTRAVENOUS
  Filled 2016-11-05: qty 80

## 2016-11-05 MED ORDER — PIPERACILLIN-TAZOBACTAM 3.375 G IVPB 30 MIN: 3.3750 g | Freq: Once | INTRAVENOUS | Status: DC

## 2016-11-05 MED ORDER — SODIUM CHLORIDE 0.9 % IV BOLUS (SEPSIS)
1000.0000 mL | Freq: Once | INTRAVENOUS | Status: AC
  Administered 2016-11-05: 1000 mL via INTRAVENOUS

## 2016-11-05 NOTE — ED Provider Notes (Signed)
[facility] Emergency Department Provider Note  ____________________________________________   First MD Initiated Contact with Patient 11/05/16 1920     (approximate)  I have reviewed the triage vital signs and the nursing notes.   HISTORY  Chief Complaint Diarrhea and Abdominal Pain   HPI Anna Prince is a 83 y.o. female who comes to the emergency department with 3-4 weeks of diffuse cramping abdominal discomfort and daily diarrhea. She has been having between 2-6 loose watery stools a day. She denies recent antibiotic use. Denies recent travel. She has a complex abdominal surgical history including a AAA repair as well as an appendectomy. She also recently had a non-ST elevation myocardial infarction 6 weeks ago and was begun on dual antiplatelet therapy. She's been followed by her primary care physician this morning was able to drop off a stool sample, however she began to feel fatigued and lightheaded so her doctor advised her to come to the emergency department. She has a history of diverticulosis as well as previous diverticulitis. She denies fevers or chills. She denies chest pain. She does report some nausea but no vomiting.  3-4 weeks Sunday worse   Past Medical History:  Diagnosis Date  . AAA (abdominal aortic aneurysm) ([place])   . Anemia 2012   of acute blood loss, resolved  . Anxiety   . Aortic aneurysm ([place])   . Arthritis   . Barrett's esophagus 2007  . Bursitis 2012   left hip, improved with periodic steroid injection [facility])  . COPD (chronic obstructive pulmonary disease) ([place])   . Coronary artery disease   . Depression   . GERD (gastroesophageal reflux disease)    with Barretts Esophagus  . Hyperlipidemia   . Hypertension   . Lumbago   . Macular degeneration   . Osteoporosis   . Sigmoid diverticulosis    by colonoscopy  . Stroke [facility])     Patient Active Problem List   Diagnosis Date Noted  . Coronary artery  disease 08/28/2016  . Non-ST elevation (NSTEMI) myocardial infarction ([place]) 08/24/2016  . Chronic respiratory failure with hypoxia ([place]) 05/17/2016  . Generalized muscle weakness 04/16/2015  . Hospital discharge follow-up 04/16/2015  . Coronary artery disease due to lipid rich plaque 04/14/2015  . Anxiety 04/14/2015  . Bursitis of left hip 11/27/2014  . Anemia 11/25/2014  . Elevated liver enzymes 08/25/2014  . History of fracture of pelvis or lower extremity 08/25/2014  . Vascular dementia with depressed mood 02/20/2014  . Chronic pain syndrome 05/27/2013  . Tobacco abuse counseling 04/10/2013  . Gastric ulcer requiring drug therapy   . Screening for colon cancer 06/18/2011  . COPD (chronic obstructive pulmonary disease) ([place]) 06/18/2011  . Skin cancer of nose 12/17/2010  . Vitamin D deficiency 12/17/2010  . Hyperlipidemia LDL goal <100 12/17/2010  . Screening for malignant neoplasm of breast 12/17/2010  . Encounter for long-term (current) use of other medications 12/17/2010  . S/P abdominal aortic aneurysm repair 12/17/2010  . Diverticulosis, sigmoid 12/08/2010  . Hyperlipidemia 12/08/2010  . Hypertension 12/08/2010  . Lumbago 12/08/2010  . Osteoporosis 12/08/2010  . Major depressive disorder, recurrent episode, moderate ([place]) 12/08/2010    Past Surgical History:  Procedure Laterality Date  . ABDOMINAL AORTIC ANEURYSM REPAIR  July 2010   [facility]  . APPENDECTOMY    . BACK SURGERY     X2..1975 arachnoid cyst cervical region([place]);1997 lumbosacral tumor,9 hr surgery  . CARDIAC CATHETERIZATION    . CARDIAC CATHETERIZATION    .  CORONARY ATHERECTOMY N/A 08/31/2016   Procedure: Coronary Atherectomy;  Surgeon: Nelva Bush, MD;  Location: [place];  Service: Cardiovascular;  Laterality: N/A;  . ESOPHAGOGASTRODUODENOSCOPY (EGD) WITH PROPOFOL N/A 09/02/2016   Procedure: ESOPHAGOGASTRODUODENOSCOPY (EGD) WITH PROPOFOL;  Surgeon: Otis Brace, MD;  Location: [facility];  Service: Gastroenterology;  Laterality: N/A;  . INTRAVASCULAR ULTRASOUND/IVUS N/A 08/31/2016   Procedure: Intravascular Ultrasound/IVUS;  Surgeon: Nelva Bush, MD;  Location: [place] [facility];  Service: Cardiovascular;  Laterality: N/A;  . LEFT HEART CATH AND CORONARY ANGIOGRAPHY N/A 08/27/2016   Procedure: Left Heart Cath and Coronary Angiography;  Surgeon: Teodoro Spray, MD;  Location: [place] [facility];  Service: Cardiovascular;  Laterality: N/A;    Prior to Admission medications   Medication Sig Start Date End Date Taking? Authorizing Provider  albuterol (PROVENTIL HFA;VENTOLIN HFA) 108 (90 Base) MCG/ACT inhaler Inhale 2 puffs into the lungs every 6 (six) hours as needed for wheezing or shortness of breath. 11/11/15   Alfred Levins, [place], MD  aspirin EC 81 MG EC tablet Take 1 tablet (81 mg total) by mouth daily. 08/28/16   Bettey Costa, MD  atorvastatin (LIPITOR) 40 MG tablet Take 1 tablet (40 mg total) by mouth daily at 6 PM. 10/08/16   Crecencio Mc, MD  budesonide-formoterol (SYMBICORT) 80-4.5 MCG/ACT inhaler Inhale 2 puffs into the lungs 2 (two) times daily. 10/08/16   Crecencio Mc, MD  clopidogrel (PLAVIX) 75 MG tablet Take 1 tablet (75 mg total) by mouth daily. 10/08/16   Crecencio Mc, MD  diazepam (VALIUM) 5 MG tablet Take 0.5 tablets (2.5 mg total) by mouth 2 (two) times daily as needed. 10/08/16   Crecencio Mc, MD  ferrous sulfate 325 (65 FE) MG tablet Take 1 tablet (325 mg total) by mouth daily with breakfast. 09/03/16   Lendon Colonel, NP  HYDROcodone-acetaminophen (NORCO/VICODIN) 5-325 MG tablet Take 1 tablet by mouth every 6 (six) hours as needed for moderate pain. Maximum 2 daily. 08/24/16   Crecencio Mc, MD  lisinopril (PRINIVIL,ZESTRIL) 20 MG tablet TAKE 1 TABLET BY MOUTH EVERY DAY 10/27/15   Crecencio Mc, MD  megestrol (MEGACE) 40 MG tablet Take 1 tablet (40 mg total) by mouth daily. 10/08/16   Crecencio Mc, MD  metoprolol tartrate (LOPRESSOR) 25  MG tablet Take 0.5 tablets (12.5 mg total) by mouth 2 (two) times daily. 08/27/16   Bettey Costa, MD  mirtazapine (REMERON) 45 MG tablet Take 1 tablet (45 mg total) by mouth at bedtime. 10/08/16   Crecencio Mc, MD  nitroGLYCERIN (NITROSTAT) 0.4 MG SL tablet Place 1 tablet (0.4 mg total) under the tongue every 5 (five) minutes x 3 doses as needed for chest pain. 09/28/16   Crecencio Mc, MD  pantoprazole (PROTONIX) 40 MG tablet Take 1 tablet (40 mg total) by mouth daily. 10/11/16   Lendon Colonel, NP  PARoxetine (PAXIL) 30 MG tablet Take 1 tablet (30 mg total) by mouth daily. To be refilled by PCP 10/08/16   Crecencio Mc, MD    Allergies Morphine sulfate  Family History  Problem Relation Age of Onset  . Coronary artery disease Mother   . Coronary artery disease Father 62  . Heart attack Father   . Cancer Neg Hx        no breast,coon or ovarian ca    Social History Social History  Substance Use Topics  . Smoking status: Current Every Day Smoker    Packs/day: 0.50  Types: Cigarettes  . Smokeless tobacco: Never Used     Comment: HAS BEEN SMOKING 30+ YEARS,RESUMED TOBACCO USE AFTER AAA REPAIR  . Alcohol use No    Review of Systems Constitutional: No fever/chills Eyes: No visual changes. ENT: No sore throat. Cardiovascular: Denies chest pain. Respiratory: Denies shortness of breath. Gastrointestinal: Positive abdominal pain.  Positive nausea, no vomiting.  Positive diarrhea.  No constipation. Genitourinary: Negative for dysuria. Musculoskeletal: Negative for back pain. Skin: Negative for rash. Neurological: Negative for headaches, focal weakness or numbness.   ____________________________________________   PHYSICAL EXAM:  VITAL SIGNS: ED Triage Vitals  Enc Vitals Group     BP 11/05/16 1822 (!) 115/50     Pulse Rate 11/05/16 1822 76     Resp 11/05/16 1822 18     Temp 11/05/16 1822 98.9 F (37.2 C)     Temp Source 11/05/16 1822 Oral     SpO2 11/05/16 1822 100 %      Weight 11/05/16 1827 120 lb (54.4 kg)     Height 11/05/16 1827 5\' 5"  (1.651 m)     Head Circumference --      Peak Flow --      Pain Score 11/05/16 1827 7     Pain Loc --      Pain Edu? --      Excl. in [place]? --     Constitutional: Alert and oriented 4 pleasant cooperative speaks in full clear sentences Eyes: PERRL EOMI. Head: Atraumatic. Nose: No congestion/rhinnorhea. Mouth/Throat: No trismus Neck: No stridor.   Cardiovascular: Normal rate, regular rhythm. Grossly normal heart sounds.  Good peripheral circulation. Respiratory: Normal respiratory effort.  No retractions. Lungs CTAB and moving good air Gastrointestinal: Convex abdomen soft mild tenderness left side well-healed surgical scars no palpable masses Rectal exam performed with female nurse chaperone Iris: Normal external exam guaiac-negative control positive brown stool Musculoskeletal: No lower extremity edema   Neurologic:  Normal speech and language. No gross focal neurologic deficits are appreciated. Skin:  Skin is warm, dry and intact. No rash noted. Psychiatric: Mood and affect are normal. Speech and behavior are normal.    ____________________________________________   DIFFERENTIAL includes but not limited to  Upper GI bleed, lower GI bleed, diverticulitis, Clostridium difficile ____________________________________________   LABS (all labs ordered are listed, but only abnormal results are displayed)  Labs Reviewed  CBC - Abnormal; Notable for the following:       Result Value   RBC 2.59 (*)    Hemoglobin 7.8 (*)    HCT 23.2 (*)    RDW 15.0 (*)    All other components within normal limits  LACTIC ACID, PLASMA  LIPASE, BLOOD  COMPREHENSIVE METABOLIC PANEL  URINALYSIS, COMPLETE (UACMP) WITH MICROSCOPIC  LACTIC ACID, PLASMA  TYPE AND SCREEN    Significant decline in hemoglobin __________________________________________  EKG   ____________________________________________  RADIOLOGY  CT  scan is pending ____________________________________________   PROCEDURES  Procedure(s) performed: no  Procedures  Critical Care performed: no  Observation: no ____________________________________________   INITIAL IMPRESSION / ASSESSMENT AND PLAN / ED COURSE  Pertinent labs & imaging results that were available during my care of the patient were reviewed by me and considered in my medical decision making (see chart for details).  The patient arrives with 3-4 weeks of diarrhea as well as acute kidney injury dehydration and anemia. She was recently begun on dual antiplatelet therapy for a non-ST elevation myocardial infarction less than 2 months ago. While she is guaiac-negative today  I'm concerned about her drop in hemoglobin and that she is symptomatic. Regarding her abdominal pain and diarrhea I am likewise concerned that she may have diverticulitis as her last colonoscopy confirmed diverticulosis. Fluids and CT scan are pending but regardless she will require inpatient admission for blood transfusion as we are unable to transfused type-specific blood in the emergency department.    ----------------------------------------- 8:46 PM on 11/05/2016 -----------------------------------------  Fortunately the patient's lactate is reassuring. CT scan is pending and care signed over to Dr. Archie Balboa who will follow up on the CT and likely admit the patient.  ____________________________________________   FINAL CLINICAL IMPRESSION(S) / ED DIAGNOSES  Final diagnoses:  Anemia, unspecified type  Left lower quadrant pain  Diarrhea, unspecified type      NEW MEDICATIONS STARTED DURING THIS VISIT:  New Prescriptions   No medications on file     Note:  This document was prepared using Dragon voice recognition software and may include unintentional dictation errors.     Darel Hong, MD 11/05/16 2046

## 2016-11-05 NOTE — Addendum Note (Signed)
Addended by: Leeanne Rio on: 11/05/2016 11:55 AM   Modules accepted: Orders

## 2016-11-05 NOTE — H&P (Signed)
[facility] NAME: Anna Prince    MR#:  [id number]  DATE OF BIRTH:  [birth year]  DATE OF ADMISSION:  11/05/2016  PRIMARY CARE PHYSICIAN: Crecencio Mc, MD   REQUESTING/REFERRING PHYSICIAN: Mable Paris, MD  CHIEF COMPLAINT:   Chief Complaint  Patient presents with  . Diarrhea  . Abdominal Pain    HISTORY OF PRESENT ILLNESS:  Anna Prince  is a 83 y.o. female who presents with 2 weeks of diarrhea, including some melanotic stool. She has had worsening abdominal pain for the last couple of days. Here in the ED we found colitis on imaging, and her hemoglobin has dropped to an after 3 points since her last value in our system. Hospitalists were called for admission  PAST MEDICAL HISTORY:   Past Medical History:  Diagnosis Date  . AAA (abdominal aortic aneurysm) ([place])   . Anemia 2012   of acute blood loss, resolved  . Anxiety   . Aortic aneurysm ([place])   . Arthritis   . Barrett's esophagus 2007  . Bursitis 2012   left hip, improved with periodic steroid injection [facility] [place] [facility])  . COPD (chronic obstructive pulmonary disease) ([place])   . Coronary artery disease   . Depression   . GERD (gastroesophageal reflux disease)    with Barretts Esophagus  . Hyperlipidemia   . Hypertension   . Lumbago   . Macular degeneration   . Osteoporosis   . Sigmoid diverticulosis    by colonoscopy  . Stroke [facility])     PAST SURGICAL HISTORY:   Past Surgical History:  Procedure Laterality Date  . ABDOMINAL AORTIC ANEURYSM REPAIR  July 2010   [facility]  . APPENDECTOMY    . BACK SURGERY     X2..1975 arachnoid cyst cervical region([place]);1997 lumbosacral tumor,9 hr surgery  . CARDIAC CATHETERIZATION    . CARDIAC CATHETERIZATION    . CORONARY ATHERECTOMY N/A 08/31/2016   Procedure: Coronary Atherectomy;  Surgeon: Nelva Bush, MD;  Location: [place] [facility];  Service: Cardiovascular;  Laterality: N/A;  .  ESOPHAGOGASTRODUODENOSCOPY (EGD) WITH PROPOFOL N/A 09/02/2016   Procedure: ESOPHAGOGASTRODUODENOSCOPY (EGD) WITH PROPOFOL;  Surgeon: Otis Brace, MD;  Location: [place];  Service: Gastroenterology;  Laterality: N/A;  . INTRAVASCULAR ULTRASOUND/IVUS N/A 08/31/2016   Procedure: Intravascular Ultrasound/IVUS;  Surgeon: Nelva Bush, MD;  Location: [place] [facility];  Service: Cardiovascular;  Laterality: N/A;  . LEFT HEART CATH AND CORONARY ANGIOGRAPHY N/A 08/27/2016   Procedure: Left Heart Cath and Coronary Angiography;  Surgeon: Teodoro Spray, MD;  Location: [place] [facility];  Service: Cardiovascular;  Laterality: N/A;    SOCIAL HISTORY:   Social History  Substance Use Topics  . Smoking status: Current Every Day Smoker    Packs/day: 0.50    Types: Cigarettes  . Smokeless tobacco: Never Used     Comment: HAS BEEN SMOKING 30+ YEARS,RESUMED TOBACCO USE AFTER AAA REPAIR  . Alcohol use No    FAMILY HISTORY:   Family History  Problem Relation Age of Onset  . Coronary artery disease Mother   . Coronary artery disease Father 56  . Heart attack Father   . Cancer Neg Hx        no breast,coon or ovarian ca    DRUG ALLERGIES:   Allergies  Allergen Reactions  . Morphine Sulfate Other (See Comments)    Reaction:  Hallucinations     MEDICATIONS AT HOME:   Prior to Admission medications  Medication Sig Start Date End Date Taking? Authorizing Provider  albuterol (PROVENTIL HFA;VENTOLIN HFA) 108 (90 Base) MCG/ACT inhaler Inhale 2 puffs into the lungs every 6 (six) hours as needed for wheezing or shortness of breath. 11/11/15  Yes Alfred Levins, [place], MD  aspirin EC 81 MG EC tablet Take 1 tablet (81 mg total) by mouth daily. 08/28/16  Yes Bettey Costa, MD  atorvastatin (LIPITOR) 40 MG tablet Take 1 tablet (40 mg total) by mouth daily at 6 PM. 10/08/16  Yes Crecencio Mc, MD  budesonide-formoterol (SYMBICORT) 80-4.5 MCG/ACT inhaler Inhale 2 puffs into the lungs 2 (two) times  daily. 10/08/16  Yes Crecencio Mc, MD  clopidogrel (PLAVIX) 75 MG tablet Take 1 tablet (75 mg total) by mouth daily. 10/08/16  Yes Crecencio Mc, MD  diazepam (VALIUM) 5 MG tablet Take 0.5 tablets (2.5 mg total) by mouth 2 (two) times daily as needed. 10/08/16  Yes Crecencio Mc, MD  ferrous sulfate 325 (65 FE) MG tablet Take 1 tablet (325 mg total) by mouth daily with breakfast. 09/03/16  Yes Lendon Colonel, NP  HYDROcodone-acetaminophen (NORCO/VICODIN) 5-325 MG tablet Take 1 tablet by mouth every 6 (six) hours as needed for moderate pain. Maximum 2 daily. 08/24/16  Yes Crecencio Mc, MD  lisinopril (PRINIVIL,ZESTRIL) 20 MG tablet TAKE 1 TABLET BY MOUTH EVERY DAY 10/27/15  Yes Crecencio Mc, MD  megestrol (MEGACE) 40 MG tablet Take 1 tablet (40 mg total) by mouth daily. 10/08/16  Yes Crecencio Mc, MD  metoprolol tartrate (LOPRESSOR) 25 MG tablet Take 0.5 tablets (12.5 mg total) by mouth 2 (two) times daily. 08/27/16  Yes Mody, Ulice Bold, MD  mirtazapine (REMERON) 45 MG tablet Take 1 tablet (45 mg total) by mouth at bedtime. 10/08/16  Yes Crecencio Mc, MD  nitroGLYCERIN (NITROSTAT) 0.4 MG SL tablet Place 1 tablet (0.4 mg total) under the tongue every 5 (five) minutes x 3 doses as needed for chest pain. 09/28/16  Yes Crecencio Mc, MD  pantoprazole (PROTONIX) 40 MG tablet Take 1 tablet (40 mg total) by mouth daily. 10/11/16  Yes Lendon Colonel, NP  PARoxetine (PAXIL) 30 MG tablet Take 1 tablet (30 mg total) by mouth daily. To be refilled by PCP 10/08/16  Yes Crecencio Mc, MD    REVIEW OF SYSTEMS:  Review of Systems  Constitutional: Negative for chills, fever, malaise/fatigue and weight loss.  HENT: Negative for ear pain, hearing loss and tinnitus.   Eyes: Negative for blurred vision, double vision, pain and redness.  Respiratory: Negative for cough, hemoptysis and shortness of breath.   Cardiovascular: Negative for chest pain, palpitations, orthopnea and leg swelling.   Gastrointestinal: Positive for abdominal pain, diarrhea and melena. Negative for constipation, nausea and vomiting.  Genitourinary: Negative for dysuria, frequency and hematuria.  Musculoskeletal: Negative for back pain, joint pain and neck pain.  Skin:       No acne, rash, or lesions  Neurological: Negative for dizziness, tremors, focal weakness and weakness.  Endo/Heme/Allergies: Negative for polydipsia. Does not bruise/bleed easily.  Psychiatric/Behavioral: Negative for depression. The patient is not nervous/anxious and does not have insomnia.      VITAL SIGNS:   Vitals:   11/05/16 1827 11/05/16 2049 11/05/16 2130 11/05/16 2200  BP:  (!) 121/59 (!) 118/91 127/64  Pulse:  72 66 66  Resp:  18    Temp:      TempSrc:      SpO2:  100% 92% 100%  Weight: 54.4  kg (120 lb)     Height: 5\' 5"  (1.651 m)      Wt Readings from Last 3 Encounters:  11/05/16 54.4 kg (120 lb)  09/06/16 55.6 kg (122 lb 9.6 oz)  09/05/16 55.3 kg (122 lb)    PHYSICAL EXAMINATION:  Physical Exam  Vitals reviewed. Constitutional: She is oriented to person, place, and time. She appears well-developed and well-nourished. No distress.  HENT:  Head: Normocephalic and atraumatic.  Mouth/Throat: Oropharynx is clear and moist.  Eyes: Pupils are equal, round, and reactive to light. Conjunctivae and EOM are normal. No scleral icterus.  Neck: Normal range of motion. Neck supple. No JVD present. No thyromegaly present.  Cardiovascular: Normal rate, regular rhythm and intact distal pulses.  Exam reveals no gallop and no friction rub.   No murmur heard. Respiratory: Effort normal and breath sounds normal. No respiratory distress. She has no wheezes. She has no rales.  GI: Soft. Bowel sounds are normal. She exhibits no distension. There is no tenderness.  Musculoskeletal: Normal range of motion. She exhibits no edema.  No arthritis, no gout  Lymphadenopathy:    She has no cervical adenopathy.  Neurological: She is  alert and oriented to person, place, and time. No cranial nerve deficit.  No dysarthria, no aphasia  Skin: Skin is warm and dry. No rash noted. No erythema.  Psychiatric: She has a normal mood and affect. Her behavior is normal. Judgment and thought content normal.    LABORATORY PANEL:   CBC  Recent Labs Lab 11/05/16 1825  WBC 5.4  HGB 7.8*  HCT 23.2*  PLT 229   ------------------------------------------------------------------------------------------------------------------  Chemistries   Recent Labs Lab 10/31/16 1322 11/05/16 1825  NA 137 138  K 4.3 3.9  CL 109 109  CO2 20 20*  GLUCOSE 109* 95  BUN 36* 24*  CREATININE 1.75* 1.61*  CALCIUM 9.1 8.9  MG 1.5  --   AST 10 18  ALT 7 11*  ALKPHOS 62 66  BILITOT 0.3 0.3   ------------------------------------------------------------------------------------------------------------------  Cardiac Enzymes No results for input(s): TROPONINI in the last 168 hours. ------------------------------------------------------------------------------------------------------------------  RADIOLOGY:  Ct Abdomen Pelvis Wo Contrast  Result Date: 11/05/2016 CLINICAL DATA:  83 y/o F; 3-4 weeks of diffuse cramping abdominal discomfort and daily diarrhea. EXAM: CT ABDOMEN AND PELVIS WITHOUT CONTRAST TECHNIQUE: Multidetector CT imaging of the abdomen and pelvis was performed following the standard protocol without IV contrast. COMPARISON:  04/17/2014 CT abdomen and pelvis. FINDINGS: Lower chest: Stable moderate hiatal hernia. Hepatobiliary: Cyst in left lobe of liver has increased in size measuring 46 mm. No new focal liver lesion. Normal gallbladder. No intra or extrahepatic biliary ductal dilatation. Pancreas: Unremarkable. No pancreatic ductal dilatation or surrounding inflammatory changes. Spleen: Normal in size without focal abnormality. Adrenals/Urinary Tract: Punctate calcification in right kidney hilum is likely vascular. Normal adrenal  glands. No hydronephrosis. Normal bladder. Stomach/Bowel: No obstructive changes of bowel. Mild fat stranding surrounding the ascending colon and cecum and thickening of the right pericolic fascia. Extensive sigmoid diverticulosis. Vascular/Lymphatic: Suprarenal abdominal aortic ectasia measuring 3.0 cm is stable. Severe extensive calcific atherosclerosis of the abdominal aorta, iliofemoral arteries, and major branches. Reproductive: Uterus and bilateral adnexa are unremarkable. Other: No abdominal wall hernia or abnormality. No abdominopelvic ascites. Musculoskeletal: Moderate lumbar levocurvature with apex at L2. Stable moderate anterior compression deformity of L1 vertebral body. Stable advanced lumbar degenerative changes. Stable grade 1 L5-S1 anterolisthesis and chronic right-sided L5 pars defect. IMPRESSION: 1. Mild fat stranding surrounding ascending colon and cecum  with thickening of right pericolic fascia probably represents a mild underlying ascending colitis. 2. Stable moderate hiatal hernia. 3. Cyst in left lobe of liver has increased in size to 46 mm. 4. Sigmoid diverticulosis without appreciable acute diverticulitis. 5. Suprarenal abdominal aortic ectasia to 3.0 cm. Recommend followup by ultrasound in 3 years. This recommendation follows ACR consensus guidelines: White Paper of the ACR Incidental Findings Committee II on Vascular Findings. J Am Coll Radiol 2013; 10:789-794. 6. Stable advanced lumbar degenerative changes, levocurvature, L1 compression deformity, and grade 1 L5-S1 anterolisthesis. Electronically Signed   By: Kristine Garbe M.D.   On: 11/05/2016 22:54    EKG:   Orders placed or performed in visit on 09/05/16  . EKG 12-Lead    IMPRESSION AND PLAN:  Principal Problem:   Colitis - IV Zosyn, colitis is likely cause of her possible GI bleed, however we will get GI to consult Active Problems:   Anemia - likely due to her intermittent GI bleed, GI consult   Hypertension -  continue home meds   Coronary artery disease - home medications   Hyperlipidemia - home dose anti-lipid   Anxiety - home dose anxiolytic   GERD (gastroesophageal reflux disease) - PPI  All the records are reviewed and case discussed with ED provider. Management plans discussed with the patient and/or family.  DVT PROPHYLAXIS: Mechanical only  GI PROPHYLAXIS: PPI  ADMISSION STATUS: Inpatient  CODE STATUS: Full Code Status History    Date Active Date Inactive Code Status Order ID Comments User Context   08/28/2016 10:29 AM 09/02/2016  6:23 PM Full Code [id number]  Nani Skillern, PA-C Inpatient   08/24/2016  6:13 PM 08/28/2016  8:47 AM Full Code [id number]  Demetrios Loll, MD Inpatient   04/15/2015  1:59 AM 04/18/2015  5:49 PM Full Code [id number]  Lance Coon, MD Inpatient   04/09/2015  6:42 AM 04/10/2015  3:41 PM Full Code [id number]  Harrie Foreman, MD Inpatient   02/03/2015  7:51 AM 02/04/2015  2:33 PM DNR [id number]  Lance Coon, MD Inpatient    Advance Directive Documentation     Most Recent Value  Type of Advance Directive  Healthcare Power of [place], Living will  Pre-existing out of facility DNR order (yellow form or pink MOST form)  -  "MOST" Form in Place?  -      TOTAL TIME TAKING CARE OF THIS PATIENT: 45 minutes.   Jamerson Vonbargen Melba 11/05/2016, 11:43 PM  [facility]  Office  [phone]  CC: Primary care physician; Crecencio Mc, MD  Note:  This document was prepared using Dragon voice recognition software and may include unintentional dictation errors.

## 2016-11-05 NOTE — ED Triage Notes (Signed)
Pt reports low abdominal pain and watery diarrhea for four weeks. Pt reports some nausea the past few days but denies vomiting.

## 2016-11-06 ENCOUNTER — Inpatient Hospital Stay: Payer: Medicare Other | Admitting: Anesthesiology

## 2016-11-06 ENCOUNTER — Encounter
Admission: EM | Disposition: A | Discharge status: Home / Self Care | Payer: Self-pay | Source: Home / Self Care | Attending: Specialist

## 2016-11-06 DIAGNOSIS — K529 Noninfective gastroenteritis and colitis, unspecified: Secondary | ICD-10-CM

## 2016-11-06 DIAGNOSIS — R197 Diarrhea, unspecified: Secondary | ICD-10-CM

## 2016-11-06 DIAGNOSIS — D500 Iron deficiency anemia secondary to blood loss (chronic): Secondary | ICD-10-CM

## 2016-11-06 DIAGNOSIS — K253 Acute gastric ulcer without hemorrhage or perforation: Secondary | ICD-10-CM

## 2016-11-06 HISTORY — PX: ESOPHAGOGASTRODUODENOSCOPY (EGD) WITH PROPOFOL: SHX5813

## 2016-11-06 LAB — BASIC METABOLIC PANEL
Anion gap: 3 — ABNORMAL LOW (ref 5–15)
BUN: 21 mg/dL — AB (ref 6–20)
CHLORIDE: 114 mmol/L — AB (ref 101–111)
CO2: 22 mmol/L (ref 22–32)
Calcium: 8.1 mg/dL — ABNORMAL LOW (ref 8.9–10.3)
Creatinine, Ser: 1.57 mg/dL — ABNORMAL HIGH (ref 0.44–1.00)
GFR calc Af Amer: 34 mL/min — ABNORMAL LOW (ref >60)
GFR calc non Af Amer: 30 mL/min — ABNORMAL LOW (ref >60)
Glucose, Bld: 94 mg/dL (ref 65–99)
Potassium: 3.9 mmol/L (ref 3.5–5.1)
SODIUM: 139 mmol/L (ref 135–145)

## 2016-11-06 LAB — CBC
HCT: 19.4 % — ABNORMAL LOW (ref 35.0–47.0)
Hemoglobin: 6.6 g/dL — ABNORMAL LOW (ref 12.0–16.0)
MCH: 30.2 pg (ref 26.0–34.0)
MCHC: 33.7 g/dL (ref 32.0–36.0)
MCV: 89.7 fL (ref 80.0–100.0)
Platelets: 183 10*3/uL (ref 150–440)
RBC: 2.17 MIL/uL — ABNORMAL LOW (ref 3.80–5.20)
RDW: 15.2 % — ABNORMAL HIGH (ref 11.5–14.5)
WBC: 4.2 10*3/uL (ref 3.6–11.0)

## 2016-11-06 LAB — PREPARE RBC (CROSSMATCH)

## 2016-11-06 LAB — ABO/RH: ABO/RH(D): O POS

## 2016-11-06 LAB — CLOSTRIDIUM DIFFICILE BY PCR: Toxigenic C. Difficile by PCR: NOT DETECTED

## 2016-11-06 LAB — HEMOGLOBIN AND HEMATOCRIT, BLOOD
HCT: 25.8 % — ABNORMAL LOW (ref 35.0–47.0)
HEMOGLOBIN: 8.7 g/dL — AB (ref 12.0–16.0)

## 2016-11-06 SURGERY — ESOPHAGOGASTRODUODENOSCOPY (EGD) WITH PROPOFOL
Anesthesia: General

## 2016-11-06 MED ORDER — PIPERACILLIN-TAZOBACTAM 3.375 G IVPB 30 MIN
INTRAVENOUS | Status: AC
  Filled 2016-11-06: qty 50

## 2016-11-06 MED ORDER — ONDANSETRON HCL 4 MG PO TABS: 4.0000 mg | ORAL_TABLET | Freq: Four times a day (QID) | ORAL | Status: DC | PRN

## 2016-11-06 MED ORDER — METOPROLOL TARTRATE 25 MG PO TABS
12.5000 mg | ORAL_TABLET | Freq: Two times a day (BID) | ORAL | Status: DC
  Administered 2016-11-06 – 2016-11-08 (×4): 12.5 mg via ORAL
  Filled 2016-11-06 (×5): qty 1

## 2016-11-06 MED ORDER — PAROXETINE HCL 20 MG PO TABS
30.0000 mg | ORAL_TABLET | Freq: Every day | ORAL | Status: DC
  Administered 2016-11-07 – 2016-11-08 (×2): 30 mg via ORAL
  Filled 2016-11-06 (×2): qty 2

## 2016-11-06 MED ORDER — MIRTAZAPINE 15 MG PO TABS
45.0000 mg | ORAL_TABLET | Freq: Every day | ORAL | Status: DC
  Administered 2016-11-06 – 2016-11-07 (×2): 45 mg via ORAL
  Filled 2016-11-06 (×2): qty 3

## 2016-11-06 MED ORDER — SODIUM CHLORIDE 0.9 % IV SOLN
Freq: Once | INTRAVENOUS | Status: AC
  Administered 2016-11-06: 08:00:00 via INTRAVENOUS

## 2016-11-06 MED ORDER — SODIUM CHLORIDE 0.9 % IV SOLN
INTRAVENOUS | Status: AC
  Administered 2016-11-06 (×2): via INTRAVENOUS

## 2016-11-06 MED ORDER — ONDANSETRON HCL 4 MG/2ML IJ SOLN: 4.0000 mg | Freq: Four times a day (QID) | INTRAMUSCULAR | Status: DC | PRN

## 2016-11-06 MED ORDER — SODIUM CHLORIDE 0.9 % IV SOLN
Freq: Once | INTRAVENOUS | Status: AC
  Administered 2016-11-06: 15:00:00 via INTRAVENOUS

## 2016-11-06 MED ORDER — LIDOCAINE HCL (PF) 1 % IJ SOLN
INTRAMUSCULAR | Status: AC
  Filled 2016-11-06: qty 2

## 2016-11-06 MED ORDER — ACETAMINOPHEN 325 MG PO TABS: 650.0000 mg | ORAL_TABLET | Freq: Four times a day (QID) | ORAL | Status: DC | PRN

## 2016-11-06 MED ORDER — LISINOPRIL 20 MG PO TABS
20.0000 mg | ORAL_TABLET | Freq: Every day | ORAL | Status: DC
  Administered 2016-11-07 – 2016-11-08 (×2): 20 mg via ORAL
  Filled 2016-11-06 (×2): qty 1

## 2016-11-06 MED ORDER — MOMETASONE FURO-FORMOTEROL FUM 100-5 MCG/ACT IN AERO
2.0000 | INHALATION_SPRAY | Freq: Two times a day (BID) | RESPIRATORY_TRACT | Status: DC
  Administered 2016-11-06 – 2016-11-08 (×5): 2 via RESPIRATORY_TRACT
  Filled 2016-11-06 (×2): qty 8.8

## 2016-11-06 MED ORDER — PROPOFOL 10 MG/ML IV BOLUS
INTRAVENOUS | Status: DC | PRN
  Administered 2016-11-06: 30 mg via INTRAVENOUS
  Administered 2016-11-06: 20 mg via INTRAVENOUS

## 2016-11-06 MED ORDER — PANTOPRAZOLE SODIUM 40 MG PO TBEC: 40.0000 mg | DELAYED_RELEASE_TABLET | Freq: Every day | ORAL | Status: DC

## 2016-11-06 MED ORDER — PROPOFOL 10 MG/ML IV BOLUS
INTRAVENOUS | Status: AC
  Filled 2016-11-06: qty 20

## 2016-11-06 MED ORDER — HYDROCODONE-ACETAMINOPHEN 5-325 MG PO TABS: 1.0000 | ORAL_TABLET | Freq: Two times a day (BID) | ORAL | Status: DC | PRN

## 2016-11-06 MED ORDER — PANTOPRAZOLE SODIUM 40 MG PO TBEC
40.0000 mg | DELAYED_RELEASE_TABLET | Freq: Every day | ORAL | Status: DC
  Administered 2016-11-07 – 2016-11-08 (×2): 40 mg via ORAL
  Filled 2016-11-06 (×2): qty 1

## 2016-11-06 MED ORDER — SODIUM CHLORIDE 0.9 % IV SOLN
INTRAVENOUS | Status: DC
  Administered 2016-11-06: 1000 mL via INTRAVENOUS

## 2016-11-06 MED ORDER — PROPOFOL 500 MG/50ML IV EMUL
INTRAVENOUS | Status: DC | PRN
  Administered 2016-11-06: 100 ug/kg/min via INTRAVENOUS

## 2016-11-06 MED ORDER — DIAZEPAM 5 MG PO TABS
2.5000 mg | ORAL_TABLET | Freq: Two times a day (BID) | ORAL | Status: DC | PRN
Start: 2016-11-06 — End: 2016-11-08

## 2016-11-06 MED ORDER — ACETAMINOPHEN 650 MG RE SUPP: 650.0000 mg | Freq: Four times a day (QID) | RECTAL | Status: DC | PRN

## 2016-11-06 MED ORDER — PIPERACILLIN-TAZOBACTAM 3.375 G IVPB
3.3750 g | Freq: Three times a day (TID) | INTRAVENOUS | Status: DC
  Administered 2016-11-06: 3.375 g via INTRAVENOUS

## 2016-11-06 MED ORDER — ASPIRIN EC 81 MG PO TBEC
81.0000 mg | DELAYED_RELEASE_TABLET | Freq: Every day | ORAL | Status: DC
  Administered 2016-11-07 – 2016-11-08 (×2): 81 mg via ORAL
  Filled 2016-11-06 (×2): qty 1

## 2016-11-06 MED ORDER — ATORVASTATIN CALCIUM 20 MG PO TABS
40.0000 mg | ORAL_TABLET | Freq: Every day | ORAL | Status: DC
  Administered 2016-11-06 – 2016-11-07 (×2): 40 mg via ORAL
  Filled 2016-11-06 (×2): qty 2

## 2016-11-06 NOTE — Consult Note (Signed)
Lucilla Lame, MD [facility]  [address] [place], [place] [address] Phone: [phone] Fax : [phone]  Consultation  Referring Provider:     Dr. Jannifer Franklin Primary Care Physician:  Crecencio Mc, MD Primary Gastroenterologist:  Althia Forts         Reason for Consultation:     Anemia and diarrhea  Date of Admission:  11/05/2016 Date of Consultation:  11/06/2016         HPI:   Anna Prince is a 83 y.o. female who was admitted with a history of anemia with diarrhea for the last 3 weeks. The patient also reports that she's had some black stools. The patient has had a history of ulcers in the past and had an upper endoscopy at [facility] in May. The patient was found at that time to have anemia and now is admitted with a drop of hemoglobin 3.. The patient had stools checked last month for blood by her primary care provider which were shown to be negative. The patient also had a CT scan that showed inflammation of the right side of the colon consistent with possible ischemia versus infection. The patient had a similar CT scan findings back in 2015. The patient reports that she has not had any diarrhea today and only had one episode of diarrhea yesterday. There is no report of any nausea or vomiting. The patient recently had 2 cardiac stents placed and she is now on anticoagulation. I'm now being asked to see the patient for possible GI bleed and anemia with colitis.  Past Medical History:  Diagnosis Date  . AAA (abdominal aortic aneurysm) ([place])   . Anemia 2012   of acute blood loss, resolved  . Anxiety   . Aortic aneurysm ([place])   . Arthritis   . Barrett's esophagus 2007  . Bursitis 2012   left hip, improved with periodic steroid injection [facility])  . COPD (chronic obstructive pulmonary disease) ([place])   . Coronary artery disease   . Depression   . GERD (gastroesophageal reflux disease)    with Barretts Esophagus  . Hyperlipidemia   . Hypertension   .  Lumbago   . Macular degeneration   . Osteoporosis   . Sigmoid diverticulosis    by colonoscopy  . Stroke [facility])     Past Surgical History:  Procedure Laterality Date  . ABDOMINAL AORTIC ANEURYSM REPAIR  July 2010   [facility]  . APPENDECTOMY    . BACK SURGERY     X2..1975 arachnoid cyst cervical region([place],g[place]);1997 lumbosacral tumor,9 hr surgery  . CARDIAC CATHETERIZATION    . CARDIAC CATHETERIZATION    . CORONARY ATHERECTOMY N/A 08/31/2016   Procedure: Coronary Atherectomy;  Surgeon: Nelva Bush, MD;  Location: [place] [facility];  Service: Cardiovascular;  Laterality: N/A;  . ESOPHAGOGASTRODUODENOSCOPY (EGD) WITH PROPOFOL N/A 09/02/2016   Procedure: ESOPHAGOGASTRODUODENOSCOPY (EGD) WITH PROPOFOL;  Surgeon: Otis Brace, MD;  Location: [place];  Service: Gastroenterology;  Laterality: N/A;  . INTRAVASCULAR ULTRASOUND/IVUS N/A 08/31/2016   Procedure: Intravascular Ultrasound/IVUS;  Surgeon: Nelva Bush, MD;  Location: [place] [facility];  Service: Cardiovascular;  Laterality: N/A;  . LEFT HEART CATH AND CORONARY ANGIOGRAPHY N/A 08/27/2016   Procedure: Left Heart Cath and Coronary Angiography;  Surgeon: Teodoro Spray, MD;  Location: [place] [facility];  Service: Cardiovascular;  Laterality: N/A;    Prior to Admission medications   Medication Sig Start Date End Date Taking? Authorizing Provider  albuterol (PROVENTIL HFA;VENTOLIN  HFA) 108 (90 Base) MCG/ACT inhaler Inhale 2 puffs into the lungs every 6 (six) hours as needed for wheezing or shortness of breath. 11/11/15  Yes Alfred Levins, [place], MD  aspirin EC 81 MG EC tablet Take 1 tablet (81 mg total) by mouth daily. 08/28/16  Yes Bettey Costa, MD  atorvastatin (LIPITOR) 40 MG tablet Take 1 tablet (40 mg total) by mouth daily at 6 PM. 10/08/16  Yes Crecencio Mc, MD  budesonide-formoterol (SYMBICORT) 80-4.5 MCG/ACT inhaler Inhale 2 puffs into the lungs 2 (two) times daily. 10/08/16  Yes Crecencio Mc, MD  clopidogrel  (PLAVIX) 75 MG tablet Take 1 tablet (75 mg total) by mouth daily. 10/08/16  Yes Crecencio Mc, MD  diazepam (VALIUM) 5 MG tablet Take 0.5 tablets (2.5 mg total) by mouth 2 (two) times daily as needed. 10/08/16  Yes Crecencio Mc, MD  ferrous sulfate 325 (65 FE) MG tablet Take 1 tablet (325 mg total) by mouth daily with breakfast. 09/03/16  Yes Lendon Colonel, NP  HYDROcodone-acetaminophen (NORCO/VICODIN) 5-325 MG tablet Take 1 tablet by mouth every 6 (six) hours as needed for moderate pain. Maximum 2 daily. 08/24/16  Yes Crecencio Mc, MD  lisinopril (PRINIVIL,ZESTRIL) 20 MG tablet TAKE 1 TABLET BY MOUTH EVERY DAY 10/27/15  Yes Crecencio Mc, MD  megestrol (MEGACE) 40 MG tablet Take 1 tablet (40 mg total) by mouth daily. 10/08/16  Yes Crecencio Mc, MD  metoprolol tartrate (LOPRESSOR) 25 MG tablet Take 0.5 tablets (12.5 mg total) by mouth 2 (two) times daily. 08/27/16  Yes Mody, Ulice Bold, MD  mirtazapine (REMERON) 45 MG tablet Take 1 tablet (45 mg total) by mouth at bedtime. 10/08/16  Yes Crecencio Mc, MD  nitroGLYCERIN (NITROSTAT) 0.4 MG SL tablet Place 1 tablet (0.4 mg total) under the tongue every 5 (five) minutes x 3 doses as needed for chest pain. 09/28/16  Yes Crecencio Mc, MD  pantoprazole (PROTONIX) 40 MG tablet Take 1 tablet (40 mg total) by mouth daily. 10/11/16  Yes Lendon Colonel, NP  PARoxetine (PAXIL) 30 MG tablet Take 1 tablet (30 mg total) by mouth daily. To be refilled by PCP 10/08/16  Yes Crecencio Mc, MD    Family History  Problem Relation Age of Onset  . Coronary artery disease Mother   . Coronary artery disease Father 60  . Heart attack Father   . Cancer Neg Hx        no breast,coon or ovarian ca     Social History  Substance Use Topics  . Smoking status: Current Every Day Smoker    Packs/day: 0.50    Types: Cigarettes  . Smokeless tobacco: Never Used     Comment: HAS BEEN SMOKING 30+ YEARS,RESUMED TOBACCO USE AFTER AAA REPAIR  . Alcohol use No     Allergies as of 11/05/2016 - Review Complete 11/05/2016  Allergen Reaction Noted  . Morphine sulfate Other (See Comments) 12/08/2010    Review of Systems:    All systems reviewed and negative except where noted in HPI.   Physical Exam:  Vital signs in last 24 hours: Temp:  [98.4 F (36.9 C)-98.9 F (37.2 C)] 98.4 F (36.9 C) (07/17 1019) Pulse Rate:  [57-77] 57 (07/17 1019) Resp:  [12-20] 20 (07/17 1019) BP: (94-127)/(50-91) 102/79 (07/17 1019) SpO2:  [92 %-100 %] 100 % (07/17 1019) Weight:  [120 lb (54.4 kg)] 120 lb (54.4 kg) (07/16 1827) Last BM Date: 11/05/16 General:   Pleasant, cooperative in  NAD Head:  Normocephalic and atraumatic. Eyes:   No icterus.   Conjunctiva pink. PERRLA. Ears:  Normal auditory acuity. Neck:  Supple; no masses or thyroidomegaly Lungs: Respirations even and unlabored. Lungs clear to auscultation bilaterally.   No wheezes, crackles, or rhonchi.  Heart:  Regular rate and rhythm;  Without murmur, clicks, rubs or gallops Abdomen:  Soft, nondistended, nontender. Normal bowel sounds. No appreciable masses or hepatomegaly.  No rebound or guarding.  Rectal:  Not performed. Msk:  Symmetrical without gross deformities.    Extremities:  Without edema, cyanosis or clubbing. Neurologic:  Alert and oriented x3;  grossly normal neurologically. Skin:  Intact without significant lesions or rashes. Cervical Nodes:  No significant cervical adenopathy. Psych:  Alert and cooperative. Normal affect.  LAB RESULTS:  Recent Labs  11/05/16 1825 11/06/16 0444  WBC 5.4 4.2  HGB 7.8* 6.6*  HCT 23.2* 19.4*  PLT 229 183   BMET  Recent Labs  11/05/16 1825 11/06/16 0444  NA 138 139  K 3.9 3.9  CL 109 114*  CO2 20* 22  GLUCOSE 95 94  BUN 24* 21*  CREATININE 1.61* 1.57*  CALCIUM 8.9 8.1*   LFT  Recent Labs  11/05/16 1825  PROT 6.3*  ALBUMIN 3.2*  AST 18  ALT 11*  ALKPHOS 66  BILITOT 0.3   PT/INR No results for input(s): LABPROT, INR in the  last 72 hours.  STUDIES: Ct Abdomen Pelvis Wo Contrast  Result Date: 11/05/2016 CLINICAL DATA:  83 y/o F; 3-4 weeks of diffuse cramping abdominal discomfort and daily diarrhea. EXAM: CT ABDOMEN AND PELVIS WITHOUT CONTRAST TECHNIQUE: Multidetector CT imaging of the abdomen and pelvis was performed following the standard protocol without IV contrast. COMPARISON:  04/17/2014 CT abdomen and pelvis. FINDINGS: Lower chest: Stable moderate hiatal hernia. Hepatobiliary: Cyst in left lobe of liver has increased in size measuring 46 mm. No new focal liver lesion. Normal gallbladder. No intra or extrahepatic biliary ductal dilatation. Pancreas: Unremarkable. No pancreatic ductal dilatation or surrounding inflammatory changes. Spleen: Normal in size without focal abnormality. Adrenals/Urinary Tract: Punctate calcification in right kidney hilum is likely vascular. Normal adrenal glands. No hydronephrosis. Normal bladder. Stomach/Bowel: No obstructive changes of bowel. Mild fat stranding surrounding the ascending colon and cecum and thickening of the right pericolic fascia. Extensive sigmoid diverticulosis. Vascular/Lymphatic: Suprarenal abdominal aortic ectasia measuring 3.0 cm is stable. Severe extensive calcific atherosclerosis of the abdominal aorta, iliofemoral arteries, and major branches. Reproductive: Uterus and bilateral adnexa are unremarkable. Other: No abdominal wall hernia or abnormality. No abdominopelvic ascites. Musculoskeletal: Moderate lumbar levocurvature with apex at L2. Stable moderate anterior compression deformity of L1 vertebral body. Stable advanced lumbar degenerative changes. Stable grade 1 L5-S1 anterolisthesis and chronic right-sided L5 pars defect. IMPRESSION: 1. Mild fat stranding surrounding ascending colon and cecum with thickening of right pericolic fascia probably represents a mild underlying ascending colitis. 2. Stable moderate hiatal hernia. 3. Cyst in left lobe of liver has increased in  size to 46 mm. 4. Sigmoid diverticulosis without appreciable acute diverticulitis. 5. Suprarenal abdominal aortic ectasia to 3.0 cm. Recommend followup by ultrasound in 3 years. This recommendation follows ACR consensus guidelines: White Paper of the ACR Incidental Findings Committee II on Vascular Findings. J Am Coll Radiol 2013; 10:789-794. 6. Stable advanced lumbar degenerative changes, levocurvature, L1 compression deformity, and grade 1 L5-S1 anterolisthesis. Electronically Signed   By: Kristine Garbe M.D.   On: 11/05/2016 22:54      Impression / Plan:   Anna Prince  is a 83 y.o. y/o female with Findings consistent with right-sided colitis. This is not much different than the findings on her CT scan of 2015. The patient had a follow-up colonoscopy in 2016 by Dr. Candace Cruise which showed complete resolution of her colitis. This is most likely consistent with ischemic colitis. The patient will be set up for an EGD today due to her intermittent dark stools and her profound anemia. The patient is a 32 being transfused at this time. I have discussed risks & benefits which include, but are not limited to, bleeding, infection, perforation & drug reaction.  The patient agrees with this plan & written consent will be obtained.     Thank you for involving me in the care of this patient.      LOS: 1 day   Lucilla Lame, MD  11/06/2016, 11:21 AM   Note: This dictation was prepared with Dragon dictation along with smaller phrase technology. Any transcriptional errors that result from this process are unintentional.

## 2016-11-06 NOTE — Progress Notes (Signed)
Initial Nutrition Assessment  DOCUMENTATION CODES:   Not applicable  INTERVENTION:   RD will order supplements when diet advanced   NUTRITION DIAGNOSIS:   Inadequate oral intake related to acute illness as evidenced by NPO status  GOAL:   Patient will meet greater than or equal to 90% of their needs  MONITOR:   Diet advancement, Labs, Weight trends, I & O's  REASON FOR ASSESSMENT:   Malnutrition Screening Tool    ASSESSMENT:   83 y.o. female with h/o diverticulitis presents with 2 weeks of diarrhea, including some melanotic stool. She has had worsening abdominal pain for the last couple of days. Here in the ED we found colitis on imaging, and her hemoglobin has dropped to an after 3 points since her last value in our system.    Unable to see patient x 2 attempts today; pt having EGD at time of visit. Per chart, pt has been having diarrhea for several weeks pta. Pt noted to have colitis on CT scan and underwent EGD today and was found to have duodenitis and large hiatal hernia. Per chart, pt is weight stable; RD suspects good appetite and oral intake pta. Pt currently NPO; RD will order supplements when diet advanced. RD will get nutrition related history and exam at follow up.   Medications reviewed and include: aspirin, remeron, protonix, paxil, zosyn  Labs reviewed: Cl 114(H), BUN 21(H), creat 1.57(H), Ca 8.1(L) Hgb 6.6(L), Hct 19.4(L)  Unable to complete Nutrition-Focused physical exam at this time.   Diet Order:  Diet NPO time specified  Skin:  Reviewed, no issues  Last BM:  7/16  Height:   Ht Readings from Last 1 Encounters:  11/05/16 5\' 5"  (1.651 m)    Weight:   Wt Readings from Last 1 Encounters:  11/05/16 120 lb (54.4 kg)    Ideal Body Weight:  56.8 kg  BMI:  Body mass index is 19.97 kg/m.  Estimated Nutritional Needs:   Kcal:  1500-1800kcal/day   Protein:  71-82g/day   Fluid:  >1.5L/day   EDUCATION NEEDS:   No education needs  identified at this time  Koleen Distance MS, RD, [place] Pager #[phone] After Hours Pager: [phone]

## 2016-11-06 NOTE — Op Note (Addendum)
[facility] Gastroenterology Patient Name: Anna Prince Procedure Date: 11/06/2016 12:26 PM MRN: [id number] Account #: [id number] Date of Birth: 05-25-1931 Admit Type: Inpatient Age: 83 Room: [facility] ENDO ROOM 4 Gender: Female Note Status: Finalized Procedure:            Upper GI endoscopy Indications:          Iron deficiency anemia Providers:            Lucilla Lame MD, MD Referring MD:         Deborra Medina, MD (Referring MD) Medicines:            Propofol per Anesthesia Complications:        No immediate complications. Procedure:            Pre-Anesthesia Assessment:                       - Prior to the procedure, a History and Physical was                        performed, and patient medications and allergies were                        reviewed. The patient's tolerance of previous                        anesthesia was also reviewed. The risks and benefits of                        the procedure and the sedation options and risks were                        discussed with the patient. All questions were                        answered, and informed consent was obtained. Prior                        Anticoagulants: The patient has taken no previous                        anticoagulant or antiplatelet agents. ASA Grade                        Assessment: II - A patient with mild systemic disease.                        After reviewing the risks and benefits, the patient was                        deemed in satisfactory condition to undergo the                        procedure.                       After obtaining informed consent, the endoscope was                        passed under direct vision. Throughout the procedure,  the patient's blood pressure, pulse, and oxygen                        saturations were monitored continuously. The                        Colonoscope was introduced through the mouth, and   advanced to the second part of duodenum. The upper GI                        endoscopy was accomplished without difficulty. The                        patient tolerated the procedure well. Findings:      A large hiatal hernia was present.      Few non-bleeding cratered gastric ulcers with pigmented material were       found in the gastric body.      Localized mild inflammation characterized by erythema was found in the       duodenal bulb. Impression:           - Large hiatal hernia.                       - Non-bleeding gastric ulcers with pigmented material.                       - Duodenitis.                       - No specimens collected. Recommendation:       - Return patient to hospital ward for ongoing care.                       - Use a proton pump inhibitor PO BID. Procedure Code(s):    --- Professional ---                       [phone], Esophagogastroduodenoscopy, flexible, transoral;                        diagnostic, including collection of specimen(s) by                        brushing or washing, when performed (separate procedure) Diagnosis Code(s):    --- Professional ---                       D50.9, Iron deficiency anemia, unspecified                       K25.9, Gastric ulcer, unspecified as acute or chronic,                        without hemorrhage or perforation CPT copyright 2016 American Medical Association. All rights reserved. The codes documented in this report are preliminary and upon coder review may  be revised to meet current compliance requirements. Lucilla Lame MD, MD 11/06/2016 12:38:29 PM This report has been signed electronically. Number of Addenda: 0 Note Initiated On: 11/06/2016 12:26 PM      [facility]

## 2016-11-06 NOTE — Transfer of Care (Signed)
Immediate Anesthesia Transfer of Care Note  Patient: Anna Prince  Procedure(s) Performed: Procedure(s): ESOPHAGOGASTRODUODENOSCOPY (EGD) WITH PROPOFOL (N/A)  Patient Location: PACU and Endoscopy Unit  Anesthesia Type:General  Level of Consciousness: sedated  Airway & Oxygen Therapy: Patient Spontanous Breathing and Patient connected to nasal cannula oxygen  Post-op Assessment: Report given to RN and Post -op Vital signs reviewed and stable  Post vital signs: Reviewed and stable  Last Vitals:  Vitals:   11/06/16 1243 11/06/16 1244  BP: (!) 107/58 (!) 107/58  Pulse:  66  Resp: 18 16  Temp: 36.6 C 74.1 C    Complications: No apparent anesthesia complications

## 2016-11-06 NOTE — Anesthesia Preprocedure Evaluation (Signed)
Anesthesia Evaluation  Patient identified by MRN, date of birth, ID band Patient awake    Reviewed: Allergy & Precautions  Airway Mallampati: II       Dental  (+) Upper Dentures, Partial Lower   Pulmonary COPD, Current Smoker,     + decreased breath sounds      Cardiovascular Exercise Tolerance: Good hypertension, Pt. on medications + CAD, + Past MI, + Cardiac Stents and + Peripheral Vascular Disease   Rhythm:Regular     Neuro/Psych Anxiety Depression CVA    GI/Hepatic Neg liver ROS, PUD, GERD  Medicated,(+)     (-) substance abuse  ,   Endo/Other    Renal/GU      Musculoskeletal   Abdominal Normal abdominal exam  (+)   Peds  Hematology  (+) anemia ,   Anesthesia Other Findings   Reproductive/Obstetrics                             Anesthesia Physical Anesthesia Plan  ASA: III  Anesthesia Plan: General   Post-op Pain Management:    Induction: Intravenous  PONV Risk Score and Plan: 0  Airway Management Planned: Natural Airway and Nasal Cannula  Additional Equipment:   Intra-op Plan:   Post-operative Plan:   Informed Consent: I have reviewed the patients History and Physical, chart, labs and discussed the procedure including the risks, benefits and alternatives for the proposed anesthesia with the patient or authorized representative who has indicated his/her understanding and acceptance.     Plan Discussed with: CRNA  Anesthesia Plan Comments:         Anesthesia Quick Evaluation

## 2016-11-06 NOTE — Progress Notes (Signed)
[place] [facility] [place] NAME: Anna Prince    MR#:  [id number]  DATE OF BIRTH:  1932/01/02  SUBJECTIVE:   Patient here due to profound weakness and noted to have melanotic stools and CT scan findings suggestive of colitis. Patient also noted to be profoundly anemic with a hemoglobin of 6.6 this morning. Status post endoscopy showing hiatal hernia and gastric ulcers which were not acutely bleeding.  REVIEW OF SYSTEMS:    Review of Systems  Constitutional: Negative for chills and fever.  HENT: Negative for congestion and tinnitus.   Eyes: Negative for blurred vision and double vision.  Respiratory: Positive for shortness of breath. Negative for cough and wheezing.   Cardiovascular: Negative for chest pain, orthopnea and PND.  Gastrointestinal: Positive for melena. Negative for abdominal pain, diarrhea, nausea and vomiting.  Genitourinary: Negative for dysuria and hematuria.  Neurological: Positive for weakness. Negative for dizziness, sensory change and focal weakness.  All other systems reviewed and are negative.   Nutrition: Clear Liquids Tolerating Diet: Yes Tolerating PT: Await Eval.    DRUG ALLERGIES:   Allergies  Allergen Reactions  . Morphine Sulfate Other (See Comments)    Reaction:  Hallucinations     VITALS:  Blood pressure (!) 117/52, pulse 64, temperature 98.5 F (36.9 C), temperature source Oral, resp. rate 18, height 5\' 5"  (1.651 m), weight 54.4 kg (120 lb), SpO2 97 %.  PHYSICAL EXAMINATION:   Physical Exam  GENERAL:  83 y.o.-year-old pale appearing patient lying in bed in no acute distress.  EYES: Pupils equal, round, reactive to light and accommodation. No scleral icterus. Extraocular muscles intact.  HEENT: Head atraumatic, normocephalic. Oropharynx and nasopharynx clear.  NECK:  Supple, no jugular venous distention. No thyroid enlargement, no tenderness.  LUNGS: Normal breath sounds bilaterally, no wheezing,  rales, rhonchi. No use of accessory muscles of respiration.  CARDIOVASCULAR: S1, S2 normal. No murmurs, rubs, or gallops.  ABDOMEN: Soft, nontender, nondistended. Bowel sounds present. No organomegaly or mass.  EXTREMITIES: No cyanosis, clubbing or edema b/l.    NEUROLOGIC: Cranial nerves II through XII are intact. No focal Motor or sensory deficits b/l.   PSYCHIATRIC: The patient is alert and oriented x 3.  SKIN: No obvious rash, lesion, or ulcer.    LABORATORY PANEL:   CBC  Recent Labs Lab 11/06/16 0444  WBC 4.2  HGB 6.6*  HCT 19.4*  PLT 183   ------------------------------------------------------------------------------------------------------------------  Chemistries   Recent Labs Lab 10/31/16 1322 11/05/16 1825 11/06/16 0444  NA 137 138 139  K 4.3 3.9 3.9  CL 109 109 114*  CO2 20 20* 22  GLUCOSE 109* 95 94  BUN 36* 24* 21*  CREATININE 1.75* 1.61* 1.57*  CALCIUM 9.1 8.9 8.1*  MG 1.5  --   --   AST 10 18  --   ALT 7 11*  --   ALKPHOS 62 66  --   BILITOT 0.3 0.3  --    ------------------------------------------------------------------------------------------------------------------  Cardiac Enzymes No results for input(s): TROPONINI in the last 168 hours. ------------------------------------------------------------------------------------------------------------------  RADIOLOGY:  Ct Abdomen Pelvis Wo Contrast  Result Date: 11/05/2016 CLINICAL DATA:  83 y/o F; 3-4 weeks of diffuse cramping abdominal discomfort and daily diarrhea. EXAM: CT ABDOMEN AND PELVIS WITHOUT CONTRAST TECHNIQUE: Multidetector CT imaging of the abdomen and pelvis was performed following the standard protocol without IV contrast. COMPARISON:  04/17/2014 CT abdomen and pelvis. FINDINGS: Lower chest: Stable moderate hiatal hernia. Hepatobiliary: Cyst in left lobe of  liver has increased in size measuring 46 mm. No new focal liver lesion. Normal gallbladder. No intra or extrahepatic biliary  ductal dilatation. Pancreas: Unremarkable. No pancreatic ductal dilatation or surrounding inflammatory changes. Spleen: Normal in size without focal abnormality. Adrenals/Urinary Tract: Punctate calcification in right kidney hilum is likely vascular. Normal adrenal glands. No hydronephrosis. Normal bladder. Stomach/Bowel: No obstructive changes of bowel. Mild fat stranding surrounding the ascending colon and cecum and thickening of the right pericolic fascia. Extensive sigmoid diverticulosis. Vascular/Lymphatic: Suprarenal abdominal aortic ectasia measuring 3.0 cm is stable. Severe extensive calcific atherosclerosis of the abdominal aorta, iliofemoral arteries, and major branches. Reproductive: Uterus and bilateral adnexa are unremarkable. Other: No abdominal wall hernia or abnormality. No abdominopelvic ascites. Musculoskeletal: Moderate lumbar levocurvature with apex at L2. Stable moderate anterior compression deformity of L1 vertebral body. Stable advanced lumbar degenerative changes. Stable grade 1 L5-S1 anterolisthesis and chronic right-sided L5 pars defect. IMPRESSION: 1. Mild fat stranding surrounding ascending colon and cecum with thickening of right pericolic fascia probably represents a mild underlying ascending colitis. 2. Stable moderate hiatal hernia. 3. Cyst in left lobe of liver has increased in size to 46 mm. 4. Sigmoid diverticulosis without appreciable acute diverticulitis. 5. Suprarenal abdominal aortic ectasia to 3.0 cm. Recommend followup by ultrasound in 3 years. This recommendation follows ACR consensus guidelines: White Paper of the ACR Incidental Findings Committee II on Vascular Findings. J Am Coll Radiol 2013; 10:789-794. 6. Stable advanced lumbar degenerative changes, levocurvature, L1 compression deformity, and grade 1 L5-S1 anterolisthesis. Electronically Signed   By: Kristine Garbe M.D.   On: 11/05/2016 22:54     ASSESSMENT AND PLAN:   83 year old female with past  medical history of coronary artery disease status post recent stent placement, COPD, GERD, hypertension, hyperlipidemia, macular degeneration, history of aortic aneurysm who presented to the hospital due to profound weakness and noted to be anemic with heme positive stools.   1. GI bleed-suspected to be an upper GI bleed given patient's melanotic stools. Continue to hold Plavix for now. -Hemoglobin down to 6.6 and will get transfused 2 units today. Status post upper GI endoscopy today showing large hiatal hernia with gastric ulcers but no evidence of acute bleeding. -Continue PPI, external clear liquid diet and watch for any bleeding.  2. Acute blood loss anemia-secondary to the GI bleed. Patient to get transfused 2 units of packed red blood cells and will follow serial hemoglobin.  3. Colitis-patient noted to have colitis on a CT scan on admission. This is likely ischemic colitis and chronic as this was noticed in the CAT scan in 2015 2. We'll DC IV Zosyn for now. Follow clinically. Patient not having any bloody diarrhea presently.  4. History of coronary artery disease status post recent stent placement-currently not having any chest pain. -Continue aspirin, beta blocker, statin. Plavix on hold and likely resume tomorrow as high risk for in-stent restenosis given her recent stent placement. Discussed with cardiology.  5. Hyperlipidemia-continue atorvastatin.  6. Essential hypertension-continue metoprolol, lisinopril.  7. Anxiety-continue Paxil.   All the records are reviewed and case discussed with Care Management/Social Worker. Management plans discussed with the patient, family and they are in agreement.  CODE STATUS: Full code  DVT Prophylaxis: Ted's & SCD's.   TOTAL TIME TAKING CARE OF THIS PATIENT: 30 minutes.   POSSIBLE D/C IN 1-2 DAYS, DEPENDING ON CLINICAL CONDITION.   Henreitta Leber M.D on 11/06/2016 at 3:09 PM  Between 7am to 6pm - Pager - [phone]  After 6pm go  to [email or web address] - Proofreader  [facility]  Office  [phone]  CC: Primary care physician; Crecencio Mc, MD

## 2016-11-06 NOTE — Anesthesia Post-op Follow-up Note (Cosign Needed)
Anesthesia QCDR form completed.        

## 2016-11-06 NOTE — Anesthesia Procedure Notes (Signed)
Date/Time: 11/06/2016 12:29 PM Performed by: Doreen Salvage Pre-anesthesia Checklist: Patient identified, Emergency Drugs available, Suction available and Patient being monitored Patient Re-evaluated:Patient Re-evaluated prior to induction Oxygen Delivery Method: Nasal cannula Induction Type: IV induction Dental Injury: Teeth and Oropharynx as per pre-operative assessment  Comments: Nasal cannula with etCO2 monitoring

## 2016-11-06 NOTE — Anesthesia Postprocedure Evaluation (Signed)
Anesthesia Post Note  Patient: Anna Prince  Procedure(s) Performed: Procedure(s) (LRB): ESOPHAGOGASTRODUODENOSCOPY (EGD) WITH PROPOFOL (N/A)  Patient location during evaluation: PACU Anesthesia Type: General Level of consciousness: awake Pain management: pain level controlled Vital Signs Assessment: post-procedure vital signs reviewed and stable Respiratory status: nonlabored ventilation Cardiovascular status: stable Anesthetic complications: no     Last Vitals:  Vitals:   11/06/16 1244 11/06/16 1316  BP: (!) 107/58 122/62  Pulse: 66   Resp: 16   Temp: 36.6 C 36.7 C    Last Pain:  Vitals:   11/06/16 1316  TempSrc: Tympanic  PainSc:                  VAN STAVEREN,Nira Visscher

## 2016-11-06 NOTE — Progress Notes (Signed)
Pharmacy Antibiotic Note  LAEKYN RAYOS is a 83 y.o. female admitted on 11/05/2016 with intra-abdominal infx.  Pharmacy has been consulted for zosyn dosing.  Plan: Zosyn 3.375g IV q8h (4 hour infusion).  Height: 5\' 5"  (165.1 cm) Weight: 120 lb (54.4 kg) IBW/kg (Calculated) : 57  Temp (24hrs), Avg:98.8 F (37.1 C), Min:98.6 F (37 C), Max:98.9 F (37.2 C)   Recent Labs Lab 10/31/16 1322 11/05/16 1825 11/05/16 1932  WBC  --  5.4  --   CREATININE 1.75* 1.61*  --   LATICACIDVEN  --   --  1.1    Estimated Creatinine Clearance: 23.1 mL/min (A) (by C-G formula based on SCr of 1.61 mg/dL (H)).    Allergies  Allergen Reactions  . Morphine Sulfate Other (See Comments)    Reaction:  Hallucinations      Thank you for allowing pharmacy to be a part of this patient's care.  Tobie Lords 11/06/2016 1:44 AM

## 2016-11-07 ENCOUNTER — Ambulatory Visit: Payer: Medicare Other

## 2016-11-07 ENCOUNTER — Ambulatory Visit: Payer: Medicare Other | Admitting: Internal Medicine

## 2016-11-07 ENCOUNTER — Encounter: Payer: Self-pay | Admitting: Gastroenterology

## 2016-11-07 LAB — CBC
HEMATOCRIT: 27.7 % — AB (ref 35.0–47.0)
HEMOGLOBIN: 9.5 g/dL — AB (ref 12.0–16.0)
MCH: 30.5 pg (ref 26.0–34.0)
MCHC: 34.4 g/dL (ref 32.0–36.0)
MCV: 88.8 fL (ref 80.0–100.0)
Platelets: 173 10*3/uL (ref 150–440)
RBC: 3.12 MIL/uL — ABNORMAL LOW (ref 3.80–5.20)
RDW: 15.2 % — AB (ref 11.5–14.5)
WBC: 4 10*3/uL (ref 3.6–11.0)

## 2016-11-07 LAB — TYPE AND SCREEN
ABO/RH(D): O POS
Antibody Screen: NEGATIVE
UNIT DIVISION: 0
Unit division: 0

## 2016-11-07 LAB — BPAM RBC
BLOOD PRODUCT EXPIRATION DATE: 201807292359
Blood Product Expiration Date: 201807262359
ISSUE DATE / TIME: 201807171458
ISSUE DATE / TIME: 201807170936
UNIT TYPE AND RH: 5100
Unit Type and Rh: 5100

## 2016-11-07 MED ORDER — ENSURE ENLIVE PO LIQD
237.0000 mL | Freq: Two times a day (BID) | ORAL | Status: DC
  Administered 2016-11-07: 237 mL via ORAL

## 2016-11-07 MED ORDER — CLOPIDOGREL BISULFATE 75 MG PO TABS
75.0000 mg | ORAL_TABLET | Freq: Every day | ORAL | Status: DC
  Administered 2016-11-07 – 2016-11-08 (×2): 75 mg via ORAL
  Filled 2016-11-07 (×2): qty 1

## 2016-11-07 NOTE — Evaluation (Signed)
Physical Therapy Evaluation Patient Details Name: Anna Prince MRN: [id number] DOB: 1931/06/12 Today's Date: 11/07/2016   History of Present Illness  Pt is an 83 y.o.femalewho presents with 2 weeks of diarrhea, including some melanotic stool. She has had worsening abdominal pain for the last couple of days. Here in the ED we found colitis on imaging, and her hemoglobin noted to be 6.6 g/dL. Hospitalists were called for admission.  Assessment includes: colitis with pt s/p upper GI endoscopy, anemia, HTN, CAD, HLD, anxiety, and GERD.    Clinical Impression  Pt reports feeling much better this date.  Pt able to perform all bed mobility and transfer tasks independently without extra time or effort required.  Pt steady in standing without UE support including with perturbations, with eyes closed, and with head turns.  Pt able to amb 200' with RW and SBA with good stability and with vital signs WNL.  No c/o of any adverse symptoms during session.  Pt reports feeling that she has returned to baseline and requests no further PT services at this time.  Will complete PT orders but will re-assess pending a change in status upon receipt of new PT orders.     Follow Up Recommendations No PT follow up    Equipment Recommendations  None recommended by PT    Recommendations for Other Services       Precautions / Restrictions Precautions Precautions: Fall Restrictions Weight Bearing Restrictions: No      Mobility  Bed Mobility Overal bed mobility: Independent                Transfers Overall transfer level: Independent                  Ambulation/Gait Ambulation/Gait assistance: Supervision Ambulation Distance (Feet): 200 Feet Assistive device: Rolling walker (2 wheeled) Gait Pattern/deviations: Step-through pattern   Gait velocity interpretation: Below normal speed for age/gender General Gait Details: Pt steady with gait without LOB with HR and SpO2 WNL during  session  Stairs            Wheelchair Mobility    Modified Rankin (Stroke Patients Only)       Balance Overall balance assessment: Needs assistance Sitting-balance support: No upper extremity supported;Feet supported Sitting balance-Leahy Scale: Normal     Standing balance support: No upper extremity supported Standing balance-Leahy Scale: Good Standing balance comment: Pt steady in standing without UE support including with perturbations, with eyes closed, and with head turns                             Pertinent Vitals/Pain Pain Assessment: No/denies pain    Home Living Family/patient expects to be discharged to:: Private residence Living Arrangements: Children Available Help at Discharge: Family;Available 24 hours/day Type of Home: House Home Access: Stairs to enter Entrance Stairs-Rails: None Entrance Stairs-Number of Steps: 1 Home Layout: One level Home Equipment: Walker - 2 wheels;Walker - 4 wheels;Cane - single point      Prior Function Level of Independence: Independent with assistive device(s)         Comments: Mod Ind with amb with rollator in home and SPC in community with one fall in last year; Ind with ADLs     Hand Dominance   Dominant Hand: Right    Extremity/Trunk Assessment   Upper Extremity Assessment Upper Extremity Assessment: Overall WFL for tasks assessed    Lower Extremity Assessment Lower Extremity Assessment: Overall WFL for  tasks assessed       Communication   Communication: No difficulties  Cognition Arousal/Alertness: Awake/alert Behavior During Therapy: WFL for tasks assessed/performed Overall Cognitive Status: Within Functional Limits for tasks assessed                                        General Comments      Exercises     Assessment/Plan    PT Assessment Patent does not need any further PT services  PT Problem List         PT Treatment Interventions      PT Goals  (Current goals can be found in the Care Plan section)  Acute Rehab PT Goals Patient Stated Goal: To return home PT Goal Formulation: With patient Potential to Achieve Goals: Good    Frequency     Barriers to discharge        Co-evaluation               AM-PAC PT "6 Clicks" Daily Activity  Outcome Measure Difficulty turning over in bed (including adjusting bedclothes, sheets and blankets)?: None Difficulty moving from lying on back to sitting on the side of the bed? : None Difficulty sitting down on and standing up from a chair with arms (e.g., wheelchair, bedside commode, etc,.)?: None Help needed moving to and from a bed to chair (including a wheelchair)?: None Help needed walking in hospital room?: None Help needed climbing 3-5 steps with a railing? : None 6 Click Score: 24    End of Session Equipment Utilized During Treatment: Gait belt Activity Tolerance: Patient tolerated treatment well Patient left: in bed;with bed alarm set;with call bell/phone within reach Nurse Communication: Mobility status PT Visit Diagnosis: Muscle weakness (generalized) (M62.81)    Time: 0935-1000 PT Time Calculation (min) (ACUTE ONLY): 25 min   Charges:   PT Evaluation $PT Eval Low Complexity: 1 Procedure     PT G Codes:        DRoyetta Asal PT, DPT 11/07/16, 12:33 PM

## 2016-11-07 NOTE — Progress Notes (Signed)
[place] [facility] [place] NAME: Anna Prince    MR#:  [id number]  DATE OF BIRTH:  January 07, 1932  SUBJECTIVE:   Patient here due to profound weakness and noted to have melanotic stools and CT scan findings suggestive of colitis. Patient also noted to be profoundly anemic with a hemoglobin of 6.6. Status post endoscopy showing hiatal hernia and gastric ulcers which were not acutely bleeding. Hg. Improved post transfusion and stable.  No further bleeding overnight.   REVIEW OF SYSTEMS:    Review of Systems  Constitutional: Negative for chills and fever.  HENT: Negative for congestion and tinnitus.   Eyes: Negative for blurred vision and double vision.  Respiratory: Negative for cough, shortness of breath and wheezing.   Cardiovascular: Negative for chest pain, orthopnea and PND.  Gastrointestinal: Negative for abdominal pain, diarrhea, melena, nausea and vomiting.  Genitourinary: Negative for dysuria and hematuria.  Neurological: Negative for dizziness, sensory change, focal weakness and weakness.  All other systems reviewed and are negative.   Nutrition: soft diet Tolerating Diet: Yes Tolerating PT: Eval noted.     DRUG ALLERGIES:   Allergies  Allergen Reactions  . Morphine Sulfate Other (See Comments)    Reaction:  Hallucinations     VITALS:  Blood pressure (!) 149/72, pulse (!) 48, temperature 98.5 F (36.9 C), temperature source Oral, resp. rate 18, height 5\' 5"  (1.651 m), weight 54.4 kg (120 lb), SpO2 100 %.  PHYSICAL EXAMINATION:   Physical Exam  GENERAL:  83 y.o.-year-old pale appearing patient lying in bed in no acute distress.  EYES: Pupils equal, round, reactive to light and accommodation. No scleral icterus. Extraocular muscles intact.  HEENT: Head atraumatic, normocephalic. Oropharynx and nasopharynx clear.  NECK:  Supple, no jugular venous distention. No thyroid enlargement, no tenderness.  LUNGS: Normal breath sounds  bilaterally, no wheezing, rales, rhonchi. No use of accessory muscles of respiration.  CARDIOVASCULAR: S1, S2 normal. No murmurs, rubs, or gallops.  ABDOMEN: Soft, nontender, nondistended. Bowel sounds present. No organomegaly or mass.  EXTREMITIES: No cyanosis, clubbing or edema b/l.    NEUROLOGIC: Cranial nerves II through XII are intact. No focal Motor or sensory deficits b/l.   PSYCHIATRIC: The patient is alert and oriented x 3.  SKIN: No obvious rash, lesion, or ulcer.    LABORATORY PANEL:   CBC  Recent Labs Lab 11/07/16 0414  WBC 4.0  HGB 9.5*  HCT 27.7*  PLT 173   ------------------------------------------------------------------------------------------------------------------  Chemistries   Recent Labs Lab 11/05/16 1825 11/06/16 0444  NA 138 139  K 3.9 3.9  CL 109 114*  CO2 20* 22  GLUCOSE 95 94  BUN 24* 21*  CREATININE 1.61* 1.57*  CALCIUM 8.9 8.1*  AST 18  --   ALT 11*  --   ALKPHOS 66  --   BILITOT 0.3  --    ------------------------------------------------------------------------------------------------------------------  Cardiac Enzymes No results for input(s): TROPONINI in the last 168 hours. ------------------------------------------------------------------------------------------------------------------  RADIOLOGY:  Ct Abdomen Pelvis Wo Contrast  Result Date: 11/05/2016 CLINICAL DATA:  83 y/o F; 3-4 weeks of diffuse cramping abdominal discomfort and daily diarrhea. EXAM: CT ABDOMEN AND PELVIS WITHOUT CONTRAST TECHNIQUE: Multidetector CT imaging of the abdomen and pelvis was performed following the standard protocol without IV contrast. COMPARISON:  04/17/2014 CT abdomen and pelvis. FINDINGS: Lower chest: Stable moderate hiatal hernia. Hepatobiliary: Cyst in left lobe of liver has increased in size measuring 46 mm. No new focal liver lesion. Normal gallbladder. No intra  or extrahepatic biliary ductal dilatation. Pancreas: Unremarkable. No pancreatic  ductal dilatation or surrounding inflammatory changes. Spleen: Normal in size without focal abnormality. Adrenals/Urinary Tract: Punctate calcification in right kidney hilum is likely vascular. Normal adrenal glands. No hydronephrosis. Normal bladder. Stomach/Bowel: No obstructive changes of bowel. Mild fat stranding surrounding the ascending colon and cecum and thickening of the right pericolic fascia. Extensive sigmoid diverticulosis. Vascular/Lymphatic: Suprarenal abdominal aortic ectasia measuring 3.0 cm is stable. Severe extensive calcific atherosclerosis of the abdominal aorta, iliofemoral arteries, and major branches. Reproductive: Uterus and bilateral adnexa are unremarkable. Other: No abdominal wall hernia or abnormality. No abdominopelvic ascites. Musculoskeletal: Moderate lumbar levocurvature with apex at L2. Stable moderate anterior compression deformity of L1 vertebral body. Stable advanced lumbar degenerative changes. Stable grade 1 L5-S1 anterolisthesis and chronic right-sided L5 pars defect. IMPRESSION: 1. Mild fat stranding surrounding ascending colon and cecum with thickening of right pericolic fascia probably represents a mild underlying ascending colitis. 2. Stable moderate hiatal hernia. 3. Cyst in left lobe of liver has increased in size to 46 mm. 4. Sigmoid diverticulosis without appreciable acute diverticulitis. 5. Suprarenal abdominal aortic ectasia to 3.0 cm. Recommend followup by ultrasound in 3 years. This recommendation follows ACR consensus guidelines: White Paper of the ACR Incidental Findings Committee II on Vascular Findings. J Am Coll Radiol 2013; 10:789-794. 6. Stable advanced lumbar degenerative changes, levocurvature, L1 compression deformity, and grade 1 L5-S1 anterolisthesis. Electronically Signed   By: Kristine Garbe M.D.   On: 11/05/2016 22:54     ASSESSMENT AND PLAN:   83 year old female with past medical history of coronary artery disease status post recent  stent placement, COPD, GERD, hypertension, hyperlipidemia, macular degeneration, history of aortic aneurysm who presented to the hospital due to profound weakness and noted to be anemic with heme positive stools.  1. GI bleed-suspected to be an upper GI bleed given patient's melanotic stools. - Hg. Improved post transfusion and will cont. To monitor.  No bleeding overnight.  - Status post upper GI endoscopy yesterday showing large hiatal hernia with gastric ulcers but no evidence of acute bleeding. -Continue PPI, and will advance diet and watch for bleeding. Resume Plavix today.   2. Acute blood loss anemia-secondary to the GI bleed. Patient to get transfused 2 units of packed red blood cells and will follow serial hemoglobin.  3. Colitis-patient noted to have colitis on a CT scan on admission. This is likely ischemic colitis and chronic as this was noticed in the CAT scan in 2015.  Off IV Zosyn and clinically doing well. No bloody diarrhea noted.   4. History of coronary artery disease status post recent stent placement-currently not having any chest pain. -Continue aspirin, beta blocker, statin. Will resume Plavix today and watch for any bleeding.   5. Hyperlipidemia-continue atorvastatin.  6. Essential hypertension-continue metoprolol, lisinopril.  7. Anxiety-continue Paxil.   All the records are reviewed and case discussed with Care Management/Social Worker. Management plans discussed with the patient, family and they are in agreement.  CODE STATUS: Full code  DVT Prophylaxis: Ted's & SCD's.   TOTAL TIME TAKING CARE OF THIS PATIENT: 25 minutes.   POSSIBLE D/C IN 1-2 DAYS, DEPENDING ON CLINICAL CONDITION.   Henreitta Leber M.D on 11/07/2016 at 1:38 PM  Between 7am to 6pm - Pager - [phone]  After 6pm go to [email or web address] - Proofreader  [facility]  Office  [phone]  CC: Primary care physician; Crecencio Mc, MD

## 2016-11-08 ENCOUNTER — Telehealth: Payer: Self-pay | Admitting: *Deleted

## 2016-11-08 LAB — HEMOGLOBIN: HEMOGLOBIN: 10.2 g/dL — AB (ref 12.0–16.0)

## 2016-11-08 MED ORDER — PANTOPRAZOLE SODIUM 40 MG PO TBEC: 40.0000 mg | DELAYED_RELEASE_TABLET | Freq: Two times a day (BID) | ORAL | 2 refills | Status: DC

## 2016-11-08 NOTE — Progress Notes (Signed)
Patient discharged to home. IV site removed. Concerns addressed. Prescription provided

## 2016-11-08 NOTE — Telephone Encounter (Signed)
Pt will discharge from [facility] on 07/19. Pt will need a HFU appt .

## 2016-11-08 NOTE — Care Management Important Message (Signed)
Important Message  Patient Details  Name: Anna Prince MRN: [id number] Date of Birth: 04-Jan-1932   Medicare Important Message Given:  Yes    Beverly Sessions, RN 11/08/2016, 10:29 AM

## 2016-11-09 LAB — STOOL CULTURE

## 2016-11-09 NOTE — Discharge Summary (Signed)
[facility] NAME: Anna Prince    MR#:  [id number]  DATE OF BIRTH:  04/09/1932  DATE OF ADMISSION:  11/05/2016 ADMITTING PHYSICIAN: Lance Coon, MD  DATE OF DISCHARGE: 11/08/2016 10:52 AM  PRIMARY CARE PHYSICIAN: Crecencio Mc, MD    ADMISSION DIAGNOSIS:  Pain [R52] Left lower quadrant pain [R10.32] Anemia, unspecified type [D64.9] Diarrhea, unspecified type [R19.7]  DISCHARGE DIAGNOSIS:  Principal Problem:   Colitis Active Problems:   Hyperlipidemia   Hypertension   Anemia   Anxiety   Coronary artery disease   GERD (gastroesophageal reflux disease)   Diarrhea   Acute gastric ulcer without hemorrhage or perforation   SECONDARY DIAGNOSIS:   Past Medical History:  Diagnosis Date  . AAA (abdominal aortic aneurysm) ([place])   . Anemia 2012   of acute blood loss, resolved  . Anxiety   . Aortic aneurysm ([place])   . Arthritis   . Barrett's esophagus 2007  . Bursitis 2012   left hip, improved with periodic steroid injection [facility] [place])  . COPD (chronic obstructive pulmonary disease) ([place])   . Coronary artery disease   . Depression   . GERD (gastroesophageal reflux disease)    with Barretts Esophagus  . Hyperlipidemia   . Hypertension   . Lumbago   . Macular degeneration   . Osteoporosis   . Sigmoid diverticulosis    by colonoscopy  . Stroke [facility])     HOSPITAL COURSE:   83 year old female with past medical history of coronary artery disease status post recent stent placement, COPD, GERD, hypertension, hyperlipidemia, macular degeneration, history of aortic aneurysm who presented to the hospital due to profound weakness and noted to be anemic with heme positive stools.  1. GI bleed-suspected to be an upper GI bleed given patient's melanotic stools. -Patient was seen by gastroenterology and underwent an upper GI endoscopy which showed a large hiatal hernia with gastric ulcers but no evidence of acute  bleeding. Patient's Plavix was held initially but now has been resumed without any further evidence of bleeding. -Patient was transfused a total of 2 units of packed red blood cells and hemoglobin has improved post transfusion and remained stable. -Patient will be discharged on PPI twice a day and follow-up with her primary care physician as an outpatient.  2. Acute blood loss anemia-secondary to the GI bleed. Patient got transfused a total of 2 units of packed red blood cells and hemoglobin has improved post transfusion remained stable.  3. Colitis-patient noted to have colitis on a CT scan on admission. This was likely ischemic colitis and chronic as this was noticed in the CAT scan in 2015.   initially on admission patient was started on IV Zosyn but this was discontinued. Patient has had no bloody diarrhea no further abdominal pain and is clinically asymptomatic and tolerating a regular diet well now.  4. History of coronary artery disease status post recent stent placement-currently not having any chest pain. -Initially patient's Plavix was held given her GI bleed but this has been resumed now due to a recent cardiac stent placement. She will continue aspirin and beta blocker and statin.  5. Hyperlipidemia- she will continue atorvastatin.  6. Essential hypertension- she will continue metoprolol, lisinopril.  7. Anxiety- she will continue Paxil.  DISCHARGE CONDITIONS:   Stable.   CONSULTS OBTAINED:  Treatment Team:  Lucilla Lame, MD  DRUG ALLERGIES:   Allergies  Allergen Reactions  . Morphine Sulfate Other (See  Comments)    Reaction:  Hallucinations     DISCHARGE MEDICATIONS:   Allergies as of 11/08/2016      Reactions   Morphine Sulfate Other (See Comments)   Reaction:  Hallucinations       Medication List    TAKE these medications   albuterol 108 (90 Base) MCG/ACT inhaler Commonly known as:  PROVENTIL HFA;VENTOLIN HFA Inhale 2 puffs into the lungs every 6  (six) hours as needed for wheezing or shortness of breath.   aspirin 81 MG EC tablet Take 1 tablet (81 mg total) by mouth daily.   atorvastatin 40 MG tablet Commonly known as:  LIPITOR Take 1 tablet (40 mg total) by mouth daily at 6 PM.   budesonide-formoterol 80-4.5 MCG/ACT inhaler Commonly known as:  SYMBICORT Inhale 2 puffs into the lungs 2 (two) times daily.   clopidogrel 75 MG tablet Commonly known as:  PLAVIX Take 1 tablet (75 mg total) by mouth daily.   diazepam 5 MG tablet Commonly known as:  VALIUM Take 0.5 tablets (2.5 mg total) by mouth 2 (two) times daily as needed.   ferrous sulfate 325 (65 FE) MG tablet Take 1 tablet (325 mg total) by mouth daily with breakfast.   HYDROcodone-acetaminophen 5-325 MG tablet Commonly known as:  NORCO/VICODIN Take 1 tablet by mouth every 6 (six) hours as needed for moderate pain. Maximum 2 daily.   lisinopril 20 MG tablet Commonly known as:  PRINIVIL,ZESTRIL TAKE 1 TABLET BY MOUTH EVERY DAY   megestrol 40 MG tablet Commonly known as:  MEGACE Take 1 tablet (40 mg total) by mouth daily.   metoprolol tartrate 25 MG tablet Commonly known as:  LOPRESSOR Take 0.5 tablets (12.5 mg total) by mouth 2 (two) times daily.   mirtazapine 45 MG tablet Commonly known as:  REMERON Take 1 tablet (45 mg total) by mouth at bedtime.   nitroGLYCERIN 0.4 MG SL tablet Commonly known as:  NITROSTAT Place 1 tablet (0.4 mg total) under the tongue every 5 (five) minutes x 3 doses as needed for chest pain.   pantoprazole 40 MG tablet Commonly known as:  PROTONIX Take 1 tablet (40 mg total) by mouth 2 (two) times daily. What changed:  when to take this   PARoxetine 30 MG tablet Commonly known as:  PAXIL Take 1 tablet (30 mg total) by mouth daily. To be refilled by PCP         DISCHARGE INSTRUCTIONS:   DIET:  Cardiac diet  DISCHARGE CONDITION:  Stable  ACTIVITY:  Activity as tolerated  OXYGEN:  Home Oxygen: No.   Oxygen  Delivery: room air  DISCHARGE LOCATION:  home   If you experience worsening of your admission symptoms, develop shortness of breath, life threatening emergency, suicidal or homicidal thoughts you must seek medical attention immediately by calling 911 or calling your MD immediately  if symptoms less severe.  You Must read complete instructions/literature along with all the possible adverse reactions/side effects for all the Medicines you take and that have been prescribed to you. Take any new Medicines after you have completely understood and accpet all the possible adverse reactions/side effects.   Please note  You were cared for by a hospitalist during your hospital stay. If you have any questions about your discharge medications or the care you received while you were in the hospital after you are discharged, you can call the unit and asked to speak with the hospitalist on call if the hospitalist that took care of you  is not available. Once you are discharged, your primary care physician will handle any further medical issues. Please note that NO REFILLS for any discharge medications will be authorized once you are discharged, as it is imperative that you return to your primary care physician (or establish a relationship with a primary care physician if you do not have one) for your aftercare needs so that they can reassess your need for medications and monitor your lab values.     Today   Hemoglobin stable. No acute bleeding overnight. Tolerating by mouth well. Wants to go home.  VITAL SIGNS:  Blood pressure (!) 137/56, pulse 72, temperature 98.4 F (36.9 C), temperature source Oral, resp. rate 18, height 5\' 5"  (1.651 m), weight 54.4 kg (120 lb), SpO2 96 %.  I/O:  No intake or output data in the 24 hours ending 11/09/16 1617  PHYSICAL EXAMINATION:  GENERAL:  83 y.o.-year-old patient lying in the bed with no acute distress.  EYES: Pupils equal, round, reactive to light and  accommodation. No scleral icterus. Extraocular muscles intact.  HEENT: Head atraumatic, normocephalic. Oropharynx and nasopharynx clear.  NECK:  Supple, no jugular venous distention. No thyroid enlargement, no tenderness.  LUNGS: Normal breath sounds bilaterally, no wheezing, rales,rhonchi. No use of accessory muscles of respiration.  CARDIOVASCULAR: S1, S2 normal. No murmurs, rubs, or gallops.  ABDOMEN: Soft, non-tender, non-distended. Bowel sounds present. No organomegaly or mass.  EXTREMITIES: No pedal edema, cyanosis, or clubbing.  NEUROLOGIC: Cranial nerves II through XII are intact. No focal motor or sensory defecits b/l.  PSYCHIATRIC: The patient is alert and oriented x 3. Good affect.  SKIN: No obvious rash, lesion, or ulcer.   DATA REVIEW:   CBC  Recent Labs Lab 11/07/16 0414 11/08/16 0337  WBC 4.0  --   HGB 9.5* 10.2*  HCT 27.7*  --   PLT 173  --     Chemistries   Recent Labs Lab 11/05/16 1825 11/06/16 0444  NA 138 139  K 3.9 3.9  CL 109 114*  CO2 20* 22  GLUCOSE 95 94  BUN 24* 21*  CREATININE 1.61* 1.57*  CALCIUM 8.9 8.1*  AST 18  --   ALT 11*  --   ALKPHOS 66  --   BILITOT 0.3  --     Cardiac Enzymes No results for input(s): TROPONINI in the last 168 hours.  Microbiology Results  Results for orders placed or performed in visit on 10/31/16  Clostridium Difficile by PCR     Status: None   Collection Time: 11/05/16 11:55 AM  Result Value Ref Range Status   Toxigenic C Difficile by pcr Not Detected Not Detected Final    Comment: This test is for use only with liquid or soft stools; performance characteristics of other clinical specimen types have not been established.   This assay was performed by Cepheid GeneXpert(R) PCR. The performance characteristics of this assay have been determined by [employer]. Performance characteristics refer to the analytical performance of the test.   Stool culture     Status: None   Collection Time:  11/05/16 11:55 AM  Result Value Ref Range Status   Organism ID, Bacteria NO SALMONELLA OR SHIGELLA ISOLATED  Final    Comment: NO ENTERIC CAMPYLOBACTER ISOLATED NO ESCHERICHIA COLI 0157 ISOLATED     RADIOLOGY:  No results found.    Management plans discussed with the patient, family and they are in agreement.  CODE STATUS:  Code Status History    Date  Active Date Inactive Code Status Order ID Comments User Context   11/06/2016  1:08 AM 11/08/2016  1:52 PM Full Code [id number]  Lance Coon, MD Inpatient    TOTAL TIME TAKING CARE OF THIS PATIENT: 40 minutes.    Henreitta Leber M.D on 11/09/2016 at 4:17 PM  Between 7am to 6pm - Pager - [phone]  After 6pm go to [email or web address] - Proofreader  [facility]  Office  [phone]  CC: Primary care physician; Crecencio Mc, MD

## 2016-11-09 NOTE — Telephone Encounter (Signed)
Transition Care Management Follow-up Telephone Call"  How have you been since you were released from the hospital? Daughter  Stated she is better that just weak, and just needs to get her strength back'."   Do you understand why you were in the hospital? Yes   Do you understand the discharge instrcutions? Yes  Items Reviewed:  Medications reviewed: Yes, Discontinued omeprazole started Protonix.  Allergies reviewed: Yes  Dietary changes reviewed: Yes  Referrals reviewed: yes   Functional Questionnaire:   Activities of Daily Living (ADLs):   She states they are independent in the following: All adls. States they require assistance with the following: No assist needed at  this time.  Any transportation issues/concerns?: No   Any patient concerns? No   Confirmed importance and date/time of follow-up visits scheduled: Yes 11/19/16 @ 11:30   Confirmed with patient if condition begins to worsen call PCP or go to the ER.  Patient was given the Call-a-Nurse line [phone]: Yes

## 2016-11-19 ENCOUNTER — Ambulatory Visit: Payer: Medicare Other | Admitting: Internal Medicine

## 2016-11-19 NOTE — Telephone Encounter (Signed)
Pt's daughter has the stomach virus and can not bring her mother for appt today. She needs to reschedule appt. Please call her later this week.  Thanks

## 2016-11-21 DIAGNOSIS — J9611 Chronic respiratory failure with hypoxia: Secondary | ICD-10-CM | POA: Diagnosis not present

## 2016-11-21 DIAGNOSIS — J449 Chronic obstructive pulmonary disease, unspecified: Secondary | ICD-10-CM | POA: Diagnosis not present

## 2016-11-22 NOTE — Telephone Encounter (Signed)
Patient daughter call back for appointment.

## 2016-12-03 ENCOUNTER — Telehealth: Payer: Self-pay

## 2016-12-03 NOTE — Telephone Encounter (Signed)
PA for Hydrocodone/acetaminophen completed on cover my meds.

## 2016-12-05 NOTE — Telephone Encounter (Addendum)
Received a fax from [employer] stating that this medication does not require a PA because it is on the pt's covered medication list.  Reference #: [id number]

## 2016-12-17 ENCOUNTER — Ambulatory Visit: Payer: Medicare Other

## 2016-12-22 DIAGNOSIS — J449 Chronic obstructive pulmonary disease, unspecified: Secondary | ICD-10-CM | POA: Diagnosis not present

## 2016-12-22 DIAGNOSIS — J9611 Chronic respiratory failure with hypoxia: Secondary | ICD-10-CM | POA: Diagnosis not present

## 2017-01-11 ENCOUNTER — Other Ambulatory Visit: Payer: Self-pay | Admitting: Internal Medicine

## 2017-01-11 ENCOUNTER — Other Ambulatory Visit: Payer: Self-pay

## 2017-01-14 ENCOUNTER — Other Ambulatory Visit: Payer: Self-pay | Admitting: *Deleted

## 2017-01-14 NOTE — Telephone Encounter (Signed)
Refill request for Iron 325 mg tablet. Please advise if ok to refill.

## 2017-01-15 ENCOUNTER — Other Ambulatory Visit: Payer: Self-pay

## 2017-01-15 NOTE — Telephone Encounter (Signed)
Denied per last message

## 2017-01-15 NOTE — Telephone Encounter (Signed)
Refilled: 10/08/2016 Last OV: 09/06/2016 Next OV: not scheduled

## 2017-01-21 DIAGNOSIS — J9611 Chronic respiratory failure with hypoxia: Secondary | ICD-10-CM | POA: Diagnosis not present

## 2017-01-21 DIAGNOSIS — J449 Chronic obstructive pulmonary disease, unspecified: Secondary | ICD-10-CM | POA: Diagnosis not present

## 2017-01-28 ENCOUNTER — Ambulatory Visit (INDEPENDENT_AMBULATORY_CARE_PROVIDER_SITE_OTHER): Payer: Medicare Other | Admitting: Internal Medicine

## 2017-01-28 ENCOUNTER — Encounter: Payer: Self-pay | Admitting: Internal Medicine

## 2017-01-28 VITALS — BP 122/72 | HR 66 | Temp 98.4°F | Resp 15 | Ht 65.0 in | Wt 117.4 lb

## 2017-01-28 DIAGNOSIS — I10 Essential (primary) hypertension: Secondary | ICD-10-CM

## 2017-01-28 DIAGNOSIS — E785 Hyperlipidemia, unspecified: Secondary | ICD-10-CM

## 2017-01-28 DIAGNOSIS — G894 Chronic pain syndrome: Secondary | ICD-10-CM | POA: Diagnosis not present

## 2017-01-28 DIAGNOSIS — F331 Major depressive disorder, recurrent, moderate: Secondary | ICD-10-CM

## 2017-01-28 DIAGNOSIS — Z23 Encounter for immunization: Secondary | ICD-10-CM | POA: Diagnosis not present

## 2017-01-28 DIAGNOSIS — D500 Iron deficiency anemia secondary to blood loss (chronic): Secondary | ICD-10-CM

## 2017-01-28 DIAGNOSIS — F419 Anxiety disorder, unspecified: Secondary | ICD-10-CM

## 2017-01-28 MED ORDER — DIAZEPAM 5 MG PO TABS: 5.0000 mg | ORAL_TABLET | Freq: Two times a day (BID) | ORAL | 3 refills | Status: DC | PRN

## 2017-01-28 NOTE — Patient Instructions (Addendum)
You can take stools softeners  Daily up  200 mg colace (docusate) every day to avoid hard stools , miralax /metamcil or citrucel   Save the dulcolax for no stools in  2 days   I have refilled your valium at the 5 mg dose to use up to two times daily    If the moodiness does not improve with valium,  We can change the paxil  To

## 2017-01-28 NOTE — Progress Notes (Signed)
Subjective:  Patient ID: Anna Prince, female    DOB: [birth year]  Age: 83 y.o. MRN: [id number]  CC: The primary encounter diagnosis was Essential hypertension. Diagnoses of Encounter for immunization, Iron deficiency anemia due to chronic blood loss, Chronic pain syndrome, Major depressive disorder, recurrent episode, moderate ([place]), Hyperlipidemia LDL goal <100, and Anxiety were also pertinent to this visit.  HPI TANIAH REINECKE presents for follow up on several issues including  Chronic back pain managed with narcotics due to history of gastric ulcer,  Multivessel CAD and COPD with ongoing tobacco abuse  Still losing weight despite taking megace with good appetite ++.  Avoids greasy food    Taking iron ,  Using dulcolax prn has a BM almost daily   Has been out of vicodin for the past month   Still  smoking 1/2 pack daily   Memory is better than in prior months.  Has been outr of "nerve pills"  ,  Moody  Irritable     Outpatient Medications Prior to Visit  Medication Sig Dispense Refill  . albuterol (PROVENTIL HFA;VENTOLIN HFA) 108 (90 Base) MCG/ACT inhaler Inhale 2 puffs into the lungs every 6 (six) hours as needed for wheezing or shortness of breath. 1 Inhaler 2  . aspirin EC 81 MG EC tablet Take 1 tablet (81 mg total) by mouth daily.    Marland Kitchen atorvastatin (LIPITOR) 40 MG tablet Take 1 tablet (40 mg total) by mouth daily at 6 PM. 90 tablet 1  . budesonide-formoterol (SYMBICORT) 80-4.5 MCG/ACT inhaler Inhale 2 puffs into the lungs 2 (two) times daily. 3 Inhaler 1  . clopidogrel (PLAVIX) 75 MG tablet Take 1 tablet (75 mg total) by mouth daily. 90 tablet 1  . ferrous sulfate 325 (65 FE) MG tablet Take 1 tablet (325 mg total) by mouth daily with breakfast. 30 tablet 3  . HYDROcodone-acetaminophen (NORCO/VICODIN) 5-325 MG tablet Take 1 tablet by mouth every 6 (six) hours as needed for moderate pain. Maximum 2 daily. 62 tablet 0  . lisinopril (PRINIVIL,ZESTRIL) 20 MG tablet TAKE 1  TABLET BY MOUTH EVERY DAY 90 tablet 2  . lisinopril (PRINIVIL,ZESTRIL) 20 MG tablet TAKE 1 TABLET BY MOUTH EVERY DAY 90 tablet 1  . megestrol (MEGACE) 40 MG tablet Take 1 tablet (40 mg total) by mouth daily. 90 tablet 0  . metoprolol tartrate (LOPRESSOR) 25 MG tablet Take 0.5 tablets (12.5 mg total) by mouth 2 (two) times daily. 60 tablet 0  . mirtazapine (REMERON) 45 MG tablet Take 1 tablet (45 mg total) by mouth at bedtime. 90 tablet 1  . nitroGLYCERIN (NITROSTAT) 0.4 MG SL tablet Place 1 tablet (0.4 mg total) under the tongue every 5 (five) minutes x 3 doses as needed for chest pain. 90 tablet 1  . pantoprazole (PROTONIX) 40 MG tablet Take 1 tablet (40 mg total) by mouth 2 (two) times daily. 60 tablet 2  . PARoxetine (PAXIL) 30 MG tablet Take 1 tablet (30 mg total) by mouth daily. To be refilled by PCP 90 tablet 1  . diazepam (VALIUM) 5 MG tablet Take 0.5 tablets (2.5 mg total) by mouth 2 (two) times daily as needed. 90 tablet 0   No facility-administered medications prior to visit.     Review of Systems;  Patient denies headache, fevers, malaise, unintentional weight loss, skin rash, eye pain, sinus congestion and sinus pain, sore throat, dysphagia,  hemoptysis , cough, dyspnea, wheezing, chest pain, palpitations, orthopnea, edema, abdominal pain, nausea, melena, diarrhea, constipation, flank pain,  dysuria, hematuria, urinary  Frequency, nocturia, numbness, tingling, seizures,  Focal weakness, Loss of consciousness,  Tremor, insomnia, depression, anxiety, and suicidal ideation.      Objective:  BP 122/72 (BP Location: Left Arm, Patient Position: Sitting, Cuff Size: Normal)   Pulse 66   Temp 98.4 F (36.9 C) (Oral)   Resp 15   Ht 5\' 5"  (1.651 m)   Wt 117 lb 6.4 oz (53.3 kg)   SpO2 99%   BMI 19.54 kg/m   BP Readings from Last 3 Encounters:  01/28/17 122/72  11/08/16 (!) 137/56  09/06/16 106/74    Wt Readings from Last 3 Encounters:  01/28/17 117 lb 6.4 oz (53.3 kg)  11/05/16  120 lb (54.4 kg)  09/06/16 122 lb 9.6 oz (55.6 kg)    General appearance: alert, cooperative and appears stated age Ears: normal TM's and external ear canals both ears Throat: lips, mucosa, and tongue normal; teeth and gums normal Neck: no adenopathy, no carotid bruit, supple, symmetrical, trachea midline and thyroid not enlarged, symmetric, no tenderness/mass/nodules Back: symmetric, no curvature. ROM normal. No CVA tenderness. Lungs: clear to auscultation bilaterally Heart: regular rate and rhythm, S1, S2 normal, no murmur, click, rub or gallop Abdomen: soft, non-tender; bowel sounds normal; no masses,  no organomegaly Pulses: 2+ and symmetric Skin: Skin color, texture, turgor normal. No rashes or lesions Lymph nodes: Cervical, supraclavicular, and axillary nodes normal.  Lab Results  Component Value Date   HGBA1C 5.6 08/29/2016    Lab Results  Component Value Date   CREATININE 1.30 (H) 01/28/2017   CREATININE 1.57 (H) 11/06/2016   CREATININE 1.61 (H) 11/05/2016    Lab Results  Component Value Date   WBC 4.5 01/28/2017   HGB 9.1 (L) 01/28/2017   HCT 27.6 (L) 01/28/2017   PLT 200.0 01/28/2017   GLUCOSE 89 01/28/2017   CHOL 150 09/19/2016   TRIG 71.0 09/19/2016   HDL 39.90 09/19/2016   LDLDIRECT 75.0 09/19/2016   LDLCALC 95 09/19/2016   ALT 10 01/28/2017   AST 11 01/28/2017   NA 136 01/28/2017   K 4.3 01/28/2017   CL 106 01/28/2017   CREATININE 1.30 (H) 01/28/2017   BUN 26 (H) 01/28/2017   CO2 23 01/28/2017   TSH 0.44 11/04/2015   INR 1.09 08/31/2016   HGBA1C 5.6 08/29/2016    Ct Abdomen Pelvis Wo Contrast  Result Date: 11/05/2016 CLINICAL DATA:  83 y/o F; 3-4 weeks of diffuse cramping abdominal discomfort and daily diarrhea. EXAM: CT ABDOMEN AND PELVIS WITHOUT CONTRAST TECHNIQUE: Multidetector CT imaging of the abdomen and pelvis was performed following the standard protocol without IV contrast. COMPARISON:  04/17/2014 CT abdomen and pelvis. FINDINGS: Lower  chest: Stable moderate hiatal hernia. Hepatobiliary: Cyst in left lobe of liver has increased in size measuring 46 mm. No new focal liver lesion. Normal gallbladder. No intra or extrahepatic biliary ductal dilatation. Pancreas: Unremarkable. No pancreatic ductal dilatation or surrounding inflammatory changes. Spleen: Normal in size without focal abnormality. Adrenals/Urinary Tract: Punctate calcification in right kidney hilum is likely vascular. Normal adrenal glands. No hydronephrosis. Normal bladder. Stomach/Bowel: No obstructive changes of bowel. Mild fat stranding surrounding the ascending colon and cecum and thickening of the right pericolic fascia. Extensive sigmoid diverticulosis. Vascular/Lymphatic: Suprarenal abdominal aortic ectasia measuring 3.0 cm is stable. Severe extensive calcific atherosclerosis of the abdominal aorta, iliofemoral arteries, and major branches. Reproductive: Uterus and bilateral adnexa are unremarkable. Other: No abdominal wall hernia or abnormality. No abdominopelvic ascites. Musculoskeletal: Moderate lumbar levocurvature with apex  at L2. Stable moderate anterior compression deformity of L1 vertebral body. Stable advanced lumbar degenerative changes. Stable grade 1 L5-S1 anterolisthesis and chronic right-sided L5 pars defect. IMPRESSION: 1. Mild fat stranding surrounding ascending colon and cecum with thickening of right pericolic fascia probably represents a mild underlying ascending colitis. 2. Stable moderate hiatal hernia. 3. Cyst in left lobe of liver has increased in size to 46 mm. 4. Sigmoid diverticulosis without appreciable acute diverticulitis. 5. Suprarenal abdominal aortic ectasia to 3.0 cm. Recommend followup by ultrasound in 3 years. This recommendation follows ACR consensus guidelines: White Paper of the ACR Incidental Findings Committee II on Vascular Findings. J Am Coll Radiol 2013; 10:789-794. 6. Stable advanced lumbar degenerative changes, levocurvature, L1  compression deformity, and grade 1 L5-S1 anterolisthesis. Electronically Signed   By: Kristine Garbe M.D.   On: 11/05/2016 22:54    Assessment & Plan:   Problem List Items Addressed This Visit    Anemia   Relevant Orders   CBC with Differential/Platelet (Completed)   Anxiety    Managed with Paxil and low dose valium, aggravated by air hunger . Refill history confirmed via [place] Controlled Substance databas, accessed by me today..      Relevant Medications   diazepam (VALIUM) 5 MG tablet   Chronic pain syndrome    She appears comfortable and more alert since she ran out of her narcotic.  advise to continue tylenol       Hyperlipidemia LDL goal <100    LDL and triglycerides are at goal on current medications. She has no side effects and liver enzymes are normal. No changes today  Lab Results  Component Value Date   CHOL 150 09/19/2016   HDL 39.90 09/19/2016   LDLCALC 95 09/19/2016   LDLDIRECT 75.0 09/19/2016   TRIG 71.0 09/19/2016   CHOLHDL 4 09/19/2016   Lab Results  Component Value Date   ALT 10 01/28/2017   AST 11 01/28/2017   ALKPHOS 71 01/28/2017   BILITOT 0.2 01/28/2017         Hypertension - Primary    Well controlled on current regimen. Renal function stable, no changes today.  Lab Results  Component Value Date   CREATININE 1.30 (H) 01/28/2017   Lab Results  Component Value Date   NA 136 01/28/2017   K 4.3 01/28/2017   CL 106 01/28/2017   CO2 23 01/28/2017         Relevant Orders   Comprehensive metabolic panel (Completed)   Major depressive disorder, recurrent episode, moderate ([place])    improved with paxil and remeron .  No changes today      Relevant Medications   diazepam (VALIUM) 5 MG tablet    Other Visit Diagnoses    Encounter for immunization       Relevant Orders   Flu vaccine HIGH DOSE PF (Completed)     A total of 25 minutes of face to face time was spent with patient more than half of which was spent in counselling about  the above mentioned conditions  and coordination of care  I have changed Ms. Mccardle's diazepam. I am also having her maintain her lisinopril, albuterol, HYDROcodone-acetaminophen, aspirin, metoprolol tartrate, ferrous sulfate, nitroGLYCERIN, atorvastatin, budesonide-formoterol, megestrol, mirtazapine, PARoxetine, clopidogrel, pantoprazole, and lisinopril.  Meds ordered this encounter  Medications  . diazepam (VALIUM) 5 MG tablet    Sig: Take 1 tablet (5 mg total) by mouth 2 (two) times daily as needed.    Dispense:  60 tablet  Refill:  3    Refill for 30 days only.    Medications Discontinued During This Encounter  Medication Reason  . diazepam (VALIUM) 5 MG tablet Reorder    Follow-up: No Follow-up on file.   Crecencio Mc, MD

## 2017-01-29 LAB — CBC WITH DIFFERENTIAL/PLATELET
BASOS ABS: 0 10*3/uL (ref 0.0–0.1)
Basophils Relative: 0.4 % (ref 0.0–3.0)
EOS PCT: 4.8 % (ref 0.0–5.0)
Eosinophils Absolute: 0.2 10*3/uL (ref 0.0–0.7)
HCT: 27.6 % — ABNORMAL LOW (ref 36.0–46.0)
HEMOGLOBIN: 9.1 g/dL — AB (ref 12.0–15.0)
LYMPHS PCT: 18.2 % (ref 12.0–46.0)
Lymphs Abs: 0.8 10*3/uL (ref 0.7–4.0)
MCHC: 32.9 g/dL (ref 30.0–36.0)
MCV: 96.3 fl (ref 78.0–100.0)
MONOS PCT: 8.3 % (ref 3.0–12.0)
Monocytes Absolute: 0.4 10*3/uL (ref 0.1–1.0)
Neutro Abs: 3.1 10*3/uL (ref 1.4–7.7)
Neutrophils Relative %: 68.3 % (ref 43.0–77.0)
Platelets: 200 10*3/uL (ref 150.0–400.0)
RBC: 2.86 Mil/uL — AB (ref 3.87–5.11)
RDW: 14.2 % (ref 11.5–15.5)
WBC: 4.5 10*3/uL (ref 4.0–10.5)

## 2017-01-29 LAB — COMPREHENSIVE METABOLIC PANEL
ALBUMIN: 3.6 g/dL (ref 3.5–5.2)
ALK PHOS: 71 U/L (ref 39–117)
ALT: 10 U/L (ref 0–35)
AST: 11 U/L (ref 0–37)
BILIRUBIN TOTAL: 0.2 mg/dL (ref 0.2–1.2)
BUN: 26 mg/dL — ABNORMAL HIGH (ref 6–23)
CO2: 23 mEq/L (ref 19–32)
Calcium: 8.8 mg/dL (ref 8.4–10.5)
Chloride: 106 mEq/L (ref 96–112)
Creatinine, Ser: 1.3 mg/dL — ABNORMAL HIGH (ref 0.40–1.20)
GFR: 41.58 mL/min — AB (ref >60.00)
GLUCOSE: 89 mg/dL (ref 70–99)
POTASSIUM: 4.3 meq/L (ref 3.5–5.1)
Sodium: 136 mEq/L (ref 135–145)
TOTAL PROTEIN: 6.2 g/dL (ref 6.0–8.3)

## 2017-01-29 NOTE — Assessment & Plan Note (Signed)
She appears comfortable and more alert since she ran out of her narcotic.  advise to continue tylenol

## 2017-01-29 NOTE — Assessment & Plan Note (Signed)
Managed with Paxil and low dose valium, aggravated by air hunger . Refill history confirmed via [place] Controlled Substance databas, accessed by me today.Anna Prince

## 2017-01-29 NOTE — Assessment & Plan Note (Signed)
Well controlled on current regimen. Renal function stable, no changes today.  Lab Results  Component Value Date   CREATININE 1.30 (H) 01/28/2017   Lab Results  Component Value Date   NA 136 01/28/2017   K 4.3 01/28/2017   CL 106 01/28/2017   CO2 23 01/28/2017

## 2017-01-29 NOTE — Assessment & Plan Note (Signed)
improved with paxil and remeron .  No changes today

## 2017-01-29 NOTE — Assessment & Plan Note (Signed)
LDL and triglycerides are at goal on current medications. She has no side effects and liver enzymes are normal. No changes today  Lab Results  Component Value Date   CHOL 150 09/19/2016   HDL 39.90 09/19/2016   LDLCALC 95 09/19/2016   LDLDIRECT 75.0 09/19/2016   TRIG 71.0 09/19/2016   CHOLHDL 4 09/19/2016   Lab Results  Component Value Date   ALT 10 01/28/2017   AST 11 01/28/2017   ALKPHOS 71 01/28/2017   BILITOT 0.2 01/28/2017

## 2017-01-31 ENCOUNTER — Other Ambulatory Visit: Payer: Self-pay | Admitting: Internal Medicine

## 2017-01-31 DIAGNOSIS — N183 Chronic kidney disease, stage 3 (moderate): Principal | ICD-10-CM

## 2017-01-31 DIAGNOSIS — D631 Anemia in chronic kidney disease: Secondary | ICD-10-CM

## 2017-02-06 ENCOUNTER — Other Ambulatory Visit: Payer: Self-pay | Admitting: Adult Health

## 2017-02-06 ENCOUNTER — Other Ambulatory Visit: Payer: Self-pay | Admitting: Internal Medicine

## 2017-02-07 NOTE — Telephone Encounter (Signed)
PAXIL REFILLED

## 2017-02-21 DIAGNOSIS — J449 Chronic obstructive pulmonary disease, unspecified: Secondary | ICD-10-CM | POA: Diagnosis not present

## 2017-02-21 DIAGNOSIS — J9611 Chronic respiratory failure with hypoxia: Secondary | ICD-10-CM | POA: Diagnosis not present

## 2017-03-23 DIAGNOSIS — J449 Chronic obstructive pulmonary disease, unspecified: Secondary | ICD-10-CM | POA: Diagnosis not present

## 2017-03-23 DIAGNOSIS — J9611 Chronic respiratory failure with hypoxia: Secondary | ICD-10-CM | POA: Diagnosis not present

## 2017-03-26 ENCOUNTER — Encounter: Payer: Self-pay | Admitting: *Deleted

## 2017-03-26 ENCOUNTER — Other Ambulatory Visit: Payer: Self-pay

## 2017-03-26 ENCOUNTER — Inpatient Hospital Stay
Admission: EM | Admit: 2017-03-26 | Discharge: 2017-04-01 | DRG: 377 | Disposition: A | Discharge status: Home Health | Payer: Medicare Other | Attending: Internal Medicine | Admitting: Internal Medicine

## 2017-03-26 DIAGNOSIS — Z7982 Long term (current) use of aspirin: Secondary | ICD-10-CM

## 2017-03-26 DIAGNOSIS — D62 Acute posthemorrhagic anemia: Secondary | ICD-10-CM | POA: Diagnosis not present

## 2017-03-26 DIAGNOSIS — N183 Chronic kidney disease, stage 3 (moderate): Secondary | ICD-10-CM | POA: Diagnosis present

## 2017-03-26 DIAGNOSIS — K5520 Angiodysplasia of colon without hemorrhage: Secondary | ICD-10-CM | POA: Diagnosis not present

## 2017-03-26 DIAGNOSIS — Z955 Presence of coronary angioplasty implant and graft: Secondary | ICD-10-CM | POA: Diagnosis not present

## 2017-03-26 DIAGNOSIS — Z7902 Long term (current) use of antithrombotics/antiplatelets: Secondary | ICD-10-CM

## 2017-03-26 DIAGNOSIS — F17210 Nicotine dependence, cigarettes, uncomplicated: Secondary | ICD-10-CM | POA: Diagnosis present

## 2017-03-26 DIAGNOSIS — N170 Acute kidney failure with tubular necrosis: Secondary | ICD-10-CM | POA: Diagnosis present

## 2017-03-26 DIAGNOSIS — Z885 Allergy status to narcotic agent status: Secondary | ICD-10-CM

## 2017-03-26 DIAGNOSIS — I252 Old myocardial infarction: Secondary | ICD-10-CM | POA: Diagnosis not present

## 2017-03-26 DIAGNOSIS — K5730 Diverticulosis of large intestine without perforation or abscess without bleeding: Secondary | ICD-10-CM | POA: Diagnosis not present

## 2017-03-26 DIAGNOSIS — N179 Acute kidney failure, unspecified: Secondary | ICD-10-CM | POA: Diagnosis not present

## 2017-03-26 DIAGNOSIS — K921 Melena: Secondary | ICD-10-CM

## 2017-03-26 DIAGNOSIS — R197 Diarrhea, unspecified: Secondary | ICD-10-CM | POA: Diagnosis not present

## 2017-03-26 DIAGNOSIS — K253 Acute gastric ulcer without hemorrhage or perforation: Secondary | ICD-10-CM | POA: Diagnosis not present

## 2017-03-26 DIAGNOSIS — K259 Gastric ulcer, unspecified as acute or chronic, without hemorrhage or perforation: Secondary | ICD-10-CM | POA: Diagnosis not present

## 2017-03-26 DIAGNOSIS — K922 Gastrointestinal hemorrhage, unspecified: Secondary | ICD-10-CM | POA: Diagnosis not present

## 2017-03-26 DIAGNOSIS — M1990 Unspecified osteoarthritis, unspecified site: Secondary | ICD-10-CM | POA: Diagnosis present

## 2017-03-26 DIAGNOSIS — E782 Mixed hyperlipidemia: Secondary | ICD-10-CM | POA: Diagnosis not present

## 2017-03-26 DIAGNOSIS — Z8249 Family history of ischemic heart disease and other diseases of the circulatory system: Secondary | ICD-10-CM | POA: Diagnosis not present

## 2017-03-26 DIAGNOSIS — K5790 Diverticulosis of intestine, part unspecified, without perforation or abscess without bleeding: Secondary | ICD-10-CM | POA: Diagnosis not present

## 2017-03-26 DIAGNOSIS — E785 Hyperlipidemia, unspecified: Secondary | ICD-10-CM | POA: Diagnosis present

## 2017-03-26 DIAGNOSIS — F329 Major depressive disorder, single episode, unspecified: Secondary | ICD-10-CM | POA: Diagnosis present

## 2017-03-26 DIAGNOSIS — K5791 Diverticulosis of intestine, part unspecified, without perforation or abscess with bleeding: Secondary | ICD-10-CM | POA: Diagnosis not present

## 2017-03-26 DIAGNOSIS — D649 Anemia, unspecified: Secondary | ICD-10-CM

## 2017-03-26 DIAGNOSIS — K5521 Angiodysplasia of colon with hemorrhage: Principal | ICD-10-CM | POA: Diagnosis present

## 2017-03-26 DIAGNOSIS — I129 Hypertensive chronic kidney disease with stage 1 through stage 4 chronic kidney disease, or unspecified chronic kidney disease: Secondary | ICD-10-CM | POA: Diagnosis present

## 2017-03-26 DIAGNOSIS — I2510 Atherosclerotic heart disease of native coronary artery without angina pectoris: Secondary | ICD-10-CM | POA: Diagnosis present

## 2017-03-26 DIAGNOSIS — H3530 Unspecified macular degeneration: Secondary | ICD-10-CM | POA: Diagnosis present

## 2017-03-26 DIAGNOSIS — K2270 Barrett's esophagus without dysplasia: Secondary | ICD-10-CM | POA: Diagnosis present

## 2017-03-26 DIAGNOSIS — M810 Age-related osteoporosis without current pathological fracture: Secondary | ICD-10-CM | POA: Diagnosis present

## 2017-03-26 DIAGNOSIS — E8809 Other disorders of plasma-protein metabolism, not elsewhere classified: Secondary | ICD-10-CM | POA: Diagnosis not present

## 2017-03-26 DIAGNOSIS — Q2733 Arteriovenous malformation of digestive system vessel: Secondary | ICD-10-CM | POA: Diagnosis not present

## 2017-03-26 DIAGNOSIS — Z7901 Long term (current) use of anticoagulants: Secondary | ICD-10-CM | POA: Diagnosis not present

## 2017-03-26 DIAGNOSIS — K449 Diaphragmatic hernia without obstruction or gangrene: Secondary | ICD-10-CM | POA: Diagnosis not present

## 2017-03-26 DIAGNOSIS — F419 Anxiety disorder, unspecified: Secondary | ICD-10-CM | POA: Diagnosis present

## 2017-03-26 DIAGNOSIS — Z66 Do not resuscitate: Secondary | ICD-10-CM | POA: Diagnosis present

## 2017-03-26 DIAGNOSIS — Z8711 Personal history of peptic ulcer disease: Secondary | ICD-10-CM | POA: Diagnosis not present

## 2017-03-26 DIAGNOSIS — I998 Other disorder of circulatory system: Secondary | ICD-10-CM | POA: Diagnosis not present

## 2017-03-26 DIAGNOSIS — K219 Gastro-esophageal reflux disease without esophagitis: Secondary | ICD-10-CM | POA: Diagnosis present

## 2017-03-26 DIAGNOSIS — J449 Chronic obstructive pulmonary disease, unspecified: Secondary | ICD-10-CM | POA: Diagnosis present

## 2017-03-26 DIAGNOSIS — I25118 Atherosclerotic heart disease of native coronary artery with other forms of angina pectoris: Secondary | ICD-10-CM | POA: Diagnosis not present

## 2017-03-26 DIAGNOSIS — Z8673 Personal history of transient ischemic attack (TIA), and cerebral infarction without residual deficits: Secondary | ICD-10-CM

## 2017-03-26 DIAGNOSIS — K279 Peptic ulcer, site unspecified, unspecified as acute or chronic, without hemorrhage or perforation: Secondary | ICD-10-CM | POA: Diagnosis not present

## 2017-03-26 DIAGNOSIS — K648 Other hemorrhoids: Secondary | ICD-10-CM | POA: Diagnosis not present

## 2017-03-26 DIAGNOSIS — I739 Peripheral vascular disease, unspecified: Secondary | ICD-10-CM | POA: Diagnosis present

## 2017-03-26 DIAGNOSIS — I10 Essential (primary) hypertension: Secondary | ICD-10-CM | POA: Diagnosis not present

## 2017-03-26 HISTORY — DX: Gastrointestinal hemorrhage, unspecified: K92.2

## 2017-03-26 HISTORY — DX: Other ill-defined heart diseases: I51.89

## 2017-03-26 HISTORY — DX: Iron deficiency anemia, unspecified: D50.9

## 2017-03-26 LAB — CBC
HCT: 17.3 % — ABNORMAL LOW (ref 35.0–47.0)
Hemoglobin: 5.6 g/dL — ABNORMAL LOW (ref 12.0–16.0)
MCH: 28.3 pg (ref 26.0–34.0)
MCHC: 32.5 g/dL (ref 32.0–36.0)
MCV: 86.9 fL (ref 80.0–100.0)
PLATELETS: 233 10*3/uL (ref 150–440)
RBC: 1.99 MIL/uL — ABNORMAL LOW (ref 3.80–5.20)
RDW: 13.9 % (ref 11.5–14.5)
WBC: 4.8 10*3/uL (ref 3.6–11.0)

## 2017-03-26 LAB — COMPREHENSIVE METABOLIC PANEL
ALBUMIN: 3.3 g/dL — AB (ref 3.5–5.0)
ALK PHOS: 68 U/L (ref 38–126)
ALT: 9 U/L — AB (ref 14–54)
AST: 14 U/L — ABNORMAL LOW (ref 15–41)
Anion gap: 10 (ref 5–15)
BILIRUBIN TOTAL: 0.1 mg/dL — AB (ref 0.3–1.2)
BUN: 28 mg/dL — AB (ref 6–20)
CALCIUM: 8.5 mg/dL — AB (ref 8.9–10.3)
CO2: 23 mmol/L (ref 22–32)
CREATININE: 1.68 mg/dL — AB (ref 0.44–1.00)
Chloride: 105 mmol/L (ref 101–111)
GFR calc Af Amer: 31 mL/min — ABNORMAL LOW (ref >60)
GFR calc non Af Amer: 27 mL/min — ABNORMAL LOW (ref >60)
GLUCOSE: 102 mg/dL — AB (ref 65–99)
Potassium: 4.5 mmol/L (ref 3.5–5.1)
SODIUM: 138 mmol/L (ref 135–145)
Total Protein: 6.1 g/dL — ABNORMAL LOW (ref 6.5–8.1)

## 2017-03-26 LAB — PREPARE RBC (CROSSMATCH)

## 2017-03-26 LAB — HEMOGLOBIN AND HEMATOCRIT, BLOOD
HCT: 16.5 % — ABNORMAL LOW (ref 35.0–47.0)
Hemoglobin: 5.2 g/dL — ABNORMAL LOW (ref 12.0–16.0)

## 2017-03-26 LAB — LIPASE, BLOOD: Lipase: 31 U/L (ref 11–51)

## 2017-03-26 MED ORDER — MIRTAZAPINE 15 MG PO TABS
45.0000 mg | ORAL_TABLET | Freq: Every day | ORAL | Status: DC
  Administered 2017-03-26 – 2017-03-31 (×6): 45 mg via ORAL
  Filled 2017-03-26 (×6): qty 3

## 2017-03-26 MED ORDER — ATORVASTATIN CALCIUM 20 MG PO TABS
40.0000 mg | ORAL_TABLET | Freq: Every day | ORAL | Status: DC
  Administered 2017-03-28 – 2017-03-30 (×3): 40 mg via ORAL
  Filled 2017-03-26 (×5): qty 2

## 2017-03-26 MED ORDER — LISINOPRIL 20 MG PO TABS: 20.0000 mg | ORAL_TABLET | Freq: Every day | ORAL | Status: DC

## 2017-03-26 MED ORDER — HYDROCODONE-ACETAMINOPHEN 5-325 MG PO TABS: 1.0000 | ORAL_TABLET | Freq: Four times a day (QID) | ORAL | Status: DC | PRN

## 2017-03-26 MED ORDER — FERROUS SULFATE 325 (65 FE) MG PO TABS
325.0000 mg | ORAL_TABLET | Freq: Every day | ORAL | Status: DC
  Administered 2017-03-29 – 2017-04-01 (×3): 325 mg via ORAL
  Filled 2017-03-26 (×4): qty 1

## 2017-03-26 MED ORDER — ONDANSETRON HCL 4 MG PO TABS: 4.0000 mg | ORAL_TABLET | Freq: Four times a day (QID) | ORAL | Status: DC | PRN

## 2017-03-26 MED ORDER — SODIUM CHLORIDE 0.9 % IV SOLN
8.0000 mg/h | INTRAVENOUS | Status: DC
  Administered 2017-03-26: 8 mg/h via INTRAVENOUS
  Filled 2017-03-26 (×2): qty 80

## 2017-03-26 MED ORDER — SODIUM CHLORIDE 0.9 % IV SOLN
INTRAVENOUS | Status: AC
  Administered 2017-03-26 – 2017-03-27 (×2): via INTRAVENOUS

## 2017-03-26 MED ORDER — MOMETASONE FURO-FORMOTEROL FUM 100-5 MCG/ACT IN AERO
2.0000 | INHALATION_SPRAY | Freq: Two times a day (BID) | RESPIRATORY_TRACT | Status: DC
  Administered 2017-03-26 – 2017-04-01 (×11): 2 via RESPIRATORY_TRACT
  Filled 2017-03-26: qty 8.8

## 2017-03-26 MED ORDER — PAROXETINE HCL 10 MG PO TABS
30.0000 mg | ORAL_TABLET | Freq: Every day | ORAL | Status: DC
Start: 2017-03-27 — End: 2017-04-01
  Administered 2017-03-28 – 2017-04-01 (×4): 30 mg via ORAL
  Filled 2017-03-26 (×6): qty 3

## 2017-03-26 MED ORDER — ONDANSETRON HCL 4 MG/2ML IJ SOLN: 4.0000 mg | Freq: Four times a day (QID) | INTRAMUSCULAR | Status: DC | PRN

## 2017-03-26 MED ORDER — DIAZEPAM 5 MG PO TABS
5.0000 mg | ORAL_TABLET | Freq: Three times a day (TID) | ORAL | Status: DC | PRN
  Administered 2017-03-27 – 2017-03-31 (×5): 5 mg via ORAL
  Filled 2017-03-26 (×5): qty 1

## 2017-03-26 MED ORDER — SODIUM CHLORIDE 0.9 % IV SOLN
10.0000 mL/h | Freq: Once | INTRAVENOUS | Status: AC
  Administered 2017-03-26: 10 mL/h via INTRAVENOUS

## 2017-03-26 MED ORDER — MEGESTROL ACETATE 20 MG PO TABS
20.0000 mg | ORAL_TABLET | Freq: Every day | ORAL | Status: DC
  Administered 2017-03-28 – 2017-04-01 (×4): 20 mg via ORAL
  Filled 2017-03-26 (×7): qty 1

## 2017-03-26 MED ORDER — PANTOPRAZOLE SODIUM 40 MG IV SOLR
80.0000 mg | Freq: Once | INTRAVENOUS | Status: AC
  Administered 2017-03-26: 22:00:00 80 mg via INTRAVENOUS
  Filled 2017-03-26: qty 80

## 2017-03-26 MED ORDER — PANTOPRAZOLE SODIUM 40 MG IV SOLR: 40.0000 mg | Freq: Two times a day (BID) | INTRAVENOUS | Status: DC

## 2017-03-26 MED ORDER — METOPROLOL TARTRATE 25 MG PO TABS
25.0000 mg | ORAL_TABLET | Freq: Two times a day (BID) | ORAL | Status: DC
  Administered 2017-03-26 – 2017-04-01 (×10): 25 mg via ORAL
  Filled 2017-03-26 (×11): qty 1

## 2017-03-26 MED ORDER — NITROGLYCERIN 0.4 MG SL SUBL: 0.4000 mg | SUBLINGUAL_TABLET | SUBLINGUAL | Status: DC | PRN

## 2017-03-26 MED ORDER — METOPROLOL TARTRATE 25 MG PO TABS: 12.5000 mg | ORAL_TABLET | Freq: Two times a day (BID) | ORAL | Status: DC

## 2017-03-26 MED ORDER — ALBUTEROL SULFATE (2.5 MG/3ML) 0.083% IN NEBU: 2.5000 mg | INHALATION_SOLUTION | Freq: Four times a day (QID) | RESPIRATORY_TRACT | Status: DC | PRN

## 2017-03-26 NOTE — ED Notes (Signed)
Pt reports bloody stools last week but did not see a doctor.

## 2017-03-26 NOTE — ED Notes (Signed)
Pt given meal tray and drink.

## 2017-03-26 NOTE — ED Notes (Addendum)
Pt reports low abd pain and diarrhea x 2 today.  Sx for 2 weeks.  Decreased appetite today.   No back pain  No urinary sx.  Pt also reports feeling dizzy.  No chest pain.  Pt has a cough ,  cig smoker.  No fever.       Pt brought in via ems from home.  Pt lives with daughters.  Pt alert   Speech clear.

## 2017-03-26 NOTE — ED Triage Notes (Signed)
Pt reports having had lower abd pain and diarrhea for 2 weeks. Pt reports she has intermittent dizzy spells but denies syncopal episodes. Pt denies vomiting but has intermittent nausea. Pt also reports having noted bright red blood in depends. No dark tarry stool reported.

## 2017-03-26 NOTE — ED Provider Notes (Signed)
[facility] Emergency Department Provider Note  ____________________________________________  Time seen: Approximately 7:08 PM  I have reviewed the triage vital signs and the nursing notes.   HISTORY  Chief Complaint Abdominal Pain and Diarrhea   HPI Anna Prince is a 83 y.o. female with a history of diverticulosis, peptic ulcer disease, AAA, Barrett's esophagus, COPD, CAD on Plavix, hypertension and hyperlipidemiawho presents for evaluation of diarrhea and dizziness. Patient reports that she has been having on and off diarrhea for several months. She was admitted here 6 months ago with melena and had an endoscopy showing peptic ulcer disease. Patient endorses no further episodes of melena but has noted some blood in her stool intermittently. A week ago she had an episode with large amount of blood in the toilet but since then no further bleeding. She occasionally will see a small streak of blood into her diapers. No melena, coffee-ground emesis, hematemesis, nausea, vomiting. She is complaining of intermittent lower sharp abdominal pain also for several days. No pain at this time. Patient reports over the last week that she's been feeling dizzy like she is going to pass out. No chest pain or shortness of breath. No fever or chills.  Past Medical History:  Diagnosis Date  . AAA (abdominal aortic aneurysm) ([place])   . Anemia 2012   of acute blood loss, resolved  . Anxiety   . Aortic aneurysm ([place])   . Arthritis   . Barrett's esophagus 2007  . Bursitis 2012   left hip, improved with periodic steroid injection [facility])  . COPD (chronic obstructive pulmonary disease) ([place])   . Coronary artery disease   . Depression   . GERD (gastroesophageal reflux disease)    with Barretts Esophagus  . Hyperlipidemia   . Hypertension   . Lumbago   . Macular degeneration   . Osteoporosis   . Sigmoid diverticulosis    by colonoscopy  . Stroke [facility])      Patient Active Problem List   Diagnosis Date Noted  . Diarrhea   . Acute gastric ulcer without hemorrhage or perforation   . Colitis 11/05/2016  . GERD (gastroesophageal reflux disease) 11/05/2016  . Coronary artery disease 08/28/2016  . Non-ST elevation (NSTEMI) myocardial infarction ([place]) 08/24/2016  . Chronic respiratory failure with hypoxia ([place]) 05/17/2016  . Generalized muscle weakness 04/16/2015  . Hospital discharge follow-up 04/16/2015  . Coronary artery disease due to lipid rich plaque 04/14/2015  . Anxiety 04/14/2015  . Bursitis of left hip 11/27/2014  . Anemia 11/25/2014  . Elevated liver enzymes 08/25/2014  . History of fracture of pelvis or lower extremity 08/25/2014  . Vascular dementia with depressed mood 02/20/2014  . Chronic pain syndrome 05/27/2013  . Tobacco abuse counseling 04/10/2013  . Gastric ulcer requiring drug therapy   . Screening for colon cancer 06/18/2011  . COPD (chronic obstructive pulmonary disease) ([place]) 06/18/2011  . Skin cancer of nose 12/17/2010  . Vitamin D deficiency 12/17/2010  . Hyperlipidemia LDL goal <100 12/17/2010  . Screening for malignant neoplasm of breast 12/17/2010  . Encounter for long-term (current) use of other medications 12/17/2010  . S/P abdominal aortic aneurysm repair 12/17/2010  . Diverticulosis, sigmoid 12/08/2010  . Hyperlipidemia 12/08/2010  . Hypertension 12/08/2010  . Lumbago 12/08/2010  . Osteoporosis 12/08/2010  . Major depressive disorder, recurrent episode, moderate ([place]) 12/08/2010    Past Surgical History:  Procedure Laterality Date  . ABDOMINAL AORTIC ANEURYSM REPAIR  July 2010   [facility]  .  APPENDECTOMY    . BACK SURGERY     X2..1975 arachnoid cyst cervical region([place]);1997 lumbosacral tumor,9 hr surgery  . CARDIAC CATHETERIZATION    . CARDIAC CATHETERIZATION    . CORONARY ATHERECTOMY N/A 08/31/2016   Procedure: Coronary Atherectomy;  Surgeon: Nelva [place], MD;  Location: [place]  CV LAB;  Service: Cardiovascular;  Laterality: N/A;  . ESOPHAGOGASTRODUODENOSCOPY (EGD) WITH PROPOFOL N/A 09/02/2016   Procedure: ESOPHAGOGASTRODUODENOSCOPY (EGD) WITH PROPOFOL;  Surgeon: Otis Brace, MD;  Location: [place];  Service: Gastroenterology;  Laterality: N/A;  . ESOPHAGOGASTRODUODENOSCOPY (EGD) WITH PROPOFOL N/A 11/06/2016   Procedure: ESOPHAGOGASTRODUODENOSCOPY (EGD) WITH PROPOFOL;  Surgeon: Lucilla Lame, MD;  Location: [facility] ENDOSCOPY;  Service: Endoscopy;  Laterality: N/A;  . INTRAVASCULAR ULTRASOUND/IVUS N/A 08/31/2016   Procedure: Intravascular Ultrasound/IVUS;  Surgeon: Nelva [place], MD;  Location: [place] CV LAB;  Service: Cardiovascular;  Laterality: N/A;  . LEFT HEART CATH AND CORONARY ANGIOGRAPHY N/A 08/27/2016   Procedure: Left Heart Cath and Coronary Angiography;  Surgeon: Teodoro Spray, MD;  Location: [place] CV LAB;  Service: Cardiovascular;  Laterality: N/A;    Prior to Admission medications   Medication Sig Start Date End Date Taking? Authorizing Provider  aspirin EC 81 MG EC tablet Take 1 tablet (81 mg total) by mouth daily. 08/28/16  Yes Bettey Costa, MD  atorvastatin (LIPITOR) 40 MG tablet Take 1 tablet (40 mg total) by mouth daily at 6 PM. 10/08/16  Yes Crecencio Mc, MD  clopidogrel (PLAVIX) 75 MG tablet Take 1 tablet (75 mg total) by mouth daily. 10/08/16  Yes Crecencio Mc, MD  diazepam (VALIUM) 5 MG tablet Take 1 tablet (5 mg total) by mouth 2 (two) times daily as needed. 01/28/17  Yes Crecencio Mc, MD  ferrous sulfate 325 (65 FE) MG tablet Take 1 tablet (325 mg total) by mouth daily with breakfast. 09/03/16  Yes Lendon Colonel, NP  lisinopril (PRINIVIL,ZESTRIL) 20 MG tablet TAKE 1 TABLET BY MOUTH EVERY DAY 01/11/17  Yes Crecencio Mc, MD  mirtazapine (REMERON) 45 MG tablet Take 1 tablet (45 mg total) by mouth at bedtime. 10/08/16  Yes Crecencio Mc, MD  pantoprazole (PROTONIX) 40 MG tablet Take 1 tablet (40 mg total) by mouth 2 (two)  times daily. 11/08/16  Yes Henreitta Leber, MD  PARoxetine (PAXIL) 30 MG tablet TAKE 1 TABLET BY MOUTH DAILY 02/07/17  Yes Crecencio Mc, MD  albuterol (PROVENTIL HFA;VENTOLIN HFA) 108 (90 Base) MCG/ACT inhaler Inhale 2 puffs into the lungs every 6 (six) hours as needed for wheezing or shortness of breath. 11/11/15   Alfred Levins, [place], MD  budesonide-formoterol [facility]) 80-4.5 MCG/ACT inhaler Inhale 2 puffs into the lungs 2 (two) times daily. Patient not taking: Reported on 03/26/2017 10/08/16   Crecencio Mc, MD  HYDROcodone-acetaminophen (NORCO/VICODIN) 5-325 MG tablet Take 1 tablet by mouth every 6 (six) hours as needed for moderate pain. Maximum 2 daily. 08/24/16   Crecencio Mc, MD  lisinopril (PRINIVIL,ZESTRIL) 20 MG tablet TAKE 1 TABLET BY MOUTH EVERY DAY Patient not taking: Reported on 03/26/2017 10/27/15   Crecencio Mc, MD  megestrol (MEGACE) 40 MG tablet TAKE 1 TABLET(40 MG) BY MOUTH DAILY Patient not taking: Reported on 03/26/2017 02/06/17   Crecencio Mc, MD  metoprolol tartrate (LOPRESSOR) 25 MG tablet Take 0.5 tablets (12.5 mg total) by mouth 2 (two) times daily. Patient not taking: Reported on 03/26/2017 08/27/16   Bettey Costa, MD  nitroGLYCERIN (NITROSTAT) 0.4 MG SL tablet Place 1 tablet (  0.4 mg total) under the tongue every 5 (five) minutes x 3 doses as needed for chest pain. 09/28/16   Crecencio Mc, MD  PARoxetine (PAXIL) 30 MG tablet Take 1 tablet (30 mg total) by mouth daily. To be refilled by PCP Patient not taking: Reported on 03/26/2017 10/08/16   Crecencio Mc, MD    Allergies Morphine sulfate  Family History  Problem Relation Age of Onset  . Coronary artery disease Mother   . Coronary artery disease Father 51  . Heart attack Father   . Cancer Neg Hx        no breast,coon or ovarian ca    Social History Social History   Tobacco Use  . Smoking status: Current Every Day Smoker    Packs/day: 0.50    Types: Cigarettes  . Smokeless tobacco: Never Used  .  Tobacco comment: HAS BEEN SMOKING 30+ YEARS,RESUMED TOBACCO USE AFTER AAA REPAIR  Substance Use Topics  . Alcohol use: No  . Drug use: No    Review of Systems  Constitutional: Negative for fever. + dizziness Eyes: Negative for visual changes. ENT: Negative for sore throat. Neck: No neck pain  Cardiovascular: Negative for chest pain. Respiratory: Negative for shortness of breath. Gastrointestinal: + abdominal pain and diarrhea. No vomiting  Genitourinary: Negative for dysuria. Musculoskeletal: Negative for back pain. Skin: Negative for rash. Neurological: Negative for headaches, weakness or numbness. Psych: No SI or HI  ____________________________________________   PHYSICAL EXAM:  VITAL SIGNS: ED Triage Vitals  Enc Vitals Group     BP 03/26/17 1639 110/60     Pulse Rate 03/26/17 1639 71     Resp 03/26/17 1639 16     Temp 03/26/17 1639 97.9 F (36.6 C)     Temp Source 03/26/17 1639 Oral     SpO2 03/26/17 1639 100 %     Weight 03/26/17 1640 117 lb (53.1 kg)     Height 03/26/17 1640 5\' 5"  (1.651 m)     Head Circumference --      Peak Flow --      Pain Score 03/26/17 1639 7     Pain Loc --      Pain Edu? --      Excl. in [place]? --     Constitutional: Alert and oriented. Well appearing and in no apparent distress. HEENT:      Head: Normocephalic and atraumatic.         Eyes: Conjunctivae are normal. Sclera is non-icteric.       Mouth/Throat: Mucous membranes are moist.       Neck: Supple with no signs of meningismus. Cardiovascular: Regular rate and rhythm. No murmurs, gallops, or rubs. 2+ symmetrical distal pulses are present in all extremities. No JVD. Respiratory: Normal respiratory effort. Lungs are clear to auscultation bilaterally. No wheezes, crackles, or rhonchi.  Gastrointestinal: Soft, non tender, and non distended with positive bowel sounds. No rebound or guarding. Genitourinary: No CVA tenderness. Rectal exam showing brown stool guaiac  negative Musculoskeletal: Nontender with normal range of motion in all extremities. No edema, cyanosis, or erythema of extremities. Neurologic: Normal speech and language. Face is symmetric. Moving all extremities. No gross focal neurologic deficits are appreciated. Skin: Skin is warm, dry and intact. No rash noted. Psychiatric: Mood and affect are normal. Speech and behavior are normal.  ____________________________________________   LABS (all labs ordered are listed, but only abnormal results are displayed)  Labs Reviewed  COMPREHENSIVE METABOLIC PANEL - Abnormal; Notable for the  following components:      Result Value   Glucose, Bld 102 (*)    BUN 28 (*)    Creatinine, Ser 1.68 (*)    Calcium 8.5 (*)    Total Protein 6.1 (*)    Albumin 3.3 (*)    AST 14 (*)    ALT 9 (*)    Total Bilirubin 0.1 (*)    GFR calc non Af Amer 27 (*)    GFR calc Af Amer 31 (*)    All other components within normal limits  CBC - Abnormal; Notable for the following components:   RBC 1.99 (*)    Hemoglobin 5.6 (*)    HCT 17.3 (*)    All other components within normal limits  LIPASE, BLOOD  URINALYSIS, COMPLETE (UACMP) WITH MICROSCOPIC  TYPE AND SCREEN  PREPARE RBC (CROSSMATCH)   ____________________________________________  EKG  none ____________________________________________  RADIOLOGY  none  ____________________________________________   PROCEDURES  Procedure(s) performed: None Procedures Critical Care performed: yes  CRITICAL CARE Performed by: Rudene Re  ?  Total critical care time: 35 min  Critical care time was exclusive of separately billable procedures and treating other patients.  Critical care was necessary to treat or prevent imminent or life-threatening deterioration.  Critical care was time spent personally by me on the following activities: development of treatment plan with patient and/or surrogate as well as nursing, discussions with consultants,  evaluation of patient's response to treatment, examination of patient, obtaining history from patient or surrogate, ordering and performing treatments and interventions, ordering and review of laboratory studies, ordering and review of radiographic studies, pulse oximetry and re-evaluation of patient's condition.  ____________________________________________   INITIAL IMPRESSION / ASSESSMENT AND PLAN / ED COURSE  83 y.o. female with a history of diverticulosis, peptic ulcer disease, AAA, Barrett's esophagus, COPD, CAD on Plavix, hypertension and hyperlipidemiawho presents for evaluation of intermittent episodes of diarrhea for several months, bloody stool, and dizziness.patient is hemodynamically stable at this time, rectal exam showing no evidence of melena or guaiac positive stool.patient's hemoglobin of 5.6 (baseline is 10) consistent with GI bleed. No active bleed at this time. Patient is on Plavix which will be held. Patient is to be transfused 2 units of blood. Will admit to Hospitalist service.       As part of my medical decision making, I reviewed the following data within the [place] notes reviewed and incorporated, Labs reviewed , Old chart reviewed, Discussed with admitting physician , Notes from prior ED visits and [place] Controlled Substance Database    Pertinent labs & imaging results that were available during my care of the patient were reviewed by me and considered in my medical decision making (see chart for details).    ____________________________________________   FINAL CLINICAL IMPRESSION(S) / ED DIAGNOSES  Final diagnoses:  Diarrhea, unspecified type  Anemia, unspecified type  Gastrointestinal hemorrhage, unspecified gastrointestinal hemorrhage type      NEW MEDICATIONS STARTED DURING THIS VISIT:  ED Discharge Orders    None       Note:  This document was prepared using Dragon voice recognition software and may include  unintentional dictation errors.    Rudene Re, MD 03/26/17 [phone]

## 2017-03-26 NOTE — ED Notes (Signed)
Unable to void.  Pt alert.

## 2017-03-26 NOTE — ED Notes (Signed)
Report off to [employer]

## 2017-03-26 NOTE — H&P (Signed)
[facility] NAME: Anna Prince    MR#:  [id number]  DATE OF BIRTH:  21-Sep-1931  DATE OF ADMISSION:  03/26/2017  PRIMARY CARE PHYSICIAN: Crecencio Mc, MD   REQUESTING/REFERRING PHYSICIAN: Rudene Re, MD  CHIEF COMPLAINT:  Dizziness and diarrhea  HISTORY OF PRESENT ILLNESS:  Anna Prince  is a 83 y.o. female with a known history of peptic ulcer disease, diverticulosis, COPD, coronary artery disease status post stent placement in May 2018 on aspirin and Plavix, essential hypertension and hyperlipidemia is presenting to the ED with a chief complaint of dizziness and diarrhea. Patient has been experiencing intermittent episodes of diarrhea for several months and had endoscopy done for melena and diagnosed with peptic ulcer disease 6 months ago. Following that she did not have any GI bleed but noticing some blood in her stool intermittently. One week ago she had large amount of blood in her stool. Denies any chest pain now. Patient denies any palpitations but came into the ED for dizziness. Hemoglobin is at 5.6. 2 units of blood transition ordered and hospitalist team is called to admit the patient.    PAST MEDICAL HISTORY:   Past Medical History:  Diagnosis Date  . AAA (abdominal aortic aneurysm) ([place])   . Anemia 2012   of acute blood loss, resolved  . Anxiety   . Aortic aneurysm ([place])   . Arthritis   . Barrett's esophagus 2007  . Bursitis 2012   left hip, improved with periodic steroid injection [facility])  . COPD (chronic obstructive pulmonary disease) ([place])   . Coronary artery disease   . Depression   . GERD (gastroesophageal reflux disease)    with Barretts Esophagus  . Hyperlipidemia   . Hypertension   . Lumbago   . Macular degeneration   . Osteoporosis   . Sigmoid diverticulosis    by colonoscopy  . Stroke [facility])     PAST SURGICAL HISTOIRY:   Past Surgical History:  Procedure Laterality  Date  . ABDOMINAL AORTIC ANEURYSM REPAIR  July 2010   [facility]  . APPENDECTOMY    . BACK SURGERY     X2..1975 arachnoid cyst cervical region([place]);1997 lumbosacral tumor,9 hr surgery  . CARDIAC CATHETERIZATION    . CARDIAC CATHETERIZATION    . CORONARY ATHERECTOMY N/A 08/31/2016   Procedure: Coronary Atherectomy;  Surgeon: Nelva [place], MD;  Location: [place] [facility];  Service: Cardiovascular;  Laterality: N/A;  . ESOPHAGOGASTRODUODENOSCOPY (EGD) WITH PROPOFOL N/A 09/02/2016   Procedure: ESOPHAGOGASTRODUODENOSCOPY (EGD) WITH PROPOFOL;  Surgeon: Otis Brace, MD;  Location: [place];  Service: Gastroenterology;  Laterality: N/A;  . ESOPHAGOGASTRODUODENOSCOPY (EGD) WITH PROPOFOL N/A 11/06/2016   Procedure: ESOPHAGOGASTRODUODENOSCOPY (EGD) WITH PROPOFOL;  Surgeon: Lucilla Lame, MD;  Location: [facility];  Service: Endoscopy;  Laterality: N/A;  . INTRAVASCULAR ULTRASOUND/IVUS N/A 08/31/2016   Procedure: Intravascular Ultrasound/IVUS;  Surgeon: Nelva Bush, MD;  Location: [place] [facility];  Service: Cardiovascular;  Laterality: N/A;  . LEFT HEART CATH AND CORONARY ANGIOGRAPHY N/A 08/27/2016   Procedure: Left Heart Cath and Coronary Angiography;  Surgeon: Teodoro Spray, MD;  Location: [place] [facility];  Service: Cardiovascular;  Laterality: N/A;    SOCIAL HISTORY:   Social History   Tobacco Use  . Smoking status: Current Every Day Smoker    Packs/day: 0.50    Types: Cigarettes  . Smokeless tobacco: Never Used  . Tobacco comment: HAS BEEN SMOKING 30+ YEARS,RESUMED TOBACCO USE AFTER AAA  REPAIR  Substance Use Topics  . Alcohol use: No    FAMILY HISTORY:   Family History  Problem Relation Age of Onset  . Coronary artery disease Mother   . Coronary artery disease Father 32  . Heart attack Father   . Cancer Neg Hx        no breast,coon or ovarian ca    DRUG ALLERGIES:   Allergies  Allergen Reactions  . Morphine Sulfate Other (See Comments)     Reaction:  Hallucinations     REVIEW OF SYSTEMS:  CONSTITUTIONAL: No fever, fatigue or weakness. Reporting dizziness EYES: No blurred or double vision.  EARS, NOSE, AND THROAT: No tinnitus or ear pain.  RESPIRATORY: No cough, shortness of breath, wheezing or hemoptysis.  CARDIOVASCULAR: No chest pain, orthopnea, edema.  GASTROINTESTINAL: No nausea, vomiting, or abdominal pain. Chronic intermittent episodes of diarrhea GENITOURINARY: No dysuria, hematuria.  ENDOCRINE: No polyuria, nocturia,  HEMATOLOGY: No anemia, easy bruising or bleeding SKIN: No rash or lesion. MUSCULOSKELETAL: No joint pain or arthritis.   NEUROLOGIC: No tingling, numbness, weakness.  PSYCHIATRY: No anxiety or depression.   MEDICATIONS AT HOME:   Prior to Admission medications   Medication Sig Start Date End Date Taking? Authorizing Provider  aspirin EC 81 MG EC tablet Take 1 tablet (81 mg total) by mouth daily. 08/28/16  Yes Bettey Costa, MD  atorvastatin (LIPITOR) 40 MG tablet Take 1 tablet (40 mg total) by mouth daily at 6 PM. 10/08/16  Yes Crecencio Mc, MD  clopidogrel (PLAVIX) 75 MG tablet Take 1 tablet (75 mg total) by mouth daily. 10/08/16  Yes Crecencio Mc, MD  diazepam (VALIUM) 5 MG tablet Take 1 tablet (5 mg total) by mouth 2 (two) times daily as needed. 01/28/17  Yes Crecencio Mc, MD  ferrous sulfate 325 (65 FE) MG tablet Take 1 tablet (325 mg total) by mouth daily with breakfast. 09/03/16  Yes Lendon Colonel, NP  lisinopril (PRINIVIL,ZESTRIL) 20 MG tablet TAKE 1 TABLET BY MOUTH EVERY DAY 01/11/17  Yes Crecencio Mc, MD  mirtazapine (REMERON) 45 MG tablet Take 1 tablet (45 mg total) by mouth at bedtime. 10/08/16  Yes Crecencio Mc, MD  pantoprazole (PROTONIX) 40 MG tablet Take 1 tablet (40 mg total) by mouth 2 (two) times daily. 11/08/16  Yes Henreitta Leber, MD  PARoxetine (PAXIL) 30 MG tablet TAKE 1 TABLET BY MOUTH DAILY 02/07/17  Yes Crecencio Mc, MD  albuterol (PROVENTIL HFA;VENTOLIN HFA)  108 (90 Base) MCG/ACT inhaler Inhale 2 puffs into the lungs every 6 (six) hours as needed for wheezing or shortness of breath. 11/11/15   Alfred Levins, [place], MD  budesonide-formoterol [facility] 80-4.5 MCG/ACT inhaler Inhale 2 puffs into the lungs 2 (two) times daily. Patient not taking: Reported on 03/26/2017 10/08/16   Crecencio Mc, MD  HYDROcodone-acetaminophen (NORCO/VICODIN) 5-325 MG tablet Take 1 tablet by mouth every 6 (six) hours as needed for moderate pain. Maximum 2 daily. 08/24/16   Crecencio Mc, MD  lisinopril (PRINIVIL,ZESTRIL) 20 MG tablet TAKE 1 TABLET BY MOUTH EVERY DAY Patient not taking: Reported on 03/26/2017 10/27/15   Crecencio Mc, MD  megestrol (MEGACE) 40 MG tablet TAKE 1 TABLET(40 MG) BY MOUTH DAILY Patient not taking: Reported on 03/26/2017 02/06/17   Crecencio Mc, MD  metoprolol tartrate (LOPRESSOR) 25 MG tablet Take 0.5 tablets (12.5 mg total) by mouth 2 (two) times daily. Patient not taking: Reported on 03/26/2017 08/27/16   Bettey Costa, MD  nitroGLYCERIN (  NITROSTAT) 0.4 MG SL tablet Place 1 tablet (0.4 mg total) under the tongue every 5 (five) minutes x 3 doses as needed for chest pain. 09/28/16   Crecencio Mc, MD  PARoxetine (PAXIL) 30 MG tablet Take 1 tablet (30 mg total) by mouth daily. To be refilled by PCP Patient not taking: Reported on 03/26/2017 10/08/16   Crecencio Mc, MD      VITAL SIGNS:  Blood pressure (!) 160/83, pulse 77, temperature 97.9 F (36.6 C), temperature source Oral, resp. rate 20, height 5\' 5"  (1.651 m), weight 53.1 kg (117 lb), SpO2 100 %.  PHYSICAL EXAMINATION:  GENERAL:  83 y.o.-year-old patient lying in the bed with no acute distress.  EYES: Pupils equal, round, reactive to light and accommodation. No scleral icterus. Extraocular muscles intact.  HEENT: Head atraumatic, normocephalic. Oropharynx and nasopharynx clear.  NECK:  Supple, no jugular venous distention. No thyroid enlargement, no tenderness.  LUNGS: Normal breath sounds  bilaterally, no wheezing, rales,rhonchi or crepitation. No use of accessory muscles of respiration.  CARDIOVASCULAR: S1, S2 normal. No murmurs, rubs, or gallops.  ABDOMEN: Soft, nontender, nondistended. Bowel sounds present. No organomegaly or mass.  EXTREMITIES: No pedal edema, cyanosis, or clubbing.  NEUROLOGIC: Cranial nerves II through XII are intact. Muscle strength 5/5 in all extremities. Sensation intact. Gait not checked.  PSYCHIATRIC: The patient is alert and oriented x 3.  SKIN: No obvious rash, lesion, or ulcer.   LABORATORY PANEL:   CBC Recent Labs  Lab 03/26/17 1638  WBC 4.8  HGB 5.6*  HCT 17.3*  PLT 233   ------------------------------------------------------------------------------------------------------------------  Chemistries  Recent Labs  Lab 03/26/17 1638  NA 138  K 4.5  CL 105  CO2 23  GLUCOSE 102*  BUN 28*  CREATININE 1.68*  CALCIUM 8.5*  AST 14*  ALT 9*  ALKPHOS 68  BILITOT 0.1*   ------------------------------------------------------------------------------------------------------------------  Cardiac Enzymes No results for input(s): TROPONINI in the last 168 hours. ------------------------------------------------------------------------------------------------------------------  RADIOLOGY:  No results found.  EKG:   Orders placed or performed in visit on 09/05/16  . EKG 12-Lead    IMPRESSION AND PLAN:   Anna Prince  is a 83 y.o. female with a known history of peptic ulcer disease, diverticulosis, COPD, coronary artery disease status post stent placement in May 2018 on aspirin and Plavix, essential hypertension and hyperlipidemia is presenting to the ED with a chief complaint of dizziness and diarrhea. Patient has been experiencing intermittent episodes of diarrhea for several months and had endoscopy done for melena and diagnosed with peptic ulcer disease 6 months ago.  #Symptomatic anemia Admitted to telemetry Hemoglobin  dropped from 10 in July 2018 TO-5.6 Stool for occult blood is positive Transfuse 2 units of blood Monitor hemoglobin and hematocrit and transfuse as needed GI consult placed NPO after midnight Protonix  #History of coronary artery disease status post stent placement in May 2018 Currently holding aspirin and Plavix Cardiology consult is placed to Dr. Saunders Revel regarding anticoagulation management   #History of diverticulosis and peptic ulcer disease Patient had EGD done recently and diagnosed with peptic ulcer disease On Protonix GI consult is placed    #acute kidney injury Hydrate with IV fluids Avoid nephrotoxins and monitor renal function closely Hold lisinopril    DVT prophylaxis with SCDs GI prophylaxis with Protonix   All the records are reviewed and case discussed with ED provider. Management plans discussed with the patient, family and they are in agreement.  CODE STATUS: DNR/daughter [name] is the healthcare power  of attorney  TOTAL TIME TAKING CARE OF THIS PATIENT: 45  minutes.   Note: This dictation was prepared with Dragon dictation along with smaller phrase technology. Any transcriptional errors that result from this process are unintentional.  Nicholes Mango M.D on 03/26/2017 at 8:42 PM  Between 7am to 6pm - Pager - [phone]  After 6pm go to [email or web address] - password [email or web address] [facility]  Office  [phone]  CC: Primary care physician; Crecencio Mc, MD

## 2017-03-27 ENCOUNTER — Encounter: Payer: Self-pay | Admitting: Physician Assistant

## 2017-03-27 DIAGNOSIS — I25118 Atherosclerotic heart disease of native coronary artery with other forms of angina pectoris: Secondary | ICD-10-CM

## 2017-03-27 DIAGNOSIS — K922 Gastrointestinal hemorrhage, unspecified: Secondary | ICD-10-CM

## 2017-03-27 DIAGNOSIS — Z955 Presence of coronary angioplasty implant and graft: Secondary | ICD-10-CM

## 2017-03-27 DIAGNOSIS — R197 Diarrhea, unspecified: Secondary | ICD-10-CM

## 2017-03-27 DIAGNOSIS — K921 Melena: Secondary | ICD-10-CM

## 2017-03-27 DIAGNOSIS — D62 Acute posthemorrhagic anemia: Secondary | ICD-10-CM

## 2017-03-27 DIAGNOSIS — E782 Mixed hyperlipidemia: Secondary | ICD-10-CM

## 2017-03-27 DIAGNOSIS — D649 Anemia, unspecified: Secondary | ICD-10-CM

## 2017-03-27 LAB — URINALYSIS, COMPLETE (UACMP) WITH MICROSCOPIC
BACTERIA UA: NONE SEEN
Bilirubin Urine: NEGATIVE
Glucose, UA: NEGATIVE mg/dL
Hgb urine dipstick: NEGATIVE
KETONES UR: NEGATIVE mg/dL
Nitrite: NEGATIVE
PH: 6 (ref 5.0–8.0)
PROTEIN: NEGATIVE mg/dL
Specific Gravity, Urine: 1.008 (ref 1.005–1.030)

## 2017-03-27 LAB — CBC
HCT: 23.1 % — ABNORMAL LOW (ref 35.0–47.0)
HEMOGLOBIN: 7.8 g/dL — AB (ref 12.0–16.0)
MCH: 29 pg (ref 26.0–34.0)
MCHC: 33.8 g/dL (ref 32.0–36.0)
MCV: 85.7 fL (ref 80.0–100.0)
PLATELETS: 198 10*3/uL (ref 150–440)
RBC: 2.69 MIL/uL — AB (ref 3.80–5.20)
RDW: 13.9 % (ref 11.5–14.5)
WBC: 3.9 10*3/uL (ref 3.6–11.0)

## 2017-03-27 LAB — COMPREHENSIVE METABOLIC PANEL
ALBUMIN: 3 g/dL — AB (ref 3.5–5.0)
ALK PHOS: 66 U/L (ref 38–126)
ALT: 16 U/L (ref 14–54)
ANION GAP: 6 (ref 5–15)
AST: 25 U/L (ref 15–41)
BUN: 25 mg/dL — ABNORMAL HIGH (ref 6–20)
CALCIUM: 8.5 mg/dL — AB (ref 8.9–10.3)
CHLORIDE: 110 mmol/L (ref 101–111)
CO2: 23 mmol/L (ref 22–32)
CREATININE: 1.64 mg/dL — AB (ref 0.44–1.00)
GFR calc non Af Amer: 28 mL/min — ABNORMAL LOW (ref >60)
GFR, EST AFRICAN AMERICAN: 32 mL/min — AB (ref >60)
GLUCOSE: 95 mg/dL (ref 65–99)
Potassium: 4.4 mmol/L (ref 3.5–5.1)
SODIUM: 139 mmol/L (ref 135–145)
Total Bilirubin: 0.8 mg/dL (ref 0.3–1.2)
Total Protein: 5.6 g/dL — ABNORMAL LOW (ref 6.5–8.1)

## 2017-03-27 LAB — HEMOGLOBIN AND HEMATOCRIT, BLOOD
HEMATOCRIT: 22.8 % — AB (ref 35.0–47.0)
Hemoglobin: 7.5 g/dL — ABNORMAL LOW (ref 12.0–16.0)

## 2017-03-27 MED ORDER — PANTOPRAZOLE SODIUM 40 MG IV SOLR
40.0000 mg | Freq: Two times a day (BID) | INTRAVENOUS | Status: DC
  Administered 2017-03-27 – 2017-03-29 (×5): 40 mg via INTRAVENOUS
  Filled 2017-03-27 (×6): qty 40

## 2017-03-27 MED ORDER — SODIUM CHLORIDE 0.9% FLUSH
3.0000 mL | Freq: Two times a day (BID) | INTRAVENOUS | Status: DC
  Administered 2017-03-27 – 2017-04-01 (×9): 3 mL via INTRAVENOUS

## 2017-03-27 NOTE — Consult Note (Signed)
**Note Anna-Identified via Obfuscation** Cardiology Consultation:   Patient ID: ARRIEL VICTOR; [id number]; Sep 14, 1931   Admit date: 03/26/2017 Date of Consult: 03/27/2017  Primary Care Provider: Crecencio Mc, MD Primary Cardiologist: [facility]   Patient Profile:   [facility] is a 83 y.o. female with a hx of CAD as detailed below s/p high-risk PCI, AAA, iron deficiency anemia with prior GI bleed requiring 2 units of pRBC, HTN, HLD, and COPD who is being seen today for the evaluation of DAPT management in the setting of GI bleed at the request of Dr. Margaretmary [facility].  History of Present Illness:   Ms. Rising presented to [facility] on 08/24/16 with chest pain. Troponins were positive consistent with NSTEMI. Left heart catheterization revealed multivessel CAD, including heavily calcified high-grade stenosis of the proximal LAD involving a diagonal branch. She was referred to [facility] for CABG evaluation, though the patient declined any surgical intervention. Her hospitalization was complicated by acute on chronic renal insufficiency and significant anemia requiring blood transfusion. After discussing medical therapy versus high-risk PCI, the patient underwent orbital atherectomy and drug-eluting stent placement to the proximal LAD. The heavily diseased first diagonal branch was lost during the intervention, though the patient did not have any chest pain or her hemodynamic stability from the. She subsequently underwent EGD demonstrating a large hiatal hernia with gastric ulcers as well as gastritis. No active bleeding was identified. She had a repeat admission in July, 2018 for GI bleeding, suspected to be upper in etiology. She underwent repeat EGD that showed hiatal hernia and gastric ulcers without evidence of acute bleeding. She required 2 unit of pRBC. Her DAPT was temporarily held, though resumed prior to discharge.   She has continued to have intermittent BRBPR over the past several weeks, which was worse ~ 1 week prior with large  amounts of BRBPR noted with BMs at that time. Over the past week she has continued to note intermittent BRBPR with worsening fatigue and dizziness. Never with chest pain. She has been compliant with her DAPT. Because of her fatigue and dizziness she presented to [facility].   Upon the patient's arrival to [facility] they were found to have stable vitals. EKG not done, CXR not done. Labs showed HGB 5.6 (most recent of 9.1 one month prior). She was given 2 units of pRBC with improvement in HGB to 7.8 this morning and improved fatigue. SCr 1.68--1.64 (baseline ~ 1.3-1.5). Her DAPT was held at time of admission.    Past Medical History:  Diagnosis Date  . AAA (abdominal aortic aneurysm) ([facility])   . Anxiety   . Aortic aneurysm ([facility])   . Arthritis   . Barrett's esophagus 2007  . Bursitis 2012   left hip, improved with periodic steroid injection [facility] , [facility])  . COPD (chronic obstructive pulmonary disease) ([facility])   . Coronary artery disease    a. LHC (08/27/16): 15% ostial LM, 99% pLAD and D1, Ramus & LCx nl, m/dRCA diffuse 80-90%, PDA occluded proximally, fills via L-R collats, EF 50-55%; b. PCI (08/31/16): Severe pLAD dz involving D1, successful orbital atherectomy PCI to pLAD w/ Synergy 3.0 x 20 mm DES, 0% residual stenosis & TIMI 3 flow, occluded ostD1, medically managed given lack of CP  . Depression   . Diastolic dysfunction    a. TTE (08/25/16): Mild LVH with LVEF of 50-55% and grade 1 diastolic dysfunction. Mild MR. Mild right atrial enlargement. Normal RV size and function  . GERD (gastroesophageal reflux disease)    with  Barretts Esophagus  . GI bleeding    a. required pRBC May, July, and December 2018  . Hyperlipidemia   . Hypertension   . Iron deficiency anemia 2012  . Lumbago   . Macular degeneration   . Osteoporosis   . Sigmoid diverticulosis    by colonoscopy  . Stroke [facility])     Past Surgical History:  Procedure Laterality Date  . ABDOMINAL AORTIC ANEURYSM REPAIR  July 2010   [facility]  .  APPENDECTOMY    . BACK SURGERY     X2..1975 arachnoid cyst cervical region(blumquist,gso);1997 lumbosacral tumor,9 hr surgery  . CARDIAC CATHETERIZATION    . CARDIAC CATHETERIZATION    . CORONARY ATHERECTOMY N/A 08/31/2016   Procedure: Coronary Atherectomy;  Surgeon: Anna Bush, MD;  Location: [facility];  Service: Cardiovascular;  Laterality: N/A;  . ESOPHAGOGASTRODUODENOSCOPY (EGD) WITH PROPOFOL N/A 09/02/2016   Procedure: ESOPHAGOGASTRODUODENOSCOPY (EGD) WITH PROPOFOL;  Surgeon: Anna Brace, MD;  Location: [facility];  Service: Gastroenterology;  Laterality: N/A;  . ESOPHAGOGASTRODUODENOSCOPY (EGD) WITH PROPOFOL N/A 11/06/2016   Procedure: ESOPHAGOGASTRODUODENOSCOPY (EGD) WITH PROPOFOL;  Surgeon: Anna Lame, MD;  Location: [facility];  Service: [facility];  Laterality: N/A;  . INTRAVASCULAR ULTRASOUND/IVUS N/A 08/31/2016   Procedure: Intravascular Ultrasound/IVUS;  Surgeon: Anna Bush, MD;  Location: [facility];  Service: Cardiovascular;  Laterality: N/A;  . LEFT HEART CATH AND CORONARY ANGIOGRAPHY N/A 08/27/2016   Procedure: Left Heart Cath and Coronary Angiography;  Surgeon: Anna Spray, MD;  Location: [facility];  Service: Cardiovascular;  Laterality: N/A;     Home Meds: Prior to Admission medications   Medication Sig Start Date [facility] Date Taking? Authorizing Provider  aspirin EC 81 MG EC tablet Take 1 tablet (81 mg total) by mouth daily. 08/28/16  Yes Anna Costa, MD  atorvastatin (LIPITOR) 40 MG tablet Take 1 tablet (40 mg total) by mouth daily at 6 PM. 10/08/16  Yes Anna Mc, MD  clopidogrel (PLAVIX) 75 MG tablet Take 1 tablet (75 mg total) by mouth daily. 10/08/16  Yes Anna Mc, MD  diazepam (VALIUM) 5 MG tablet Take 1 tablet (5 mg total) by mouth 2 (two) times daily as needed. 01/28/17  Yes Anna Mc, MD  ferrous sulfate 325 (65 FE) MG tablet Take 1 tablet (325 mg total) by mouth daily with breakfast. 09/03/16  Yes  Anna Colonel, NP  lisinopril (PRINIVIL,ZESTRIL) 20 MG tablet TAKE 1 TABLET BY MOUTH EVERY DAY 01/11/17  Yes Anna Mc, MD  mirtazapine (REMERON) 45 MG tablet Take 1 tablet (45 mg total) by mouth at bedtime. 10/08/16  Yes Anna Mc, MD  pantoprazole (PROTONIX) 40 MG tablet Take 1 tablet (40 mg total) by mouth 2 (two) times daily. 11/08/16  Yes Henreitta Leber, MD  PARoxetine (PAXIL) 30 MG tablet TAKE 1 TABLET BY MOUTH DAILY 02/07/17  Yes Anna Mc, MD  albuterol (PROVENTIL HFA;VENTOLIN HFA) 108 (90 Base) MCG/ACT inhaler Inhale 2 puffs into the lungs every 6 (six) hours as needed for wheezing or shortness of breath. 11/11/15   Alfred Levins, [place], MD  budesonide-formoterol [facility]) 80-4.5 MCG/ACT inhaler Inhale 2 puffs into the lungs 2 (two) times daily. Patient not taking: Reported on 03/26/2017 10/08/16   Anna Mc, MD  HYDROcodone-acetaminophen (NORCO/VICODIN) 5-325 MG tablet Take 1 tablet by mouth every 6 (six) hours as needed for moderate pain. Maximum 2 daily. 08/24/16   Anna Mc, MD  lisinopril (PRINIVIL,ZESTRIL) 20 MG tablet TAKE 1  TABLET BY MOUTH EVERY DAY Patient not taking: Reported on 03/26/2017 10/27/15   Anna Mc, MD  megestrol (MEGACE) 40 MG tablet TAKE 1 TABLET(40 MG) BY MOUTH DAILY Patient not taking: Reported on 03/26/2017 02/06/17   Anna Mc, MD  metoprolol tartrate (LOPRESSOR) 25 MG tablet Take 0.5 tablets (12.5 mg total) by mouth 2 (two) times daily. Patient not taking: Reported on 03/26/2017 08/27/16   Anna Costa, MD  nitroGLYCERIN (NITROSTAT) 0.4 MG SL tablet Place 1 tablet (0.4 mg total) under the tongue every 5 (five) minutes x 3 doses as needed for chest pain. 09/28/16   Anna Mc, MD  PARoxetine (PAXIL) 30 MG tablet Take 1 tablet (30 mg total) by mouth daily. To be refilled by PCP Patient not taking: Reported on 03/26/2017 10/08/16   Anna Mc, MD    Inpatient Medications: Scheduled Meds: . atorvastatin  40 mg Oral q1800    . ferrous sulfate  325 mg Oral Q breakfast  . megestrol  20 mg Oral Daily  . metoprolol tartrate  25 mg Oral BID  . mirtazapine  45 mg Oral QHS  . mometasone-formoterol  2 puff Inhalation BID  . [START ON 03/30/2017] pantoprazole  40 mg Intravenous Q12H  . PARoxetine  30 mg Oral Daily  . sodium chloride flush  3 mL Intravenous Q12H   Continuous Infusions: . sodium chloride 75 mL/hr at 03/26/17 2225   PRN Meds: albuterol, diazepam, HYDROcodone-acetaminophen, nitroGLYCERIN, ondansetron **OR** ondansetron (ZOFRAN) IV  Allergies:   Allergies  Allergen Reactions  . Morphine Sulfate Other (See Comments)    Reaction:  Hallucinations     Social History:   Social History   Socioeconomic History  . Marital status: Widowed    Spouse name: Not on file  . Number of children: Not on file  . Years of education: Not on file  . Highest education level: Not on file  Social Needs  . Financial resource strain: Not on file  . Food insecurity - worry: Not on file  . Food insecurity - inability: Not on file  . Transportation needs - medical: Not on file  . Transportation needs - non-medical: Not on file  Occupational History  . Not on file  Tobacco Use  . Smoking status: Current Every Day Smoker    Packs/day: 0.50    Types: Cigarettes  . Smokeless tobacco: Never Used  . Tobacco comment: HAS BEEN SMOKING 30+ YEARS,RESUMED TOBACCO USE AFTER AAA REPAIR  Substance and Sexual Activity  . Alcohol use: No  . Drug use: No  . Sexual activity: Not on file  Other Topics Concern  . Not on file  Social History Narrative  . Not on file     Family History:  Family History  Problem Relation Age of Onset  . Coronary artery disease Mother   . Coronary artery disease Father 48  . Heart attack Father   . Cancer Neg Hx        no breast,coon or ovarian ca    ROS:  Review of Systems  Constitutional: Positive for malaise/fatigue. Negative for chills, diaphoresis, fever and weight loss.  HENT:  Negative for congestion.   Eyes: Negative for discharge and redness.  Respiratory: Negative for cough, hemoptysis, sputum production, shortness of breath and wheezing.   Cardiovascular: Negative for chest pain, palpitations, orthopnea, claudication, leg swelling and PND.  Gastrointestinal: Positive for blood in stool and diarrhea. Negative for abdominal pain, constipation, heartburn, melena, nausea and vomiting.  Genitourinary: Negative  for hematuria.  Musculoskeletal: Negative for falls and myalgias.  Skin: Negative for rash.  Neurological: Positive for weakness. Negative for dizziness, tingling, tremors, sensory change, speech change, focal weakness and loss of consciousness.  Endo/Heme/Allergies: Does not bruise/bleed easily.  Psychiatric/Behavioral: Negative for substance abuse. The patient is not nervous/anxious.   All other systems reviewed and are negative.     Physical Exam/Data:   Vitals:   03/27/17 0154 03/27/17 0308 03/27/17 0421 03/27/17 0759  BP: (!) 121/41 (!) 121/58 (!) 148/70 (!) 148/53  Pulse: 69 67 60 63  Resp: 12 18 12    Temp: 98.4 F (36.9 C) 98.1 F (36.7 C)  98.3 F (36.8 C)  TempSrc: Oral Oral Oral Oral  SpO2: 97% 96% 99% 92%  Weight:      Height:        Intake/Output Summary (Last 24 hours) at 03/27/2017 1022 Last data filed at 03/27/2017 0500 Gross per 24 hour  Intake 1290.75 ml  Output 400 ml  Net 890.75 ml   Filed Weights   03/26/17 1640  Weight: 117 lb (53.1 kg)   Body mass index is 19.47 kg/m.   Physical Exam: General: Frail appearing, in no acute distress. Head: Normocephalic, atraumatic, sclera non-icteric, no xanthomas, nares without discharge.  Neck: Negative for carotid bruits. JVD not elevated. Lungs: Clear bilaterally to auscultation without wheezes, rales, or rhonchi. Breathing is unlabored. Heart: RRR with S1 S2. II/VI flow murmur, no rubs, or gallops appreciated. Abdomen: Soft, non-tender, non-distended with normoactive bowel  sounds. No hepatomegaly. No rebound/guarding. No obvious abdominal masses. Msk:  Strength and tone appear normal for age. Extremities: No clubbing or cyanosis. No edema. Distal pedal pulses are 2+ and equal bilaterally. Neuro: Alert and oriented X 3. No facial asymmetry. No focal deficit. Moves all extremities spontaneously. Psych:  Responds to questions appropriately with a normal affect.   EKG:  The EKG was personally reviewed and demonstrates: no EKG done in the ED or at time of admission (not in Epic or the paper chart) Telemetry:  Telemetry was personally reviewed and demonstrates: NSR  Weights: Filed Weights   03/26/17 1640  Weight: 117 lb (53.1 kg)    Relevant CV Studies: TTE 08/25/2016: Study Conclusions  - Left ventricle: Wall thickness was increased in a pattern of mild   LVH. Systolic function was normal. The estimated ejection   fraction was in the range of 50% to 55%. Doppler parameters are   consistent with abnormal left ventricular relaxation (grade 1   diastolic dysfunction). - Aortic valve: Valve area (Vmax): 1.86 cm^2. - Mitral valve: There was mild regurgitation. - Right atrium: The atrium was mildly dilated.  LHC 08/27/2016: Coronary Findings   Diagnostic  Dominance: Right  Left Main  Ost LM lesion 15% stenosed  Ost LM lesion.  Left Anterior Descending  Prox LAD lesion 99% stenosed  Prox LAD lesion.  First Diagonal Branch  Ost 1st Diag lesion 99% stenosed  Ost 1st Diag lesion.  Ramus Intermedius  Vessel is moderate in size.  Right Coronary Artery  Mid RCA to Dist RCA lesion 85% stenosed  Mid RCA to Dist RCA lesion.  Right Posterior Descending Artery  Collaterals  RPDA filled by collaterals from 3rd Mrg.    Ost RPDA to RPDA lesion 100% stenosed  Ost RPDA to RPDA lesion.  Intervention   No interventions have been documented.  Left Heart   Left Ventricle The left ventricular size is normal. There is mild left ventricular systolic dysfunction.  LV [facility]  diastolic pressure is normal. The left ventricular ejection fraction is 50-55% by visual estimate.  Aortic Valve There is no aortic valve stenosis.  Coronary Diagrams   Diagnostic Diagram         Conclusion     Ost LM lesion, 15 %stenosed.  Prox LAD lesion, 99 %stenosed.  Ost 1st Diag lesion, 99 %stenosed.  Mid RCA to Dist RCA lesion, 85 %stenosed.  Ost RPDA to RPDA lesion, 100 %stenosed.  The left ventricular ejection fraction is 50-55% by visual estimate.  There is mild left ventricular systolic dysfunction.  LV [facility] diastolic pressure is normal.  There is no aortic valve stenosis.   Bifurcating proximal lad and d1 with 95-99% proxiimal lad and D1. Heavily calcified left main and proximal lad. 60-70% ostial lcx. Diffusely diseased rca with 100% pd with collaterals from the left.  Preserved LV funciton Tortuous aorta.  Very high risk PCI lesions will need consideration for cabg.    Coronary atherectomy 08/31/2016: Conclusion   Conclusions: 1. Severe proximal LAD disease involving first diagonal branch with heavy calcification. 2. Successful orbital atherectomy and PCI to proximal LAD with placement of a Synergy 3.0 x 20 mm drug-eluting stent (post dilated with 3.5 mm [place] balloon) with 0% residual stenosis and TIMI-3 flow. 3. Moderate-caliber D1 branch became occluded at its ostium during orbital atherectomy. Given lack of chest pain and collateral filling of the branch, decision was made not to intervene on this vessel.  Recommendations: 1. Dual antiplatelet therapy with aspirin and clopidogrel for at least 12 months, if tolerated, ideally longer. 2. Aggressive secondary prevention.     Laboratory Data:  Chemistry Recent Labs  [facility] 03/26/17 1638 03/27/17 0538  NA 138 139  K 4.5 4.4  CL 105 110  CO2 23 23  GLUCOSE 102* 95  BUN 28* 25*  CREATININE 1.68* 1.64*  CALCIUM 8.5* 8.5*  GFRNONAA 27* 28*  GFRAA 31* 32*  ANIONGAP 10 6    Recent Labs    [facility] 03/26/17 1638 03/27/17 0538  PROT 6.1* 5.6*  ALBUMIN 3.3* 3.0*  AST 14* 25  ALT 9* 16  ALKPHOS 68 66  BILITOT 0.1* 0.8   Hematology Recent Labs  [facility] 03/26/17 1638 03/26/17 2126 03/27/17 0538  WBC 4.8  --  3.9  RBC 1.99*  --  2.69*  HGB 5.6* 5.2* 7.8*  HCT 17.3* 16.5* 23.1*  MCV 86.9  --  85.7  MCH 28.3  --  29.0  MCHC 32.5  --  33.8  RDW 13.9  --  13.9  PLT 233  --  198   Cardiac EnzymesNo results for input(s): TROPONINI in the last 168 hours. No results for input(s): TROPIPOC in the last 168 hours.  BNPNo results for input(s): BNP, PROBNP in the last 168 hours.  DDimer No results for input(s): DDIMER in the last 168 hours.  Radiology/Studies:  No results found.  Assessment and Plan:   1. CAD in native coronary artery without angina/DAPT management: -No symptoms concerning for angina -Troponin not checked -DAPT held at time of admission -She has completed > 6 months of DAPT at time of admission -Discussed with Dr. Rockey Situ, ok to hold Plavix, given duration since PCI, for GI procedures -Last dose of Plavix was morning of 12/4 -Resume antiplatelet therapy once deemed stable from an anemia and GI standpoint -Continue Lopressor -Check admission EKG  2. Symptomatic anemia/GI bleed: -Prior admission with melena with suspected upper GI bleed in 10/2016 with EGD showed gastric ulcers without evidence of  acute bleeding. She required 2 units of pRBC at that time -Status post 2 units of pRBC upon admission 12/4 -Continue to monitor periodic HGB/HCT, defer to IM -GI consult pending -Discussed with GI, planning for EGD morning of 12/6 with possible colonoscopy 12/7 if EGD is unrevealing  -IV PPI  3. HLD: -Lipitor  4. AKI: -Likely ATN from acute blood loss anemia -Monitor -Per IM  5. Hypoalbuminemia: -Per IM   For questions or updates, please contact [place] Please consult [email or web address] for contact info under Cardiology/STEMI.   Signed, Christell Faith,  PA-C [place] Pager: [phone] 03/27/2017, 10:22 AM

## 2017-03-27 NOTE — Consult Note (Addendum)
Anna Antigua, MD [address] [place], [place], [place], [address] [address] [place], [place], [place], [address] Phone: [phone]  Fax: [phone]  Consultation  Referring Provider:     Dr. Benjie Karvonen Primary Care Physician:  Crecencio Mc, MD Primary Gastroenterologist:  Virgel Manifold, MD        Reason for Consultation:    GI Bleed  Date of Admission:  03/26/2017 Date of Consultation:  03/27/2017         HPI:   Anna Prince is a 83 y.o. female presents with BRBPR at home that she states has been ongoing for 2-3 months, intermittently every 2 weeks.  Patient also reports mild diffuse abdominal pain intermittently.  Denies any nausea vomiting.  Denies any melena.  Hemoglobin was 5.6 on admission.  Patient takes aspirin daily at home along with Plavix due to drug-eluting stents placed in May 2018.  She was seen by Dr. Allen Norris in July 2018 when she was admitted with abdominal pain and diarrhea and anemia.  EGD at that time showed few nonbleeding gastric ulcers with pigmented material.  Some duodenal erythema was also reported.  No active bleeding was found during the procedure.  Patient was recommended to take PPI,  Which she is on at home.  Prior to this she had an EGD in May 2018 by Dr. Alessandra Bevels, for melena and heme positive stool, which showed a wide open Schatzki ring, gastric erythema, multiple Cameron erosions, 8 cm hiatal hernia.  She had a CT scan in July 2018 that showed inflammation in her ascending colon and cecum.  Per Dr. Dorothey Baseman note this was felt to be similar to a prior CT in 2015.  The CT in 2015, showed inflammation in her left colon.  Subsequent outpt complete colonoscopy in 2016 did not show any evidence of colitis and showed diverticulosis.  Past Medical History:  Diagnosis Date  . AAA (abdominal aortic aneurysm) ([place])   . Anxiety   . Aortic aneurysm ([place])   . Arthritis   . Barrett's esophagus 2007  . Bursitis 2012   left hip, improved with  periodic steroid injection [facility])  . COPD (chronic obstructive pulmonary disease) ([place])   . Coronary artery disease    a. LHC (08/27/16): 15% ostial LM, 99% pLAD and D1, Ramus & LCx nl, m/dRCA diffuse 80-90%, PDA occluded proximally, fills via L-R collats, EF 50-55%; b. PCI (08/31/16): Severe pLAD dz involving D1, successful orbital atherectomy PCI to pLAD w/ Synergy 3.0 x 20 mm DES, 0% residual stenosis & TIMI 3 flow, occluded ostD1, medically managed given lack of CP  . Depression   . Diastolic dysfunction    a. TTE (08/25/16): Mild LVH with LVEF of 50-55% and grade 1 diastolic dysfunction. Mild MR. Mild right atrial enlargement. Normal RV size and function  . GERD (gastroesophageal reflux disease)    with Barretts Esophagus  . GI bleeding    a. required pRBC May, July, and December 2018  . Hyperlipidemia   . Hypertension   . Iron deficiency anemia 2012  . Lumbago   . Macular degeneration   . Osteoporosis   . Sigmoid diverticulosis    by colonoscopy  . Stroke [facility])     Past Surgical History:  Procedure Laterality Date  . ABDOMINAL AORTIC ANEURYSM REPAIR  July 2010   [facility]  . APPENDECTOMY    . BACK SURGERY     X2..1975 arachnoid cyst cervical region([place]);1997 lumbosacral tumor,9 hr surgery  .  CARDIAC CATHETERIZATION    . CARDIAC CATHETERIZATION    . CORONARY ATHERECTOMY N/A 08/31/2016   Procedure: Coronary Atherectomy;  Surgeon: Nelva Bush, MD;  Location: [place] CV LAB;  Service: Cardiovascular;  Laterality: N/A;  . ESOPHAGOGASTRODUODENOSCOPY (EGD) WITH PROPOFOL N/A 09/02/2016   Procedure: ESOPHAGOGASTRODUODENOSCOPY (EGD) WITH PROPOFOL;  Surgeon: Otis Brace, MD;  Location: [place];  Service: Gastroenterology;  Laterality: N/A;  . ESOPHAGOGASTRODUODENOSCOPY (EGD) WITH PROPOFOL N/A 11/06/2016   Procedure: ESOPHAGOGASTRODUODENOSCOPY (EGD) WITH PROPOFOL;  Surgeon: Lucilla Lame, MD;  Location: [facility] ENDOSCOPY;  Service: Endoscopy;  Laterality: N/A;  .  INTRAVASCULAR ULTRASOUND/IVUS N/A 08/31/2016   Procedure: Intravascular Ultrasound/IVUS;  Surgeon: Nelva Bush, MD;  Location: [place] CV LAB;  Service: Cardiovascular;  Laterality: N/A;  . LEFT HEART CATH AND CORONARY ANGIOGRAPHY N/A 08/27/2016   Procedure: Left Heart Cath and Coronary Angiography;  Surgeon: Teodoro Spray, MD;  Location: [place] CV LAB;  Service: Cardiovascular;  Laterality: N/A;    Prior to Admission medications   Medication Sig Start Date End Date Taking? Authorizing Provider  aspirin EC 81 MG EC tablet Take 1 tablet (81 mg total) by mouth daily. 08/28/16  Yes Bettey Costa, MD  atorvastatin (LIPITOR) 40 MG tablet Take 1 tablet (40 mg total) by mouth daily at 6 PM. 10/08/16  Yes Crecencio Mc, MD  clopidogrel (PLAVIX) 75 MG tablet Take 1 tablet (75 mg total) by mouth daily. 10/08/16  Yes Crecencio Mc, MD  diazepam (VALIUM) 5 MG tablet Take 1 tablet (5 mg total) by mouth 2 (two) times daily as needed. 01/28/17  Yes Crecencio Mc, MD  ferrous sulfate 325 (65 FE) MG tablet Take 1 tablet (325 mg total) by mouth daily with breakfast. 09/03/16  Yes Lendon Colonel, NP  lisinopril (PRINIVIL,ZESTRIL) 20 MG tablet TAKE 1 TABLET BY MOUTH EVERY DAY 01/11/17  Yes Crecencio Mc, MD  mirtazapine (REMERON) 45 MG tablet Take 1 tablet (45 mg total) by mouth at bedtime. 10/08/16  Yes Crecencio Mc, MD  pantoprazole (PROTONIX) 40 MG tablet Take 1 tablet (40 mg total) by mouth 2 (two) times daily. 11/08/16  Yes Henreitta Leber, MD  PARoxetine (PAXIL) 30 MG tablet TAKE 1 TABLET BY MOUTH DAILY 02/07/17  Yes Crecencio Mc, MD  albuterol (PROVENTIL HFA;VENTOLIN HFA) 108 (90 Base) MCG/ACT inhaler Inhale 2 puffs into the lungs every 6 (six) hours as needed for wheezing or shortness of breath. 11/11/15   Alfred Levins, [place], MD  budesonide-formoterol [facility]) 80-4.5 MCG/ACT inhaler Inhale 2 puffs into the lungs 2 (two) times daily. Patient not taking: Reported on 03/26/2017 10/08/16    Crecencio Mc, MD  HYDROcodone-acetaminophen (NORCO/VICODIN) 5-325 MG tablet Take 1 tablet by mouth every 6 (six) hours as needed for moderate pain. Maximum 2 daily. 08/24/16   Crecencio Mc, MD  lisinopril (PRINIVIL,ZESTRIL) 20 MG tablet TAKE 1 TABLET BY MOUTH EVERY DAY Patient not taking: Reported on 03/26/2017 10/27/15   Crecencio Mc, MD  megestrol (MEGACE) 40 MG tablet TAKE 1 TABLET(40 MG) BY MOUTH DAILY Patient not taking: Reported on 03/26/2017 02/06/17   Crecencio Mc, MD  metoprolol tartrate (LOPRESSOR) 25 MG tablet Take 0.5 tablets (12.5 mg total) by mouth 2 (two) times daily. Patient not taking: Reported on 03/26/2017 08/27/16   Bettey Costa, MD  nitroGLYCERIN (NITROSTAT) 0.4 MG SL tablet Place 1 tablet (0.4 mg total) under the tongue every 5 (five) minutes x 3 doses as needed for chest pain. 09/28/16   Derrel Nip,  Aris Everts, MD  PARoxetine (PAXIL) 30 MG tablet Take 1 tablet (30 mg total) by mouth daily. To be refilled by PCP Patient not taking: Reported on 03/26/2017 10/08/16   Crecencio Mc, MD    Family History  Problem Relation Age of Onset  . Coronary artery disease Mother   . Coronary artery disease Father 67  . Heart attack Father   . Cancer Neg Hx        no breast,coon or ovarian ca     Social History   Tobacco Use  . Smoking status: Current Every Day Smoker    Packs/day: 0.50    Types: Cigarettes  . Smokeless tobacco: Never Used  . Tobacco comment: HAS BEEN SMOKING 30+ YEARS,RESUMED TOBACCO USE AFTER AAA REPAIR  Substance Use Topics  . Alcohol use: No  . Drug use: No    Allergies as of 03/26/2017 - Review Complete 03/26/2017  Allergen Reaction Noted  . Morphine sulfate Other (See Comments) 12/08/2010    Review of Systems:    All systems reviewed and negative except where noted in HPI.   Physical Exam:  Vital signs in last 24 hours: Vitals:   03/27/17 0154 03/27/17 0308 03/27/17 0421 03/27/17 0759  BP: (!) 121/41 (!) 121/58 (!) 148/70 (!) 148/53  Pulse: 69 67  60 63  Resp: 12 18 12    Temp: 98.4 F (36.9 C) 98.1 F (36.7 C)  98.3 F (36.8 C)  TempSrc: Oral Oral Oral Oral  SpO2: 97% 96% 99% 92%  Weight:      Height:         General:   Pleasant, cooperative in NAD Head:  Normocephalic and atraumatic. Eyes:   No icterus.   Conjunctiva pink. PERRLA. Ears:  Normal auditory acuity. Neck:  Supple; no masses or thyroidomegaly Lungs: Respirations even and unlabored. Lungs clear to auscultation bilaterally.   No wheezes, crackles, or rhonchi.  Heart:  Regular rate and rhythm;  Without murmur, clicks, rubs or gallops Abdomen:  Soft, nondistended, nontender. Normal bowel sounds. No appreciable masses or hepatomegaly.  No rebound or guarding.  Neurologic:  Alert and oriented x3;  grossly normal neurologically. Skin:  Intact without significant lesions or rashes. Cervical Nodes:  No significant cervical adenopathy. Psych:  Alert and cooperative. Normal affect.  LAB RESULTS: Recent Labs    03/26/17 1638 03/26/17 2126 03/27/17 0538  WBC 4.8  --  3.9  HGB 5.6* 5.2* 7.8*  HCT 17.3* 16.5* 23.1*  PLT 233  --  198   BMET Recent Labs    03/26/17 1638 03/27/17 0538  NA 138 139  K 4.5 4.4  CL 105 110  CO2 23 23  GLUCOSE 102* 95  BUN 28* 25*  CREATININE 1.68* 1.64*  CALCIUM 8.5* 8.5*   LFT Recent Labs    03/27/17 0538  PROT 5.6*  ALBUMIN 3.0*  AST 25  ALT 16  ALKPHOS 66  BILITOT 0.8   PT/INR No results for input(s): LABPROT, INR in the last 72 hours.  STUDIES: No results found.    Impression / Plan:   Anna Prince is a 83 y.o. y/o female with bright blood per rectum at home, and acute anemia on presentation with history of gastric ulcers, hiatal hernia, Cameron ulcers in the past, with anticoagulation with Plavix and aspirin due to drug-eluting stent placement in May 2018  Given the degree of anemia, upper GI bleed due to gastric ulcers is high on the differential. Would recommend Protonix 40 IV twice  daily Continue  serial CBCs and transfuse as needed However given the bright red blood per rectum, lower GI bleed is also a possibility.  I have discussed the patient with Christell Faith, from cardiology, who states it would be okay to hold her Plavix for the procedures.  He states she is 6 months past her drug-eluting stent placement, and would document that holding the Plavix is okay.  However, given her comorbidities and high risk cardiac lesions as documented in [place] [facility]'s note, she remains high risk for procedures.  She took a dose of Plavix yesterday, and has only been a little more than 24 hours since her last dose.  Ideally, Plavix would need to be held for 3-5 days for endoscopic procedures requiring therapeutic intervention.  I have discussed the need for endoscopy with the patient and her daughter (on patient's agreement).  Patient is hesitant to undergo a colonoscopy, but is willing to proceed with an EGD.  She will think about her decision and will let us know during the day.  We could proceed with EGD initially, and then if this is negative, revisit the patient to see if she is willing to undergo colonoscopy at that time.  If EGD is negative, can consider an unprepped flex sig at the time of the EGD to evaluate color of stool and any blood in her distal colon. Since her colonoscopy within the last 2 years was negative, unlikely that her GI bleed is from a colonic malignancy since her 2016 colonoscopy did not show any colon malignancy.  RECOMMENDATION: Would recommend primary care team to repeat hemoglobin (Serial CBCs, transfuse PRN), and if this has stabilized, she can be started on a clear liquid diet as EGD will likely be performed in 1-2 days.  Keep n.p.o. past midnight.  Patient has active GI bleeding today or tomorrow, please order bleeding scan, as therapeutic endoscopic procedures will be high risk in the setting of Plavix that was just taken yesterday morning.  Avoid NSAIDs  Continue  Protonix IV twice daily  Thank you for involving me in the care of this patient.      LOS: 1 day   Virgel Manifold, MD  03/27/2017, 11:45 AM

## 2017-03-27 NOTE — Progress Notes (Signed)
[facility] [place] NAME: Arlene Brickel    MR#:  [id number]  DATE OF BIRTH:  [birth year]  SUBJECTIVE:   Patient here with anemia, dizziness  Diarrhea has improved  REVIEW OF SYSTEMS:    Review of Systems  Constitutional: Negative for fever, chills weight loss Does not disease this morning HENT: Negative for ear pain, nosebleeds, congestion, facial swelling, rhinorrhea, neck pain, neck stiffness and ear discharge.   Respiratory: Negative for cough, shortness of breath, wheezing  Cardiovascular: Negative for chest pain, palpitations and leg swelling.  Gastrointestinal: She has had some dark-colored stools as well as blood in her stools at times no abdominal pain and diarrhea is resolving  Genitourinary: Negative for dysuria, urgency, frequency, hematuria Musculoskeletal: Negative for back pain or joint pain Neurological: Negative for dizziness, seizures, syncope, focal weakness,  numbness and headaches.  Hematological: Does not bruise/bleed easily.  Psychiatric/Behavioral: Negative for hallucinations, confusion, dysphoric mood    Tolerating Diet: npo      DRUG ALLERGIES:   Allergies  Allergen Reactions  . Morphine Sulfate Other (See Comments)    Reaction:  Hallucinations     VITALS:  Blood pressure (!) 148/53, pulse 63, temperature 98.3 F (36.8 C), temperature source Oral, resp. rate 12, height 5\' 5"  (1.651 m), weight 53.1 kg (117 lb), SpO2 92 %.  PHYSICAL EXAMINATION:  Constitutional: Appears well-developed and well-nourished. No distress. HENT: Normocephalic. Marland Kitchen Oropharynx is clear and moist.  Eyes: Conjunctivae and EOM are normal. PERRLA, no scleral icterus.  Neck: Normal ROM. Neck supple. No JVD. No tracheal deviation. CVS: RRR, S1/S2 +, no murmurs, no gallops, no carotid bruit.  Pulmonary: Effort and breath sounds normal, no stridor, rhonchi, wheezes, rales.  Abdominal: Soft. BS +,  no distension, tenderness, rebound  or guarding.  Musculoskeletal: Normal range of motion. No edema and no tenderness.  Neuro: Alert. CN 2-12 grossly intact. No focal deficits. Skin: Skin is warm and dry. No rash noted. Psychiatric: Normal mood and affect.      LABORATORY PANEL:   CBC Recent Labs  Lab 03/27/17 0538  WBC 3.9  HGB 7.8*  HCT 23.1*  PLT 198   ------------------------------------------------------------------------------------------------------------------  Chemistries  Recent Labs  Lab 03/27/17 0538  NA 139  K 4.4  CL 110  CO2 23  GLUCOSE 95  BUN 25*  CREATININE 1.64*  CALCIUM 8.5*  AST 25  ALT 16  ALKPHOS 66  BILITOT 0.8   ------------------------------------------------------------------------------------------------------------------  Cardiac Enzymes No results for input(s): TROPONINI in the last 168 hours. ------------------------------------------------------------------------------------------------------------------  RADIOLOGY:  No results found.   ASSESSMENT AND PLAN:   83 year old female with a history of peptic ulcer disease, CAD status post DES in May 2018 on aspirin and Plavix who presents with dizziness and diarrhea and found to have anemia.  1. Acute on chronic symptomatic acute blood loss anemia due to GI bleed Status post 2 units PRBCs GI consultation pending Continue PPI Trend hemoglobin Transfuse if less than 7 hemoglobin Continue ferrous sulfate  2. CAD status post stent May 2 19 Hold aspirin and Plavix for now  [facility] Gastroenterology consultation requested Continue metoprolol, atorvastatin  3. Depression: Continue Paxil 4. Chronic kidney disease stage III: Creatinine stable  Management plans discussed with the patient and she is in agreement.  CODE STATUS: DNR  TOTAL TIME TAKING CARE OF THIS PATIENT: 30 minutes.     POSSIBLE D/C 2-3 days, DEPENDING ON CLINICAL CONDITION.   Shakoya Gilmore M.D on 03/27/2017 at 9:11 AM  Between 7am to 6pm - Pager -  [phone] After 6pm go to [email or web address] - password [email or web address] [facility]  Office  [phone]  CC: Primary care physician; Crecencio Mc, MD  Note: This dictation was prepared with Dragon dictation along with smaller phrase technology. Any transcriptional errors that result from this process are unintentional.

## 2017-03-28 DIAGNOSIS — I2510 Atherosclerotic heart disease of native coronary artery without angina pectoris: Secondary | ICD-10-CM

## 2017-03-28 LAB — TYPE AND SCREEN
ABO/RH(D): O POS
ANTIBODY SCREEN: NEGATIVE
UNIT DIVISION: 0
Unit division: 0

## 2017-03-28 LAB — BPAM RBC
Blood Product Expiration Date: 201812202359
Blood Product Expiration Date: 201812202359
ISSUE DATE / TIME: 201812042217
ISSUE DATE / TIME: 201812050126
UNIT TYPE AND RH: 5100
Unit Type and Rh: 5100

## 2017-03-28 LAB — CBC
HCT: 23.9 % — ABNORMAL LOW (ref 35.0–47.0)
HEMATOCRIT: 25.6 % — AB (ref 35.0–47.0)
HEMATOCRIT: 23.9 % — AB (ref 35.0–47.0)
HEMOGLOBIN: 8.6 g/dL — AB (ref 12.0–16.0)
Hemoglobin: 7.9 g/dL — ABNORMAL LOW (ref 12.0–16.0)
Hemoglobin: 7.9 g/dL — ABNORMAL LOW (ref 12.0–16.0)
MCH: 29 pg (ref 26.0–34.0)
MCH: 28.6 pg (ref 26.0–34.0)
MCH: 28.3 pg (ref 26.0–34.0)
MCHC: 33.5 g/dL (ref 32.0–36.0)
MCHC: 33.2 g/dL (ref 32.0–36.0)
MCHC: 33 g/dL (ref 32.0–36.0)
MCV: 86.5 fL (ref 80.0–100.0)
MCV: 86.2 fL (ref 80.0–100.0)
MCV: 85.7 fL (ref 80.0–100.0)
PLATELETS: 202 10*3/uL (ref 150–440)
Platelets: 206 10*3/uL (ref 150–440)
Platelets: 199 10*3/uL (ref 150–440)
RBC: 2.96 MIL/uL — ABNORMAL LOW (ref 3.80–5.20)
RBC: 2.78 MIL/uL — AB (ref 3.80–5.20)
RBC: 2.78 MIL/uL — ABNORMAL LOW (ref 3.80–5.20)
RDW: 14.1 % (ref 11.5–14.5)
RDW: 14.1 % (ref 11.5–14.5)
RDW: 14 % (ref 11.5–14.5)
WBC: 5 10*3/uL (ref 3.6–11.0)
WBC: 5 10*3/uL (ref 3.6–11.0)
WBC: 4.8 10*3/uL (ref 3.6–11.0)

## 2017-03-28 LAB — PROTIME-INR
INR: 1.01
Prothrombin Time: 13.2 seconds (ref 11.4–15.2)

## 2017-03-28 MED ORDER — LISINOPRIL 20 MG PO TABS
20.0000 mg | ORAL_TABLET | Freq: Every day | ORAL | Status: DC
  Administered 2017-03-28 – 2017-04-01 (×4): 20 mg via ORAL
  Filled 2017-03-28 (×4): qty 1

## 2017-03-28 MED ORDER — SODIUM CHLORIDE 0.9 % IV SOLN: INTRAVENOUS | Status: DC

## 2017-03-28 MED ORDER — SODIUM CHLORIDE 0.9 % IV SOLN
INTRAVENOUS | Status: DC
  Administered 2017-03-29: 08:00:00 via INTRAVENOUS

## 2017-03-28 MED ORDER — NICOTINE 21 MG/24HR TD PT24
21.0000 mg | MEDICATED_PATCH | Freq: Every day | TRANSDERMAL | Status: DC
  Administered 2017-03-28 – 2017-04-01 (×5): 21 mg via TRANSDERMAL
  Filled 2017-03-28 (×5): qty 1

## 2017-03-28 NOTE — Plan of Care (Signed)
Remains confused but more cooperative after medications taken while family in room.

## 2017-03-28 NOTE — Progress Notes (Signed)
Pt impulsive with getting out of bed unassisted.  Confused to whereabouts.  Reports she is leaving to go look for the 81 year old baby.  Difficult to reorient and cursing at staff with attempts to return to bed.Refusing meds.  Daughter [name] notified.  Spoke with patient on phone, pt remains confused.  Daughter to come visit

## 2017-03-28 NOTE — Progress Notes (Signed)
   Anna Antigua, MD [address], [place], [place], [place], [address] [address], [place], [place], [place], [address] Phone: [phone]  Fax: [phone]   Subjective:  Patient resting in bed comfortably, denies any emesis or episodes of melena.  Objective: Vital signs in last 24 hours: Vitals:   03/27/17 0759 03/27/17 1625 03/27/17 2019 03/28/17 0405  BP: (!) 148/53 (!) 141/81 (!) 150/58 (!) 159/55  Pulse: 63 (!) 55 (!) 55 (!) 109  Resp:  14 18 18   Temp: 98.3 F (36.8 C) 98.6 F (37 C) 98.8 F (37.1 C) 98.1 F (36.7 C)  TempSrc: Oral Oral Oral Oral  SpO2: 92% 95% 99% 98%  Weight:      Height:       Weight change:   Intake/Output Summary (Last 24 hours) at 03/28/2017 0751 Last data filed at 03/28/2017 0422 Gross per 24 hour  Intake 720 ml  Output 1200 ml  Net -480 ml     Exam: Cardiac: +S1, +S2, RRR, No edema Pulm: CTA b/l, Normal Resp Effort Abd: Soft, NT/ND, No HSM Skin: Warm, no rashes Neck: Supple, Trachea midline   Lab Results: Hemoglobin of 8.6 today  Micro Results: No results found for this or any previous visit (from the past 240 hour(s)). Studies/Results: No results found. Medications:  Scheduled Meds: . atorvastatin  40 mg Oral q1800  . ferrous sulfate  325 mg Oral Q breakfast  . megestrol  20 mg Oral Daily  . metoprolol tartrate  25 mg Oral BID  . mirtazapine  45 mg Oral QHS  . mometasone-formoterol  2 puff Inhalation BID  . pantoprazole  40 mg Intravenous Q12H  . PARoxetine  30 mg Oral Daily  . sodium chloride flush  3 mL Intravenous Q12H   Continuous Infusions: PRN Meds:.albuterol, diazepam, HYDROcodone-acetaminophen, nitroGLYCERIN, ondansetron **OR** ondansetron (ZOFRAN) IV   Assessment: Active Problems:   GI bleed    Plan: Patient is now 48 hours after her last Plavix dose As per recommendations and guidelines, Plavix should be held for 3-5 days before endoscopic therapeutic procedures. We will plan on EGD  tomorrow I discussed the need for colonoscopy if EGD is negative.  I discussed with her about taking the colonoscopy prep today.  Patient is very hesitant about getting a colonoscopy.  She does not want to take the prep today.  If the EGD is negative she is willing to consider doing a colonoscopy in the future.  I also discussed that she is needing to be off Plavix for her procedures, and if colonoscopy is needed and is delayed, it would require more days off her Plavix.  She verbalized understanding and still would like to proceed with EGD for now. Can Consider unprepped flex sig tomorrow if EGD is negative. Continue serial CBCs and transfuse as needed Avoid NSAIDs Continue Protonix 40 mg IV twice daily  Clear liquid diet okay today N.p.o. past midnight  Please ensure that Protonix orders on GI bleed patient's are entered correctly (the Protonix twice daily order that was in the system yesterday, was not scheduled to start until December 8, and the drip had been discontinued yesterday morning.  I discussed this with the nurse who corrected the previously placed order)   LOS: 2 days   Anna Antigua, MD 03/28/2017, 7:51 AM

## 2017-03-28 NOTE — Progress Notes (Signed)
Progress Note  Patient Name: Anna Prince Date of Encounter: 03/28/2017  Primary Cardiologist: Dr. Saunders Revel  Subjective   No chest pain or shortness of breath.  No further GI bleeding noted by the patient.  Hemoglobin is stable at 8.6 after transfusion.  Inpatient Medications    Scheduled Meds: . atorvastatin  40 mg Oral q1800  . ferrous sulfate  325 mg Oral Q breakfast  . megestrol  20 mg Oral Daily  . metoprolol tartrate  25 mg Oral BID  . mirtazapine  45 mg Oral QHS  . mometasone-formoterol  2 puff Inhalation BID  . pantoprazole  40 mg Intravenous Q12H  . PARoxetine  30 mg Oral Daily  . sodium chloride flush  3 mL Intravenous Q12H   Continuous Infusions:  PRN Meds: albuterol, diazepam, HYDROcodone-acetaminophen, nitroGLYCERIN, ondansetron **OR** ondansetron (ZOFRAN) IV   Vital Signs    Vitals:   03/27/17 1625 03/27/17 2019 03/28/17 0405 03/28/17 0815  BP: (!) 141/81 (!) 150/58 (!) 159/55 (!) 168/60  Pulse: (!) 55 (!) 55 (!) 109 66  Resp: 14 18 18 14   Temp: 98.6 F (37 C) 98.8 F (37.1 C) 98.1 F (36.7 C) 97.9 F (36.6 C)  TempSrc: Oral Oral Oral Oral  SpO2: 95% 99% 98% 96%  Weight:      Height:        Intake/Output Summary (Last 24 hours) at 03/28/2017 1002 Last data filed at 03/28/2017 0422 Gross per 24 hour  Intake 720 ml  Output 1200 ml  Net -480 ml   Filed Weights   03/26/17 1640  Weight: 117 lb (53.1 kg)    Telemetry    Normal sinus rhythm with PACs- Personally Reviewed  ECG     Physical Exam   GEN: No acute distress.   Neck: No JVD Cardiac: RRR, no murmurs, rubs, or gallops.  Respiratory: Clear to auscultation bilaterally. GI: Soft, nontender, non-distended  MS: No edema; No deformity. Neuro:  Nonfocal  Psych: Normal affect   Labs    Chemistry Recent Labs  Lab 03/26/17 1638 03/27/17 0538  NA 138 139  K 4.5 4.4  CL 105 110  CO2 23 23  GLUCOSE 102* 95  BUN 28* 25*  CREATININE 1.68* 1.64*  CALCIUM 8.5* 8.5*  PROT 6.1*  5.6*  ALBUMIN 3.3* 3.0*  AST 14* 25  ALT 9* 16  ALKPHOS 68 66  BILITOT 0.1* 0.8  GFRNONAA 27* 28*  GFRAA 31* 32*  ANIONGAP 10 6     Hematology Recent Labs  Lab 03/26/17 1638  03/27/17 0538 03/27/17 1605 03/28/17 0829  WBC 4.8  --  3.9  --  5.0  RBC 1.99*  --  2.69*  --  2.96*  HGB 5.6*   < > 7.8* 7.5* 8.6*  HCT 17.3*   < > 23.1* 22.8* 25.6*  MCV 86.9  --  85.7  --  86.5  MCH 28.3  --  29.0  --  29.0  MCHC 32.5  --  33.8  --  33.5  RDW 13.9  --  13.9  --  14.1  PLT 233  --  198  --  206   < > = values in this interval not displayed.    Cardiac EnzymesNo results for input(s): TROPONINI in the last 168 hours. No results for input(s): TROPIPOC in the last 168 hours.   BNPNo results for input(s): BNP, PROBNP in the last 168 hours.   DDimer No results for input(s): DDIMER in the last 168  hours.   Radiology    No results found.  Cardiac Studies     Patient Profile     83 y.o. female with known history of coronary artery disease status post high risk atherectomy and drug-eluting stent placement to the proximal LAD in May 2018, prior GI bleed, COPD and AAA.  She presented with GI bleed.  Assessment & Plan    1.  Symptomatic anemia due to GI bleed: Dual antiplatelet therapy is currently on hold .  GI is planning to proceed with EGD tomorrow.  Continue Protonix and supportive care.  2.  Coronary artery disease involving native coronary arteries without angina: Status post high risk PCI to the proximal LAD in May 2018.  RCA was also subtotally occluded with left-to-right collaterals. Dual antiplatelet therapy is currently on hold due to active GI bleed.  However, this patient is at high risk for stent thrombosis given heavy calcifications and overall difficult anatomy. I recommend resuming Plavix 75 mg once daily without aspirin once EGD is performed.  This should be done as soon as feasible.   For questions or updates, please contact [place] Please consult  [email or web address] for contact info under Cardiology/STEMI.      Signed, Kathlyn Sacramento, MD  03/28/2017, 10:02 AM

## 2017-03-28 NOTE — Progress Notes (Signed)
[facility] [place] NAME: Anna Prince    MR#:  [id number]  DATE OF BIRTH:  04-24-1931  SUBJECTIVE:   Patient with a bit of confusion this am  REVIEW OF SYSTEMS:    Review of Systems  Constitutional: Negative for fever, chills weight loss Does not disease this morning HENT: Negative for ear pain, nosebleeds, congestion, facial swelling, rhinorrhea, neck pain, neck stiffness and ear discharge.   Respiratory: Negative for cough, shortness of breath, wheezing  Cardiovascular: Negative for chest pain, palpitations and leg swelling.  Gastrointestinal: She has had some dark-colored stools as well as blood in her stools at times no abdominal pain and diarrhea is resolving  Genitourinary: Negative for dysuria, urgency, frequency, hematuria Musculoskeletal: Negative for back pain or joint pain Neurological: Negative for dizziness, seizures, syncope, focal weakness,  numbness and headaches.  Hematological: Does not bruise/bleed easily.  Psychiatric/Behavioral: Negative for hallucinations, ++confusion, dysphoric mood    Tolerating Diet: yes     DRUG ALLERGIES:   Allergies  Allergen Reactions  . Morphine Sulfate Other (See Comments)    Reaction:  Hallucinations     VITALS:  Blood pressure (!) 168/60, pulse 66, temperature 97.9 F (36.6 C), temperature source Oral, resp. rate 14, height 5\' 5"  (1.651 m), weight 53.1 kg (117 lb), SpO2 96 %.  PHYSICAL EXAMINATION:  Constitutional: Appears well-developed and well-nourished. No distress. HENT: Normocephalic. Marland Kitchen Oropharynx is clear and moist.  Eyes: Conjunctivae and EOM are normal. PERRLA, no scleral icterus.  Neck: Normal ROM. Neck supple. No JVD. No tracheal deviation. CVS: RRR, S1/S2 +, no murmurs, no gallops, no carotid bruit.  Pulmonary: Effort and breath sounds normal, no stridor, rhonchi, wheezes, rales.  Abdominal: Soft. BS +,  no distension, tenderness, rebound or guarding.   Musculoskeletal: Normal range of motion. No edema and no tenderness.  Neuro: Alert. CN 2-12 grossly intact. No focal deficits. Skin: Skin is warm and dry. No rash noted. Psychiatric: Normal mood and affect.      LABORATORY PANEL:   CBC Recent Labs  Lab 03/28/17 0829  WBC 5.0  HGB 8.6*  HCT 25.6*  PLT 206   ------------------------------------------------------------------------------------------------------------------  Chemistries  Recent Labs  Lab 03/27/17 0538  NA 139  K 4.4  CL 110  CO2 23  GLUCOSE 95  BUN 25*  CREATININE 1.64*  CALCIUM 8.5*  AST 25  ALT 16  ALKPHOS 66  BILITOT 0.8   ------------------------------------------------------------------------------------------------------------------  Cardiac Enzymes No results for input(s): TROPONINI in the last 168 hours. ------------------------------------------------------------------------------------------------------------------  RADIOLOGY:  No results found.   ASSESSMENT AND PLAN:   83 year old female with a history of peptic ulcer disease, CAD status post DES in May 2018 on aspirin and Plavix who presents with dizziness and diarrhea and found to have anemia.  1. Acute on chronic symptomatic acute blood loss anemia due to GI bleed Status post 2 units PRBCs GI consultation will perform EGD likely tomorrow since she was on PLAVIX Continue PPI Monitor hemoglobin Transfuse if less than 7 hemoglobin Continue ferrous sulfate  2. CAD status post stent May 2018 Hold aspirin and Plavix for now Once stable from GI bleeding perspective will likely add Plavix alone without aspirin This may take a week or 2 depending on GI findings Continue metoprolol, atorvastatin Appreciate Cardiology evaluation  3. Depression: Continue Paxil 4. Chronic kidney disease stage III: Creatinine stable  5. Confusion: Monitor mental status likelyy has some component of cognitive impairment at baseline exacerbated by  acute  hospital stay.   Management plans discussed with the patient and she is in agreement.  CODE STATUS: DNR  TOTAL TIME TAKING CARE OF THIS PATIENT: 24 minutes.     POSSIBLE D/C 2-3 days, DEPENDING ON CLINICAL CONDITION.   Anna Prince M.D on 03/28/2017 at 9:00 AM  Between 7am to 6pm - Pager - [phone] After 6pm go to [email or web address] - password [email or web address] [facility]  Office  [phone]  CC: Primary care physician; Crecencio Mc, MD  Note: This dictation was prepared with Dragon dictation along with smaller phrase technology. Any transcriptional errors that result from this process are unintentional.

## 2017-03-28 NOTE — Progress Notes (Signed)
Periods of confusion throughout the day.  She wants me to report her rollator walker as missing.  Her walker hasn't been with her during hospitalization.  All attempts to explain this to her have failed.

## 2017-03-29 ENCOUNTER — Encounter
Admission: EM | Disposition: A | Discharge status: Home Health | Payer: Self-pay | Source: Home / Self Care | Attending: Internal Medicine

## 2017-03-29 ENCOUNTER — Inpatient Hospital Stay: Payer: Medicare Other | Admitting: Anesthesiology

## 2017-03-29 ENCOUNTER — Encounter: Payer: Self-pay | Admitting: Anesthesiology

## 2017-03-29 DIAGNOSIS — K921 Melena: Secondary | ICD-10-CM

## 2017-03-29 DIAGNOSIS — K449 Diaphragmatic hernia without obstruction or gangrene: Secondary | ICD-10-CM

## 2017-03-29 DIAGNOSIS — K253 Acute gastric ulcer without hemorrhage or perforation: Secondary | ICD-10-CM

## 2017-03-29 DIAGNOSIS — K5730 Diverticulosis of large intestine without perforation or abscess without bleeding: Secondary | ICD-10-CM

## 2017-03-29 HISTORY — PX: ESOPHAGOGASTRODUODENOSCOPY: SHX5428

## 2017-03-29 HISTORY — PX: FLEXIBLE SIGMOIDOSCOPY: SHX5431

## 2017-03-29 SURGERY — SIGMOIDOSCOPY, FLEXIBLE
Anesthesia: General

## 2017-03-29 SURGERY — EGD (ESOPHAGOGASTRODUODENOSCOPY)
Anesthesia: General

## 2017-03-29 MED ORDER — SODIUM CHLORIDE 0.9 % IV SOLN: INTRAVENOUS | Status: DC

## 2017-03-29 MED ORDER — PROPOFOL 10 MG/ML IV BOLUS
INTRAVENOUS | Status: DC | PRN
  Administered 2017-03-29: 20 mg via INTRAVENOUS
  Administered 2017-03-29: 30 mg via INTRAVENOUS

## 2017-03-29 MED ORDER — PROPOFOL 500 MG/50ML IV EMUL
INTRAVENOUS | Status: DC | PRN
  Administered 2017-03-29: 50 ug/kg/min via INTRAVENOUS

## 2017-03-29 MED ORDER — PEG 3350-KCL-NA BICARB-NACL 420 G PO SOLR
4000.0000 mL | Freq: Once | ORAL | Status: AC
  Administered 2017-03-29: 4000 mL via ORAL
  Filled 2017-03-29: qty 4000

## 2017-03-29 MED ORDER — LIDOCAINE HCL (PF) 2 % IJ SOLN
INTRAMUSCULAR | Status: AC
  Filled 2017-03-29: qty 10

## 2017-03-29 MED ORDER — LIDOCAINE HCL (PF) 2 % IJ SOLN
INTRAMUSCULAR | Status: DC | PRN
  Administered 2017-03-29: 50 mg

## 2017-03-29 NOTE — Op Note (Signed)
[facility] Gastroenterology Patient Name: Anna Prince Procedure Date: 03/29/2017 1:24 PM MRN: [id number] Account #: [id number] Date of Birth: Sep 20, 1931 Admit Type: Inpatient Age: 83 Room: [facility] ENDO ROOM 1 Gender: Female Note Status: Finalized Procedure:            Upper GI endoscopy Indications:          Acute post hemorrhagic anemia, Hematochezia Providers:            Clessie Karras B. Bonna Gains MD, MD Referring MD:         Deborra Medina, MD (Referring MD) Medicines:            Monitored Anesthesia Care Complications:        No immediate complications. Procedure:            Pre-Anesthesia Assessment:                       - The risks and benefits of the procedure and the                        sedation options and risks were discussed with the                        patient. All questions were answered and informed                        consent was obtained.                       - Patient identification and proposed procedure were                        verified prior to the procedure.                       - ASA Grade Assessment: III - A patient with severe                        systemic disease.                       After obtaining informed consent, the endoscope was                        passed under direct vision. Throughout the procedure,                        the patient's blood pressure, pulse, and oxygen                        saturations were monitored continuously. The Endoscope                        was introduced through the mouth, and advanced to the                        second part of duodenum. The upper GI endoscopy was                        accomplished with ease. The patient tolerated the  procedure well. Findings:      The examined esophagus was normal.      Two non-bleeding linear gastric ulcers with no stigmata of bleeding were       found in the gastric antrum. The largest lesion was 3 mm in largest   dimension.      A medium-sized hiatal hernia was present.      The duodenal bulb and second portion of the duodenum were normal. Impression:           - Normal esophagus.                       - Non-bleeding gastric ulcers with no stigmata of                        bleeding.                       - Medium-sized hiatal hernia.                       - Normal duodenal bulb and second portion of the                        duodenum.                       - No specimens collected.                       - No old or new blood present throughout the exam. Recommendation:       - Proceed to Flexible Sigmoidoscopy - See Flexible                        Sigmoidoscopy report for findings and recommendations                       - Return patient to hospital ward for ongoing care.                       - Continue present medications.                       - Use Protonix (pantoprazole) 40 mg PO BID for 4 weeks                        and then daily dosing To be continued as long as she is                        requiring daily Aspirin. If no further aspirin needed,                        Protonix daily can be continued for another 4 weeks and                        then stopped                       - Observe patient's clinical course.                       - Return to my office in 4 weeks.                       -  The findings and recommendations were discussed with                        the patient.                       - Continue Serial CBCs and transfuse PRN Procedure Code(s):    --- Professional ---                       [phone], Esophagogastroduodenoscopy, flexible, transoral;                        diagnostic, including collection of specimen(s) by                        brushing or washing, when performed (separate procedure) Diagnosis Code(s):    --- Professional ---                       K25.9, Gastric ulcer, unspecified as acute or chronic,                        without hemorrhage or  perforation                       K44.9, Diaphragmatic hernia without obstruction or                        gangrene                       D62, Acute posthemorrhagic anemia                       K92.1, Melena (includes Hematochezia) CPT copyright 2016 American Medical Association. All rights reserved. The codes documented in this report are preliminary and upon coder review may  be revised to meet current compliance requirements.  Vonda Antigua, MD Margretta Sidle B. Bonna Gains MD, MD 03/29/2017 2:29:32 PM This report has been signed electronically. Number of Addenda: 0 Note Initiated On: 03/29/2017 1:24 PM Total Procedure Duration: 0 hours 6 minutes 15 seconds       [facility]

## 2017-03-29 NOTE — Progress Notes (Signed)
Progress Note  Patient Name: Anna Prince Date of Encounter: 03/29/2017  Primary Cardiologist: Dr. Saunders Revel  Subjective   No chest pain or shortness of breath.   N.p.o. this morning for EGD Hemoglobin is stable at 8.6 after transfusion. Denies any further GI bleeding  Inpatient Medications    Scheduled Meds: . atorvastatin  40 mg Oral q1800  . ferrous sulfate  325 mg Oral Q breakfast  . lisinopril  20 mg Oral Daily  . megestrol  20 mg Oral Daily  . metoprolol tartrate  25 mg Oral BID  . mirtazapine  45 mg Oral QHS  . mometasone-formoterol  2 puff Inhalation BID  . nicotine  21 mg Transdermal Daily  . pantoprazole  40 mg Intravenous Q12H  . PARoxetine  30 mg Oral Daily  . sodium chloride flush  3 mL Intravenous Q12H   Continuous Infusions: . sodium chloride 20 mL/hr at 03/29/17 0733   PRN Meds: albuterol, diazepam, HYDROcodone-acetaminophen, nitroGLYCERIN, ondansetron **OR** ondansetron (ZOFRAN) IV   Vital Signs    Vitals:   03/28/17 1924 03/29/17 0524 03/29/17 0745 03/29/17 0855  BP: (!) 150/70 (!) 128/53 (!) 146/73   Pulse: 89 (!) 56 (!) 59 67  Resp: 18 18 18    Temp: 98.1 F (36.7 C) 98.7 F (37.1 C) 98.3 F (36.8 C)   TempSrc: Oral Oral Oral   SpO2: 98% 100% 98% 98%  Weight:      Height:        Intake/Output Summary (Last 24 hours) at 03/29/2017 1231 Last data filed at 03/29/2017 1145 Gross per 24 hour  Intake 1200 ml  Output 1550 ml  Net -350 ml   Filed Weights   03/26/17 1640  Weight: 117 lb (53.1 kg)    Telemetry    Normal sinus rhythm with PACs- Personally Reviewed  ECG     Physical Exam   GEN: No acute distress.  Appears pale, thin, frail Neck: No JVD Cardiac: RRR, no murmurs, rubs, or gallops.  Respiratory: Clear to auscultation bilaterally. GI: Soft, nontender, non-distended  MS: No edema; No deformity. Neuro:  Nonfocal  Psych: Normal affect , alert, communicative, oriented   Labs    Chemistry Recent Labs  Lab  03/26/17 1638 03/27/17 0538  NA 138 139  K 4.5 4.4  CL 105 110  CO2 23 23  GLUCOSE 102* 95  BUN 28* 25*  CREATININE 1.68* 1.64*  CALCIUM 8.5* 8.5*  PROT 6.1* 5.6*  ALBUMIN 3.3* 3.0*  AST 14* 25  ALT 9* 16  ALKPHOS 68 66  BILITOT 0.1* 0.8  GFRNONAA 27* 28*  GFRAA 31* 32*  ANIONGAP 10 6     Hematology Recent Labs  Lab 03/28/17 0829 03/28/17 1358 03/28/17 2005  WBC 5.0 5.0 4.8  RBC 2.96* 2.78* 2.78*  HGB 8.6* 7.9* 7.9*  HCT 25.6* 23.9* 23.9*  MCV 86.5 86.2 85.7  MCH 29.0 28.6 28.3  MCHC 33.5 33.2 33.0  RDW 14.1 14.1 14.0  PLT 206 202 199    Cardiac EnzymesNo results for input(s): TROPONINI in the last 168 hours. No results for input(s): TROPIPOC in the last 168 hours.   BNPNo results for input(s): BNP, PROBNP in the last 168 hours.   DDimer No results for input(s): DDIMER in the last 168 hours.   Radiology    No results found.  Cardiac Studies     Patient Profile     83 y.o. female with known history of coronary artery disease status post high risk  atherectomy and drug-eluting stent placement to the proximal LAD in May 2018, prior GI bleed, COPD and AAA.  She presented with GI bleed.  Assessment & Plan    1.  Symptomatic anemia due to GI bleed:  Dual antiplatelet therapy is currently on hold .    EGD scheduled for today  continue Protonix and supportive care. Would recommend restarting Plavix ASAP after EGD We will continue to hold aspirin  2.  Coronary artery disease involving native coronary arteries without angina:  Status post high risk PCI to the proximal LAD in May 2018.   RCA was also subtotally occluded with left-to-right collaterals. high risk for stent thrombosis given heavy calcifications and overall difficult anatomy. Consider resuming Plavix 75 mg once daily without aspirin once EGD is performed.     Total encounter time more than 25 minutes  Greater than 50% was spent in counseling and coordination of care with the patient  For  questions or updates, please contact [place] Please consult [email or web address] for contact info under Cardiology/STEMI.      Signed, Ida Rogue, MD  03/29/2017, 12:31 PM

## 2017-03-29 NOTE — Transfer of Care (Signed)
Immediate Anesthesia Transfer of Care Note  Patient: Anna Prince  Procedure(s) Performed: ESOPHAGOGASTRODUODENOSCOPY (EGD) (N/A ) FLEXIBLE SIGMOIDOSCOPY (N/A )  Patient Location: PACU  Anesthesia Type:General  Level of Consciousness: sedated  Airway & Oxygen Therapy: Patient Spontanous Breathing and Patient connected to nasal cannula oxygen  Post-op Assessment: Report given to RN and Post -op Vital signs reviewed and stable  Post vital signs: Reviewed and stable  Last Vitals:  Vitals:   03/29/17 1304 03/29/17 1353  BP: (!) 157/63 (!) 135/43  Pulse: 91 64  Resp:  (!) 25  Temp: (!) 35.9 C 36.7 C  SpO2: 99% 95%    Last Pain:  Vitals:   03/29/17 1353  TempSrc: Tympanic  PainSc:          Complications: No apparent anesthesia complications

## 2017-03-29 NOTE — Care Management Note (Signed)
Case Management Note  Patient Details  Name: Anna Prince MRN: [id number] Date of Birth: 1931-06-28  Subjective/Objective:                 For EGD today. Hemoglobin stabilizing.  PT consult is pending   Action/Plan:   Expected Discharge Date:                  Expected Discharge Plan:     In-House Referral:     Discharge planning Services     Post Acute Care Choice:    Choice offered to:     DME Arranged:    DME Agency:     HH Arranged:    HH Agency:     Status of Service:     If discussed at [employer], dates discussed:    Additional Comments:  Katrina Stack, RN 03/29/2017, 10:55 AM

## 2017-03-29 NOTE — Anesthesia Postprocedure Evaluation (Signed)
Anesthesia Post Note  Patient: [name]  Procedure(s) Performed: ESOPHAGOGASTRODUODENOSCOPY (EGD) (N/A ) FLEXIBLE SIGMOIDOSCOPY (N/A )  Patient location during evaluation: Endoscopy Anesthesia Type: General Level of consciousness: awake and alert Pain management: pain level controlled Vital Signs Assessment: post-procedure vital signs reviewed and stable Respiratory status: spontaneous breathing, nonlabored ventilation, respiratory function stable and patient connected to nasal cannula oxygen Cardiovascular status: blood pressure returned to baseline and stable Postop Assessment: no apparent nausea or vomiting Anesthetic complications: no     Last Vitals:  Vitals:   03/29/17 1413 03/29/17 1423  BP: (!) 146/62 (!) 150/86  Pulse: 64 67  Resp: (!) 23 16  Temp:    SpO2: 99% 93%    Last Pain:  Vitals:   03/29/17 1353  TempSrc: Tympanic  PainSc:                  Olander Friedl S

## 2017-03-29 NOTE — Anesthesia Preprocedure Evaluation (Addendum)
Anesthesia Evaluation  Patient identified by MRN, date of birth, ID band Patient awake    Reviewed: Allergy & Precautions, NPO status , Patient's Chart, lab work & pertinent test results, reviewed documented beta blocker date and time   Airway Mallampati: II  TM Distance: >3 FB     Dental  (+) Chipped, Upper Dentures, Partial Lower   Pulmonary COPD, Current Smoker,           Cardiovascular hypertension, Pt. on medications and Pt. on home beta blockers + CAD, + Past MI, + Cardiac Stents and + Peripheral Vascular Disease       Neuro/Psych PSYCHIATRIC DISORDERS Anxiety Depression    GI/Hepatic PUD, GERD  ,  Endo/Other    Renal/GU      Musculoskeletal  (+) Arthritis ,   Abdominal   Peds  Hematology  (+) anemia ,   Anesthesia Other Findings Denies CVA. Has PACs.EF 55. Hb 7.9.  Reproductive/Obstetrics                           Anesthesia Physical Anesthesia Plan  ASA: III  Anesthesia Plan: General   Post-op Pain Management:    Induction: Intravenous  PONV Risk Score and Plan:   Airway Management Planned:   Additional Equipment:   Intra-op Plan:   Post-operative Plan:   Informed Consent: I have reviewed the patients History and Physical, chart, labs and discussed the procedure including the risks, benefits and alternatives for the proposed anesthesia with the patient or authorized representative who has indicated his/her understanding and acceptance.     Plan Discussed with: CRNA  Anesthesia Plan Comments:         Anesthesia Quick Evaluation

## 2017-03-29 NOTE — Op Note (Addendum)
[facility] Gastroenterology Patient Name: Anna Prince Procedure Date: 03/29/2017 2:29 PM MRN: [id number] Account #: [id number] Date of Birth: 10/12/1931 Admit Type: Inpatient Age: 83 Room: [facility] ENDO ROOM 1 Gender: Female Note Status: Finalized Procedure:            Flexible Sigmoidoscopy Indications:          Hematochezia Providers:            Nazanin Kinner B. Bonna Gains MD, MD Referring MD:         Deborra Medina, MD (Referring MD) Medicines:            Monitored Anesthesia Care Complications:        No immediate complications. Procedure:            Pre-Anesthesia Assessment:                       - The risks and benefits of the procedure and the                        sedation options and risks were discussed with the                        patient. All questions were answered and informed                        consent was obtained.                       - Patient identification and proposed procedure were                        verified prior to the procedure.                       - ASA Grade Assessment: III - A patient with severe                        systemic disease.                       After obtaining informed consent, the scope was passed                        under direct vision.The flexible sigmoidoscopy was                        accomplished with ease. The patient tolerated the                        procedure well. The quality of the bowel preparation                        was poor. The Endoscope was introduced through the anus                        and advanced to the the sigmoid colon. Findings:      The digital rectal exam was normal.      Multiple diverticula were found in the sigmoid colon.      A large amount of solid stool was found in the sigmoid colon. Stool was  yellow in color. No old or recent blood present. Impression:           - Preparation of the colon was poor.                       - Diverticulosis in the sigmoid colon.  GI bleeding                        could have been due to diverticulosis, however,                        proximal colon need to be evaluated to rule out any                        proximal lesions.                       - Stool yellown stool in the sigmoid colon. Presense of                        yellow stool and absence of melena or red blood                        suggests that active bleeding has resolved at this time.                       - No specimens collected. Recommendation:       - Patient needs a full colonoscopy with colonoscopy                        prep since she needs to restart her Plavix and any GI                        lesions need to be ruled out. Pt and family are                        agreeable to this. Risks of bleeding, infection,                        perforation with procedures were discussed with them in                        detail and they are agreeable with the procedure. Will                        start colonoscopy prep today.                       - If Colonoscopy is negative, next step would be small                        bowel capsule. Dr. Marius Ditch would be doing the colonoscopy                        tomorrow and then small bowel capsule subsequently                       - See EGD report from today for findings and  recommendations about her PPI                       - Continue present medications.                       - Clear liquid diet.                       - Return patient to hospital ward for ongoing care. Procedure Code(s):    --- Professional ---                       [phone], Sigmoidoscopy, flexible; diagnostic, including                        collection of specimen(s) by brushing or washing, when                        performed (separate procedure) Diagnosis Code(s):    --- Professional ---                       K92.1, Melena (includes Hematochezia)                       K57.30, Diverticulosis of large intestine  without                        perforation or abscess without bleeding CPT copyright 2016 American Medical Association. All rights reserved. The codes documented in this report are preliminary and upon coder review may  be revised to meet current compliance requirements.  Vonda Antigua, MD Margretta Sidle B. Bonna Gains MD, MD 03/29/2017 3:13:59 PM This report has been signed electronically. Number of Addenda: 0 Note Initiated On: 03/29/2017 2:29 PM      [facility]

## 2017-03-29 NOTE — H&P (Signed)
Jonathon Bellows, MD [address] [place], [name], [place], [address] [address], [place], [place], [place], [address] Phone: [phone]  Fax: [phone]  Primary Care Physician:  Crecencio Mc, MD   Pre-Procedure History & Physical: HPI:  Anna Prince is a 83 y.o. female is here for an EGD and Flexible Sigmoidoscopy.   Past Medical History:  Diagnosis Date  . AAA (abdominal aortic aneurysm) ([place])   . Anxiety   . Aortic aneurysm ([place])   . Arthritis   . Barrett's esophagus 2007  . Bursitis 2012   left hip, improved with periodic steroid injection [facility])  . COPD (chronic obstructive pulmonary disease) ([place])   . Coronary artery disease    a. LHC (08/27/16): 15% ostial LM, 99% pLAD and D1, Ramus & LCx nl, m/dRCA diffuse 80-90%, PDA occluded proximally, fills via L-R collats, EF 50-55%; b. PCI (08/31/16): Severe pLAD dz involving D1, successful orbital atherectomy PCI to pLAD w/ Synergy 3.0 x 20 mm DES, 0% residual stenosis & TIMI 3 flow, occluded ostD1, medically managed given lack of CP  . Depression   . Diastolic dysfunction    a. TTE (08/25/16): Mild LVH with LVEF of 50-55% and grade 1 diastolic dysfunction. Mild MR. Mild right atrial enlargement. Normal RV size and function  . GERD (gastroesophageal reflux disease)    with Barretts Esophagus  . GI bleeding    a. required pRBC May, July, and December 2018  . Hyperlipidemia   . Hypertension   . Iron deficiency anemia 2012  . Lumbago   . Macular degeneration   . Osteoporosis   . Sigmoid diverticulosis    by colonoscopy  . Stroke [facility])     Past Surgical History:  Procedure Laterality Date  . ABDOMINAL AORTIC ANEURYSM REPAIR  July 2010   [facility]  . APPENDECTOMY    . BACK SURGERY     X2..1975 arachnoid cyst cervical region([place]);1997 lumbosacral tumor,9 hr surgery  . CARDIAC CATHETERIZATION    . CARDIAC CATHETERIZATION    . cardiac stents    . CORONARY ATHERECTOMY N/A 08/31/2016   Procedure:  Coronary Atherectomy;  Surgeon: Nelva Bush, MD;  Location: [place] [facility];  Service: Cardiovascular;  Laterality: N/A;  . ESOPHAGOGASTRODUODENOSCOPY (EGD) WITH PROPOFOL N/A 09/02/2016   Procedure: ESOPHAGOGASTRODUODENOSCOPY (EGD) WITH PROPOFOL;  Surgeon: Otis Brace, MD;  Location: [place];  Service: Gastroenterology;  Laterality: N/A;  . ESOPHAGOGASTRODUODENOSCOPY (EGD) WITH PROPOFOL N/A 11/06/2016   Procedure: ESOPHAGOGASTRODUODENOSCOPY (EGD) WITH PROPOFOL;  Surgeon: Lucilla Lame, MD;  Location: [facility] ENDOS[facility];  Service: Endoscopy;  Laterality: N/A;  . INTRAVASCULAR ULTRASOUND/IVUS N/A 08/31/2016   Procedure: Intravascular Ultrasound/IVUS;  Surgeon: Nelva Bush, MD;  Location: [place] [facility];  Service: Cardiovascular;  Laterality: N/A;  . LEFT HEART CATH AND CORONARY ANGIOGRAPHY N/A 08/27/2016   Procedure: Left Heart Cath and Coronary Angiography;  Surgeon: Teodoro Spray, MD;  Location: [place] [facility];  Service: Cardiovascular;  Laterality: N/A;    Prior to Admission medications   Medication Sig Start Date End Date Taking? Authorizing Provider  aspirin EC 81 MG EC tablet Take 1 tablet (81 mg total) by mouth daily. 08/28/16  Yes Bettey Costa, MD  atorvastatin (LIPITOR) 40 MG tablet Take 1 tablet (40 mg total) by mouth daily at 6 PM. 10/08/16  Yes Crecencio Mc, MD  clopidogrel (PLAVIX) 75 MG tablet Take 1 tablet (75 mg total) by mouth daily. 10/08/16  Yes Crecencio Mc, MD  diazepam (VALIUM) 5  MG tablet Take 1 tablet (5 mg total) by mouth 2 (two) times daily as needed. 01/28/17  Yes Crecencio Mc, MD  ferrous sulfate 325 (65 FE) MG tablet Take 1 tablet (325 mg total) by mouth daily with breakfast. 09/03/16  Yes Lendon Colonel, NP  lisinopril (PRINIVIL,ZESTRIL) 20 MG tablet TAKE 1 TABLET BY MOUTH EVERY DAY 01/11/17  Yes Crecencio Mc, MD  mirtazapine (REMERON) 45 MG tablet Take 1 tablet (45 mg total) by mouth at bedtime. 10/08/16  Yes Crecencio Mc, MD    nitroGLYCERIN (NITROSTAT) 0.4 MG SL tablet Place 1 tablet (0.4 mg total) under the tongue every 5 (five) minutes x 3 doses as needed for chest pain. 09/28/16  Yes Crecencio Mc, MD  pantoprazole (PROTONIX) 40 MG tablet Take 1 tablet (40 mg total) by mouth 2 (two) times daily. 11/08/16  Yes Henreitta Leber, MD  PARoxetine (PAXIL) 30 MG tablet TAKE 1 TABLET BY MOUTH DAILY 02/07/17  Yes Crecencio Mc, MD  albuterol (PROVENTIL HFA;VENTOLIN HFA) 108 (90 Base) MCG/ACT inhaler Inhale 2 puffs into the lungs every 6 (six) hours as needed for wheezing or shortness of breath. 11/11/15   Alfred Levins, [place], MD  budesonide-formoterol [facility]) 80-4.5 MCG/ACT inhaler Inhale 2 puffs into the lungs 2 (two) times daily. Patient not taking: Reported on 03/26/2017 10/08/16   Crecencio Mc, MD  HYDROcodone-acetaminophen (NORCO/VICODIN) 5-325 MG tablet Take 1 tablet by mouth every 6 (six) hours as needed for moderate pain. Maximum 2 daily. 08/24/16   Crecencio Mc, MD  lisinopril (PRINIVIL,ZESTRIL) 20 MG tablet TAKE 1 TABLET BY MOUTH EVERY DAY Patient not taking: Reported on 03/26/2017 10/27/15   Crecencio Mc, MD  megestrol (MEGACE) 40 MG tablet TAKE 1 TABLET(40 MG) BY MOUTH DAILY Patient not taking: Reported on 03/26/2017 02/06/17   Crecencio Mc, MD  metoprolol tartrate (LOPRESSOR) 25 MG tablet Take 0.5 tablets (12.5 mg total) by mouth 2 (two) times daily. Patient not taking: Reported on 03/26/2017 08/27/16   Bettey Costa, MD  PARoxetine (PAXIL) 30 MG tablet Take 1 tablet (30 mg total) by mouth daily. To be refilled by PCP Patient not taking: Reported on 03/26/2017 10/08/16   Crecencio Mc, MD    Allergies as of 03/26/2017 - Review Complete 03/26/2017  Allergen Reaction Noted  . Morphine sulfate Other (See Comments) 12/08/2010    Family History  Problem Relation Age of Onset  . Coronary artery disease Mother   . Coronary artery disease Father 37  . Heart attack Father   . Cancer Neg Hx        no  breast,coon or ovarian ca    Social History   Socioeconomic History  . Marital status: Widowed    Spouse name: Not on file  . Number of children: Not on file  . Years of education: Not on file  . Highest education level: Not on file  Social Needs  . Financial resource strain: Not on file  . Food insecurity - worry: Not on file  . Food insecurity - inability: Not on file  . Transportation needs - medical: Not on file  . Transportation needs - non-medical: Not on file  Occupational History  . Not on file  Tobacco Use  . Smoking status: Current Every Day Smoker    Packs/day: 0.50    Types: Cigarettes  . Smokeless tobacco: Never Used  . Tobacco comment: HAS BEEN SMOKING 30+ YEARS,RESUMED TOBACCO USE AFTER AAA REPAIR  Substance and Sexual  Activity  . Alcohol use: No  . Drug use: No  . Sexual activity: Not on file  Other Topics Concern  . Not on file  Social History Narrative  . Not on file    Review of Systems: See HPI, otherwise negative ROS  Physical Exam: BP (!) 157/63   Pulse 91   Temp (!) 96.7 F (35.9 C) (Tympanic)   Resp 18   Ht 5\' 5"  (1.651 m)   Wt 117 lb (53.1 kg)   SpO2 99%   BMI 19.47 kg/m  General:   Alert,  pleasant and cooperative in NAD Head:  Normocephalic and atraumatic. Neck:  Supple; no masses or thyromegaly. Lungs:  Clear throughout to auscultation, normal respiratory effort.    Heart:  +S1, +S2, Regular rate and rhythm, No edema. Abdomen:  Soft, nontender and nondistended. Normal bowel sounds, without guarding, and without rebound.   Neurologic:  Alert and  oriented x4;  grossly normal neurologically.  Impression/Plan: Anna Prince is here for an EGD and Flexible sigmoidoscopy  Risks, benefits, limitations, and alternatives regarding  Procedures have been reviewed with the patient.  Questions have been answered.  All parties agreeable.   Virgel Manifold, MD  03/29/2017, 1:22 PM

## 2017-03-29 NOTE — Progress Notes (Signed)
Anna Prince at [place] NAME: Anna Prince    MR#:  [id number]  DATE OF BIRTH:  Jan 08, 1932  SUBJECTIVE:   Plan for EGD this afternoon. No dark-colored stools  REVIEW OF SYSTEMS:    Review of Systems  Constitutional: Negative for fever, chills weight loss Does not disease this morning HENT: Negative for ear pain, nosebleeds, congestion, facial swelling, rhinorrhea, neck pain, neck stiffness and ear discharge.   Respiratory: Negative for cough, shortness of breath, wheezing  Cardiovascular: Negative for chest pain, palpitations and leg swelling.  Gastrointestinal: She has had some dark-colored stools as well as blood in her stools at times no abdominal pain and diarrhea is resolving  Genitourinary: Negative for dysuria, urgency, frequency, hematuria Musculoskeletal: Negative for back pain or joint pain Neurological: Negative for dizziness, seizures, syncope, focal weakness,  numbness and headaches.  Hematological: Does not bruise/bleed easily.  Psychiatric/Behavioral: Negative for hallucinations, ++confusion, dysphoric mood    Tolerating Diet: Nothing by mouth     DRUG ALLERGIES:   Allergies  Allergen Reactions  . Morphine Sulfate Other (See Comments)    Reaction:  Hallucinations     VITALS:  Blood pressure (!) 146/73, pulse 67, temperature 98.3 F (36.8 C), temperature source Oral, resp. rate 18, height 5\' 5"  (1.651 m), weight 53.1 kg (117 lb), SpO2 98 %.  PHYSICAL EXAMINATION:  Constitutional: Appears well-developed and well-nourished. No distress. HENT: Normocephalic. Marland Kitchen Oropharynx is clear and moist.  Eyes: Conjunctivae and EOM are normal. PERRLA, no scleral icterus.  Neck: Normal ROM. Neck supple. No JVD. No tracheal deviation. CVS: RRR, S1/S2 +, no murmurs, no gallops, no carotid bruit.  Pulmonary: Effort and breath sounds normal, no stridor, rhonchi, wheezes, rales.  Abdominal: Soft. BS +,  no distension, tenderness,  rebound or guarding.  Musculoskeletal: Normal range of motion. No edema and no tenderness.  Neuro: Alert. CN 2-12 grossly intact. No focal deficits. Skin: Skin is warm and dry. No rash noted. Psychiatric: Some mild confusion noted    LABORATORY PANEL:   CBC Recent Labs  Lab 03/28/17 2005  WBC 4.8  HGB 7.9*  HCT 23.9*  PLT 199   ------------------------------------------------------------------------------------------------------------------  Chemistries  Recent Labs  Lab 03/27/17 0538  NA 139  K 4.4  CL 110  CO2 23  GLUCOSE 95  BUN 25*  CREATININE 1.64*  CALCIUM 8.5*  AST 25  ALT 16  ALKPHOS 66  BILITOT 0.8   ------------------------------------------------------------------------------------------------------------------  Cardiac Enzymes No results for input(s): TROPONINI in the last 168 hours. ------------------------------------------------------------------------------------------------------------------  RADIOLOGY:  No results found.   ASSESSMENT AND PLAN:   83 year old female with a history of peptic ulcer disease, CAD status post DES in May 2018 on aspirin and Plavix who presents with dizziness and diarrhea and found to have anemia.  1. Acute on chronic symptomatic acute blood loss anemia due to GI bleed Status post 2 units PRBCs EGD planned for today Continue PPI Monitor hemoglobin, hemoglobin stable at 7.9 Transfuse if less than 7 hemoglobin Continue ferrous sulfate  2. CAD status post stent May 2018 Hold aspirin and Plavix for now Once stable from GI bleeding perspective will likely need to continue Plavix alone without aspirin  Continue metoprolol, atorvastatin Appreciate Cardiology evaluation  3. Depression: Continue Paxil 4. Chronic kidney disease stage III: Creatinine stable  5. Confusion: Monitor mental status likelyy has some component of cognitive impairment at baseline exacerbated by acute hospital stay.   Management plans  discussed with the patient and she  is in agreement.  CODE STATUS: DNR  TOTAL TIME TAKING CARE OF THIS PATIENT: 24 minutes.   Physical therapy evaluation for discharge planning.  POSSIBLE D/C tomorrow, DEPENDING ON CLINICAL CONDITION.   Anna Prince M.D on 03/29/2017 at 9:44 AM  Between 7am to 6pm - Pager - [phone] After 6pm go to [email or web address] - password [email or web address] [facility]  Office  [phone]  CC: Primary care physician; Anna Mc, MD  Note: This dictation was prepared with Dragon dictation along with smaller phrase technology. Any transcriptional errors that result from this process are unintentional.

## 2017-03-29 NOTE — Plan of Care (Signed)
No stools noted this shift.   

## 2017-03-29 NOTE — Anesthesia Post-op Follow-up Note (Signed)
Anesthesia QCDR form completed.        

## 2017-03-29 NOTE — Plan of Care (Signed)
  Progressing Education: Knowledge of General Education information will improve 03/29/2017 1056 - Progressing by Rolley Sims, RN Health Behavior/Discharge Planning: Ability to manage health-related needs will improve 03/29/2017 1056 - Progressing by Rolley Sims, RN Clinical Measurements: Ability to maintain clinical measurements within normal limits will improve 03/29/2017 1056 - Progressing by Rolley Sims, RN Diagnostic test results will improve 03/29/2017 1056 - Progressing by Rolley Sims, RN Note Pt in room resting comfortably, awaiting EGD scheduled for 1:30 p.m. today

## 2017-03-29 NOTE — Care Management Important Message (Signed)
Important Message  Patient Details  Name: Anna Prince MRN: [id number] Date of Birth: 15-Aug-1931   Medicare Important Message Given:  Yes  Signed IM notice given   Katrina Stack, RN 03/29/2017, 9:59 AM

## 2017-03-29 NOTE — Progress Notes (Signed)
PT Cancellation Note:   Session not completed.   Consult received and chart reviewed.  Patient currently off unit for diagnostic testing.  Will re-attempt at later time/date as patient medically appropriate and available.  Josph Norfleet H. Owens Shark, PT, DPT, NCS 03/29/17, 2:39 PM [phone]

## 2017-03-30 ENCOUNTER — Inpatient Hospital Stay: Payer: Medicare Other | Admitting: Certified Registered Nurse Anesthetist

## 2017-03-30 ENCOUNTER — Encounter: Payer: Self-pay | Admitting: Anesthesiology

## 2017-03-30 ENCOUNTER — Encounter
Admission: EM | Disposition: A | Discharge status: Home Health | Payer: Self-pay | Source: Home / Self Care | Attending: Internal Medicine

## 2017-03-30 DIAGNOSIS — Q2733 Arteriovenous malformation of digestive system vessel: Secondary | ICD-10-CM

## 2017-03-30 HISTORY — PX: COLONOSCOPY: SHX5424

## 2017-03-30 LAB — CBC
HEMATOCRIT: 29.3 % — AB (ref 35.0–47.0)
Hemoglobin: 9.6 g/dL — ABNORMAL LOW (ref 12.0–16.0)
MCH: 28.2 pg (ref 26.0–34.0)
MCHC: 32.7 g/dL (ref 32.0–36.0)
MCV: 86.1 fL (ref 80.0–100.0)
Platelets: 224 10*3/uL (ref 150–440)
RBC: 3.4 MIL/uL — ABNORMAL LOW (ref 3.80–5.20)
RDW: 14.6 % — AB (ref 11.5–14.5)
WBC: 5 10*3/uL (ref 3.6–11.0)

## 2017-03-30 LAB — IRON AND TIBC
Iron: 14 ug/dL — ABNORMAL LOW (ref 28–170)
SATURATION RATIOS: 5 % — AB (ref 10.4–31.8)
TIBC: 306 ug/dL (ref 250–450)
UIBC: 292 ug/dL

## 2017-03-30 LAB — BASIC METABOLIC PANEL
Anion gap: 10 (ref 5–15)
BUN: 17 mg/dL (ref 6–20)
CALCIUM: 9.1 mg/dL (ref 8.9–10.3)
CO2: 26 mmol/L (ref 22–32)
CREATININE: 1.43 mg/dL — AB (ref 0.44–1.00)
Chloride: 106 mmol/L (ref 101–111)
GFR calc Af Amer: 38 mL/min — ABNORMAL LOW (ref >60)
GFR calc non Af Amer: 33 mL/min — ABNORMAL LOW (ref >60)
GLUCOSE: 96 mg/dL (ref 65–99)
Potassium: 3.8 mmol/L (ref 3.5–5.1)
Sodium: 142 mmol/L (ref 135–145)

## 2017-03-30 LAB — FOLATE: Folate: 17.1 ng/mL (ref >5.9)

## 2017-03-30 LAB — FERRITIN: Ferritin: 27 ng/mL (ref 11–307)

## 2017-03-30 SURGERY — COLONOSCOPY
Anesthesia: General

## 2017-03-30 MED ORDER — LIDOCAINE HCL (PF) 2 % IJ SOLN
INTRAMUSCULAR | Status: AC
  Filled 2017-03-30: qty 10

## 2017-03-30 MED ORDER — CLOPIDOGREL BISULFATE 75 MG PO TABS
75.0000 mg | ORAL_TABLET | Freq: Every day | ORAL | Status: DC
  Administered 2017-03-30 – 2017-03-31 (×2): 75 mg via ORAL
  Filled 2017-03-30 (×5): qty 1

## 2017-03-30 MED ORDER — PANTOPRAZOLE SODIUM 40 MG PO TBEC
40.0000 mg | DELAYED_RELEASE_TABLET | Freq: Two times a day (BID) | ORAL | Status: DC
  Administered 2017-03-30 – 2017-04-01 (×3): 40 mg via ORAL
  Filled 2017-03-30 (×4): qty 1

## 2017-03-30 MED ORDER — LIDOCAINE HCL (CARDIAC) 20 MG/ML IV SOLN
INTRAVENOUS | Status: DC | PRN
  Administered 2017-03-30: 50 mg via INTRAVENOUS

## 2017-03-30 MED ORDER — ACETAMINOPHEN 325 MG PO TABS
650.0000 mg | ORAL_TABLET | Freq: Four times a day (QID) | ORAL | Status: DC | PRN
  Administered 2017-03-30: 650 mg via ORAL
  Filled 2017-03-30: qty 2

## 2017-03-30 MED ORDER — SODIUM CHLORIDE 0.9 % IV SOLN
INTRAVENOUS | Status: DC
  Administered 2017-03-30: 1000 mL via INTRAVENOUS

## 2017-03-30 MED ORDER — PROPOFOL 500 MG/50ML IV EMUL
INTRAVENOUS | Status: AC
  Filled 2017-03-30: qty 50

## 2017-03-30 MED ORDER — PROPOFOL 10 MG/ML IV BOLUS
INTRAVENOUS | Status: AC
  Filled 2017-03-30: qty 20

## 2017-03-30 MED ORDER — EPHEDRINE SULFATE 50 MG/ML IJ SOLN
INTRAMUSCULAR | Status: DC | PRN
  Administered 2017-03-30: 10 mg via INTRAVENOUS

## 2017-03-30 MED ORDER — PROPOFOL 10 MG/ML IV BOLUS
INTRAVENOUS | Status: DC | PRN
Start: 2017-03-30 — End: 2017-03-30
  Administered 2017-03-30: 20 mg via INTRAVENOUS

## 2017-03-30 MED ORDER — PROPOFOL 500 MG/50ML IV EMUL
INTRAVENOUS | Status: DC | PRN
  Administered 2017-03-30: 160 ug/kg/min via INTRAVENOUS

## 2017-03-30 NOTE — Anesthesia Post-op Follow-up Note (Signed)
Anesthesia QCDR form completed.        

## 2017-03-30 NOTE — Progress Notes (Signed)
Talked to Dr. Posey Pronto about patient's complaints of headache and is only requesting for tylenol, PRN order given. Also asked if patient is still going for colonoscopy, per MD she will call GI and will update RN once she talk to GI. No other concern at the moment. RN will continue to monitor.

## 2017-03-30 NOTE — Anesthesia Procedure Notes (Signed)
Date/Time: 03/30/2017 8:30 AM Performed by: Johnna Acosta, CRNA Pre-anesthesia Checklist: Patient identified, Emergency Drugs available, Suction available, Patient being monitored and Timeout performed Patient Re-evaluated:Patient Re-evaluated prior to induction Oxygen Delivery Method: Nasal cannula

## 2017-03-30 NOTE — Op Note (Signed)
[facility] Gastroenterology Patient Name: Anna Prince Procedure Date: 03/30/2017 3:07 PM MRN: [id number] Account #: [id number] Date of Birth: 01-06-1932 Admit Type: Inpatient Age: 83 Room: [facility] ENDO ROOM 4 Gender: Female Note Status: Finalized Procedure:            Colonoscopy Indications:          Melena Providers:            Lin Landsman MD, MD Referring MD:         No Local Md, MD (Referring MD) Medicines:            Monitored Anesthesia Care Complications:        No immediate complications. Estimated blood loss: None. Procedure:            Pre-Anesthesia Assessment:                       - Prior to the procedure, a History and Physical was                        performed, and patient medications and allergies were                        reviewed. The patient is competent. The risks and                        benefits of the procedure and the sedation options and                        risks were discussed with the patient. All questions                        were answered and informed consent was obtained.                        Patient identification and proposed procedure were                        verified by the physician, the nurse, the                        anesthesiologist, the anesthetist and the technician in                        the pre-procedure area in the procedure room. Mental                        Status Examination: alert and oriented. Airway                        Examination: normal oropharyngeal airway and neck                        mobility. Respiratory Examination: clear to                        auscultation. CV Examination: normal. Prophylactic                        Antibiotics: The patient does not require prophylactic  antibiotics. Prior Anticoagulants: The patient has                        taken Plavix (clopidogrel), last dose was 3 days prior                        to procedure. ASA Grade  Assessment: III - A patient                        with severe systemic disease. After reviewing the risks                        and benefits, the patient was deemed in satisfactory                        condition to undergo the procedure. The anesthesia plan                        was to use monitored anesthesia care (MAC). Immediately                        prior to administration of medications, the patient was                        re-assessed for adequacy to receive sedatives. The                        heart rate, respiratory rate, oxygen saturations, blood                        pressure, adequacy of pulmonary ventilation, and                        response to care were monitored throughout the                        procedure. The physical status of the patient was                        re-assessed after the procedure.                       After obtaining informed consent, the colonoscope was                        passed under direct vision. Throughout the procedure,                        the patient's blood pressure, pulse, and oxygen                        saturations were monitored continuously. The                        Colonoscope was introduced through the anus and                        advanced to the the cecum, identified by appendiceal  orifice and ileocecal valve. The colonoscopy was                        performed without difficulty. The patient tolerated the                        procedure well. The quality of the bowel preparation                        was fair. Findings:      The perianal and digital rectal examinations were normal. Pertinent       negatives include normal sphincter tone and no palpable rectal lesions.      A single large localized angioectasia with bleeding on contact was found       in the ascending colon, the source of melena. Vaporization for       hemostasis using argon plasma at 0.3 liters/minute and 20 watts  was       successful. Estimated blood loss: none.      The retroflexed view of the distal rectum and anal verge was normal and       showed no anal or rectal abnormalities.      Multiple large-mouthed diverticula were found in the sigmoid colon. Impression:           - Preparation of the colon was fair.                       - A single colonic angioectasia, the source of melena.                        Treated with argon plasma coagulation (APC).                       - The distal rectum and anal verge are normal on                        retroflexion view.                       - Diverticulosis in the sigmoid colon.                       - No specimens collected. Recommendation:       - Return patient to hospital ward for ongoing care.                       - Advance diet as tolerated today.                       - Continue present medications.                       - Resume Plavix (clopidogrel) at prior dose today. Procedure Code(s):    --- Professional ---                       [phone], Colonoscopy, flexible; with control of bleeding,                        any method Diagnosis Code(s):    --- Professional ---  K55.20, Angiodysplasia of colon without hemorrhage                       K92.1, Melena (includes Hematochezia)                       K57.30, Diverticulosis of large intestine without                        perforation or abscess without bleeding CPT copyright 2016 American Medical Association. All rights reserved. The codes documented in this report are preliminary and upon coder review may  be revised to meet current compliance requirements. Dr. Ulyess Mort Lin Landsman MD, MD 03/30/2017 9:17:43 PM This report has been signed electronically. Number of Addenda: 0 Note Initiated On: 03/30/2017 3:07 PM Scope Withdrawal Time: 0 hours 13 minutes 53 seconds  Total Procedure Duration: 0 hours 21 minutes 4 seconds       [facility]

## 2017-03-30 NOTE — Progress Notes (Signed)
Physical Therapy Evaluation Patient Details Name: Anna Prince MRN: [id number] DOB: Mar 31, 1932 Today's Date: 03/30/2017   History of Present Illness  Pateint is an 83 y.o. female admitted on 07 DEC with GI bleed. s/p EGD and flexible sigmoidscopy. PMH includes CAD, hematochizia, acute peptic ulcer, hiatal hernia, and diverticulosis of colon.  Clinical Impression  Patient is a pleasant female admitted for above listed reasons. On evaluation, patient has impairments of decreased muscle endurance and cardiovascular stamina, limiting ambulation duration. Patient will continue to benefit from progressive PT to improve postural muscle and cardiovascular endurance to improve overall independence upon d/c when medically ready.    Follow Up Recommendations Home health PT    Equipment Recommendations  Rolling walker with 5" wheels    Recommendations for Other Services       Precautions / Restrictions Precautions Precautions: Fall Restrictions Weight Bearing Restrictions: No      Mobility  Bed Mobility Overal bed mobility: Modified Independent             General bed mobility comments: Patient performs bed mobility with modified independence.  Transfers Overall transfer level: Modified independent Equipment used: Rolling walker (2 wheeled)             General transfer comment: Patient performs sit to stand and stand to sit with modI and no LOB.  Ambulation/Gait Ambulation/Gait assistance: Min guard Ambulation Distance (Feet): 50 Feet Assistive device: Rolling walker (2 wheeled)       General Gait Details: Patient ambulates at decreased cadence with increased forward lean and decreased TKE.   Stairs            Wheelchair Mobility    Modified Rankin (Stroke Patients Only)       Balance Overall balance assessment: Modified Independent                                           Pertinent Vitals/Pain Pain Assessment: No/denies pain     Home Living Family/patient expects to be discharged to:: Private residence Living Arrangements: Children;Other relatives Available Help at Discharge: Family;Available 24 hours/day Type of Home: House Home Access: Stairs to enter Entrance Stairs-Rails: None Entrance Stairs-Number of Steps: 2 Home Layout: One level Home Equipment: Environmental consultant - 4 wheels      Prior Function Level of Independence: Needs assistance   Gait / Transfers Assistance Needed: Performed ModI in household  ADL's / Homemaking Assistance Needed: Performed by daughters        Hand Dominance        Extremity/Trunk Assessment   Upper Extremity Assessment Upper Extremity Assessment: Generalized weakness    Lower Extremity Assessment Lower Extremity Assessment: Generalized weakness    Cervical / Trunk Assessment Cervical / Trunk Assessment: Kyphotic(Scoliosis)  Communication   Communication: No difficulties  Cognition Arousal/Alertness: Awake/alert Behavior During Therapy: WFL for tasks assessed/performed Overall Cognitive Status: Within Functional Limits for tasks assessed                                        General Comments      Exercises     Assessment/Plan    PT Assessment Patient needs continued PT services  PT Problem List Decreased strength;Decreased range of motion;Decreased balance;Decreased activity tolerance;Decreased mobility;Decreased knowledge of use of DME;Decreased safety awareness;Pain  PT Treatment Interventions DME instruction;Gait training;Stair training;Functional mobility training;Therapeutic activities;Therapeutic exercise;Balance training;Patient/family education    PT Goals (Current goals can be found in the Care Plan section)  Acute Rehab PT Goals Patient Stated Goal: To go home PT Goal Formulation: With patient Time For Goal Achievement: 04/13/17 Potential to Achieve Goals: Good    Frequency Min 2X/week   Barriers to discharge         Co-evaluation               AM-PAC PT "6 Clicks" Daily Activity  Outcome Measure Difficulty turning over in bed (including adjusting bedclothes, sheets and blankets)?: A Little Difficulty moving from lying on back to sitting on the side of the bed? : A Little Difficulty sitting down on and standing up from a chair with arms (e.g., wheelchair, bedside commode, etc,.)?: A Little Help needed moving to and from a bed to chair (including a wheelchair)?: A Little Help needed walking in hospital room?: A Little Help needed climbing 3-5 steps with a railing? : A Little 6 Click Score: 18    End of Session Equipment Utilized During Treatment: Gait belt Activity Tolerance: Patient tolerated treatment well;Patient limited by fatigue Patient left: in bed;with call bell/phone within reach;with bed alarm set   PT Visit Diagnosis: Muscle weakness (generalized) (M62.81);Difficulty in walking, not elsewhere classified (R26.2);Pain Pain - Right/Left: Left Pain - part of body: Hip    Time: 7209-4709 PT Time Calculation (min) (ACUTE ONLY): 21 min   Charges:   PT Evaluation $PT Eval Low Complexity: 1 Low     PT G Codes:   PT G-Codes **NOT FOR INPATIENT CLASS** Functional Assessment Tool Used: AM-PAC 6 Clicks Basic Mobility;Clinical judgement Functional Limitation: Mobility: Walking and moving around Mobility: Walking and Moving Around Current Status (G2836): At least 40 percent but less than 60 percent impaired, limited or restricted Mobility: Walking and Moving Around Goal Status [phone]): At least 20 percent but less than 40 percent impaired, limited or restricted      Dorice Lamas, PT, DPT, COMT 03/30/2017, 11:44 AM

## 2017-03-30 NOTE — Progress Notes (Signed)
[facility] [place] NAME: Anna Prince    MR#:  [id number]  DATE OF BIRTH:  10/01/1931  SUBJECTIVE:   No dark-colored stools  REVIEW OF SYSTEMS:    Review of Systems  Constitutional: Negative for fever, chills weight loss Does not disease this morning HENT: Negative for ear pain, nosebleeds, congestion, facial swelling, rhinorrhea, neck pain, neck stiffness and ear discharge.   Respiratory: Negative for cough, shortness of breath, wheezing  Cardiovascular: Negative for chest pain, palpitations and leg swelling.  Gastrointestinal: She has had some dark-colored stools as well as blood in her stools at times no abdominal pain and diarrhea is resolving  Genitourinary: Negative for dysuria, urgency, frequency, hematuria Musculoskeletal: Negative for back pain or joint pain Neurological: Negative for dizziness, seizures, syncope, focal weakness,  numbness and headaches.  Hematological: Does not bruise/bleed easily.  Psychiatric/Behavioral: Negative for hallucinations, ++confusion, dysphoric mood    Tolerating Diet: Nothing by mouth  DRUG ALLERGIES:   Allergies  Allergen Reactions  . Morphine Sulfate Other (See Comments)    Reaction:  Hallucinations     VITALS:  Blood pressure (!) 145/56, pulse (!) 32, temperature 98.8 F (37.1 C), temperature source Oral, resp. rate 20, height 5\' 5"  (1.651 m), weight 53.1 kg (117 lb), SpO2 94 %.  PHYSICAL EXAMINATION:  Constitutional: Appears well-developed and well-nourished. No distress. HENT: Normocephalic. Marland Kitchen Oropharynx is clear and moist.  Eyes: Conjunctivae and EOM are normal. PERRLA, no scleral icterus.  Neck: Normal ROM. Neck supple. No JVD. No tracheal deviation. CVS: RRR, S1/S2 +, no murmurs, no gallops, no carotid bruit.  Pulmonary: Effort and breath sounds normal, no stridor, rhonchi, wheezes, rales.  Abdominal: Soft. BS +,  no distension, tenderness, rebound or guarding.   Musculoskeletal: Normal range of motion. No edema and no tenderness.  Neuro: Alert. CN 2-12 grossly intact. No focal deficits. Skin: Skin is warm and dry. No rash noted. Psychiatric: Some mild confusion noted    LABORATORY PANEL:   CBC Recent Labs  Lab 03/30/17 0536  WBC 5.0  HGB 9.6*  HCT 29.3*  PLT 224   ------------------------------------------------------------------------------------------------------------------  Chemistries  Recent Labs  Lab 03/27/17 0538 03/30/17 0536  NA 139 142  K 4.4 3.8  CL 110 106  CO2 23 26  GLUCOSE 95 96  BUN 25* 17  CREATININE 1.64* 1.43*  CALCIUM 8.5* 9.1  AST 25  --   ALT 16  --   ALKPHOS 66  --   BILITOT 0.8  --    ------------------------------------------------------------------------------------------------------------------  Cardiac Enzymes No results for input(s): TROPONINI in the last 168 hours. ------------------------------------------------------------------------------------------------------------------  RADIOLOGY:  No results found.   ASSESSMENT AND PLAN:   83 year old female with a history of peptic ulcer disease, CAD status post DES in May 2018 on aspirin and Plavix who presents with dizziness and diarrhea and found to have anemia.  1. Acute on chronic symptomatic acute blood loss anemia due to GI bleed Status post 2 units PRBCs EGD showed non bleeding gastric ulcers  -Colonoscopy today Continue PPI Monitor hemoglobin, hemoglobin stable at 9.6 Transfuse if less than 7 hemoglobin Continue ferrous sulfate  2. CAD status post stent May 2018 Hold aspirin and Plavix for now Once stable from GI bleeding perspective will likely need to continue Plavix alone without aspirin Continue metoprolol, atorvastatin Appreciate Cardiology evaluation  3. Depression: Continue Paxil  4. Chronic kidney disease stage III: Creatinine stable  5. Confusion: Monitor mental status likelyy has some component of  cognitive  impairment at baseline exacerbated by acute hospital stay.  D/c later today if rest of GI w/u negative Pt requesting to go home  Management plans discussed with the patient and she is in agreement.  CODE STATUS: DNR  TOTAL TIME TAKING CARE OF THIS PATIENT: 39minutes.   Physical therapy evaluation for discharge planning.     Fritzi Mandes M.D on 03/30/2017 at 8:02 AM  Between 7am to 6pm - Pager - [phone] After 6pm go to [email or web address] - password [email or web address] [facility]  Office  [phone]  CC: Primary care physician; Crecencio Mc, MD  Note: This dictation was prepared with Dragon dictation along with smaller phrase technology. Any transcriptional errors that result from this process are unintentional.

## 2017-03-30 NOTE — Transfer of Care (Signed)
Immediate Anesthesia Transfer of Care Note  Patient: Anna Prince  Procedure(s) Performed: COLONOSCOPY (N/A )  Patient Location: PACU  Anesthesia Type:General  Level of Consciousness: sedated  Airway & Oxygen Therapy: Patient Spontanous Breathing and Patient connected to nasal cannula oxygen  Post-op Assessment: Report given to RN and Post -op Vital signs reviewed and stable  Post vital signs: Reviewed and stable  Last Vitals:  Vitals:   03/30/17 0800 03/30/17 0832  BP: (!) 145/56 (!) 156/43  Pulse: 65 (!) 51  Resp: 20 18  Temp:  36.9 C  SpO2: 94% 98%    Last Pain:  Vitals:   03/30/17 1800  TempSrc:   PainSc: 7       Patients Stated Pain Goal: 0 (19/37/90 2409)  Complications: No apparent anesthesia complications

## 2017-03-30 NOTE — Progress Notes (Signed)
Patient returned from Colonoscopy, A & O x 4. Denied any acute pain at this time. No respiratory distress noted. Patient indicated her daughter is coming to pick her up tonight. No D/C orders in place. Dr. Jerelyn Charles notified and he indicated patient is going to be kept tonight. Ms.  Steva Ready (patient daughter) notified that patient will be moniotred tonight per Dr. Jerelyn Charles. Will continue to monitor.

## 2017-03-30 NOTE — Progress Notes (Signed)
Colonoscopy post procedure note:  Liquid brown stool in entire colon, moderate amounts A large AVM in ascending colon that bled on contact with scope, hemostasis achieved and lesion destroyed with APC Severe sigmoid diverticulosis Colon otherwise normal Unable to intubate TI  Recs:  Resume diet Restart plavix Check ferritin, iron, TIBC, B12 and folate levels, replace if needed If pt rebleeds, recommend VCE to evalute for small bowel AVMs Ok to discharge home from GI stand point Follow up with [facility] in 2-4 weeks after discharge  Cephas Darby, MD [address]  [place]  [place], [place] [address]  Main: [phone]  Fax: [phone] Pager: [phone]

## 2017-03-30 NOTE — Anesthesia Preprocedure Evaluation (Signed)
Anesthesia Evaluation  Patient identified by MRN, date of birth, ID band Patient awake    Reviewed: Allergy & Precautions, NPO status , Patient's Chart, lab work & pertinent test results, reviewed documented beta blocker date and time   Airway Mallampati: II  TM Distance: >3 FB     Dental  (+) Chipped, Upper Dentures, Partial Lower   Pulmonary neg shortness of breath, COPD,  COPD inhaler and oxygen dependent, neg recent URI, Current Smoker,           Cardiovascular hypertension, Pt. on medications and Pt. on home beta blockers + CAD, + Past MI, + Cardiac Stents and + Peripheral Vascular Disease       Neuro/Psych PSYCHIATRIC DISORDERS Anxiety Depression    GI/Hepatic hiatal hernia, PUD, GERD  ,  Endo/Other    Renal/GU      Musculoskeletal  (+) Arthritis ,   Abdominal   Peds  Hematology  (+) anemia ,   Anesthesia Other Findings Denies CVA. Has PACs.EF 55. Hb 7.9.  Reproductive/Obstetrics                             Anesthesia Physical  Anesthesia Plan  ASA: III  Anesthesia Plan: General   Post-op Pain Management:    Induction: Intravenous  PONV Risk Score and Plan: Propofol infusion  Airway Management Planned: Nasal Cannula  Additional Equipment:   Intra-op Plan:   Post-operative Plan:   Informed Consent: I have reviewed the patients History and Physical, chart, labs and discussed the procedure including the risks, benefits and alternatives for the proposed anesthesia with the patient or authorized representative who has indicated his/her understanding and acceptance.     Plan Discussed with: CRNA  Anesthesia Plan Comments:         Anesthesia Quick Evaluation

## 2017-03-30 NOTE — Progress Notes (Signed)
Progress Note  Patient Name: Anna Prince Date of Encounter: 03/30/2017  Primary Cardiologist: Dr. Saunders Revel  Subjective   She denies any chest pain.  She has no SOB.  She is ambulating with PT.    Inpatient Medications    Scheduled Meds: . atorvastatin  40 mg Oral q1800  . ferrous sulfate  325 mg Oral Q breakfast  . lisinopril  20 mg Oral Daily  . megestrol  20 mg Oral Daily  . metoprolol tartrate  25 mg Oral BID  . mirtazapine  45 mg Oral QHS  . mometasone-formoterol  2 puff Inhalation BID  . nicotine  21 mg Transdermal Daily  . pantoprazole  40 mg Oral BID AC  . PARoxetine  30 mg Oral Daily  . sodium chloride flush  3 mL Intravenous Q12H   Continuous Infusions:  PRN Meds: albuterol, diazepam, HYDROcodone-acetaminophen, nitroGLYCERIN, ondansetron **OR** ondansetron (ZOFRAN) IV   Vital Signs    Vitals:   03/29/17 1938 03/30/17 0500 03/30/17 0800 03/30/17 0832  BP: (!) 153/61 136/82 (!) 145/56 (!) 156/43  Pulse: 61 62 65 (!) 51  Resp: 17 18 20 18   Temp: 97.7 F (36.5 C) 98.8 F (37.1 C)  98.4 F (36.9 C)  TempSrc: Oral Oral  Oral  SpO2: 100% 98% 94% 98%  Weight:      Height:        Intake/Output Summary (Last 24 hours) at 03/30/2017 1058 Last data filed at 03/30/2017 0400 Gross per 24 hour  Intake 900 ml  Output 1600 ml  Net -700 ml   Filed Weights   03/26/17 1640 03/29/17 1304  Weight: 117 lb (53.1 kg) 117 lb (53.1 kg)    Telemetry    NSR - Personally Reviewed  ECG    NA - Personally Reviewed  Physical Exam   GEN: No acute distress.   Neck: No  JVD Cardiac: RRR, no murmurs, rubs, or gallops.  Respiratory: Clear  to auscultation bilaterally. GI: Soft, nontender, non-distended  MS: No  edema; No deformity. Neuro:  Nonfocal  Psych: Normal affect   Labs    Chemistry Recent Labs  Lab 03/26/17 1638 03/27/17 0538 03/30/17 0536  NA 138 139 142  K 4.5 4.4 3.8  CL 105 110 106  CO2 23 23 26   GLUCOSE 102* 95 96  BUN 28* 25* 17    CREATININE 1.68* 1.64* 1.43*  CALCIUM 8.5* 8.5* 9.1  PROT 6.1* 5.6*  --   ALBUMIN 3.3* 3.0*  --   AST 14* 25  --   ALT 9* 16  --   ALKPHOS 68 66  --   BILITOT 0.1* 0.8  --   GFRNONAA 27* 28* 33*  GFRAA 31* 32* 38*  ANIONGAP 10 6 10      Hematology Recent Labs  Lab 03/28/17 1358 03/28/17 2005 03/30/17 0536  WBC 5.0 4.8 5.0  RBC 2.78* 2.78* 3.40*  HGB 7.9* 7.9* 9.6*  HCT 23.9* 23.9* 29.3*  MCV 86.2 85.7 86.1  MCH 28.6 28.3 28.2  MCHC 33.2 33.0 32.7  RDW 14.1 14.0 14.6*  PLT 202 199 224    Cardiac EnzymesNo results for input(s): TROPONINI in the last 168 hours. No results for input(s): TROPIPOC in the last 168 hours.   BNPNo results for input(s): BNP, PROBNP in the last 168 hours.   DDimer No results for input(s): DDIMER in the last 168 hours.   Radiology    No results found.  Cardiac Studies   NA  Patient Profile  83 y.o. female with known history of coronary artery disease status post high risk atherectomy and drug-eluting stent placement to the proximal LAD in May 2018, prior GI bleed, COPD and AAA.  She presented with GI bleed.   Assessment & Plan    CAD:  DAPT was on hold secondary to GI bleed.  EGD yesterday with two non bleeding linear gastric ulcers.  She is to have colon later today.  I will speak with GI because as soon as she is able she should back on Plavix.     For questions or updates, please contact [facility] Please consult [email or web address] for contact info under Cardiology/STEMI.   Signed, Minus Breeding, MD  03/30/2017, 10:58 AM

## 2017-03-31 LAB — VITAMIN B12: VITAMIN B 12: 380 pg/mL (ref 180–914)

## 2017-03-31 MED ORDER — CLOPIDOGREL BISULFATE 75 MG PO TABS
75.0000 mg | ORAL_TABLET | Freq: Every day | ORAL | Status: DC
  Administered 2017-04-01: 75 mg via ORAL

## 2017-03-31 NOTE — Progress Notes (Signed)
[facility] at [place] NAME: Anna Prince    MR#:  [id number]  DATE OF BIRTH:  19-Jun-1931  SUBJECTIVE:   No dark-colored stools Pt out in the chair  REVIEW OF SYSTEMS:    Review of Systems  Constitutional: Negative for fever, chills weight loss Does not disease this morning HENT: Negative for ear pain, nosebleeds, congestion, facial swelling, rhinorrhea, neck pain, neck stiffness and ear discharge.   Respiratory: Negative for cough, shortness of breath, wheezing  Cardiovascular: Negative for chest pain, palpitations and leg swelling.  Gastrointestinal: She has had some dark-colored stools as well as blood in her stools at times no abdominal pain and diarrhea is resolving  Genitourinary: Negative for dysuria, urgency, frequency, hematuria Musculoskeletal: Negative for back pain or joint pain Neurological: Negative for dizziness, seizures, syncope, focal weakness,  numbness and headaches.  Hematological: Does not bruise/bleed easily.  Psychiatric/Behavioral: Negative for hallucinations, ++confusion, dysphoric mood  DRUG ALLERGIES:   Allergies  Allergen Reactions  . Morphine Sulfate Other (See Comments)    Reaction:  Hallucinations     VITALS:  Blood pressure (!) 169/51, pulse 74, temperature 98.7 F (37.1 C), temperature source Oral, resp. rate 18, height 5\' 5"  (1.651 m), weight 53.1 kg (117 lb), SpO2 97 %.  PHYSICAL EXAMINATION:  Constitutional: Appears well-developed and well-nourished. No distress. HENT: Normocephalic. Marland Kitchen Oropharynx is clear and moist.  Eyes: Conjunctivae and EOM are normal. PERRLA, no scleral icterus.  Neck: Normal ROM. Neck supple. No JVD. No tracheal deviation. CVS: RRR, S1/S2 +, no murmurs, no gallops, no carotid bruit.  Pulmonary: Effort and breath sounds normal, no stridor, rhonchi, wheezes, rales.  Abdominal: Soft. BS +,  no distension, tenderness, rebound or guarding.  Musculoskeletal: Normal range of  motion. No edema and no tenderness.  Neuro: Alert. CN 2-12 grossly intact. No focal deficits. Skin: Skin is warm and dry. No rash noted. Psychiatric: Some mild confusion noted    LABORATORY PANEL:   CBC Recent Labs  Lab 03/30/17 0536  WBC 5.0  HGB 9.6*  HCT 29.3*  PLT 224   ------------------------------------------------------------------------------------------------------------------  Chemistries  Recent Labs  Lab 03/27/17 0538 03/30/17 0536  NA 139 142  K 4.4 3.8  CL 110 106  CO2 23 26  GLUCOSE 95 96  BUN 25* 17  CREATININE 1.64* 1.43*  CALCIUM 8.5* 9.1  AST 25  --   ALT 16  --   ALKPHOS 66  --   BILITOT 0.8  --    ------------------------------------------------------------------------------------------------------------------  Cardiac Enzymes No results for input(s): TROPONINI in the last 168 hours. ------------------------------------------------------------------------------------------------------------------  RADIOLOGY:  No results found.   ASSESSMENT AND PLAN:   83 year old female with a history of peptic ulcer disease, CAD status post DES in May 2018 on aspirin and Plavix who presents with dizziness and diarrhea and found to have anemia.  1. Acute on chronic symptomatic acute blood loss anemia due to GI bleed Status post 2 units PRBCs EGD showed non bleeding gastric ulcers  -Colonoscopy showed large colon angiectasia that was lasered -Continue PPI -Monitor hemoglobin, hemoglobin stable at 9.6 -Transfuse if less than 7 hemoglobin -Continue ferrous sulfate  2. CAD status post stent May 2018 -will resume plavix now -Continue metoprolol, atorvastatin -Appreciate Cardiology evaluation  3. Depression: Continue Paxil  4. Chronic kidney disease stage III: Creatinine stable  5. Confusion: Monitor mental status likely has some component of cognitive impairment at baseline   HHPT at d/c Unable to send pt home due  to poor weather  conditions  Management plans discussed with the patient and she is in agreement.  CODE STATUS: DNR  TOTAL TIME TAKING CARE OF THIS PATIENT: 76minutes.   Physical therapy evaluation for discharge planning.     Fritzi Mandes M.D on 03/31/2017 at 1:06 PM  Between 7am to 6pm - Pager - [phone] After 6pm go to [email or web address] - password [email or web address] [facility]  Office  [phone]  CC: Primary care physician; Crecencio Mc, MD  Note: This dictation was prepared with Dragon dictation along with smaller phrase technology. Any transcriptional errors that result from this process are unintentional.

## 2017-03-31 NOTE — Progress Notes (Signed)
Patient can accurately answer the orientation questions. Yet throughout the day she thinks she is at home and wants to go to other rooms.  When I reorient her to where she is she says "I know that I just keep forgetting."  Her anxiety has increased through the day due to continually needing to be reminded she can't get up without assistance.

## 2017-03-31 NOTE — Progress Notes (Signed)
[place] responded to request to help calm patient. CH was with security guard. [place] arrived and nurse stated patient was calm now and that I could come back at a later time to speak with her. CH is available upon request. Silent prayer offered.   03/31/17 1305  Clinical Encounter Type  Visited With Patient not available;Health care provider  Visit Type Initial;Spiritual support  Referral From Nurse

## 2017-03-31 NOTE — Anesthesia Postprocedure Evaluation (Signed)
Anesthesia Post Note  Patient: Anna Prince  Procedure(s) Performed: COLONOSCOPY (N/A )  Patient location during evaluation: Endoscopy Anesthesia Type: General Level of consciousness: awake and alert Pain management: pain level controlled Vital Signs Assessment: post-procedure vital signs reviewed and stable Respiratory status: spontaneous breathing, nonlabored ventilation, respiratory function stable and patient connected to nasal cannula oxygen Cardiovascular status: blood pressure returned to baseline and stable Postop Assessment: no apparent nausea or vomiting Anesthetic complications: no     Last Vitals:  Vitals:   03/30/17 2216 03/31/17 0315  BP: (!) 165/61 129/64  Pulse: 63 65  Resp: 17 18  Temp: 36.7 C 37.1 C  SpO2: 98% 99%    Last Pain:  Vitals:   03/31/17 0315  TempSrc: Oral  PainSc:                  Martha Clan

## 2017-04-01 ENCOUNTER — Encounter: Payer: Self-pay | Admitting: Gastroenterology

## 2017-04-01 NOTE — Discharge Instructions (Signed)

## 2017-04-01 NOTE — Care Management Important Message (Signed)
Important Message  Patient Details  Name: Anna Prince MRN: [id number] Date of Birth: Oct 01, 1931   Medicare Important Message Given:  Yes    Katrina Stack, RN 04/01/2017, 10:01 AM

## 2017-04-01 NOTE — Discharge Summary (Signed)
[facility] NAME: Anna Prince    MR#:  [id number]  DATE OF BIRTH:  [birth year]  DATE OF ADMISSION:  03/26/2017 ADMITTING PHYSICIAN: Nicholes Mango, MD  DATE OF DISCHARGE: 04/01/2017  PRIMARY CARE PHYSICIAN: Crecencio Mc, MD    ADMISSION DIAGNOSIS:  Gastrointestinal hemorrhage, unspecified gastrointestinal hemorrhage type [K92.2] Anemia, unspecified type [D64.9] Diarrhea, unspecified type [R19.7]  DISCHARGE DIAGNOSIS:  Melena due to Colonic angiectasia Non bleeding gatstric ulcers CAD with PCI (this year)  Now resumed plavix  SECONDARY DIAGNOSIS:   Past Medical History:  Diagnosis Date  . AAA (abdominal aortic aneurysm) ([place])   . Anxiety   . Aortic aneurysm ([place])   . Arthritis   . Barrett's esophagus 2007  . Bursitis 2012   left hip, improved with periodic steroid injection [facility] [place])  . COPD (chronic obstructive pulmonary disease) ([place])   . Coronary artery disease    a. LHC (08/27/16): 15% ostial LM, 99% pLAD and D1, Ramus & LCx nl, m/dRCA diffuse 80-90%, PDA occluded proximally, fills via L-R collats, EF 50-55%; b. PCI (08/31/16): Severe pLAD dz involving D1, successful orbital atherectomy PCI to pLAD w/ Synergy 3.0 x 20 mm DES, 0% residual stenosis & TIMI 3 flow, occluded ostD1, medically managed given lack of CP  . Depression   . Diastolic dysfunction    a. TTE (08/25/16): Mild LVH with LVEF of 50-55% and grade 1 diastolic dysfunction. Mild MR. Mild right atrial enlargement. Normal RV size and function  . GERD (gastroesophageal reflux disease)    with Barretts Esophagus  . GI bleeding    a. required pRBC May, July, and December 2018  . Hyperlipidemia   . Hypertension   . Iron deficiency anemia 2012  . Lumbago   . Macular degeneration   . Osteoporosis   . Sigmoid diverticulosis    by colonoscopy  . Stroke [facility])     HOSPITAL COURSE:   83 year old female with a history of peptic ulcer disease, CAD  status post DES in May 2018 on aspirin and Plavix who presents with dizziness and diarrhea and found to have anemia.  1. Acute on chronic symptomatic acute blood loss anemia due to GI bleed Status post 2 units PRBCs EGD showed non bleeding gastric ulcers  -Colonoscopy showed large colon angiectasia that was lasered -Continue PPI -Monitor hemoglobin, hemoglobin stable at 9.6 -Transfuse if less than 7 hemoglobin -Continue ferrous sulfate  2. CAD status post stent May 2018 -will resume plavix now -Continue metoprolol, atorvastatin -Appreciate Cardiology evaluation  3. Depression: Continue Paxil  4. Chronic kidney disease stage III: Creatinine stable  5. Confusion: Monitor mental status likely has some component of cognitive impairment at baseline   HHPT at d/c Spoke with dter Steva Ready ---d/c home later today. dter making arrangements to come pick her up.   CONSULTS OBTAINED:  Treatment Team:  Minna Merritts, MD Virgel Manifold, MD Lin Landsman, MD  DRUG ALLERGIES:   Allergies  Allergen Reactions  . Morphine Sulfate Other (See Comments)    Reaction:  Hallucinations     DISCHARGE MEDICATIONS:   Allergies as of 04/01/2017      Reactions   Morphine Sulfate Other (See Comments)   Reaction:  Hallucinations       Medication List    STOP taking these medications   aspirin 81 MG EC tablet     TAKE these medications   albuterol 108 (90 Base) MCG/ACT inhaler  Commonly known as:  PROVENTIL HFA;VENTOLIN HFA Inhale 2 puffs into the lungs every 6 (six) hours as needed for wheezing or shortness of breath.   atorvastatin 40 MG tablet Commonly known as:  LIPITOR Take 1 tablet (40 mg total) by mouth daily at 6 PM.   budesonide-formoterol 80-4.5 MCG/ACT inhaler Commonly known as:  SYMBICORT Inhale 2 puffs into the lungs 2 (two) times daily.   clopidogrel 75 MG tablet Commonly known as:  PLAVIX Take 1 tablet (75 mg total) by mouth daily.   diazepam  5 MG tablet Commonly known as:  VALIUM Take 1 tablet (5 mg total) by mouth 2 (two) times daily as needed.   ferrous sulfate 325 (65 FE) MG tablet Take 1 tablet (325 mg total) by mouth daily with breakfast.   HYDROcodone-acetaminophen 5-325 MG tablet Commonly known as:  NORCO/VICODIN Take 1 tablet by mouth every 6 (six) hours as needed for moderate pain. Maximum 2 daily.   lisinopril 20 MG tablet Commonly known as:  PRINIVIL,ZESTRIL TAKE 1 TABLET BY MOUTH EVERY DAY What changed:  Another medication with the same name was removed. Continue taking this medication, and follow the directions you see here.   megestrol 40 MG tablet Commonly known as:  MEGACE TAKE 1 TABLET(40 MG) BY MOUTH DAILY   metoprolol tartrate 25 MG tablet Commonly known as:  LOPRESSOR Take 0.5 tablets (12.5 mg total) by mouth 2 (two) times daily.   mirtazapine 45 MG tablet Commonly known as:  REMERON Take 1 tablet (45 mg total) by mouth at bedtime.   nitroGLYCERIN 0.4 MG SL tablet Commonly known as:  NITROSTAT Place 1 tablet (0.4 mg total) under the tongue every 5 (five) minutes x 3 doses as needed for chest pain.   pantoprazole 40 MG tablet Commonly known as:  PROTONIX Take 1 tablet (40 mg total) by mouth 2 (two) times daily.   PARoxetine 30 MG tablet Commonly known as:  PAXIL Take 1 tablet (30 mg total) by mouth daily. To be refilled by PCP   PARoxetine 30 MG tablet Commonly known as:  PAXIL TAKE 1 TABLET BY MOUTH DAILY            Durable Medical Equipment  (From admission, onward)        Start     Ordered   03/30/17 1254  For home use only DME Walker rolling  Once    Question:  Patient needs a walker to treat with the following condition  Answer:  Weakness   03/30/17 1253      If you experience worsening of your admission symptoms, develop shortness of breath, life threatening emergency, suicidal or homicidal thoughts you must seek medical attention immediately by calling 911 or calling  your MD immediately  if symptoms less severe.  You Must read complete instructions/literature along with all the possible adverse reactions/side effects for all the Medicines you take and that have been prescribed to you. Take any new Medicines after you have completely understood and accept all the possible adverse reactions/side effects.   Please note  You were cared for by a hospitalist during your hospital stay. If you have any questions about your discharge medications or the care you received while you were in the hospital after you are discharged, you can call the unit and asked to speak with the hospitalist on call if the hospitalist that took care of you is not available. Once you are discharged, your primary care physician will handle any further medical issues. Please  note that NO REFILLS for any discharge medications will be authorized once you are discharged, as it is imperative that you return to your primary care physician (or establish a relationship with a primary care physician if you do not have one) for your aftercare needs so that they can reassess your need for medications and monitor your lab values. Today   SUBJECTIVE  No new complaints   VITAL SIGNS:  Blood pressure (!) 150/49, pulse 68, temperature 97.6 F (36.4 C), temperature source Oral, resp. rate 16, height 5\' 5"  (1.651 m), weight 53.1 kg (117 lb), SpO2 99 %.  I/O:    Intake/Output Summary (Last 24 hours) at 04/01/2017 0821 Last data filed at 03/31/2017 1858 Gross per 24 hour  Intake 720 ml  Output 400 ml  Net 320 ml    PHYSICAL EXAMINATION:  GENERAL:  83 y.o.-year-old patient lying in the bed with no acute distress.  EYES: Pupils equal, round, reactive to light and accommodation. No scleral icterus. Extraocular muscles intact.  HEENT: Head atraumatic, normocephalic. Oropharynx and nasopharynx clear.  NECK:  Supple, no jugular venous distention. No thyroid enlargement, no tenderness.  LUNGS: Normal breath  sounds bilaterally, no wheezing, rales,rhonchi or crepitation. No use of accessory muscles of respiration.  CARDIOVASCULAR: S1, S2 normal. No murmurs, rubs, or gallops.  ABDOMEN: Soft, non-tender, non-distended. Bowel sounds present. No organomegaly or mass.  EXTREMITIES: No pedal edema, cyanosis, or clubbing.  NEUROLOGIC: Cranial nerves II through XII are intact. Muscle strength 5/5 in all extremities. Sensation intact. Gait not checked.  PSYCHIATRIC: The patient is alert and oriented x 3.  SKIN: No obvious rash, lesion, or ulcer.   DATA REVIEW:   CBC  Recent Labs  Lab 03/30/17 0536  WBC 5.0  HGB 9.6*  HCT 29.3*  PLT 224    Chemistries  Recent Labs  Lab 03/27/17 0538 03/30/17 0536  NA 139 142  K 4.4 3.8  CL 110 106  CO2 23 26  GLUCOSE 95 96  BUN 25* 17  CREATININE 1.64* 1.43*  CALCIUM 8.5* 9.1  AST 25  --   ALT 16  --   ALKPHOS 66  --   BILITOT 0.8  --     Microbiology Results   No results found for this or any previous visit (from the past 240 hour(s)).  RADIOLOGY:  No results found.   Management plans discussed with the patient, family and they are in agreement.  CODE STATUS:     Code Status Orders  (From admission, onward)        Start     Ordered   03/26/17 2043  Do not attempt resuscitation (DNR)  Continuous    Question Answer Comment  In the event of cardiac or respiratory ARREST Do not call a "code blue"   In the event of cardiac or respiratory ARREST Do not perform Intubation, CPR, defibrillation or ACLS   In the event of cardiac or respiratory ARREST Use medication by any route, position, wound care, and other measures to relive pain and suffering. May use oxygen, suction and manual treatment of airway obstruction as needed for comfort.   Comments RN may pronounce      03/26/17 2043    Code Status History    Date Active Date Inactive Code Status Order ID Comments User Context   11/06/2016 01:08 11/08/2016 13:52 Full Code [id number]  Lance Coon, MD Inpatient   08/28/2016 10:29 09/02/2016 18:23 Full Code [id number]  Nani Skillern, PA-C Inpatient  08/24/2016 18:13 08/28/2016 08:47 Full Code [id number]  Demetrios Loll, MD Inpatient   04/15/2015 01:59 04/18/2015 17:49 Full Code [id number]  Lance Coon, MD Inpatient   04/09/2015 06:42 04/10/2015 15:41 Full Code [id number]  Harrie Foreman, MD Inpatient   02/03/2015 07:51 02/04/2015 14:33 DNR [id number]  Lance Coon, MD Inpatient    Advance Directive Documentation     Most Recent Value  Type of Advance Directive  Healthcare Power of Attorney  Pre-existing out of facility DNR order (yellow form or pink MOST form)  No data  "MOST" Form in Place?  No data      TOTAL TIME TAKING CARE OF THIS PATIENT: *40* minutes.    Fritzi Mandes M.D on 04/01/2017 at 8:21 AM  Between 7am to 6pm - Pager - [phone] After 6pm go to [email or web address] - password [email or web address] [facility]  Office  [phone]  CC: Primary care physician; Crecencio Mc, MD

## 2017-04-01 NOTE — Care Management (Signed)
Patient is declining home health nursing and physical therapy.  CM many attempt to persuade patient to at least accept the physical therapy and she declined.  Also says she is not in need of a walker. Notified attending

## 2017-04-02 ENCOUNTER — Encounter: Payer: Self-pay | Admitting: Gastroenterology

## 2017-04-04 ENCOUNTER — Telehealth: Payer: Self-pay | Admitting: Internal Medicine

## 2017-04-04 NOTE — Telephone Encounter (Signed)
Transition Care Management Follow-up Telephone Call  How have you been since you were released from the hospital? Patient stated she feels week and tired since being home.   Do you understand why you were in the hospital? yes   Do you understand the discharge instrcutions? yes  Items Reviewed:  Medications reviewed: yes  Allergies reviewed: yes  Dietary changes reviewed: yes  Referrals reviewed: yes   Functional Questionnaire:   Activities of Daily Living (ADLs):   She states they are independent in the following: ambulation, bathing and hygiene, feeding, continence, grooming, toileting and dressing States they require assistance with the following: No assistance needed.   Any transportation issues/concerns?: no   Any patient concerns? no   Confirmed importance and date/time of follow-up visits scheduled: yes   Confirmed with patient if condition begins to worsen call PCP or go to the ER.  Patient was given the Call-a-Nurse line [phone]: yes

## 2017-04-08 ENCOUNTER — Ambulatory Visit (INDEPENDENT_AMBULATORY_CARE_PROVIDER_SITE_OTHER): Payer: Medicare Other | Admitting: Internal Medicine

## 2017-04-08 ENCOUNTER — Encounter: Payer: Self-pay | Admitting: Internal Medicine

## 2017-04-08 VITALS — BP 130/86 | HR 83 | Temp 97.9°F | Resp 15 | Ht 65.0 in | Wt 124.4 lb

## 2017-04-08 DIAGNOSIS — Z09 Encounter for follow-up examination after completed treatment for conditions other than malignant neoplasm: Secondary | ICD-10-CM

## 2017-04-08 DIAGNOSIS — F0153 Vascular dementia, unspecified severity, with mood disturbance: Secondary | ICD-10-CM

## 2017-04-08 DIAGNOSIS — F329 Major depressive disorder, single episode, unspecified: Secondary | ICD-10-CM

## 2017-04-08 DIAGNOSIS — I2510 Atherosclerotic heart disease of native coronary artery without angina pectoris: Secondary | ICD-10-CM | POA: Diagnosis not present

## 2017-04-08 DIAGNOSIS — N183 Chronic kidney disease, stage 3 unspecified: Secondary | ICD-10-CM

## 2017-04-08 DIAGNOSIS — K921 Melena: Secondary | ICD-10-CM | POA: Diagnosis not present

## 2017-04-08 DIAGNOSIS — D500 Iron deficiency anemia secondary to blood loss (chronic): Secondary | ICD-10-CM | POA: Diagnosis not present

## 2017-04-08 DIAGNOSIS — F0151 Vascular dementia with behavioral disturbance: Secondary | ICD-10-CM | POA: Diagnosis not present

## 2017-04-08 LAB — CBC WITH DIFFERENTIAL/PLATELET
BASOS ABS: 0 10*3/uL (ref 0.0–0.1)
Basophils Relative: 0.3 % (ref 0.0–3.0)
EOS PCT: 4.1 % (ref 0.0–5.0)
Eosinophils Absolute: 0.2 10*3/uL (ref 0.0–0.7)
HCT: 27.8 % — ABNORMAL LOW (ref 36.0–46.0)
Hemoglobin: 9 g/dL — ABNORMAL LOW (ref 12.0–15.0)
LYMPHS ABS: 0.7 10*3/uL (ref 0.7–4.0)
Lymphocytes Relative: 13.5 % (ref 12.0–46.0)
MCHC: 32.5 g/dL (ref 30.0–36.0)
MCV: 88.7 fl (ref 78.0–100.0)
MONOS PCT: 6.7 % (ref 3.0–12.0)
Monocytes Absolute: 0.4 10*3/uL (ref 0.1–1.0)
NEUTROS ABS: 4.2 10*3/uL (ref 1.4–7.7)
NEUTROS PCT: 75.4 % (ref 43.0–77.0)
PLATELETS: 203 10*3/uL (ref 150.0–400.0)
RBC: 3.14 Mil/uL — ABNORMAL LOW (ref 3.87–5.11)
RDW: 15.2 % (ref 11.5–15.5)
WBC: 5.5 10*3/uL (ref 4.0–10.5)

## 2017-04-08 LAB — BASIC METABOLIC PANEL
BUN: 21 mg/dL (ref 6–23)
CALCIUM: 8.8 mg/dL (ref 8.4–10.5)
CO2: 26 mEq/L (ref 19–32)
CREATININE: 1.42 mg/dL — AB (ref 0.40–1.20)
Chloride: 108 mEq/L (ref 96–112)
GFR: 37.53 mL/min — ABNORMAL LOW (ref >60.00)
Glucose, Bld: 83 mg/dL (ref 70–99)
Potassium: 4.2 mEq/L (ref 3.5–5.1)
Sodium: 140 mEq/L (ref 135–145)

## 2017-04-08 MED ORDER — HYDROCODONE-ACETAMINOPHEN 5-325 MG PO TABS: 1.0000 | ORAL_TABLET | Freq: Four times a day (QID) | ORAL | 0 refills | Status: DC | PRN

## 2017-04-08 NOTE — Patient Instructions (Addendum)
You can try taking Lactase with any meal containing milk.    You can take iron once daily or every other day with a small amount of orange juice to help it gt absorbed.    You should get back on Plavix s soon as possible  For your heart   Anna Prince should be filling out your pill boxes   Avoid motrin, advil and Aleve   You do not need to see the kidney specialist right now unless we cannot improve your anemia with iron

## 2017-04-08 NOTE — Assessment & Plan Note (Signed)
S/p PTCA /stent in May 2018.  Advised to resume Plavix asap.

## 2017-04-08 NOTE — Assessment & Plan Note (Signed)
Multifactorial,  Acute on chronic with GI bleed.  Discharge hgb was 9.6 after transfusion of 2 units for hgb 5.2 starting iron .  Recheck in 1 month  Lab Results  Component Value Date   WBC 5.5 04/08/2017   HGB 9.0 (L) 04/08/2017   HCT 27.8 (L) 04/08/2017   MCV 88.7 04/08/2017   PLT 203.0 04/08/2017

## 2017-04-08 NOTE — Assessment & Plan Note (Signed)
Patient is stable post discharge and has no new issues or questions about discharge plans at the visit today for hospital follow up. All labs , imaging studies and progress notes from admission were reviewed with patient today   

## 2017-04-08 NOTE — Assessment & Plan Note (Signed)
She is having trouble managing her medications.  Daughter [name] will assume responsibility

## 2017-04-08 NOTE — Progress Notes (Signed)
Subjective:  Patient ID: Anna Prince, female    DOB: Jan 26, 1932  Age: 83 y.o. MRN: [id number]  CC: The primary encounter diagnosis was CKD (chronic kidney disease) stage 3, GFR 30-59 ml/min ([place]). Diagnoses of Iron deficiency anemia due to chronic blood loss, Vascular dementia with depressed mood, Gastrointestinal hemorrhage with melena, Coronary artery disease involving native coronary artery of native heart without angina pectoris, and Hospital discharge follow-up were also pertinent to this visit.  HPI Anna Prince presents for hospital follow up . Patient was admitted to [facility] on Dec 10 with symptomatic anemia secondary to GI bleed accompanied by diarrhea.  Hgb was 5.2.  She was  tranfused 2 units PRBC's . Plavix suspended.  EGD and colonoscopy confirmed nonbleeding gastric ulcer and bleeding colonic angiectasia that was lasered.  hgb rose to  7.6 post transfusion, then to 9.6 prior to discharge.   Discharged home on Dec 14 with iron recommended  and plavix resumed .  Aspirin was stopped.    She has been managing her own medications since home and Has not started the iron supplement or the plavix .  Appetite is excellent .  Diarrhea has resolved.   Her appt with nephrology for iron infusion was postponed by daughter [name] due to daughter's illness.  Discussed reason for referral and ok with holding off on rescheduling unless oral iron does not improve. Her anemia  She continues to have hip and low back pain, managed with vicodin,  NSAIDs are C/I due to GI and CKD.   Lab Results  Component Value Date   CREATININE 1.42 (H) 04/08/2017     Outpatient Medications Prior to Visit  Medication Sig Dispense Refill  . albuterol (PROVENTIL HFA;VENTOLIN HFA) 108 (90 Base) MCG/ACT inhaler Inhale 2 puffs into the lungs every 6 (six) hours as needed for wheezing or shortness of breath. 1 Inhaler 2  . atorvastatin (LIPITOR) 40 MG tablet Take 1 tablet (40 mg total) by mouth daily at 6 PM. 90  tablet 1  . budesonide-formoterol (SYMBICORT) 80-4.5 MCG/ACT inhaler Inhale 2 puffs into the lungs 2 (two) times daily. 3 Inhaler 1  . clopidogrel (PLAVIX) 75 MG tablet Take 1 tablet (75 mg total) by mouth daily. 90 tablet 1  . diazepam (VALIUM) 5 MG tablet Take 1 tablet (5 mg total) by mouth 2 (two) times daily as needed. 60 tablet 3  . ferrous sulfate 325 (65 FE) MG tablet Take 1 tablet (325 mg total) by mouth daily with breakfast. 30 tablet 3  . lisinopril (PRINIVIL,ZESTRIL) 20 MG tablet TAKE 1 TABLET BY MOUTH EVERY DAY 90 tablet 1  . megestrol (MEGACE) 40 MG tablet TAKE 1 TABLET(40 MG) BY MOUTH DAILY 90 tablet 0  . metoprolol tartrate (LOPRESSOR) 25 MG tablet Take 0.5 tablets (12.5 mg total) by mouth 2 (two) times daily. 60 tablet 0  . mirtazapine (REMERON) 45 MG tablet Take 1 tablet (45 mg total) by mouth at bedtime. 90 tablet 1  . nitroGLYCERIN (NITROSTAT) 0.4 MG SL tablet Place 1 tablet (0.4 mg total) under the tongue every 5 (five) minutes x 3 doses as needed for chest pain. 90 tablet 1  . pantoprazole (PROTONIX) 40 MG tablet Take 1 tablet (40 mg total) by mouth 2 (two) times daily. 60 tablet 2  . PARoxetine (PAXIL) 30 MG tablet Take 1 tablet (30 mg total) by mouth daily. To be refilled by PCP 90 tablet 1  . PARoxetine (PAXIL) 30 MG tablet TAKE 1 TABLET BY MOUTH DAILY  30 tablet 5  . HYDROcodone-acetaminophen (NORCO/VICODIN) 5-325 MG tablet Take 1 tablet by mouth every 6 (six) hours as needed for moderate pain. Maximum 2 daily. 62 tablet 0   No facility-administered medications prior to visit.     Review of Systems;  Patient denies headache, fevers, malaise, unintentional weight loss, skin rash, eye pain, sinus congestion and sinus pain, sore throat, dysphagia,  hemoptysis , cough, dyspnea, wheezing, chest pain, palpitations, orthopnea, edema, abdominal pain, nausea, melena, diarrhea, constipation, flank pain, dysuria, hematuria, urinary  Frequency, nocturia, numbness, tingling, seizures,   Focal weakness, Loss of consciousness,  Tremor, insomnia, depression, anxiety, and suicidal ideation.      Objective:  BP 130/86 (BP Location: Left Arm, Patient Position: Sitting, Cuff Size: Normal)   Pulse 83   Temp 97.9 F (36.6 C) (Oral)   Resp 15   Ht 5\' 5"  (1.651 m)   Wt 124 lb 6.4 oz (56.4 kg)   SpO2 98%   BMI 20.70 kg/m   BP Readings from Last 3 Encounters:  04/08/17 130/86  04/01/17 (!) 150/49  01/28/17 122/72    Wt Readings from Last 3 Encounters:  04/08/17 124 lb 6.4 oz (56.4 kg)  03/29/17 117 lb (53.1 kg)  01/28/17 117 lb 6.4 oz (53.3 kg)    General appearance: alert, cooperative and appears stated age Ears: normal TM's and external ear canals both ears Throat: lips, mucosa, and tongue normal; teeth and gums normal Neck: no adenopathy, no carotid bruit, supple, symmetrical, trachea midline and thyroid not enlarged, symmetric, no tenderness/mass/nodules Back: symmetric, no curvature. ROM normal. No CVA tenderness. Lungs: clear to auscultation bilaterally Heart: regular rate and rhythm, S1, S2 normal, no murmur, click, rub or gallop Abdomen: soft, non-tender; bowel sounds normal; no masses,  no organomegaly Pulses: 2+ and symmetric Skin: Skin color, texture, turgor normal. No rashes or lesions Lymph nodes: Cervical, supraclavicular, and axillary nodes normal.  Lab Results  Component Value Date   HGBA1C 5.6 08/29/2016    Lab Results  Component Value Date   CREATININE 1.42 (H) 04/08/2017   CREATININE 1.43 (H) 03/30/2017   CREATININE 1.64 (H) 03/27/2017    Lab Results  Component Value Date   WBC 5.5 04/08/2017   HGB 9.0 (L) 04/08/2017   HCT 27.8 (L) 04/08/2017   PLT 203.0 04/08/2017   GLUCOSE 83 04/08/2017   CHOL 150 09/19/2016   TRIG 71.0 09/19/2016   HDL 39.90 09/19/2016   LDLDIRECT 75.0 09/19/2016   LDLCALC 95 09/19/2016   ALT 16 03/27/2017   AST 25 03/27/2017   NA 140 04/08/2017   K 4.2 04/08/2017   CL 108 04/08/2017   CREATININE 1.42 (H)  04/08/2017   BUN 21 04/08/2017   CO2 26 04/08/2017   TSH 0.44 11/04/2015   INR 1.01 03/28/2017   HGBA1C 5.6 08/29/2016    No results found.  Assessment & Plan:   Problem List Items Addressed This Visit    Anemia    Multifactorial,  Acute on chronic with GI bleed.  Discharge hgb was 9.6 after transfusion of 2 units for hgb 5.2 starting iron .  Recheck in 1 month  Lab Results  Component Value Date   WBC 5.5 04/08/2017   HGB 9.0 (L) 04/08/2017   HCT 27.8 (L) 04/08/2017   MCV 88.7 04/08/2017   PLT 203.0 04/08/2017         Relevant Orders   CBC with Differential/Platelet (Completed)   CAD (coronary artery disease)    S/p PTCA /stent in  May 2018.  Advised to resume Plavix asap.       GI bleed    Secondary to colonic angiectasia, s/p laser ablation during Dec 2018 hospitalization. S/p transfusion of 2 units.       Hospital discharge follow-up    Patient is stable post discharge and has no new issues or questions about discharge plans at the visit today for hospital follow up. All labs , imaging studies and progress notes from admission were reviewed with patient today        Vascular dementia with depressed mood    She is having trouble managing her medications.  Daughter [name] will assume responsibility       Other Visit Diagnoses    CKD (chronic kidney disease) stage 3, GFR 30-59 ml/min ([facility])    -  Primary   Relevant Orders   Basic metabolic panel (Completed)      I am having Bao R. Micucci maintain her albuterol, metoprolol tartrate, ferrous sulfate, nitroGLYCERIN, atorvastatin, budesonide-formoterol, mirtazapine, PARoxetine, clopidogrel, pantoprazole, lisinopril, diazepam, PARoxetine, megestrol, and HYDROcodone-acetaminophen.  Meds ordered this encounter  Medications  . DISCONTD: HYDROcodone-acetaminophen (NORCO/VICODIN) 5-325 MG tablet    Sig: Take 1 tablet by mouth every 6 (six) hours as needed for moderate pain. Maximum 2 daily.    Dispense:  62 tablet     Refill:  0  . DISCONTD: HYDROcodone-acetaminophen (NORCO/VICODIN) 5-325 MG tablet    Sig: Take 1 tablet by mouth every 6 (six) hours as needed for moderate pain. Maximum 2 daily.    Dispense:  62 tablet    Refill:  0    May refill on or after May 10 2017  . HYDROcodone-acetaminophen (NORCO/VICODIN) 5-325 MG tablet    Sig: Take 1 tablet by mouth every 6 (six) hours as needed for moderate pain. Maximum 2 daily.    Dispense:  62 tablet    Refill:  0    May refill on or after June 10 2017    Medications Discontinued During This Encounter  Medication Reason  . HYDROcodone-acetaminophen (NORCO/VICODIN) 5-325 MG tablet Reorder  . HYDROcodone-acetaminophen (NORCO/VICODIN) 5-325 MG tablet Reorder  . HYDROcodone-acetaminophen (NORCO/VICODIN) 5-325 MG tablet Reorder    Follow-up: Return in about 3 months (around 07/07/2017) for medication refill in early march .   Crecencio Mc, MD

## 2017-04-08 NOTE — Assessment & Plan Note (Signed)
Secondary to colonic angiectasia, s/p laser ablation during Dec 2018 hospitalization. S/p transfusion of 2 units.

## 2017-04-23 DIAGNOSIS — J449 Chronic obstructive pulmonary disease, unspecified: Secondary | ICD-10-CM | POA: Diagnosis not present

## 2017-04-23 DIAGNOSIS — J9611 Chronic respiratory failure with hypoxia: Secondary | ICD-10-CM | POA: Diagnosis not present

## 2017-04-25 ENCOUNTER — Other Ambulatory Visit (INDEPENDENT_AMBULATORY_CARE_PROVIDER_SITE_OTHER): Payer: Medicare Other

## 2017-04-25 DIAGNOSIS — D500 Iron deficiency anemia secondary to blood loss (chronic): Secondary | ICD-10-CM

## 2017-04-25 NOTE — Addendum Note (Signed)
Addended by: Arby Barrette on: 04/25/2017 10:48 AM   Modules accepted: Orders

## 2017-04-26 LAB — CBC WITH DIFFERENTIAL/PLATELET
BASOS PCT: 0.4 %
Basophils Absolute: 19 cells/uL (ref 0–200)
EOS PCT: 5.1 %
Eosinophils Absolute: 240 cells/uL (ref 15–500)
HCT: 27.4 % — ABNORMAL LOW (ref 35.0–45.0)
Hemoglobin: 8.8 g/dL — ABNORMAL LOW (ref 11.7–15.5)
Lymphs Abs: 776 cells/uL — ABNORMAL LOW (ref 850–3900)
MCH: 27.7 pg (ref 27.0–33.0)
MCHC: 32.1 g/dL (ref 32.0–36.0)
MCV: 86.2 fL (ref 80.0–100.0)
MONOS PCT: 7.5 %
MPV: 11.3 fL (ref 7.5–12.5)
NEUTROS PCT: 70.5 %
Neutro Abs: 3314 cells/uL (ref 1500–7800)
PLATELETS: 198 10*3/uL (ref 140–400)
RBC: 3.18 10*6/uL — AB (ref 3.80–5.10)
RDW: 13.5 % (ref 11.0–15.0)
Total Lymphocyte: 16.5 %
WBC: 4.7 10*3/uL (ref 3.8–10.8)
WBCMIX: 353 {cells}/uL (ref 200–950)

## 2017-04-26 LAB — IRON,TIBC AND FERRITIN PANEL
%SAT: 9 % (calc) — ABNORMAL LOW (ref 11–50)
Ferritin: 27 ng/mL (ref 20–288)
IRON: 29 ug/dL — AB (ref 45–160)
TIBC: 313 ug/dL (ref 250–450)

## 2017-04-30 ENCOUNTER — Other Ambulatory Visit: Payer: Self-pay | Admitting: Internal Medicine

## 2017-04-30 DIAGNOSIS — D500 Iron deficiency anemia secondary to blood loss (chronic): Secondary | ICD-10-CM

## 2017-05-05 NOTE — Progress Notes (Signed)
[place]  [phone] [phone]) [phone]  ID: [name] OB: 10-17-1931  MR#: [id number]  FMB#:[id number]  Patient Care Team: Crecencio Mc, MD as PCP - General (Internal Medicine) Lin Landsman, MD as Consulting Physician (Gastroenterology) Stanford Breed Denice Bors, MD as Consulting Physician (Cardiology)  CHIEF COMPLAINT: Iron deficiency anemia.  INTERVAL HISTORY: Patient is an 84 year old female have significantly decreased hemoglobin and iron stores.  She has increased weakness and fatigue, but otherwise feels well.  She has no neurologic complaints.  She denies any recent fevers or illnesses.  She has a good appetite and denies weight loss.  She has no chest pain or shortness of breath.  She denies any nausea, vomiting, constipation, or diarrhea.  She has no melena or hematochezia.  She has no urinary complaints.  Patient offers no further specific complaints today.  REVIEW OF SYSTEMS:   Review of Systems  Constitutional: Positive for malaise/fatigue. Negative for fever and weight loss.  Respiratory: Negative.  Negative for cough and shortness of breath.   Cardiovascular: Negative.  Negative for chest pain and leg swelling.  Gastrointestinal: Negative.  Negative for abdominal pain, blood in stool and melena.  Genitourinary: Negative.  Negative for hematuria.  Musculoskeletal: Negative.   Skin: Negative.  Negative for rash.  Neurological: Positive for weakness. Negative for sensory change and focal weakness.  Psychiatric/Behavioral: Negative.  The patient is not nervous/anxious.     As per HPI. Otherwise, a complete review of systems is negative.  PAST MEDICAL HISTORY: Past Medical History:  Diagnosis Date  . AAA (abdominal aortic aneurysm) ([place])   . Anxiety   . Aortic aneurysm ([place])   . Arthritis   . Barrett's esophagus 2007  . Bursitis 2012   left hip, improved with periodic steroid injection [facility] [place])  . COPD (chronic  obstructive pulmonary disease) ([place])   . Coronary artery disease    a. LHC (08/27/16): 15% ostial LM, 99% pLAD and D1, Ramus & LCx nl, m/dRCA diffuse 80-90%, PDA occluded proximally, fills via L-R collats, EF 50-55%; b. PCI (08/31/16): Severe pLAD dz involving D1, successful orbital atherectomy PCI to pLAD w/ Synergy 3.0 x 20 mm DES, 0% residual stenosis & TIMI 3 flow, occluded ostD1, medically managed given lack of CP  . Depression   . Diastolic dysfunction    a. TTE (08/25/16): Mild LVH with LVEF of 50-55% and grade 1 diastolic dysfunction. Mild MR. Mild right atrial enlargement. Normal RV size and function  . GERD (gastroesophageal reflux disease)    with Barretts Esophagus  . GI bleeding    a. required pRBC May, July, and December 2018  . Hyperlipidemia   . Hypertension   . Iron deficiency anemia 2012  . Lumbago   . Macular degeneration   . Osteoporosis   . Sigmoid diverticulosis    by colonoscopy  . Stroke [facility])     PAST SURGICAL HISTORY: Past Surgical History:  Procedure Laterality Date  . ABDOMINAL AORTIC ANEURYSM REPAIR  July 2010   [facility]  . APPENDECTOMY    . BACK SURGERY     X2..1975 arachnoid cyst cervical region([place]);1997 lumbosacral tumor,9 hr surgery  . CARDIAC CATHETERIZATION    . CARDIAC CATHETERIZATION    . cardiac stents    . COLONOSCOPY N/A 03/30/2017   Procedure: COLONOSCOPY;  Surgeon: Lin Landsman, MD;  Location: [facility] ENDOSCOPY;  Service: Gastroenterology;  Laterality: N/A;  . CORONARY ATHERECTOMY N/A 08/31/2016   Procedure: Coronary Atherectomy;  Surgeon: Nelva [place],  MD;  Location: [place] CV LAB;  Service: Cardiovascular;  Laterality: N/A;  . ESOPHAGOGASTRODUODENOSCOPY N/A 03/29/2017   Procedure: ESOPHAGOGASTRODUODENOSCOPY (EGD);  Surgeon: Virgel Manifold, MD;  Location: [facility] ENDOSCOPY;  Service: Endoscopy;  Laterality: N/A;  . ESOPHAGOGASTRODUODENOSCOPY (EGD) WITH PROPOFOL N/A 09/02/2016   Procedure: ESOPHAGOGASTRODUODENOSCOPY (EGD)  WITH PROPOFOL;  Surgeon: Otis Brace, MD;  Location: [facility] ENDOSCOPY;  Service: Gastroenterology;  Laterality: N/A;  . ESOPHAGOGASTRODUODENOSCOPY (EGD) WITH PROPOFOL N/A 11/06/2016   Procedure: ESOPHAGOGASTRODUODENOSCOPY (EGD) WITH PROPOFOL;  Surgeon: Lucilla Lame, MD;  Location: [facility] ENDOSCOPY;  Service: Endoscopy;  Laterality: N/A;  . FLEXIBLE SIGMOIDOSCOPY N/A 03/29/2017   Procedure: FLEXIBLE SIGMOIDOSCOPY;  Surgeon: Virgel Manifold, MD;  Location: [facility] ENDOSCOPY;  Service: Endoscopy;  Laterality: N/A;  . INTRAVASCULAR ULTRASOUND/IVUS N/A 08/31/2016   Procedure: Intravascular Ultrasound/IVUS;  Surgeon: Nelva [place], MD;  Location: [place] CV LAB;  Service: Cardiovascular;  Laterality: N/A;  . LEFT HEART CATH AND CORONARY ANGIOGRAPHY N/A 08/27/2016   Procedure: Left Heart Cath and Coronary Angiography;  Surgeon: Teodoro Spray, MD;  Location: [place] CV LAB;  Service: Cardiovascular;  Laterality: N/A;    FAMILY HISTORY: Family History  Problem Relation Age of Onset  . Coronary artery disease Mother   . Alzheimer's disease Mother   . Coronary artery disease Father 33  . Heart attack Father   . Throat cancer Brother   . Lung cancer Maternal Aunt   . Kidney cancer Daughter   . Cervical cancer Daughter     ADVANCED DIRECTIVES (Y/N):  N  HEALTH MAINTENANCE: Social History   Tobacco Use  . Smoking status: Current Every Day Smoker    Packs/day: 0.50    Types: Cigarettes  . Smokeless tobacco: Never Used  . Tobacco comment: HAS BEEN SMOKING 30+ YEARS,RESUMED TOBACCO USE AFTER AAA REPAIR  Substance Use Topics  . Alcohol use: No  . Drug use: No     Colonoscopy:  PAP:  Bone density:  Lipid panel:  Allergies  Allergen Reactions  . Morphine Sulfate Other (See Comments)    Reaction:  Hallucinations     Current Outpatient Medications  Medication Sig Dispense Refill  . albuterol (PROVENTIL HFA;VENTOLIN HFA) 108 (90 Base) MCG/ACT inhaler Inhale 2 puffs into the  lungs every 6 (six) hours as needed for wheezing or shortness of breath. 1 Inhaler 2  . atorvastatin (LIPITOR) 40 MG tablet Take 1 tablet (40 mg total) by mouth daily at 6 PM. 90 tablet 1  . budesonide-formoterol (SYMBICORT) 80-4.5 MCG/ACT inhaler Inhale 2 puffs into the lungs 2 (two) times daily. 3 Inhaler 1  . clopidogrel (PLAVIX) 75 MG tablet Take 1 tablet (75 mg total) by mouth daily. 90 tablet 1  . diazepam (VALIUM) 5 MG tablet Take 1 tablet (5 mg total) by mouth 2 (two) times daily as needed. 60 tablet 3  . ferrous sulfate 325 (65 FE) MG tablet Take 1 tablet (325 mg total) by mouth daily with breakfast. 30 tablet 3  . lisinopril (PRINIVIL,ZESTRIL) 20 MG tablet TAKE 1 TABLET BY MOUTH EVERY DAY 90 tablet 1  . megestrol (MEGACE) 40 MG tablet TAKE 1 TABLET(40 MG) BY MOUTH DAILY 90 tablet 0  . metoprolol tartrate (LOPRESSOR) 25 MG tablet Take 0.5 tablets (12.5 mg total) by mouth 2 (two) times daily. 60 tablet 0  . mirtazapine (REMERON) 45 MG tablet Take 1 tablet (45 mg total) by mouth at bedtime. 90 tablet 1  . nitroGLYCERIN (NITROSTAT) 0.4 MG SL tablet Place 1 tablet (0.4 mg total)  under the tongue every 5 (five) minutes x 3 doses as needed for chest pain. 90 tablet 1  . pantoprazole (PROTONIX) 40 MG tablet Take 1 tablet (40 mg total) by mouth 2 (two) times daily. 60 tablet 2  . PARoxetine (PAXIL) 30 MG tablet Take 1 tablet (30 mg total) by mouth daily. To be refilled by PCP 90 tablet 1  . PARoxetine (PAXIL) 30 MG tablet TAKE 1 TABLET BY MOUTH DAILY 30 tablet 5  . HYDROcodone-acetaminophen (NORCO/VICODIN) 5-325 MG tablet Take 1 tablet by mouth every 6 (six) hours as needed for moderate pain. Maximum 2 daily. (Patient not taking: Reported on 05/08/2017) 62 tablet 0   No current facility-administered medications for this visit.     OBJECTIVE: Vitals:   05/08/17 1107  BP: 115/77  Pulse: 76  Resp: 18  Temp: (!) 97.2 F (36.2 C)     Body mass index is 18.94 kg/m.    ECOG FS:0 -  Asymptomatic  General: Well-developed, well-nourished, no acute distress. Eyes: Pink conjunctiva, anicteric sclera. HEENT: Normocephalic, moist mucous membranes, clear oropharnyx. Lungs: Clear to auscultation bilaterally. Heart: Regular rate and rhythm. No rubs, murmurs, or gallops. Abdomen: Soft, nontender, nondistended. No organomegaly noted, normoactive bowel sounds. Musculoskeletal: No edema, cyanosis, or clubbing. Neuro: Alert, answering all questions appropriately. Cranial nerves grossly intact. Skin: No rashes or petechiae noted. Psych: Normal affect. Lymphatics: No cervical, calvicular, axillary or inguinal LAD.   LAB RESULTS:  Lab Results  Component Value Date   NA 140 04/08/2017   K 4.2 04/08/2017   CL 108 04/08/2017   CO2 26 04/08/2017   GLUCOSE 83 04/08/2017   BUN 21 04/08/2017   CREATININE 1.42 (H) 04/08/2017   CALCIUM 8.8 04/08/2017   PROT 5.6 (L) 03/27/2017   ALBUMIN 3.0 (L) 03/27/2017   AST 25 03/27/2017   ALT 16 03/27/2017   ALKPHOS 66 03/27/2017   BILITOT 0.8 03/27/2017   GFRNONAA 33 (L) 03/30/2017   GFRAA 38 (L) 03/30/2017    Lab Results  Component Value Date   WBC 4.7 04/25/2017   NEUTROABS 3,314 04/25/2017   HGB 8.8 (L) 04/25/2017   HCT 27.4 (L) 04/25/2017   MCV 86.2 04/25/2017   PLT 198 04/25/2017   Lab Results  Component Value Date   IRON 29 (L) 04/25/2017   TIBC 313 04/25/2017   IRONPCTSAT 9 (L) 04/25/2017   Lab Results  Component Value Date   FERRITIN 27 04/25/2017     STUDIES: No results found.  ASSESSMENT: Iron deficiency anemia.  PLAN:    1. Iron deficiency anemia: Patient's hemoglobin and iron stores are significantly reduced.  She is also symptomatic.  Proceed with 510 mg IV Feraheme today.  Patient will then return to clinic in 1 week for second infusion.  She has been instructed to continue her oral iron supplementation.  If her hemoglobin and symptoms do not improve with iron infusions, will initiate a full workup in  the future.  Return to clinic in 2 months with repeat laboratory work and further evaluation. 2.  Renal insufficiency: Patient's creatinine appears to be approximately her baseline.  She may benefit from Procrit in the future if her hemoglobin remains below 10.0 and her iron stores have normalized.  Approximately 45 minutes was spent in discussion of which greater than 50% was consultation.  Patient expressed understanding and was in agreement with this plan. She also understands that She can call clinic at any time with any questions, concerns, or complaints.    Christia Reading  Bridget Hartshorn, MD   05/12/2017 7:36 AM

## 2017-05-08 ENCOUNTER — Inpatient Hospital Stay: Payer: Medicare Other

## 2017-05-08 ENCOUNTER — Inpatient Hospital Stay: Payer: Medicare Other | Attending: Oncology | Admitting: Oncology

## 2017-05-08 ENCOUNTER — Encounter: Payer: Self-pay | Admitting: Oncology

## 2017-05-08 VITALS — BP 133/76 | HR 70 | Resp 18

## 2017-05-08 VITALS — BP 115/77 | HR 76 | Temp 97.2°F | Resp 18 | Wt 113.8 lb

## 2017-05-08 DIAGNOSIS — D509 Iron deficiency anemia, unspecified: Secondary | ICD-10-CM | POA: Diagnosis not present

## 2017-05-08 DIAGNOSIS — D500 Iron deficiency anemia secondary to blood loss (chronic): Secondary | ICD-10-CM

## 2017-05-08 DIAGNOSIS — N289 Disorder of kidney and ureter, unspecified: Secondary | ICD-10-CM | POA: Diagnosis not present

## 2017-05-08 MED ORDER — SODIUM CHLORIDE 0.9 % IV SOLN
Freq: Once | INTRAVENOUS | Status: AC
  Administered 2017-05-08: 12:00:00 via INTRAVENOUS
  Filled 2017-05-08: qty 1000

## 2017-05-08 MED ORDER — SODIUM CHLORIDE 0.9 % IV SOLN
510.0000 mg | Freq: Once | INTRAVENOUS | Status: AC
  Administered 2017-05-08: 510 mg via INTRAVENOUS
  Filled 2017-05-08: qty 17

## 2017-05-13 ENCOUNTER — Ambulatory Visit: Payer: Medicare Other | Admitting: Gastroenterology

## 2017-05-13 ENCOUNTER — Inpatient Hospital Stay: Payer: Medicare Other | Admitting: Internal Medicine

## 2017-05-15 ENCOUNTER — Inpatient Hospital Stay: Payer: Medicare Other

## 2017-05-15 VITALS — BP 136/79 | HR 79 | Temp 98.0°F | Resp 18

## 2017-05-15 DIAGNOSIS — D500 Iron deficiency anemia secondary to blood loss (chronic): Secondary | ICD-10-CM

## 2017-05-15 DIAGNOSIS — D509 Iron deficiency anemia, unspecified: Secondary | ICD-10-CM | POA: Diagnosis not present

## 2017-05-15 DIAGNOSIS — N289 Disorder of kidney and ureter, unspecified: Secondary | ICD-10-CM | POA: Diagnosis not present

## 2017-05-15 MED ORDER — SODIUM CHLORIDE 0.9 % IV SOLN
510.0000 mg | Freq: Once | INTRAVENOUS | Status: AC
  Administered 2017-05-15: 510 mg via INTRAVENOUS
  Filled 2017-05-15: qty 17

## 2017-05-15 MED ORDER — SODIUM CHLORIDE 0.9 % IV SOLN
Freq: Once | INTRAVENOUS | Status: AC
  Administered 2017-05-15: 14:00:00 via INTRAVENOUS
  Filled 2017-05-15: qty 1000

## 2017-05-17 ENCOUNTER — Other Ambulatory Visit: Payer: Self-pay | Admitting: Internal Medicine

## 2017-05-24 DIAGNOSIS — J9611 Chronic respiratory failure with hypoxia: Secondary | ICD-10-CM | POA: Diagnosis not present

## 2017-05-24 DIAGNOSIS — J449 Chronic obstructive pulmonary disease, unspecified: Secondary | ICD-10-CM | POA: Diagnosis not present

## 2017-05-27 ENCOUNTER — Emergency Department
Admission: EM | Admit: 2017-05-27 | Discharge: 2017-05-27 | Disposition: A | Discharge status: Home / Self Care | Payer: Medicare Other | Attending: Emergency Medicine | Admitting: Emergency Medicine

## 2017-05-27 ENCOUNTER — Ambulatory Visit: Payer: Self-pay | Admitting: *Deleted

## 2017-05-27 ENCOUNTER — Other Ambulatory Visit: Payer: Self-pay

## 2017-05-27 DIAGNOSIS — F17210 Nicotine dependence, cigarettes, uncomplicated: Secondary | ICD-10-CM | POA: Insufficient documentation

## 2017-05-27 DIAGNOSIS — I119 Hypertensive heart disease without heart failure: Secondary | ICD-10-CM | POA: Diagnosis not present

## 2017-05-27 DIAGNOSIS — Z79899 Other long term (current) drug therapy: Secondary | ICD-10-CM | POA: Diagnosis not present

## 2017-05-27 DIAGNOSIS — R1030 Lower abdominal pain, unspecified: Secondary | ICD-10-CM | POA: Diagnosis not present

## 2017-05-27 DIAGNOSIS — E860 Dehydration: Secondary | ICD-10-CM | POA: Diagnosis not present

## 2017-05-27 DIAGNOSIS — I2510 Atherosclerotic heart disease of native coronary artery without angina pectoris: Secondary | ICD-10-CM | POA: Diagnosis not present

## 2017-05-27 DIAGNOSIS — R197 Diarrhea, unspecified: Secondary | ICD-10-CM | POA: Insufficient documentation

## 2017-05-27 DIAGNOSIS — J449 Chronic obstructive pulmonary disease, unspecified: Secondary | ICD-10-CM | POA: Insufficient documentation

## 2017-05-27 LAB — COMPREHENSIVE METABOLIC PANEL
ALBUMIN: 3.2 g/dL — AB (ref 3.5–5.0)
ALK PHOS: 77 U/L (ref 38–126)
ALT: 13 U/L — AB (ref 14–54)
AST: 19 U/L (ref 15–41)
Anion gap: 7 (ref 5–15)
BILIRUBIN TOTAL: 0.2 mg/dL — AB (ref 0.3–1.2)
BUN: 29 mg/dL — ABNORMAL HIGH (ref 6–20)
CALCIUM: 8.2 mg/dL — AB (ref 8.9–10.3)
CO2: 24 mmol/L (ref 22–32)
CREATININE: 1.44 mg/dL — AB (ref 0.44–1.00)
Chloride: 107 mmol/L (ref 101–111)
GFR calc Af Amer: 38 mL/min — ABNORMAL LOW (ref >60)
GFR calc non Af Amer: 33 mL/min — ABNORMAL LOW (ref >60)
GLUCOSE: 113 mg/dL — AB (ref 65–99)
Potassium: 3.8 mmol/L (ref 3.5–5.1)
SODIUM: 138 mmol/L (ref 135–145)
TOTAL PROTEIN: 5.8 g/dL — AB (ref 6.5–8.1)

## 2017-05-27 LAB — LIPASE, BLOOD: Lipase: 35 U/L (ref 11–51)

## 2017-05-27 LAB — CBC WITH DIFFERENTIAL/PLATELET
Basophils Absolute: 0 10*3/uL (ref 0–0.1)
Basophils Relative: 0 %
Eosinophils Absolute: 0.1 10*3/uL (ref 0–0.7)
Eosinophils Relative: 2 %
HEMATOCRIT: 29.1 % — AB (ref 35.0–47.0)
HEMOGLOBIN: 9.5 g/dL — AB (ref 12.0–16.0)
LYMPHS ABS: 0.5 10*3/uL — AB (ref 1.0–3.6)
Lymphocytes Relative: 7 %
MCH: 29.5 pg (ref 26.0–34.0)
MCHC: 32.5 g/dL (ref 32.0–36.0)
MCV: 90.8 fL (ref 80.0–100.0)
MONOS PCT: 7 %
Monocytes Absolute: 0.4 10*3/uL (ref 0.2–0.9)
NEUTROS ABS: 5.4 10*3/uL (ref 1.4–6.5)
NEUTROS PCT: 84 %
Platelets: 154 10*3/uL (ref 150–440)
RBC: 3.21 MIL/uL — AB (ref 3.80–5.20)
RDW: 20.3 % — ABNORMAL HIGH (ref 11.5–14.5)
WBC: 6.5 10*3/uL (ref 3.6–11.0)

## 2017-05-27 LAB — CBC
HCT: 31 % — ABNORMAL LOW (ref 35.0–47.0)
Hemoglobin: 10.1 g/dL — ABNORMAL LOW (ref 12.0–16.0)
MCH: 29.6 pg (ref 26.0–34.0)
MCHC: 32.6 g/dL (ref 32.0–36.0)
MCV: 90.8 fL (ref 80.0–100.0)
PLATELETS: 134 10*3/uL — AB (ref 150–440)
RBC: 3.42 MIL/uL — ABNORMAL LOW (ref 3.80–5.20)
RDW: 20.3 % — AB (ref 11.5–14.5)
WBC: 6.3 10*3/uL (ref 3.6–11.0)

## 2017-05-27 NOTE — Discharge Instructions (Signed)
Please seek medical attention for any high fevers, chest pain, shortness of breath, change in behavior, persistent vomiting, bloody stool or any other new or concerning symptoms.  

## 2017-05-27 NOTE — Telephone Encounter (Signed)
Pt  Has  History  Of  Anemia    And  Has  Had too many to count watery  Diarrhea   Episodes  Today   According to  Daughter  - pts  Mouth is  Dry  And  She  Feels  Weak   Worse  When she  Stands  She  Reports   Some dull l  Sided  Abdominal pain  As  Well .  Pt  Advised  To go  To  [facility]  Daughter  Will call [phone]  She  States .      Reason for Disposition . Patient sounds very sick or weak to the triager . [1] SEVERE diarrhea (e.g., 7 or more times / day more than normal) AND [2]  age > 60 years  Answer Assessment - Initial Assessment Questions 1. DIARRHEA SEVERITY: "How bad is the diarrhea?" "How many extra stools have you had in the past 24 hours than normal?"    - MILD: Few loose or mushy BMs; increase of 1-3 stools over normal daily number of stools; mild increase in ostomy output.   - MODERATE: Increase of 4-6 stools daily over normal; moderate increase in ostomy output.   - SEVERE (or Worst Possible): Increase of 7 or more stools daily over normal; moderate increase in ostomy output; incontinence.    severe 2. ONSET: "When did the diarrhea begin?"       4am    3. BM CONSISTENCY: "How loose or watery is the diarrhea?"       Watery  Foul odor   4. VOMITING: "Are you also vomiting?" If so, ask: "How many times in the past 24 hours?"      no 5. ABDOMINAL PAIN: "Are you having any abdominal pain?" If yes: "What does it feel like?" (e.g., crampy, dull, intermittent, constant)      Dull  Ache   6. ABDOMINAL PAIN SEVERITY: If present, ask: "How bad is the pain?"  (e.g., Scale 1-10; mild, moderate, or severe)    - MILD (1-3): doesn't interfere with normal activities, abdomen soft and not tender to touch     - MODERATE (4-7): interferes with normal activities or awakens from sleep, tender to touch     - SEVERE (8-10): excruciating pain, doubled over, unable to do any normal activities         Tender   To  Touch  l  Side  Appears   To  Feel  Hard  On palpation   7. ORAL INTAKE: If vomiting, "Have you  been able to drink liquids?" "How much fluids have you had in the past 24 hours?"        Taking   24 oz    8  Hours    8. HYDRATION: "Any signs of dehydration?" (e.g., dry mouth [not just dry lips], too weak to stand, dizziness, new weight loss) "When did you last urinate?"     Lips  And  Tongue   Weak   When  Stands    15  -20  mins  Ago   9. EXPOSURE: "Have you traveled to a foreign country recently?" "Have you been exposed to anyone with diarrhea?" "Could you have eaten any food that was spoiled?"      No  10. OTHER SYMPTOMS: "Do you have any other symptoms?" (e.g., fever, blood in stool)       Cool  And  Clammy    11. PREGNANCY: "Is there any  chance you are pregnant?" "When was your last menstrual period?"       n/a  Protocols used: DIARRHEA-A-AH

## 2017-05-27 NOTE — ED Notes (Signed)
Pt called out requesting food. EDP agreed sandwich tray given at this time.

## 2017-05-27 NOTE — ED Provider Notes (Signed)
[facility] Emergency Department Provider Note _____________________________________   I have reviewed the triage vital signs and the nursing notes.   HISTORY  Chief Complaint Diarrhea   History limited by: Not Limited   HPI Anna Prince is a 84 y.o. female who presents to the emergency department today because of concerns for diarrhea.  It started roughly 24 hours ago.  Has been frequent.  She has had over a dozen episodes.  She has had some associated lower abdominal discomfort with the diarrhea.  Has noticed some occasional blackness to her diarrhea.  States she has a history of GI bleed in the past secondary to ulcer found on endoscopy.  Patient has had some associated weakness.  She denies any blood thinner use.   Per medical record review patient has a history of AAA, GI bleed.  Past Medical History:  Diagnosis Date  . AAA (abdominal aortic aneurysm) ([place])   . Anxiety   . Aortic aneurysm ([place])   . Arthritis   . Barrett's esophagus 2007  . Bursitis 2012   left hip, improved with periodic steroid injection [facility] Tom Bush)  . COPD (chronic obstructive pulmonary disease) ([place])   . Coronary artery disease    a. LHC (08/27/16): 15% ostial LM, 99% pLAD and D1, Ramus & LCx nl, m/dRCA diffuse 80-90%, PDA occluded proximally, fills via L-R collats, EF 50-55%; b. PCI (08/31/16): Severe pLAD dz involving D1, successful orbital atherectomy PCI to pLAD w/ Synergy 3.0 x 20 mm DES, 0% residual stenosis & TIMI 3 flow, occluded ostD1, medically managed given lack of CP  . Depression   . Diastolic dysfunction    a. TTE (08/25/16): Mild LVH with LVEF of 50-55% and grade 1 diastolic dysfunction. Mild MR. Mild right atrial enlargement. Normal RV size and function  . GERD (gastroesophageal reflux disease)    with Barretts Esophagus  . GI bleeding    a. required pRBC May, July, and December 2018  . Hyperlipidemia   . Hypertension   . Iron deficiency anemia 2012  .  Lumbago   . Macular degeneration   . Osteoporosis   . Sigmoid diverticulosis    by colonoscopy  . Stroke [facility])     Patient Active Problem List   Diagnosis Date Noted  . Acute peptic ulcer of stomach   . Hiatal hernia   . Diverticulosis of colon without diverticulitis   . GI bleed 03/26/2017  . GERD (gastroesophageal reflux disease) 11/05/2016  . CAD (coronary artery disease) 08/28/2016  . Non-ST elevation (NSTEMI) myocardial infarction ([place]) 08/24/2016  . Chronic respiratory failure with hypoxia ([place]) 05/17/2016  . Generalized muscle weakness 04/16/2015  . Hospital discharge follow-up 04/16/2015  . Coronary artery disease due to lipid rich plaque 04/14/2015  . Anxiety 04/14/2015  . Bursitis of left hip 11/27/2014  . Iron deficiency anemia 11/25/2014  . Elevated liver enzymes 08/25/2014  . History of fracture of pelvis or lower extremity 08/25/2014  . Vascular dementia with depressed mood 02/20/2014  . Chronic pain syndrome 05/27/2013  . Tobacco abuse counseling 04/10/2013  . Gastric ulcer requiring drug therapy   . Screening for colon cancer 06/18/2011  . COPD (chronic obstructive pulmonary disease) ([place]) 06/18/2011  . Skin cancer of nose 12/17/2010  . Vitamin D deficiency 12/17/2010  . Hyperlipidemia LDL goal <100 12/17/2010  . Screening for malignant neoplasm of breast 12/17/2010  . Encounter for long-term (current) use of other medications 12/17/2010  . S/P abdominal aortic aneurysm repair 12/17/2010  .  Diverticulosis, sigmoid 12/08/2010  . Hyperlipidemia 12/08/2010  . Hypertension 12/08/2010  . Lumbago 12/08/2010  . Osteoporosis 12/08/2010  . Major depressive disorder, recurrent episode, moderate ([place]) 12/08/2010    Past Surgical History:  Procedure Laterality Date  . ABDOMINAL AORTIC ANEURYSM REPAIR  July 2010   [facility]  . APPENDECTOMY    . BACK SURGERY     X2..1975 arachnoid cyst cervical region(blumquist,gso);1997 lumbosacral tumor,9 hr surgery  . CARDIAC  CATHETERIZATION    . CARDIAC CATHETERIZATION    . cardiac stents    . COLONOSCOPY N/A 03/30/2017   Procedure: COLONOSCOPY;  Surgeon: Lin Landsman, MD;  Location: [facility] ENDOSCOPY;  Service: Gastroenterology;  Laterality: N/A;  . CORONARY ATHERECTOMY N/A 08/31/2016   Procedure: Coronary Atherectomy;  Surgeon: Nelva Bush, MD;  Location: [place] CV LAB;  Service: Cardiovascular;  Laterality: N/A;  . ESOPHAGOGASTRODUODENOSCOPY N/A 03/29/2017   Procedure: ESOPHAGOGASTRODUODENOSCOPY (EGD);  Surgeon: Virgel Manifold, MD;  Location[facility] END[facility]COPY;  Service: Endoscopy;  Laterality: N/A;  . ESOPHAGOGASTRODUODENOSCOPY (EGD) WITH PROPOFOL N/A 09/02/2016   Procedure: ESOPHAGOGASTRODUODENOSCOPY (EGD) WITH PROPOFOL;  Surgeon: Otis Brace, MD;  Location: [facility] ENDOSCOPY;  Service: Gastroenterology;  Laterality: N/A;  . ESOPHAGOGASTRODUODENOSCOPY (EGD) WITH PROPOFOL N/A 11/06/2016   Procedure: ESOPHAGOGASTRODUODENOSCOPY (EGD) WITH PROPOFOL;  Surgeon: Lucilla Lame, MD;  Location: [facility] ENDOSCOPY;  Service: Endoscopy;  Laterality: N/A;  . FLEXIBLE SIGMOIDOSCOPY N/A 03/29/2017   Procedure: FLEXIBLE SIGMOIDOSCOPY;  Surgeon: Virgel Manifold, MD;  Location: [facility] ENDOSCOPY;  Service: Endoscopy;  Laterality: N/A;  . INTRAVASCULAR ULTRASOUND/IVUS N/A 08/31/2016   Procedure: Intravascular Ultrasound/IVUS;  Surgeon: Nelva Bush, MD;  Location: [place] CV LAB;  Service: Cardiovascular;  Laterality: N/A;  . LEFT HEART CATH AND CORONARY ANGIOGRAPHY N/A 08/27/2016   Procedure: Left Heart Cath and Coronary Angiography;  Surgeon: Teodoro Spray, MD;  Location: [place] CV LAB;  Service: Cardiovascular;  Laterality: N/A;    Prior to Admission medications   Medication Sig Start Date End Date Taking? Authorizing Provider  albuterol (PROVENTIL HFA;VENTOLIN HFA) 108 (90 Base) MCG/ACT inhaler Inhale 2 puffs into the lungs every 6 (six) hours as needed for wheezing or shortness of breath. 11/11/15    Alfred Levins, [place], MD  atorvastatin (LIPITOR) 40 MG tablet Take 1 tablet (40 mg total) by mouth daily at 6 PM. 10/08/16   Crecencio Mc, MD  budesonide-formoterol (SYMBICORT) 80-4.5 MCG/ACT inhaler Inhale 2 puffs into the lungs 2 (two) times daily. 10/08/16   Crecencio Mc, MD  clopidogrel (PLAVIX) 75 MG tablet Take 1 tablet (75 mg total) by mouth daily. 10/08/16   Crecencio Mc, MD  diazepam (VALIUM) 5 MG tablet Take 1 tablet (5 mg total) by mouth 2 (two) times daily as needed. 01/28/17   Crecencio Mc, MD  ferrous sulfate 325 (65 FE) MG tablet Take 1 tablet (325 mg total) by mouth daily with breakfast. 09/03/16   Lendon Colonel, NP  HYDROcodone-acetaminophen (NORCO/VICODIN) 5-325 MG tablet Take 1 tablet by mouth every 6 (six) hours as needed for moderate pain. Maximum 2 daily. Patient not taking: Reported on 05/08/2017 04/08/17   Crecencio Mc, MD  lisinopril (PRINIVIL,ZESTRIL) 20 MG tablet TAKE 1 TABLET BY MOUTH EVERY DAY 01/11/17   Crecencio Mc, MD  megestrol (MEGACE) 40 MG tablet TAKE 1 TABLET(40 MG) BY MOUTH DAILY 02/06/17   Crecencio Mc, MD  metoprolol tartrate (LOPRESSOR) 25 MG tablet Take 0.5 tablets (12.5 mg total) by mouth 2 (two) times daily. 08/27/16   Bettey Costa, MD  mirtazapine (REMERON) 45 MG tablet Take 1 tablet (45 mg total) by mouth at bedtime. 10/08/16   Crecencio Mc, MD  nitroGLYCERIN (NITROSTAT) 0.4 MG SL tablet Place 1 tablet (0.4 mg total) under the tongue every 5 (five) minutes x 3 doses as needed for chest pain. 09/28/16   Crecencio Mc, MD  pantoprazole (PROTONIX) 40 MG tablet Take 1 tablet (40 mg total) by mouth 2 (two) times daily. 11/08/16   Henreitta Leber, MD  PARoxetine (PAXIL) 30 MG tablet TAKE 1 TABLET BY MOUTH DAILY 02/07/17   Crecencio Mc, MD  PARoxetine (PAXIL) 30 MG tablet TAKE 1 TABLET BY MOUTH DAILY 05/17/17   Crecencio Mc, MD    Allergies Morphine sulfate  Family History  Problem Relation Age of Onset  . Coronary artery disease  Mother   . Alzheimer's disease Mother   . Coronary artery disease Father 39  . Heart attack Father   . Throat cancer Brother   . Lung cancer Maternal Aunt   . Kidney cancer Daughter   . Cervical cancer Daughter     Social History Social History   Tobacco Use  . Smoking status: Current Every Day Smoker    Packs/day: 0.50    Types: Cigarettes  . Smokeless tobacco: Never Used  . Tobacco comment: HAS BEEN SMOKING 30+ YEARS,RESUMED TOBACCO USE AFTER AAA REPAIR  Substance Use Topics  . Alcohol use: No  . Drug use: No    Review of Systems Constitutional: No fever/chills Eyes: No visual changes. ENT: No sore throat. Cardiovascular: Denies chest pain. Respiratory: Denies shortness of breath. Gastrointestinal: Positive for lower abdominal pain and diarrhea.  Genitourinary: Negative for dysuria. Musculoskeletal: Negative for back pain. Skin: Negative for rash. Neurological: Negative for headaches, focal weakness or numbness.  ____________________________________________   PHYSICAL EXAM:  VITAL SIGNS: ED Triage Vitals  Enc Vitals Group     BP 05/27/17 1720 93/67     Pulse Rate 05/27/17 1720 70     Resp 05/27/17 1720 18     Temp 05/27/17 1720 98.5 F (36.9 C)     Temp src --      SpO2 05/27/17 1720 100 %     Weight 05/27/17 1718 113 lb (51.3 kg)     Height --      Head Circumference --      Peak Flow --      Pain Score 05/27/17 1721 3   Constitutional: Alert and oriented. Well appearing and in no distress. Eyes: Conjunctivae are normal.  ENT   Head: Normocephalic and atraumatic.   Nose: No congestion/rhinnorhea.   Mouth/Throat: Mucous membranes are moist.   Neck: No stridor. Hematological/Lymphatic/Immunilogical: No cervical lymphadenopathy. Cardiovascular: Normal rate, regular rhythm.  No murmurs, rubs, or gallops. Respiratory: Normal respiratory effort without tachypnea nor retractions. Breath sounds are clear and equal bilaterally. No  wheezes/rales/rhonchi. Gastrointestinal: Soft and minimally tender in the lower abdomen. No rebound. No guarding.  Genitourinary: Deferred Musculoskeletal: Normal range of motion in all extremities. No lower extremity edema. Neurologic:  Normal speech and language. No gross focal neurologic deficits are appreciated.  Skin:  Skin is warm, dry and intact. No rash noted. Psychiatric: Mood and affect are normal. Speech and behavior are normal. Patient exhibits appropriate insight and judgment.  ____________________________________________    LABS (pertinent positives/negatives)  CBC  hgb 9.5 -> 10.1 Wbc 6.5 plt 154 CMP cr 1.44, glu 113 Lipase 35 ____________________________________________   EKG  None  ____________________________________________    RADIOLOGY  None  ____________________________________________   PROCEDURES  Procedures  ____________________________________________   INITIAL IMPRESSION / ASSESSMENT AND PLAN / ED COURSE  Pertinent labs & imaging results that were available during my care of the patient were reviewed by me and considered in my medical decision making (see chart for details).  Patient presented to the emergency department today because of diarrhea and concern for some black diarrhea.  Patient was observed here in the emergency department for roughly 4 hours without any further episodes of diarrhea.  Given concern for black diarrhea did check blood levels.  Initial was 9.5 which improved to 10.1 on repeat.  I do not think patient is having a significant blood loss.  At this point given lack of further diarrhea or concern for significant blood loss or GI related to think is reasonable for patient be discharged home.  Discussed results and plan with patient.  ____________________________________________   FINAL CLINICAL IMPRESSION(S) / ED DIAGNOSES  Final diagnoses:  Diarrhea, unspecified type     Note: This dictation was prepared with  Dragon dictation. Any transcriptional errors that result from this process are unintentional     Nance Pear, MD 05/27/17 2325

## 2017-05-27 NOTE — ED Triage Notes (Signed)
Pt c/o diarrhea x24 hours.

## 2017-06-02 ENCOUNTER — Other Ambulatory Visit: Payer: Self-pay | Admitting: Internal Medicine

## 2017-06-17 ENCOUNTER — Other Ambulatory Visit: Payer: Self-pay | Admitting: Internal Medicine

## 2017-06-17 NOTE — Telephone Encounter (Signed)
Refilled: 01/28/2017 Last OV: 04/08/2017 Next OV: not scheduled

## 2017-06-18 NOTE — Telephone Encounter (Signed)
Printed, signed and faxed.  

## 2017-06-21 DIAGNOSIS — J9611 Chronic respiratory failure with hypoxia: Secondary | ICD-10-CM | POA: Diagnosis not present

## 2017-06-21 DIAGNOSIS — J449 Chronic obstructive pulmonary disease, unspecified: Secondary | ICD-10-CM | POA: Diagnosis not present

## 2017-07-07 NOTE — Progress Notes (Signed)
Anna Prince  [phone] [phone]) [phone]  ID: Anna Prince OB: [birth year]  MR#: [id number]  ERD#:[id number]  Patient Care Team: Crecencio Mc, MD as PCP - General (Internal Medicine) Lin Landsman, MD as Consulting Physician (Gastroenterology) Stanford Breed Denice Bors, MD as Consulting Physician (Cardiology)  CHIEF COMPLAINT: Iron deficiency anemia.  INTERVAL HISTORY: Patient returns to clinic today for repeat laboratory work and further evaluation.  She continues to have mild weakness and fatigue, but admits this has improved.  She otherwise feels well. She has no neurologic complaints.  She denies any recent fevers or illnesses.  She has a good appetite and denies weight loss.  She has no chest pain or shortness of breath.  She denies any nausea, vomiting, constipation, or diarrhea.  She has no melena or hematochezia.  She has no urinary complaints.  Patient offers no specific complaints today.  REVIEW OF SYSTEMS:   Review of Systems  Constitutional: Positive for malaise/fatigue. Negative for fever and weight loss.  Respiratory: Negative.  Negative for cough and shortness of breath.   Cardiovascular: Negative.  Negative for chest pain and leg swelling.  Gastrointestinal: Negative.  Negative for abdominal pain, blood in stool and melena.  Genitourinary: Negative.  Negative for hematuria.  Musculoskeletal: Negative.   Skin: Negative.  Negative for rash.  Neurological: Positive for weakness. Negative for sensory change and focal weakness.  Psychiatric/Behavioral: Negative.  The patient is not nervous/anxious.     As per HPI. Otherwise, a complete review of systems is negative.  PAST MEDICAL HISTORY: Past Medical History:  Diagnosis Date  . AAA (abdominal aortic aneurysm) ([place])   . Anxiety   . Aortic aneurysm ([place])   . Arthritis   . Barrett's esophagus 2007  . Bursitis 2012   left hip, improved with periodic steroid injection [facility] Tom  Bush)  . COPD (chronic obstructive pulmonary disease) ([place])   . Coronary artery disease    a. LHC (08/27/16): 15% ostial LM, 99% pLAD and D1, Ramus & LCx nl, m/dRCA diffuse 80-90%, PDA occluded proximally, fills via L-R collats, EF 50-55%; b. PCI (08/31/16): Severe pLAD dz involving D1, successful orbital atherectomy PCI to pLAD w/ Synergy 3.0 x 20 mm DES, 0% residual stenosis & TIMI 3 flow, occluded ostD1, medically managed given lack of CP  . Depression   . Diastolic dysfunction    a. TTE (08/25/16): Mild LVH with LVEF of 50-55% and grade 1 diastolic dysfunction. Mild MR. Mild right atrial enlargement. Normal RV size and function  . GERD (gastroesophageal reflux disease)    with Barretts Esophagus  . GI bleeding    a. required pRBC May, July, and December 2018  . Hyperlipidemia   . Hypertension   . Iron deficiency anemia 2012  . Lumbago   . Macular degeneration   . Osteoporosis   . Sigmoid diverticulosis    by colonoscopy  . Stroke [facility])     PAST SURGICAL HISTORY: Past Surgical History:  Procedure Laterality Date  . ABDOMINAL AORTIC ANEURYSM REPAIR  July 2010   [facility]  . APPENDECTOMY    . BACK SURGERY     X2..1975 arachnoid cyst cervical region(blumquist,gso);1997 lumbosacral tumor,9 hr surgery  . CARDIAC CATHETERIZATION    . CARDIAC CATHETERIZATION    . cardiac stents    . COLONOSCOPY N/A 03/30/2017   Procedure: COLONOSCOPY;  Surgeon: Lin Landsman, MD;  Location: [facility] ENDOSCOPY;  Service: Gastroenterology;  Laterality: N/A;  . CORONARY ATHERECTOMY N/A 08/31/2016   Procedure:  Coronary Atherectomy;  Surgeon: Nelva Bush, MD;  Location: [place] CV LAB;  Service: Cardiovascular;  Laterality: N/A;  . ESOPHAGOGASTRODUODENOSCOPY N/A 03/29/2017   Procedure: ESOPHAGOGASTRODUODENOSCOPY (EGD);  Surgeon: Virgel Manifold, MD;  Location: [facility] ENDOSCOPY;  Service: Endoscopy;  Laterality: N/A;  . ESOPHAGOGASTRODUODENOSCOPY (EGD) WITH PROPOFOL N/A 09/02/2016   Procedure:  ESOPHAGOGASTRODUODENOSCOPY (EGD) WITH PROPOFOL;  Surgeon: Otis Brace, MD;  Location: [facility] ENDOSCOPY;  Service: Gastroenterology;  Laterality: N/A;  . ESOPHAGOGASTRODUODENOSCOPY (EGD) WITH PROPOFOL N/A 11/06/2016   Procedure: ESOPHAGOGASTRODUODENOSCOPY (EGD) WITH PROPOFOL;  Surgeon: Lucilla Lame, MD;  Location: [facility] ENDOSCOPY;  Service: Endoscopy;  Laterality: N/A;  . FLEXIBLE SIGMOIDOSCOPY N/A 03/29/2017   Procedure: FLEXIBLE SIGMOIDOSCOPY;  Surgeon: Virgel Manifold, MD;  Location: [facility] ENDOSCOPY;  Service: Endoscopy;  Laterality: N/A;  . INTRAVASCULAR ULTRASOUND/IVUS N/A 08/31/2016   Procedure: Intravascular Ultrasound/IVUS;  Surgeon: Nelva Bush, MD;  Location: [place] CV LAB;  Service: Cardiovascular;  Laterality: N/A;  . LEFT HEART CATH AND CORONARY ANGIOGRAPHY N/A 08/27/2016   Procedure: Left Heart Cath and Coronary Angiography;  Surgeon: Teodoro Spray, MD;  [facility] [place] [facility];  Service: Cardiovascular;  Laterality: N/A;    FAMILY HISTORY: Family History  Problem Relation Age of Onset  . Coronary artery disease Mother   . Alzheimer's disease Mother   . Coronary artery disease Father 12  . Heart attack Father   . Throat cancer Brother   . Lung cancer Maternal Aunt   . Kidney cancer Daughter   . Cervical cancer Daughter     ADVANCED DIRECTIVES (Y/N):  N  HEALTH MAINTENANCE: Social History   Tobacco Use  . Smoking status: Current Every Day Smoker    Packs/day: 0.50    Types: Cigarettes  . Smokeless tobacco: Never Used  . Tobacco comment: HAS BEEN SMOKING 30+ YEARS,RESUMED TOBACCO USE AFTER AAA REPAIR  Substance Use Topics  . Alcohol use: No  . Drug use: No     Colonoscopy:  PAP:  Bone density:  Lipid panel:  Allergies  Allergen Reactions  . Morphine Sulfate Other (See Comments)    Reaction:  Hallucinations     Current Outpatient Medications  Medication Sig Dispense Refill  . albuterol (PROVENTIL HFA;VENTOLIN HFA) 108 (90 Base) MCG/ACT  inhaler Inhale 2 puffs into the lungs every 6 (six) hours as needed for wheezing or shortness of breath. 1 Inhaler 2  . atorvastatin (LIPITOR) 40 MG tablet TAKE 1 TABLET(40 MG) BY MOUTH DAILY AT 6 PM 90 tablet 1  . budesonide-formoterol (SYMBICORT) 80-4.5 MCG/ACT inhaler Inhale 2 puffs into the lungs 2 (two) times daily. 3 Inhaler 1  . clopidogrel (PLAVIX) 75 MG tablet Take 1 tablet (75 mg total) by mouth daily. 90 tablet 1  . diazepam (VALIUM) 5 MG tablet TAKE 1 TABLET BY MOUTH TWICE DAILY AS NEEDED 60 tablet 0  . ferrous sulfate 325 (65 FE) MG tablet Take 1 tablet (325 mg total) by mouth daily with breakfast. 30 tablet 3  . HYDROcodone-acetaminophen (NORCO/VICODIN) 5-325 MG tablet Take 1 tablet by mouth every 6 (six) hours as needed for moderate pain. Maximum 2 daily. 62 tablet 0  . lisinopril (PRINIVIL,ZESTRIL) 20 MG tablet TAKE 1 TABLET BY MOUTH EVERY DAY 90 tablet 1  . megestrol (MEGACE) 40 MG tablet TAKE 1 TABLET(40 MG) BY MOUTH DAILY 90 tablet 0  . metoprolol tartrate (LOPRESSOR) 25 MG tablet Take 0.5 tablets (12.5 mg total) by mouth 2 (two) times daily. 60 tablet 0  . mirtazapine (REMERON) 45 MG tablet Take  1 tablet (45 mg total) by mouth at bedtime. 90 tablet 1  . nitroGLYCERIN (NITROSTAT) 0.4 MG SL tablet Place 1 tablet (0.4 mg total) under the tongue every 5 (five) minutes x 3 doses as needed for chest pain. 90 tablet 1  . pantoprazole (PROTONIX) 40 MG tablet Take 1 tablet (40 mg total) by mouth 2 (two) times daily. 60 tablet 2  . PARoxetine (PAXIL) 30 MG tablet TAKE 1 TABLET BY MOUTH DAILY 30 tablet 5  . PARoxetine (PAXIL) 30 MG tablet TAKE 1 TABLET BY MOUTH DAILY 90 tablet 1   No current facility-administered medications for this visit.     OBJECTIVE: Vitals:   07/11/17 1136 07/11/17 1141  BP:  (!) 160/92  Pulse:  67  Resp: 20   Temp:  (!) 97 F (36.1 C)     Body mass index is 18.92 kg/m.    ECOG FS:0 - Asymptomatic  General: Well-developed, well-nourished, no acute  distress. Eyes: Pink conjunctiva, anicteric sclera. Lungs: Clear to auscultation bilaterally. Heart: Regular rate and rhythm. No rubs, murmurs, or gallops. Abdomen: Soft, nontender, nondistended. No organomegaly noted, normoactive bowel sounds. Musculoskeletal: No edema, cyanosis, or clubbing. Neuro: Alert, answering all questions appropriately. Cranial nerves grossly intact. Skin: No rashes or petechiae noted. Psych: Normal affect.  LAB RESULTS:  Lab Results  Component Value Date   NA 137 07/11/2017   K 4.0 07/11/2017   CL 103 07/11/2017   CO2 24 07/11/2017   GLUCOSE 114 (H) 07/11/2017   BUN 25 (H) 07/11/2017   CREATININE 1.22 (H) 07/11/2017   CALCIUM 9.0 07/11/2017   PROT 6.9 07/11/2017   ALBUMIN 3.6 07/11/2017   AST 18 07/11/2017   ALT 12 (L) 07/11/2017   ALKPHOS 92 07/11/2017   BILITOT 0.5 07/11/2017   GFRNONAA 40 (L) 07/11/2017   GFRAA 46 (L) 07/11/2017    Lab Results  Component Value Date   WBC 6.6 07/11/2017   NEUTROABS 5.4 05/27/2017   HGB 12.2 07/11/2017   HCT 36.0 07/11/2017   MCV 93.4 07/11/2017   PLT 160 07/11/2017   Lab Results  Component Value Date   IRON 56 07/11/2017   TIBC 234 (L) 07/11/2017   IRONPCTSAT 24 07/11/2017   Lab Results  Component Value Date   FERRITIN 143 07/11/2017     STUDIES: No results found.  ASSESSMENT: Iron deficiency anemia.  PLAN:    1. Iron deficiency anemia: Patient's hemoglobin and iron stores are now within normal limits.  Previously, the remainder of her laboratory work was also within normal limits.  She does not require additional IV Feraheme today.  Patient's most recent infusion occurred on May 15, 2017. She has been instructed to continue her oral iron supplementation.  Return to clinic in 4 months with repeat laboratory work and further evaluation. 2.  Renal insufficiency: Patient's creatinine appears to be approximately her baseline.   3.  Hypertension: Patient's blood pressure is mildly elevated  today.  Continue monitoring and treatment per primary care.  Approximately 20 minutes was spent in discussion of which greater than 50% was consultation.  Patient expressed understanding and was in agreement with this plan. She also understands that She can call clinic at any time with any questions, concerns, or complaints.    Lloyd Huger, MD   07/14/2017 8:01 AM

## 2017-07-11 ENCOUNTER — Inpatient Hospital Stay: Payer: Medicare Other

## 2017-07-11 ENCOUNTER — Other Ambulatory Visit: Payer: Self-pay

## 2017-07-11 ENCOUNTER — Encounter: Payer: Self-pay | Admitting: Oncology

## 2017-07-11 ENCOUNTER — Inpatient Hospital Stay (HOSPITAL_BASED_OUTPATIENT_CLINIC_OR_DEPARTMENT_OTHER): Payer: Medicare Other | Admitting: Oncology

## 2017-07-11 ENCOUNTER — Inpatient Hospital Stay: Payer: Medicare Other | Attending: Oncology

## 2017-07-11 VITALS — BP 160/92 | HR 67 | Temp 97.0°F | Resp 20 | Ht 65.0 in | Wt 113.7 lb

## 2017-07-11 DIAGNOSIS — D509 Iron deficiency anemia, unspecified: Secondary | ICD-10-CM | POA: Diagnosis not present

## 2017-07-11 DIAGNOSIS — D500 Iron deficiency anemia secondary to blood loss (chronic): Secondary | ICD-10-CM

## 2017-07-11 DIAGNOSIS — N289 Disorder of kidney and ureter, unspecified: Secondary | ICD-10-CM | POA: Diagnosis not present

## 2017-07-11 DIAGNOSIS — I10 Essential (primary) hypertension: Secondary | ICD-10-CM | POA: Diagnosis not present

## 2017-07-11 LAB — IRON AND TIBC
IRON: 56 ug/dL (ref 28–170)
SATURATION RATIOS: 24 % (ref 10.4–31.8)
TIBC: 234 ug/dL — ABNORMAL LOW (ref 250–450)
UIBC: 178 ug/dL

## 2017-07-11 LAB — FERRITIN: FERRITIN: 143 ng/mL (ref 11–307)

## 2017-07-11 LAB — CBC
HCT: 36 % (ref 35.0–47.0)
Hemoglobin: 12.2 g/dL (ref 12.0–16.0)
MCH: 31.5 pg (ref 26.0–34.0)
MCHC: 33.8 g/dL (ref 32.0–36.0)
MCV: 93.4 fL (ref 80.0–100.0)
PLATELETS: 160 10*3/uL (ref 150–440)
RBC: 3.85 MIL/uL (ref 3.80–5.20)
RDW: 15.9 % — AB (ref 11.5–14.5)
WBC: 6.6 10*3/uL (ref 3.6–11.0)

## 2017-07-11 LAB — COMPREHENSIVE METABOLIC PANEL
ALT: 12 U/L — AB (ref 14–54)
AST: 18 U/L (ref 15–41)
Albumin: 3.6 g/dL (ref 3.5–5.0)
Alkaline Phosphatase: 92 U/L (ref 38–126)
Anion gap: 10 (ref 5–15)
BILIRUBIN TOTAL: 0.5 mg/dL (ref 0.3–1.2)
BUN: 25 mg/dL — ABNORMAL HIGH (ref 6–20)
CALCIUM: 9 mg/dL (ref 8.9–10.3)
CO2: 24 mmol/L (ref 22–32)
Chloride: 103 mmol/L (ref 101–111)
Creatinine, Ser: 1.22 mg/dL — ABNORMAL HIGH (ref 0.44–1.00)
GFR, EST AFRICAN AMERICAN: 46 mL/min — AB (ref >60)
GFR, EST NON AFRICAN AMERICAN: 40 mL/min — AB (ref >60)
Glucose, Bld: 114 mg/dL — ABNORMAL HIGH (ref 65–99)
Potassium: 4 mmol/L (ref 3.5–5.1)
Sodium: 137 mmol/L (ref 135–145)
TOTAL PROTEIN: 6.9 g/dL (ref 6.5–8.1)

## 2017-07-11 NOTE — Progress Notes (Signed)
Patient denies symptoms today. She has little energy.

## 2017-07-15 ENCOUNTER — Other Ambulatory Visit: Payer: Self-pay | Admitting: Internal Medicine

## 2017-07-17 ENCOUNTER — Other Ambulatory Visit: Payer: Self-pay

## 2017-07-17 MED ORDER — DIAZEPAM 5 MG PO TABS: 5.0000 mg | ORAL_TABLET | Freq: Two times a day (BID) | ORAL | 2 refills | Status: DC | PRN

## 2017-07-17 NOTE — Telephone Encounter (Signed)
Refilled: 06/17/2017 Last OV: 04/08/2017 Next OV: 07/23/2017

## 2017-07-18 NOTE — Telephone Encounter (Signed)
Printed, signed and faxed.  

## 2017-07-22 DIAGNOSIS — J9611 Chronic respiratory failure with hypoxia: Secondary | ICD-10-CM | POA: Diagnosis not present

## 2017-07-22 DIAGNOSIS — J449 Chronic obstructive pulmonary disease, unspecified: Secondary | ICD-10-CM | POA: Diagnosis not present

## 2017-07-23 ENCOUNTER — Ambulatory Visit: Payer: Medicare Other | Admitting: Internal Medicine

## 2017-08-09 ENCOUNTER — Emergency Department: Payer: Medicare Other

## 2017-08-09 ENCOUNTER — Other Ambulatory Visit: Payer: Self-pay

## 2017-08-09 ENCOUNTER — Emergency Department
Admission: EM | Admit: 2017-08-09 | Discharge: 2017-08-09 | Disposition: A | Discharge status: Home / Self Care | Payer: Medicare Other | Attending: Emergency Medicine | Admitting: Emergency Medicine

## 2017-08-09 ENCOUNTER — Encounter: Payer: Self-pay | Admitting: Emergency Medicine

## 2017-08-09 DIAGNOSIS — Z79899 Other long term (current) drug therapy: Secondary | ICD-10-CM | POA: Insufficient documentation

## 2017-08-09 DIAGNOSIS — K5792 Diverticulitis of intestine, part unspecified, without perforation or abscess without bleeding: Secondary | ICD-10-CM

## 2017-08-09 DIAGNOSIS — J449 Chronic obstructive pulmonary disease, unspecified: Secondary | ICD-10-CM | POA: Diagnosis not present

## 2017-08-09 DIAGNOSIS — I119 Hypertensive heart disease without heart failure: Secondary | ICD-10-CM | POA: Diagnosis not present

## 2017-08-09 DIAGNOSIS — F17210 Nicotine dependence, cigarettes, uncomplicated: Secondary | ICD-10-CM | POA: Diagnosis not present

## 2017-08-09 DIAGNOSIS — I2510 Atherosclerotic heart disease of native coronary artery without angina pectoris: Secondary | ICD-10-CM | POA: Insufficient documentation

## 2017-08-09 DIAGNOSIS — R1084 Generalized abdominal pain: Secondary | ICD-10-CM | POA: Diagnosis present

## 2017-08-09 DIAGNOSIS — K5732 Diverticulitis of large intestine without perforation or abscess without bleeding: Secondary | ICD-10-CM | POA: Diagnosis not present

## 2017-08-09 DIAGNOSIS — R109 Unspecified abdominal pain: Secondary | ICD-10-CM | POA: Insufficient documentation

## 2017-08-09 LAB — CBC WITH DIFFERENTIAL/PLATELET
BASOS PCT: 1 %
Basophils Absolute: 0 10*3/uL (ref 0–0.1)
EOS ABS: 0.2 10*3/uL (ref 0–0.7)
Eosinophils Relative: 4 %
HCT: 38 % (ref 35.0–47.0)
HEMOGLOBIN: 12.8 g/dL (ref 12.0–16.0)
Lymphocytes Relative: 19 %
Lymphs Abs: 0.8 10*3/uL — ABNORMAL LOW (ref 1.0–3.6)
MCH: 32 pg (ref 26.0–34.0)
MCHC: 33.6 g/dL (ref 32.0–36.0)
MCV: 95.2 fL (ref 80.0–100.0)
MONOS PCT: 6 %
Monocytes Absolute: 0.2 10*3/uL (ref 0.2–0.9)
Neutro Abs: 2.9 10*3/uL (ref 1.4–6.5)
Neutrophils Relative %: 70 %
PLATELETS: 165 10*3/uL (ref 150–440)
RBC: 3.99 MIL/uL (ref 3.80–5.20)
RDW: 13.6 % (ref 11.5–14.5)
WBC: 4.1 10*3/uL (ref 3.6–11.0)

## 2017-08-09 LAB — URINALYSIS, ROUTINE W REFLEX MICROSCOPIC
BILIRUBIN URINE: NEGATIVE
Glucose, UA: NEGATIVE mg/dL
KETONES UR: NEGATIVE mg/dL
NITRITE: NEGATIVE
PH: 7 (ref 5.0–8.0)
Protein, ur: NEGATIVE mg/dL
Specific Gravity, Urine: 1.008 (ref 1.005–1.030)

## 2017-08-09 LAB — BASIC METABOLIC PANEL
ANION GAP: 6 (ref 5–15)
BUN: 23 mg/dL — ABNORMAL HIGH (ref 6–20)
CALCIUM: 8.9 mg/dL (ref 8.9–10.3)
CHLORIDE: 106 mmol/L (ref 101–111)
CO2: 27 mmol/L (ref 22–32)
Creatinine, Ser: 1.18 mg/dL — ABNORMAL HIGH (ref 0.44–1.00)
GFR calc Af Amer: 48 mL/min — ABNORMAL LOW (ref >60)
GFR calc non Af Amer: 41 mL/min — ABNORMAL LOW (ref >60)
Glucose, Bld: 102 mg/dL — ABNORMAL HIGH (ref 65–99)
POTASSIUM: 4.1 mmol/L (ref 3.5–5.1)
SODIUM: 139 mmol/L (ref 135–145)

## 2017-08-09 LAB — HEPATIC FUNCTION PANEL
ALBUMIN: 3.7 g/dL (ref 3.5–5.0)
ALK PHOS: 106 U/L (ref 38–126)
ALT: 13 U/L — ABNORMAL LOW (ref 14–54)
AST: 18 U/L (ref 15–41)
Bilirubin, Direct: <0.1 mg/dL — ABNORMAL LOW (ref 0.1–0.5)
TOTAL PROTEIN: 6.8 g/dL (ref 6.5–8.1)
Total Bilirubin: 0.3 mg/dL (ref 0.3–1.2)

## 2017-08-09 LAB — LIPASE, BLOOD: Lipase: 34 U/L (ref 11–51)

## 2017-08-09 MED ORDER — AMOXICILLIN-POT CLAVULANATE 875-125 MG PO TABS: 1.0000 | ORAL_TABLET | Freq: Two times a day (BID) | ORAL | 0 refills | Status: AC

## 2017-08-09 MED ORDER — IBUPROFEN 400 MG PO TABS
400.0000 mg | ORAL_TABLET | Freq: Once | ORAL | Status: AC
  Administered 2017-08-09: 400 mg via ORAL
  Filled 2017-08-09: qty 1

## 2017-08-09 MED ORDER — AMOXICILLIN-POT CLAVULANATE 875-125 MG PO TABS
1.0000 | ORAL_TABLET | Freq: Once | ORAL | Status: AC
  Administered 2017-08-09: 1 via ORAL
  Filled 2017-08-09: qty 1

## 2017-08-09 MED ORDER — HYDROCODONE-ACETAMINOPHEN 5-325 MG PO TABS
1.0000 | ORAL_TABLET | Freq: Once | ORAL | Status: AC
  Administered 2017-08-09: 1 via ORAL
  Filled 2017-08-09: qty 1

## 2017-08-09 MED ORDER — HYDROCODONE-ACETAMINOPHEN 5-325 MG PO TABS: 1.0000 | ORAL_TABLET | Freq: Four times a day (QID) | ORAL | 0 refills | Status: DC | PRN

## 2017-08-09 NOTE — ED Triage Notes (Signed)
Pt to ED via POV c/o right flank pain that woke her up this morning. Pt states that now the pain comes and goes and feels like a needle. Pt denies N/V. Pt denies burning with urination. Pt in NAD at this time.

## 2017-08-09 NOTE — ED Provider Notes (Signed)
[facility] Emergency Department Provider Note  ____________________________________________   First MD Initiated Contact with Patient 08/09/17 1518     (approximate)  I have reviewed the triage vital signs and the nursing notes.   HISTORY  Chief Complaint Flank Pain   HPI Anna Prince is a 84 y.o. female who self presents to the emergency department with severe right flank pain that woke her up from her sleep this morning.  The pain is intermittent feels like a "needle" or like "bees stinging me".  Radiating around her right flank towards her right groin.  No dysuria frequency or hesitancy.  No nausea or vomiting.  No back pain.  Nothing in particular seems to make the pain better or worse.  No diarrhea.  No constipation.   Past Medical History:  Diagnosis Date  . AAA (abdominal aortic aneurysm) ([place])   . Anxiety   . Aortic aneurysm ([place])   . Arthritis   . Barrett's esophagus 2007  . Bursitis 2012   left hip, improved with periodic steroid injection [facility] [place] [facility])  . COPD (chronic obstructive pulmonary disease) ([place])   . Coronary artery disease    a. LHC (08/27/16): 15% ostial LM, 99% pLAD and D1, Ramus & LCx nl, m/dRCA diffuse 80-90%, PDA occluded proximally, fills via L-R collats, EF 50-55%; b. PCI (08/31/16): Severe pLAD dz involving D1, successful orbital atherectomy PCI to pLAD w/ Synergy 3.0 x 20 mm DES, 0% residual stenosis & TIMI 3 flow, occluded ostD1, medically managed given lack of CP  . Depression   . Diastolic dysfunction    a. TTE (08/25/16): Mild LVH with LVEF of 50-55% and grade 1 diastolic dysfunction. Mild MR. Mild right atrial enlargement. Normal RV size and function  . GERD (gastroesophageal reflux disease)    with Barretts Esophagus  . GI bleeding    a. required pRBC May, July, and December 2018  . Hyperlipidemia   . Hypertension   . Iron deficiency anemia 2012  . Lumbago   . Macular degeneration   . Osteoporosis   .  Sigmoid diverticulosis    by colonoscopy  . Stroke [facility])     Patient Active Problem List   Diagnosis Date Noted  . Acute peptic ulcer of stomach   . Hiatal hernia   . Diverticulosis of colon without diverticulitis   . GI bleed 03/26/2017  . GERD (gastroesophageal reflux disease) 11/05/2016  . CAD (coronary artery disease) 08/28/2016  . Non-ST elevation (NSTEMI) myocardial infarction ([place]) 08/24/2016  . Chronic respiratory failure with hypoxia ([place]) 05/17/2016  . Generalized muscle weakness 04/16/2015  . Hospital discharge follow-up 04/16/2015  . Coronary artery disease due to lipid rich plaque 04/14/2015  . Anxiety 04/14/2015  . Bursitis of left hip 11/27/2014  . Iron deficiency anemia 11/25/2014  . Elevated liver enzymes 08/25/2014  . History of fracture of pelvis or lower extremity 08/25/2014  . Vascular dementia with depressed mood 02/20/2014  . Chronic pain syndrome 05/27/2013  . Tobacco abuse counseling 04/10/2013  . Gastric ulcer requiring drug therapy   . Screening for colon cancer 06/18/2011  . COPD (chronic obstructive pulmonary disease) ([place]) 06/18/2011  . Skin cancer of nose 12/17/2010  . Vitamin D deficiency 12/17/2010  . Hyperlipidemia LDL goal <100 12/17/2010  . Screening for malignant neoplasm of breast 12/17/2010  . Encounter for long-term (current) use of other medications 12/17/2010  . S/P abdominal aortic aneurysm repair 12/17/2010  . Diverticulosis, sigmoid 12/08/2010  . Hyperlipidemia 12/08/2010  .  Hypertension 12/08/2010  . Lumbago 12/08/2010  . Osteoporosis 12/08/2010  . Major depressive disorder, recurrent episode, moderate ([place]) 12/08/2010    Past Surgical History:  Procedure Laterality Date  . ABDOMINAL AORTIC ANEURYSM REPAIR  July 2010   [facility]  . APPENDECTOMY    . BACK SURGERY     X2..1975 arachnoid cyst cervical region([place]);1997 lumbosacral tumor,9 hr surgery  . CARDIAC CATHETERIZATION    . CARDIAC CATHETERIZATION    . cardiac  stents    . COLONOSCOPY N/A 03/30/2017   Procedure: COLONOSCOPY;  Surgeon: Lin Landsman, MD;  Location: [facility] ENDOSCOPY;  Service: Gastroenterology;  Laterality: N/A;  . CORONARY ATHERECTOMY N/A 08/31/2016   Procedure: Coronary Atherectomy;  Surgeon: Nelva [place], MD;  Location: [facility] CV LAB;  Service: Cardiovascular;  Laterality: N/A;  . ESOPHAGOGASTRODUODENOSCOPY N/A 03/29/2017   Procedure: ESOPHAGOGASTRODUODENOSCOPY (EGD);  Surgeon: Virgel Manifold, MD;  Location: [facility] ENDOSCOPY;  Service: Endoscopy;  Laterality: N/A;  . ESOPHAGOGASTRODUODENOSCOPY (EGD) WITH PROPOFOL N/A 09/02/2016   Procedure: ESOPHAGOGASTRODUODENOSCOPY (EGD) WITH PROPOFOL;  Surgeon: Otis Brace, MD;  Location: [facility] ENDOSCOPY;  Service: Gastroenterology;  Laterality: N/A;  . ESOPHAGOGASTRODUODENOSCOPY (EGD) WITH PROPOFOL N/A 11/06/2016   Procedure: ESOPHAGOGASTRODUODENOSCOPY (EGD) WITH PROPOFOL;  Surgeon: Lucilla Lame, MD;  Location: [facility] ENDOSCOPY;  Service: Endoscopy;  Laterality: N/A;  . FLEXIBLE SIGMOIDOSCOPY N/A 03/29/2017   Procedure: FLEXIBLE SIGMOIDOSCOPY;  Surgeon: Virgel Manifold, MD;  Location: [facility] ENDOSCOPY;  Service: Endoscopy;  Laterality: N/A;  . INTRAVASCULAR ULTRASOUND/IVUS N/A 08/31/2016   Procedure: Intravascular Ultrasound/IVUS;  Surgeon: Nelva [place], MD;  Location: [facility] CV LAB;  Service: Cardiovascular;  Laterality: N/A;  . LEFT HEART CATH AND CORONARY ANGIOGRAPHY N/A 08/27/2016   Procedure: Left Heart Cath and Coronary Angiography;  Surgeon: Teodoro Spray, MD;  Location: [place] CV LAB;  Service: Cardiovascular;  Laterality: N/A;    Prior to Admission medications   Medication Sig Start Date End Date Taking? Authorizing Provider  albuterol (PROVENTIL HFA;VENTOLIN HFA) 108 (90 Base) MCG/ACT inhaler Inhale 2 puffs into the lungs every 6 (six) hours as needed for wheezing or shortness of breath. 11/11/15   Alfred Levins, [place], MD  amoxicillin-clavulanate (AUGMENTIN)  875-125 MG tablet Take 1 tablet by mouth 2 (two) times daily for 10 days. 08/09/17 08/19/17  Darel Hong, MD  atorvastatin (LIPITOR) 40 MG tablet TAKE 1 TABLET(40 MG) BY MOUTH DAILY AT 6 PM 06/03/17   Crecencio Mc, MD  budesonide-formoterol (SYMBICORT) 80-4.5 MCG/ACT inhaler Inhale 2 puffs into the lungs 2 (two) times daily. 10/08/16   Crecencio Mc, MD  clopidogrel (PLAVIX) 75 MG tablet Take 1 tablet (75 mg total) by mouth daily. 10/08/16   Crecencio Mc, MD  diazepam (VALIUM) 5 MG tablet Take 1 tablet (5 mg total) by mouth 2 (two) times daily as needed. 07/17/17   Crecencio Mc, MD  ferrous sulfate 325 (65 FE) MG tablet Take 1 tablet (325 mg total) by mouth daily with breakfast. 09/03/16   Lendon Colonel, NP  HYDROcodone-acetaminophen (NORCO) 5-325 MG tablet Take 1 tablet by mouth every 6 (six) hours as needed for up to 7 doses for severe pain. 08/09/17   Darel Hong, MD  lisinopril (PRINIVIL,ZESTRIL) 20 MG tablet TAKE 1 TABLET BY MOUTH EVERY DAY 01/11/17   Crecencio Mc, MD  lisinopril (PRINIVIL,ZESTRIL) 20 MG tablet TAKE 1 TABLET BY MOUTH EVERY DAY 07/15/17   Crecencio Mc, MD  megestrol (MEGACE) 40 MG tablet TAKE 1 TABLET(40 MG) BY MOUTH DAILY 02/06/17   Derrel Nip,  Aris Everts, MD  metoprolol tartrate (LOPRESSOR) 25 MG tablet Take 0.5 tablets (12.5 mg total) by mouth 2 (two) times daily. 08/27/16   Bettey Costa, MD  mirtazapine (REMERON) 45 MG tablet Take 1 tablet (45 mg total) by mouth at bedtime. 10/08/16   Crecencio Mc, MD  nitroGLYCERIN (NITROSTAT) 0.4 MG SL tablet Place 1 tablet (0.4 mg total) under the tongue every 5 (five) minutes x 3 doses as needed for chest pain. 09/28/16   Crecencio Mc, MD  pantoprazole (PROTONIX) 40 MG tablet Take 1 tablet (40 mg total) by mouth 2 (two) times daily. 11/08/16   Henreitta Leber, MD  PARoxetine (PAXIL) 30 MG tablet TAKE 1 TABLET BY MOUTH DAILY 02/07/17   Crecencio Mc, MD  PARoxetine (PAXIL) 30 MG tablet TAKE 1 TABLET BY MOUTH DAILY 05/17/17    Crecencio Mc, MD  SYMBICORT 80-4.5 MCG/ACT inhaler INHALE 2 PUFFS INTO THE LUNGS TWICE DAILY 07/15/17   Crecencio Mc, MD    Allergies Morphine sulfate  Family History  Problem Relation Age of Onset  . Coronary artery disease Mother   . Alzheimer's disease Mother   . Coronary artery disease Father 69  . Heart attack Father   . Throat cancer Brother   . Lung cancer Maternal Aunt   . Kidney cancer Daughter   . Cervical cancer Daughter     Social History Social History   Tobacco Use  . Smoking status: Current Every Day Smoker    Packs/day: 0.50    Types: Cigarettes  . Smokeless tobacco: Never Used  . Tobacco comment: HAS BEEN SMOKING 30+ YEARS,RESUMED TOBACCO USE AFTER AAA REPAIR  Substance Use Topics  . Alcohol use: No  . Drug use: No    Review of Systems Constitutional: No fever/chills Eyes: No visual changes. ENT: No sore throat. Cardiovascular: Denies chest pain. Respiratory: Denies shortness of breath. Gastrointestinal: Positive for abdominal pain.  No nausea, no vomiting.  No diarrhea.  No constipation. Genitourinary: Negative for dysuria. Musculoskeletal: Negative for back pain. Skin: Negative for rash. Neurological: Negative for headaches, focal weakness or numbness.   ____________________________________________   PHYSICAL EXAM:  VITAL SIGNS: ED Triage Vitals  Enc Vitals Group     BP 08/09/17 1442 129/73     Pulse Rate 08/09/17 1442 78     Resp 08/09/17 1442 16     Temp 08/09/17 1442 99.1 F (37.3 C)     Temp Source 08/09/17 1442 Oral     SpO2 08/09/17 1442 95 %     Weight 08/09/17 1443 113 lb (51.3 kg)     Height 08/09/17 1443 5\' 5"  (1.651 m)     Head Circumference --      Peak Flow --      Pain Score 08/09/17 1443 0     Pain Loc --      Pain Edu? --      Excl. in [place]? --     Constitutional: Alert and oriented x4 pleasant cooperative speaks in full clear sentences no diaphoresis Eyes: PERRL EOMI. Head: Atraumatic. Nose: No  congestion/rhinnorhea. Mouth/Throat: No trismus Neck: No stridor.   Cardiovascular: Normal rate, regular rhythm. Grossly normal heart sounds.  Good peripheral circulation. Respiratory: Normal respiratory effort.  No retractions. Lungs CTAB and moving good air Gastrointestinal: Soft nondistended mild diffuse tenderness with no rebound or guarding no peritonitis no costovertebral tenderness Musculoskeletal: No lower extremity edema   Neurologic:  Normal speech and language. No gross focal neurologic deficits are  appreciated. Skin:  Skin is warm, dry and intact. No rash noted. Psychiatric: Mood and affect are normal. Speech and behavior are normal.    ____________________________________________   DIFFERENTIAL includes but not limited to  Renal colic, urinary tract infection, pyelonephritis, diverticulitis ____________________________________________   LABS (all labs ordered are listed, but only abnormal results are displayed)  Labs Reviewed  URINE CULTURE - Abnormal; Notable for the following components:      Result Value   Culture MULTIPLE SPECIES PRESENT, SUGGEST RECOLLECTION (*)    All other components within normal limits  BASIC METABOLIC PANEL - Abnormal; Notable for the following components:   Glucose, Bld 102 (*)    BUN 23 (*)    Creatinine, Ser 1.18 (*)    GFR calc non Af Amer 41 (*)    GFR calc Af Amer 48 (*)    All other components within normal limits  URINALYSIS, ROUTINE W REFLEX MICROSCOPIC - Abnormal; Notable for the following components:   Color, Urine YELLOW (*)    APPearance CLOUDY (*)    Hgb urine dipstick SMALL (*)    Leukocytes, UA LARGE (*)    Bacteria, UA RARE (*)    Squamous Epithelial / LPF 6-30 (*)    All other components within normal limits  CBC WITH DIFFERENTIAL/PLATELET - Abnormal; Notable for the following components:   Lymphs Abs 0.8 (*)    All other components within normal limits  HEPATIC FUNCTION PANEL - Abnormal; Notable for the following  components:   ALT 13 (*)    Bilirubin, Direct <0.1 (*)    All other components within normal limits  LIPASE, BLOOD    Lab work reviewed by me with bacteria and leukocytes in the urine although a large amount of squames so nonspecific __________________________________________  EKG   ____________________________________________  RADIOLOGY  CT abdomen pelvis reviewed by me concerning for diverticulitis ____________________________________________   PROCEDURES  Procedure(s) performed: no  Procedures  Critical Care performed: no  Observation: no ____________________________________________   INITIAL IMPRESSION / ASSESSMENT AND PLAN / ED COURSE  Pertinent labs & imaging results that were available during my care of the patient were reviewed by me and considered in my medical decision making (see chart for details).  Patient arrives with flank pain radiating to her groin raising concern for nephrolithiasis versus pyelonephritis.  CT scan performed which is consistent most with actually acute diverticulitis.  She has no dysuria but there is some bacteria and white's in her urine so we will send off a culture now.  Doubt pyelonephritis.  I will cover her with Augmentin and refer her back to primary care.  Strict return precautions have been given to the patient verbalizes understanding and agreement with the plan.      ____________________________________________   FINAL CLINICAL IMPRESSION(S) / ED DIAGNOSES  Final diagnoses:  Diverticulitis  Abdominal pain, unspecified abdominal location      NEW MEDICATIONS STARTED DURING THIS VISIT:  Discharge Medication List as of 08/09/2017  4:52 PM    START taking these medications   Details  amoxicillin-clavulanate (AUGMENTIN) 875-125 MG tablet Take 1 tablet by mouth 2 (two) times daily for 10 days., Starting Fri 08/09/2017, Until Mon 08/19/2017, Print         Note:  This document was prepared using Dragon voice  recognition software and may include unintentional dictation errors.     Darel Hong, MD 08/11/17 [phone]

## 2017-08-09 NOTE — ED Notes (Signed)
Pt alert and oriented X4, active, cooperative, pt in NAD. RR even and unlabored, color WNL.  Pt informed to return if any life threatening symptoms occur.  Discharge and followup instructions reviewed.  

## 2017-08-09 NOTE — Discharge Instructions (Signed)
Please take all of your antibiotics as prescribed and use your pain medication as needed for severe symptoms.  Follow-up with your primary care physician this coming Monday for recheck as needed return to the emergency department sooner for any concerns whatsoever.  It was a pleasure to take care of you today, and thank you for coming to our emergency department.  If you have any questions or concerns before leaving please ask the nurse to grab me and I'm more than happy to go through your aftercare instructions again.  If you were prescribed any opioid pain medication today such as Norco, Vicodin, Percocet, morphine, hydrocodone, or oxycodone please make sure you do not drive when you are taking this medication as it can alter your ability to drive safely.  If you have any concerns once you are home that you are not improving or are in fact getting worse before you can make it to your follow-up appointment, please do not hesitate to call [phone] and come back for further evaluation.  Darel Hong, MD  Results for orders placed or performed during the hospital encounter of 19/62/22  Basic metabolic panel  Result Value Ref Range   Sodium 139 135 - 145 mmol/L   Potassium 4.1 3.5 - 5.1 mmol/L   Chloride 106 101 - 111 mmol/L   CO2 27 22 - 32 mmol/L   Glucose, Bld 102 (H) 65 - 99 mg/dL   BUN 23 (H) 6 - 20 mg/dL   Creatinine, Ser 1.18 (H) 0.44 - 1.00 mg/dL   Calcium 8.9 8.9 - 10.3 mg/dL   GFR calc non Af Amer 41 (L) >60 mL/min   GFR calc Af Amer 48 (L) >60 mL/min   Anion gap 6 5 - 15  Urinalysis, Routine w reflex microscopic  Result Value Ref Range   Color, Urine YELLOW (A) YELLOW   APPearance CLOUDY (A) CLEAR   Specific Gravity, Urine 1.008 1.005 - 1.030   pH 7.0 5.0 - 8.0   Glucose, UA NEGATIVE NEGATIVE mg/dL   Hgb urine dipstick SMALL (A) NEGATIVE   Bilirubin Urine NEGATIVE NEGATIVE   Ketones, ur NEGATIVE NEGATIVE mg/dL   Protein, ur NEGATIVE NEGATIVE mg/dL   Nitrite NEGATIVE NEGATIVE     Leukocytes, UA LARGE (A) NEGATIVE   RBC / HPF 6-30 0 - 5 RBC/hpf   WBC, UA TOO NUMEROUS TO COUNT 0 - 5 WBC/hpf   Bacteria, UA RARE (A) NONE SEEN   Squamous Epithelial / LPF 6-30 (A) NONE SEEN  CBC with Differential  Result Value Ref Range   WBC 4.1 3.6 - 11.0 K/uL   RBC 3.99 3.80 - 5.20 MIL/uL   Hemoglobin 12.8 12.0 - 16.0 g/dL   HCT 38.0 35.0 - 47.0 %   MCV 95.2 80.0 - 100.0 fL   MCH 32.0 26.0 - 34.0 pg   MCHC 33.6 32.0 - 36.0 g/dL   RDW 13.6 11.5 - 14.5 %   Platelets 165 150 - 440 K/uL   Neutrophils Relative % 70 %   Neutro Abs 2.9 1.4 - 6.5 K/uL   Lymphocytes Relative 19 %   Lymphs Abs 0.8 (L) 1.0 - 3.6 K/uL   Monocytes Relative 6 %   Monocytes Absolute 0.2 0.2 - 0.9 K/uL   Eosinophils Relative 4 %   Eosinophils Absolute 0.2 0 - 0.7 K/uL   Basophils Relative 1 %   Basophils Absolute 0.0 0 - 0.1 K/uL  Hepatic function panel  Result Value Ref Range   Total Protein 6.8  6.5 - 8.1 g/dL   Albumin 3.7 3.5 - 5.0 g/dL   AST 18 15 - 41 U/L   ALT 13 (L) 14 - 54 U/L   Alkaline Phosphatase 106 38 - 126 U/L   Total Bilirubin 0.3 0.3 - 1.2 mg/dL   Bilirubin, Direct <0.1 (L) 0.1 - 0.5 mg/dL   Indirect Bilirubin NOT CALCULATED 0.3 - 0.9 mg/dL  Lipase, blood  Result Value Ref Range   Lipase 34 11 - 51 U/L   Ct Renal Stone Study  Result Date: 08/09/2017 CLINICAL DATA:  Right flank pain. EXAM: CT ABDOMEN AND PELVIS WITHOUT CONTRAST TECHNIQUE: Multidetector CT imaging of the abdomen and pelvis was performed following the standard protocol without IV contrast. COMPARISON:  11/05/2016 FINDINGS: Lower chest: Mild bibasilar scarring. Similar moderate-sized sliding hiatal hernia. Hepatobiliary: Unchanged 4.8 cm left hepatic lobe cyst. Punctate stone in the region of the gallbladder neck without gross gallbladder wall thickening or pericholecystic inflammatory change. No biliary dilatation. Pancreas: Unremarkable. Spleen: Unremarkable. Adrenals/Urinary Tract: Unremarkable adrenal glands.  Unchanged punctate calcification in the right renal hilum which is likely vascular. No hydronephrosis or definite urinary tract calculi. Unremarkable bladder. Stomach/Bowel: Moderate amount of stool in the colon, particularly in the cecum and ascending colon. Mild fat stranding around the ascending colon is similar to the prior study without underlying colonic wall thickening. Extensive sigmoid colon diverticulosis is noted with mild fat stranding adjacent to the proximal sigmoid colon (for example series 2, image 43). There is fecalization of some small bowel loops suggestive of slow transit. No bowel dilatation is seen to suggest obstruction. Vascular/Lymphatic: Extensive atherosclerosis of the abdominal aorta and its major branch vessels. Ectatic suprarenal aorta is unchanged with 3.0 cm maximal diameter. No enlarged lymph nodes. Reproductive: Uterus and bilateral adnexa are unremarkable. Other: No intraperitoneal free fluid. Musculoskeletal: Severe lumbar levoscoliosis. Bilateral L5 pars defects with unchanged grade 2 anterolisthesis. Chronic L2 scratch sec chronic L1 compression fracture. IMPRESSION: 1. Mild fat stranding around the ascending colon, similar to the prior study and without significant colonic wall thickening though recurrent mild colitis is a consideration. 2. Extensive sigmoid colon diverticulosis with mild surrounding fat stranding proximally, query mild diverticulitis. 3. No hydronephrosis or definite urinary tract calculi. 4. Unchanged suprarenal abdominal aortic ectasia. 5.  Aortic Atherosclerosis (ICD10-I70.0). Electronically Signed   By: Logan Bores M.D.   On: 08/09/2017 16:02

## 2017-08-10 LAB — URINE CULTURE

## 2017-08-15 ENCOUNTER — Telehealth: Payer: Self-pay

## 2017-08-15 ENCOUNTER — Other Ambulatory Visit: Payer: Self-pay | Admitting: Internal Medicine

## 2017-08-15 NOTE — Telephone Encounter (Signed)
Called patients daughter [name] [facility])  And states that she noticed a rash on her mothers right side on yesterday. Her mother describes the pain with the rash as  bee stings with some itching. Her daughters states there are blisters starting from the back and continuing around to the side to under the breast. She has placed some oatmeal cream on it with no relief.

## 2017-08-15 NOTE — Telephone Encounter (Signed)
Spoke with pt's daughter and informed her that the pt will need to be seen in order to get anything prescribed for shingles. Pt is scheduled for tomorrow at 11:45am with Dr. Aundra Dubin. Daughter gave a verbal understanding.

## 2017-08-15 NOTE — Telephone Encounter (Signed)
Copied from [place] [id number]. Topic: General - Other >> Aug 15, 2017 10:45 AM Carolyn Stare wrote:  Pt daughter [name] called ans said she think her Mom has shingles and is asking if there is something that it ir treated  with    Kim  [phone]   Pharmacy [place]

## 2017-08-16 ENCOUNTER — Ambulatory Visit (INDEPENDENT_AMBULATORY_CARE_PROVIDER_SITE_OTHER): Payer: Medicare Other | Admitting: Internal Medicine

## 2017-08-16 ENCOUNTER — Encounter: Payer: Self-pay | Admitting: Internal Medicine

## 2017-08-16 VITALS — BP 88/56 | HR 83 | Temp 98.2°F | Ht 65.0 in | Wt 123.8 lb

## 2017-08-16 DIAGNOSIS — R110 Nausea: Secondary | ICD-10-CM | POA: Diagnosis not present

## 2017-08-16 DIAGNOSIS — B029 Zoster without complications: Secondary | ICD-10-CM | POA: Diagnosis not present

## 2017-08-16 MED ORDER — ONDANSETRON HCL 4 MG PO TABS: 4.0000 mg | ORAL_TABLET | Freq: Three times a day (TID) | ORAL | 0 refills | Status: DC | PRN

## 2017-08-16 MED ORDER — HYDROCODONE-ACETAMINOPHEN 5-325 MG PO TABS
1.0000 | ORAL_TABLET | Freq: Three times a day (TID) | ORAL | 0 refills | Status: DC | PRN
Start: 2017-08-16 — End: 2017-08-30

## 2017-08-16 MED ORDER — VALACYCLOVIR HCL 1 G PO TABS: 1000.0000 mg | ORAL_TABLET | Freq: Two times a day (BID) | ORAL | 0 refills | Status: DC

## 2017-08-16 NOTE — Patient Instructions (Signed)
Take Valtrex 2x per day with food x 2 weeks  Norco 3x per day as needed   Shingles Shingles, which is also known as herpes zoster, is an infection that causes a painful skin rash and fluid-filled blisters. Shingles is not related to genital herpes, which is a sexually transmitted infection. Shingles only develops in people who:  Have had chickenpox.  Have received the chickenpox vaccine. (This is rare.)  What are the causes? Shingles is caused by varicella-zoster virus (VZV). This is the same virus that causes chickenpox. After exposure to VZV, the virus stays in the body in an inactive (dormant) state. Shingles develops if the virus reactivates. This can happen many years after the initial exposure to VZV. It is not known what causes this virus to reactivate. What increases the risk? People who have had chickenpox or received the chickenpox vaccine are at risk for shingles. Infection is more common in people who:  Are older than age 7.  Have a weakened defense (immune) system, such as those with HIV, AIDS, or cancer.  Are taking medicines that weaken the immune system, such as transplant medicines.  Are under great stress.  What are the signs or symptoms? Early symptoms of this condition include itching, tingling, and pain in an area on your skin. Pain may be described as burning, stabbing, or throbbing. A few days or weeks after symptoms start, a painful red rash appears, usually on one side of the body in a bandlike or beltlike pattern. The rash eventually turns into fluid-filled blisters that break open, scab over, and dry up in about 2-3 weeks. At any time during the infection, you may also develop:  A fever.  Chills.  A headache.  An upset stomach.  How is this diagnosed? This condition is diagnosed with a skin exam. Sometimes, skin or fluid samples are taken from the blisters before a diagnosis is made. These samples are examined under a microscope or sent to a lab for  testing. How is this treated? There is no specific cure for this condition. Your health care provider will probably prescribe medicines to help you manage pain, recover more quickly, and avoid long-term problems. Medicines may include:  Antiviral drugs.  Anti-inflammatory drugs.  Pain medicines.  If the area involved is on your face, you may be referred to a specialist, such as an eye doctor (ophthalmologist) or an ear, nose, and throat (ENT) doctor to help you avoid eye problems, chronic pain, or disability. Follow these instructions at home: Medicines  Take medicines only as directed by your health care provider.  Apply an anti-itch or numbing cream to the affected area as directed by your health care provider. Blister and Rash Care  Take a cool bath or apply cool compresses to the area of the rash or blisters as directed by your health care provider. This may help with pain and itching.  Keep your rash covered with a loose bandage (dressing). Wear loose-fitting clothing to help ease the pain of material rubbing against the rash.  Keep your rash and blisters clean with mild soap and cool water or as directed by your health care provider.  Check your rash every day for signs of infection. These include redness, swelling, and pain that lasts or increases.  Do not pick your blisters.  Do not scratch your rash. General instructions  Rest as directed by your health care provider.  Keep all follow-up visits as directed by your health care provider. This is important.  Until  your blisters scab over, your infection can cause chickenpox in people who have never had it or been vaccinated against it. To prevent this from happening, avoid contact with other people, especially: ? Babies. ? Pregnant women. ? Children who have eczema. ? Elderly people who have transplants. ? People who have chronic illnesses, such as leukemia or AIDS. Contact a health care provider if:  Your pain is  not relieved with prescribed medicines.  Your pain does not get better after the rash heals.  Your rash looks infected. Signs of infection include redness, swelling, and pain that lasts or increases. Get help right away if:  The rash is on your face or nose.  You have facial pain, pain around your eye area, or loss of feeling on one side of your face.  You have ear pain or you have ringing in your ear.  You have loss of taste.  Your condition gets worse. This information is not intended to replace advice given to you by your health care provider. Make sure you discuss any questions you have with your health care provider. Document Released: 04/09/2005 Document Revised: 12/04/2015 Document Reviewed: 02/18/2014 Elsevier Interactive Patient Education  2018 [employer].

## 2017-08-16 NOTE — Progress Notes (Signed)
Pre visit review using our clinic review tool, if applicable. No additional management support is needed unless otherwise documented below in the visit note. 

## 2017-08-16 NOTE — Progress Notes (Signed)
Chief Complaint  Patient presents with  . Herpes Zoster   F/u shingles right flank/back had pain last Friday and went to ER rash started 2 days ago. Rash is painful no numbness/tingling also c/o low energy    Review of Systems  Constitutional: Positive for malaise/fatigue. Negative for fever.  Gastrointestinal: Positive for nausea.  Skin: Positive for rash.  Psychiatric/Behavioral: Positive for memory loss.   Past Medical History:  Diagnosis Date  . AAA (abdominal aortic aneurysm) ([place])   . Anxiety   . Aortic aneurysm ([place])   . Arthritis   . Barrett's esophagus 2007  . Bursitis 2012   left hip, improved with periodic steroid injection [facility])  . COPD (chronic obstructive pulmonary disease) ([place])   . Coronary artery disease    a. LHC (08/27/16): 15% ostial LM, 99% pLAD and D1, Ramus & LCx nl, m/dRCA diffuse 80-90%, PDA occluded proximally, fills via L-R collats, EF 50-55%; b. PCI (08/31/16): Severe pLAD dz involving D1, successful orbital atherectomy PCI to pLAD w/ Synergy 3.0 x 20 mm DES, 0% residual stenosis & TIMI 3 flow, occluded ostD1, medically managed given lack of CP  . Depression   . Diastolic dysfunction    a. TTE (08/25/16): Mild LVH with LVEF of 50-55% and grade 1 diastolic dysfunction. Mild MR. Mild right atrial enlargement. Normal RV size and function  . GERD (gastroesophageal reflux disease)    with Barretts Esophagus  . GI bleeding    a. required pRBC May, July, and December 2018  . Hyperlipidemia   . Hypertension   . Iron deficiency anemia 2012  . Lumbago   . Macular degeneration   . Osteoporosis   . Shingles    right back/abdomen 08/16/17   . Sigmoid diverticulosis    by colonoscopy  . Stroke [facility])    Past Surgical History:  Procedure Laterality Date  . ABDOMINAL AORTIC ANEURYSM REPAIR  July 2010   [facility]  . APPENDECTOMY    . BACK SURGERY     X2..1975 arachnoid cyst cervical region([place]);1997 lumbosacral tumor,9 hr surgery  . CARDIAC  CATHETERIZATION    . CARDIAC CATHETERIZATION    . cardiac stents    . COLONOSCOPY N/A 03/30/2017   Procedure: COLONOSCOPY;  Surgeon: Lin Landsman, MD;  Location: [facility] ENDOSCOPY;  Service: Gastroenterology;  Laterality: N/A;  . CORONARY ATHERECTOMY N/A 08/31/2016   Procedure: Coronary Atherectomy;  Surgeon: Nelva Bush, MD;  Location: [place] CV LAB;  Service: Cardiovascular;  Laterality: N/A;  . ESOPHAGOGASTRODUODENOSCOPY N/A 03/29/2017   Procedure: ESOPHAGOGASTRODUODENOSCOPY (EGD);  Surgeon: Virgel Manifold, MD;  Location: [facility] ENDOSCOPY;  Service: Endoscopy;  Laterality: N/A;  . ESOPHAGOGASTRODUODENOSCOPY (EGD) WITH PROPOFOL N/A 09/02/2016   Procedure: ESOPHAGOGASTRODUODENOSCOPY (EGD) WITH PROPOFOL;  Surgeon: Otis Brace, MD;  Location: [facility] ENDOSCOPY;  Service: Gastroenterology;  Laterality: N/A;  . ESOPHAGOGASTRODUODENOSCOPY (EGD) WITH PROPOFOL N/A 11/06/2016   Procedure: ESOPHAGOGASTRODUODENOSCOPY (EGD) WITH PROPOFOL;  Surgeon: Lucilla Lame, MD;  Location: [facility] ENDOSCOPY;  Service: Endoscopy;  Laterality: N/A;  . FLEXIBLE SIGMOIDOSCOPY N/A 03/29/2017   Procedure: FLEXIBLE SIGMOIDOSCOPY;  Surgeon: Virgel Manifold, MD;  Location: [facility] ENDOSCOPY;  Service: Endoscopy;  Laterality: N/A;  . INTRAVASCULAR ULTRASOUND/IVUS N/A 08/31/2016   Procedure: Intravascular Ultrasound/IVUS;  Surgeon: Nelva Bush, MD;  Location: [place] CV LAB;  Service: Cardiovascular;  Laterality: N/A;  . LEFT HEART CATH AND CORONARY ANGIOGRAPHY N/A 08/27/2016   Procedure: Left Heart Cath and Coronary Angiography;  Surgeon: Teodoro Spray, MD;  Location: [place] CV LAB;  Service: Cardiovascular;  Laterality: N/A;   Family History  Problem Relation Age of Onset  . Coronary artery disease Mother   . Alzheimer's disease Mother   . Coronary artery disease Father 12  . Heart attack Father   . Throat cancer Brother   . Lung cancer Maternal Aunt   . Kidney cancer Daughter   . Cervical  cancer Daughter    Social History   Socioeconomic History  . Marital status: Widowed    Spouse name: Not on file  . Number of children: Not on file  . Years of education: Not on file  . Highest education level: Not on file  Occupational History  . Not on file  Social Needs  . Financial resource strain: Not on file  . Food insecurity:    Worry: Not on file    Inability: Not on file  . Transportation needs:    Medical: Not on file    Non-medical: Not on file  Tobacco Use  . Smoking status: Current Every Day Smoker    Packs/day: 0.50    Types: Cigarettes  . Smokeless tobacco: Never Used  . Tobacco comment: HAS BEEN SMOKING 30+ YEARS,RESUMED TOBACCO USE AFTER AAA REPAIR  Substance and Sexual Activity  . Alcohol use: No  . Drug use: No  . Sexual activity: Not on file  Lifestyle  . Physical activity:    Days per week: Not on file    Minutes per session: Not on file  . Stress: Not on file  Relationships  . Social connections:    Talks on phone: Not on file    Gets together: Not on file    Attends religious service: Not on file    Active member of club or organization: Not on file    Attends meetings of clubs or organizations: Not on file    Relationship status: Not on file  . Intimate partner violence:    Fear of current or ex partner: Not on file    Emotionally abused: Not on file    Physically abused: Not on file    Forced sexual activity: Not on file  Other Topics Concern  . Not on file  Social History Narrative   Lives with daughter and grandson and great grand   Current Meds  Medication Sig  . albuterol (PROVENTIL HFA;VENTOLIN HFA) 108 (90 Base) MCG/ACT inhaler Inhale 2 puffs into the lungs every 6 (six) hours as needed for wheezing or shortness of breath.  Marland Kitchen amoxicillin-clavulanate (AUGMENTIN) 875-125 MG tablet Take 1 tablet by mouth 2 (two) times daily for 10 days.  Marland Kitchen atorvastatin (LIPITOR) 40 MG tablet TAKE 1 TABLET(40 MG) BY MOUTH DAILY AT 6 PM  .  budesonide-formoterol (SYMBICORT) 80-4.5 MCG/ACT inhaler Inhale 2 puffs into the lungs 2 (two) times daily.  . clopidogrel (PLAVIX) 75 MG tablet TAKE 1 TABLET(75 MG) BY MOUTH DAILY  . diazepam (VALIUM) 5 MG tablet Take 1 tablet (5 mg total) by mouth 2 (two) times daily as needed.  . ferrous sulfate 325 (65 FE) MG tablet Take 1 tablet (325 mg total) by mouth daily with breakfast.  . HYDROcodone-acetaminophen (NORCO) 5-325 MG tablet Take 1 tablet by mouth 3 (three) times daily as needed for up to 7 doses for severe pain.  Marland Kitchen lisinopril (PRINIVIL,ZESTRIL) 20 MG tablet TAKE 1 TABLET BY MOUTH EVERY DAY  . lisinopril (PRINIVIL,ZESTRIL) 20 MG tablet TAKE 1 TABLET BY MOUTH EVERY DAY  . megestrol (MEGACE) 40 MG tablet TAKE 1 TABLET(40 MG) BY  MOUTH DAILY  . metoprolol tartrate (LOPRESSOR) 25 MG tablet Take 0.5 tablets (12.5 mg total) by mouth 2 (two) times daily.  . mirtazapine (REMERON) 45 MG tablet Take 1 tablet (45 mg total) by mouth at bedtime.  . nitroGLYCERIN (NITROSTAT) 0.4 MG SL tablet Place 1 tablet (0.4 mg total) under the tongue every 5 (five) minutes x 3 doses as needed for chest pain.  . pantoprazole (PROTONIX) 40 MG tablet Take 1 tablet (40 mg total) by mouth 2 (two) times daily.  Marland Kitchen PARoxetine (PAXIL) 30 MG tablet TAKE 1 TABLET BY MOUTH DAILY  . PARoxetine (PAXIL) 30 MG tablet TAKE 1 TABLET BY MOUTH DAILY  . SYMBICORT 80-4.5 MCG/ACT inhaler INHALE 2 PUFFS INTO THE LUNGS TWICE DAILY  . [DISCONTINUED] HYDROcodone-acetaminophen (NORCO) 5-325 MG tablet Take 1 tablet by mouth every 6 (six) hours as needed for up to 7 doses for severe pain.   Allergies  Allergen Reactions  . Morphine Sulfate Other (See Comments)    Reaction:  Hallucinations    Recent Results (from the past 2160 hour(s))  CBC with Differential     Status: Abnormal   Collection Time: 05/27/17  5:35 PM  Result Value Ref Range   WBC 6.5 3.6 - 11.0 K/uL   RBC 3.21 (L) 3.80 - 5.20 MIL/uL   Hemoglobin 9.5 (L) 12.0 - 16.0 g/dL    HCT 29.1 (L) 35.0 - 47.0 %   MCV 90.8 80.0 - 100.0 fL   MCH 29.5 26.0 - 34.0 pg   MCHC 32.5 32.0 - 36.0 g/dL   RDW 20.3 (H) 11.5 - 14.5 %   Platelets 154 150 - 440 K/uL   Neutrophils Relative % 84 %   Neutro Abs 5.4 1.4 - 6.5 K/uL   Lymphocytes Relative 7 %   Lymphs Abs 0.5 (L) 1.0 - 3.6 K/uL   Monocytes Relative 7 %   Monocytes Absolute 0.4 0.2 - 0.9 K/uL   Eosinophils Relative 2 %   Eosinophils Absolute 0.1 0 - 0.7 K/uL   Basophils Relative 0 %   Basophils Absolute 0.0 0 - 0.1 K/uL    Comment: Performed at [facility], [address] [place], [place] [address]  Comprehensive metabolic panel     Status: Abnormal   Collection Time: 05/27/17  5:35 PM  Result Value Ref Range   Sodium 138 135 - 145 mmol/L   Potassium 3.8 3.5 - 5.1 mmol/L   Chloride 107 101 - 111 mmol/L   CO2 24 22 - 32 mmol/L   Glucose, Bld 113 (H) 65 - 99 mg/dL   BUN 29 (H) 6 - 20 mg/dL   Creatinine, Ser 1.44 (H) 0.44 - 1.00 mg/dL   Calcium 8.2 (L) 8.9 - 10.3 mg/dL   Total Protein 5.8 (L) 6.5 - 8.1 g/dL   Albumin 3.2 (L) 3.5 - 5.0 g/dL   AST 19 15 - 41 U/L   ALT 13 (L) 14 - 54 U/L   Alkaline Phosphatase 77 38 - 126 U/L   Total Bilirubin 0.2 (L) 0.3 - 1.2 mg/dL   GFR calc non Af Amer 33 (L) >60 mL/min   GFR calc Af Amer 38 (L) >60 mL/min    Comment: (NOTE) The eGFR has been calculated using the CKD EPI equation. This calculation has not been validated in all clinical situations. eGFR's persistently <60 mL/min signify possible Chronic Kidney Disease.    Anion gap 7 5 - 15    Comment: Performed at [facility], [address] [place], [place] [address]  Lipase, blood     Status: None   Collection Time: 05/27/17  5:35 PM  Result Value Ref Range   Lipase 35 11 - 51 U/L    Comment: Performed at [facility], [address] [place], [place] [address]  CBC     Status: Abnormal   Collection Time: 05/27/17  9:04 PM  Result Value Ref Range   WBC 6.3 3.6 - 11.0 K/uL   RBC 3.42  (L) 3.80 - 5.20 MIL/uL   Hemoglobin 10.1 (L) 12.0 - 16.0 g/dL   HCT 31.0 (L) 35.0 - 47.0 %   MCV 90.8 80.0 - 100.0 fL   MCH 29.6 26.0 - 34.0 pg   MCHC 32.6 32.0 - 36.0 g/dL   RDW 20.3 (H) 11.5 - 14.5 %   Platelets 134 (L) 150 - 440 K/uL    Comment: COUNT MAY BE INACCURATE DUE TO FIBRIN CLUMPS. Performed at [facility], [address] [place], [place] [address]   Iron and TIBC     Status: Abnormal   Collection Time: 07/11/17 11:30 AM  Result Value Ref Range   Iron 56 28 - 170 ug/dL   TIBC 234 (L) 250 - 450 ug/dL   Saturation Ratios 24 10.4 - 31.8 %   UIBC 178 ug/dL    Comment: Performed at [facility], [address] [place], [place] [address]  Comprehensive metabolic panel     Status: Abnormal   Collection Time: 07/11/17 11:30 AM  Result Value Ref Range   Sodium 137 135 - 145 mmol/L   Potassium 4.0 3.5 - 5.1 mmol/L   Chloride 103 101 - 111 mmol/L   CO2 24 22 - 32 mmol/L   Glucose, Bld 114 (H) 65 - 99 mg/dL   BUN 25 (H) 6 - 20 mg/dL   Creatinine, Ser 1.22 (H) 0.44 - 1.00 mg/dL   Calcium 9.0 8.9 - 10.3 mg/dL   Total Protein 6.9 6.5 - 8.1 g/dL   Albumin 3.6 3.5 - 5.0 g/dL   AST 18 15 - 41 U/L   ALT 12 (L) 14 - 54 U/L   Alkaline Phosphatase 92 38 - 126 U/L   Total Bilirubin 0.5 0.3 - 1.2 mg/dL   GFR calc non Af Amer 40 (L) >60 mL/min   GFR calc Af Amer 46 (L) >60 mL/min    Comment: (NOTE) The eGFR has been calculated using the CKD EPI equation. This calculation has not been validated in all clinical situations. eGFR's persistently <60 mL/min signify possible Chronic Kidney Disease.    Anion gap 10 5 - 15    Comment: Performed at [facility], [address] [place], [place] [address]  Ferritin     Status: None   Collection Time: 07/11/17 11:30 AM  Result Value Ref Range   Ferritin 143 11 - 307 ng/mL    Comment: Performed at [facility], [address] [place], [place] [address]  CBC     Status: Abnormal   Collection Time:  07/11/17 11:30 AM  Result Value Ref Range   WBC 6.6 3.6 - 11.0 K/uL   RBC 3.85 3.80 - 5.20 MIL/uL   Hemoglobin 12.2 12.0 - 16.0 g/dL   HCT 36.0 35.0 - 47.0 %   MCV 93.4 80.0 - 100.0 fL   MCH 31.5 26.0 - 34.0 pg   MCHC 33.8 32.0 - 36.0 g/dL   RDW 15.9 (H) 11.5 - 14.5 %   Platelets 160  150 - 440 K/uL    Comment: Performed at [facility], [address] [place], [place] [address]  Basic metabolic panel     Status: Abnormal   Collection Time: 08/09/17  2:44 PM  Result Value Ref Range   Sodium 139 135 - 145 mmol/L   Potassium 4.1 3.5 - 5.1 mmol/L   Chloride 106 101 - 111 mmol/L   CO2 27 22 - 32 mmol/L   Glucose, Bld 102 (H) 65 - 99 mg/dL   BUN 23 (H) 6 - 20 mg/dL   Creatinine, Ser 1.18 (H) 0.44 - 1.00 mg/dL   Calcium 8.9 8.9 - 10.3 mg/dL   GFR calc non Af Amer 41 (L) >60 mL/min   GFR calc Af Amer 48 (L) >60 mL/min    Comment: (NOTE) The eGFR has been calculated using the CKD EPI equation. This calculation has not been validated in all clinical situations. eGFR's persistently <60 mL/min signify possible Chronic Kidney Disease.    Anion gap 6 5 - 15    Comment: Performed at [facility], [address] [place], [place] [address]  CBC with Differential     Status: Abnormal   Collection Time: 08/09/17  2:44 PM  Result Value Ref Range   WBC 4.1 3.6 - 11.0 K/uL   RBC 3.99 3.80 - 5.20 MIL/uL   Hemoglobin 12.8 12.0 - 16.0 g/dL   HCT 38.0 35.0 - 47.0 %   MCV 95.2 80.0 - 100.0 fL   MCH 32.0 26.0 - 34.0 pg   MCHC 33.6 32.0 - 36.0 g/dL   RDW 13.6 11.5 - 14.5 %   Platelets 165 150 - 440 K/uL   Neutrophils Relative % 70 %   Neutro Abs 2.9 1.4 - 6.5 K/uL   Lymphocytes Relative 19 %   Lymphs Abs 0.8 (L) 1.0 - 3.6 K/uL   Monocytes Relative 6 %   Monocytes Absolute 0.2 0.2 - 0.9 K/uL   Eosinophils Relative 4 %   Eosinophils Absolute 0.2 0 - 0.7 K/uL   Basophils Relative 1 %   Basophils Absolute 0.0 0 - 0.1 K/uL    Comment: Performed at [facility], [address] [place], [place] [address]  Hepatic function panel     Status: Abnormal   Collection Time: 08/09/17  2:44 PM  Result Value Ref Range   Total Protein 6.8 6.5 - 8.1 g/dL   Albumin 3.7 3.5 - 5.0 g/dL   AST 18 15 - 41 U/L   ALT 13 (L) 14 - 54 U/L   Alkaline Phosphatase 106 38 - 126 U/L   Total Bilirubin 0.3 0.3 - 1.2 mg/dL   Bilirubin, Direct <0.1 (L) 0.1 - 0.5 mg/dL   Indirect Bilirubin NOT CALCULATED 0.3 - 0.9 mg/dL    Comment: Performed at [facility], [address] [place], [place] [address]  Lipase, blood     Status: None   Collection Time: 08/09/17  2:44 PM  Result Value Ref Range   Lipase 34 11 - 51 U/L    Comment: Performed at [facility], [address] [place], [place] [address]  Urinalysis, Routine w reflex microscopic     Status: Abnormal   Collection Time: 08/09/17  2:45 PM  Result Value Ref Range   Color, Urine YELLOW (A) YELLOW   APPearance CLOUDY (A) CLEAR   Specific Gravity, Urine 1.008 1.005 - 1.030   pH 7.0 5.0 - 8.0   Glucose, UA NEGATIVE NEGATIVE mg/dL  Hgb urine dipstick SMALL (A) NEGATIVE   Bilirubin Urine NEGATIVE NEGATIVE   Ketones, ur NEGATIVE NEGATIVE mg/dL   Protein, ur NEGATIVE NEGATIVE mg/dL   Nitrite NEGATIVE NEGATIVE   Leukocytes, UA LARGE (A) NEGATIVE   RBC / HPF 6-30 0 - 5 RBC/hpf   WBC, UA TOO NUMEROUS TO COUNT 0 - 5 WBC/hpf   Bacteria, UA RARE (A) NONE SEEN   Squamous Epithelial / LPF 6-30 (A) NONE SEEN    Comment: Performed at [facility], [address] [place], [place] [address]  Urine culture     Status: Abnormal   Collection Time: 08/09/17  2:45 PM  Result Value Ref Range   Specimen Description      URINE, RANDOM Performed at [facility], [address], [place], [place] [address]    Special Requests      NONE Performed at [facility], [address] [place],  [address]    Culture MULTIPLE SPECIES PRESENT, SUGGEST RECOLLECTION (A)    Report Status  08/10/2017 FINAL    Objective  Body mass index is 20.6 kg/m. Wt Readings from Last 3 Encounters:  08/16/17 123 lb 12.8 oz (56.2 kg)  08/09/17 113 lb (51.3 kg)  07/11/17 113 lb 11.2 oz (51.6 kg)   Temp Readings from Last 3 Encounters:  08/16/17 98.2 F (36.8 C) (Oral)  08/09/17 99.1 F (37.3 C) (Oral)  07/11/17 (!) 97 F (36.1 C) (Tympanic)   BP Readings from Last 3 Encounters:  08/16/17 (!) 88/56  08/09/17 (!) 143/86  07/11/17 (!) 160/92   Pulse Readings from Last 3 Encounters:  08/16/17 83  08/09/17 74  07/11/17 67    Physical Exam  Constitutional: She is oriented to person, place, and time. Vital signs are normal. She appears well-developed and well-nourished. She is cooperative.  HENT:  Head: Normocephalic and atraumatic.  Mouth/Throat: Oropharynx is clear and moist and mucous membranes are normal.  Eyes: Pupils are equal, round, and reactive to light. Conjunctivae are normal.  Cardiovascular: Normal rate, regular rhythm and normal heart sounds.  Pulmonary/Chest: Effort normal.  Neurological: She is alert and oriented to person, place, and time. Gait normal.  BL walks with cane   Skin: Skin is warm and dry. Rash noted.     Shingles right mid back, flank and ab red with blistering   Psychiatric: She has a normal mood and affect. Her speech is normal and behavior is normal. Judgment and thought content normal. She exhibits abnormal recent memory.  Nursing note and vitals reviewed.   Assessment   1. Shingles  Plan   1. Valtrex bid x 2 weeks norco bid x 2 weeks  F/u in 2-3 weeks    Provider: Dr. Olivia Mackie McLean-Scocuzza-Internal Medicine

## 2017-08-21 DIAGNOSIS — J9611 Chronic respiratory failure with hypoxia: Secondary | ICD-10-CM | POA: Diagnosis not present

## 2017-08-21 DIAGNOSIS — J449 Chronic obstructive pulmonary disease, unspecified: Secondary | ICD-10-CM | POA: Diagnosis not present

## 2017-08-30 ENCOUNTER — Ambulatory Visit (INDEPENDENT_AMBULATORY_CARE_PROVIDER_SITE_OTHER): Payer: Medicare Other | Admitting: Internal Medicine

## 2017-08-30 ENCOUNTER — Encounter: Payer: Self-pay | Admitting: Internal Medicine

## 2017-08-30 VITALS — BP 80/58 | HR 81 | Temp 97.8°F | Resp 15 | Ht 65.0 in | Wt 120.8 lb

## 2017-08-30 DIAGNOSIS — G8921 Chronic pain due to trauma: Secondary | ICD-10-CM | POA: Diagnosis not present

## 2017-08-30 DIAGNOSIS — K5730 Diverticulosis of large intestine without perforation or abscess without bleeding: Secondary | ICD-10-CM | POA: Diagnosis not present

## 2017-08-30 DIAGNOSIS — R748 Abnormal levels of other serum enzymes: Secondary | ICD-10-CM | POA: Diagnosis not present

## 2017-08-30 DIAGNOSIS — G894 Chronic pain syndrome: Secondary | ICD-10-CM | POA: Diagnosis not present

## 2017-08-30 DIAGNOSIS — R634 Abnormal weight loss: Secondary | ICD-10-CM | POA: Diagnosis not present

## 2017-08-30 DIAGNOSIS — J9611 Chronic respiratory failure with hypoxia: Secondary | ICD-10-CM

## 2017-08-30 DIAGNOSIS — B029 Zoster without complications: Secondary | ICD-10-CM | POA: Diagnosis not present

## 2017-08-30 DIAGNOSIS — D500 Iron deficiency anemia secondary to blood loss (chronic): Secondary | ICD-10-CM

## 2017-08-30 DIAGNOSIS — I10 Essential (primary) hypertension: Secondary | ICD-10-CM

## 2017-08-30 MED ORDER — LIDOCAINE 5 % EX OINT: 1.0000 "application " | TOPICAL_OINTMENT | Freq: Every day | CUTANEOUS | 0 refills | Status: DC | PRN

## 2017-08-30 MED ORDER — HYDROCODONE-ACETAMINOPHEN 5-325 MG PO TABS: 1.0000 | ORAL_TABLET | Freq: Two times a day (BID) | ORAL | 0 refills | Status: AC

## 2017-08-30 MED ORDER — HYDROCODONE-ACETAMINOPHEN 5-325 MG PO TABS: 1.0000 | ORAL_TABLET | Freq: Four times a day (QID) | ORAL | 0 refills | Status: DC | PRN

## 2017-08-30 NOTE — Patient Instructions (Addendum)
Stop the lisinopril  immediately  Drink the Boost daily . Increase your gatorade liquid intake   Trial of lidocaine gel to painful area once daily  Return 2 weeks for RN visit to check BP and have labs

## 2017-08-30 NOTE — Progress Notes (Signed)
Subjective:  Patient ID: Anna Prince, female    DOB: 1931-07-02  Age: 84 y.o. MRN: [id number]  CC: The primary encounter diagnosis was Elevated liver enzymes. Diagnoses of Herpes zoster without complication, Diverticulosis, sigmoid, Iron deficiency anemia due to chronic blood loss, Essential hypertension, Chronic respiratory failure with hypoxia (HCC), Unintentional weight loss, Chronic pain syndrome, and Chronic pain due to injury were also pertinent to this visit.  HPI Anna Prince presents for follow up on recent episode of shingles, chronic pain  and failure to thrive.  Developed painful shingles /herpetic neuralgia on right side of thorax and back.  Treated on April 26 by [facility] with valtrex and norco (rx for #15 given )  .  Still in pain, Not eating,  Hypotensive today but not dizzy or short of breath or presyncopal.    3 lb weight loss . No appetite since the shingles began. Patient has been drinking a Boost  high protein  Every other day   Treated in  ED April 19 for diverticulitis. patient feels it was triggered byuse of Alka Seltzer Cold Plus .  Treated in ED on  Feb 4 for diarrhea   Last seen by me in December for hospital follow up  On  Dec 10 [facility] admission for GI bleed .  Patient still smoking.  Anxiety aggravated by anxiety about her daughter's prognosis,  Daughter has been diagnosed with renal cell CA and not operable due to to ischemic cardiomyopathy.     Outpatient Medications Prior to Visit  Medication Sig Dispense Refill  . albuterol (PROVENTIL HFA;VENTOLIN HFA) 108 (90 Base) MCG/ACT inhaler Inhale 2 puffs into the lungs every 6 (six) hours as needed for wheezing or shortness of breath. 1 Inhaler 2  . atorvastatin (LIPITOR) 40 MG tablet TAKE 1 TABLET(40 MG) BY MOUTH DAILY AT 6 PM 90 tablet 1  . budesonide-formoterol (SYMBICORT) 80-4.5 MCG/ACT inhaler Inhale 2 puffs into the lungs 2 (two) times daily. 3 Inhaler 1  . clopidogrel (PLAVIX) 75 MG tablet TAKE 1 TABLET(75  MG) BY MOUTH DAILY 90 tablet 1  . diazepam (VALIUM) 5 MG tablet Take 1 tablet (5 mg total) by mouth 2 (two) times daily as needed. 60 tablet 2  . ferrous sulfate 325 (65 FE) MG tablet Take 1 tablet (325 mg total) by mouth daily with breakfast. 30 tablet 3  . lisinopril (PRINIVIL,ZESTRIL) 20 MG tablet TAKE 1 TABLET BY MOUTH EVERY DAY 90 tablet 1  . megestrol (MEGACE) 40 MG tablet TAKE 1 TABLET(40 MG) BY MOUTH DAILY 90 tablet 0  . metoprolol tartrate (LOPRESSOR) 25 MG tablet Take 0.5 tablets (12.5 mg total) by mouth 2 (two) times daily. 60 tablet 0  . mirtazapine (REMERON) 45 MG tablet Take 1 tablet (45 mg total) by mouth at bedtime. 90 tablet 1  . nitroGLYCERIN (NITROSTAT) 0.4 MG SL tablet Place 1 tablet (0.4 mg total) under the tongue every 5 (five) minutes x 3 doses as needed for chest pain. 90 tablet 1  . ondansetron (ZOFRAN) 4 MG tablet Take 1 tablet (4 mg total) by mouth every 8 (eight) hours as needed for nausea or vomiting. 21 tablet 0  . pantoprazole (PROTONIX) 40 MG tablet Take 1 tablet (40 mg total) by mouth 2 (two) times daily. 60 tablet 2  . PARoxetine (PAXIL) 30 MG tablet TAKE 1 TABLET BY MOUTH DAILY 30 tablet 5  . SYMBICORT 80-4.5 MCG/ACT inhaler INHALE 2 PUFFS INTO THE LUNGS TWICE DAILY 10.2 g 0  . valACYclovir (  VALTREX) 1000 MG tablet Take 1 tablet (1,000 mg total) by mouth 2 (two) times daily. 28 tablet 0  . HYDROcodone-acetaminophen (NORCO) 5-325 MG tablet Take 1 tablet by mouth 3 (three) times daily as needed for up to 7 doses for severe pain. 15 tablet 0  . lisinopril (PRINIVIL,ZESTRIL) 20 MG tablet TAKE 1 TABLET BY MOUTH EVERY DAY (Patient not taking: Reported on 08/30/2017) 90 tablet 0  . PARoxetine (PAXIL) 30 MG tablet TAKE 1 TABLET BY MOUTH DAILY (Patient not taking: Reported on 08/30/2017) 90 tablet 1   No facility-administered medications prior to visit.     Review of Systems;  Patient denies headache, fevers, malaise, unintentional weight loss, skin rash, eye pain, sinus  congestion and sinus pain, sore throat, dysphagia,  hemoptysis , cough, dyspnea, wheezing, chest pain, palpitations, orthopnea, edema, abdominal pain, nausea, melena, diarrhea, constipation, flank pain, dysuria, hematuria, urinary  Frequency, nocturia, numbness, tingling, seizures,  Focal weakness, Loss of consciousness,  Tremor, insomnia, depression, anxiety, and suicidal ideation.      Objective:  BP (!) 80/58 (BP Location: Left Arm, Patient Position: Sitting, Cuff Size: Normal)   Pulse 81   Temp 97.8 F (36.6 C) (Oral)   Resp 15   Ht 5\' 5"  (1.651 m)   Wt 120 lb 12.8 oz (54.8 kg)   SpO2 96%   BMI 20.10 kg/m   BP Readings from Last 3 Encounters:  08/30/17 (!) 80/58  08/16/17 (!) 88/56  08/09/17 (!) 143/86    Wt Readings from Last 3 Encounters:  08/30/17 120 lb 12.8 oz (54.8 kg)  08/16/17 123 lb 12.8 oz (56.2 kg)  08/09/17 113 lb (51.3 kg)    General appearance: alert, cooperative and appears stated age Ears: normal TM's and external ear canals both ears Throat: lips, mucosa, and tongue normal; teeth and gums normal Neck: no adenopathy, no carotid bruit, supple, symmetrical, trachea midline and thyroid not enlarged, symmetric, no tenderness/mass/nodules Back: symmetric, no curvature. ROM normal. No CVA tenderness. Lungs: clear to auscultation bilaterally Heart: regular rate and rhythm, S1, S2 normal, no murmur, click, rub or gallop Abdomen: soft, non-tender; bowel sounds normal; no masses,  no organomegaly Pulses: 2+ and symmetric Skin: Skin color, texture, turgor normal. No rashes or lesions Lymph nodes: Cervical, supraclavicular, and axillary nodes normal.  Lab Results  Component Value Date   HGBA1C 5.6 08/29/2016    Lab Results  Component Value Date   CREATININE 1.18 (H) 08/09/2017   CREATININE 1.22 (H) 07/11/2017   CREATININE 1.44 (H) 05/27/2017    Lab Results  Component Value Date   WBC 4.1 08/09/2017   HGB 12.8 08/09/2017   HCT 38.0 08/09/2017   PLT 165  08/09/2017   GLUCOSE 102 (H) 08/09/2017   CHOL 150 09/19/2016   TRIG 71.0 09/19/2016   HDL 39.90 09/19/2016   LDLDIRECT 75.0 09/19/2016   LDLCALC 95 09/19/2016   ALT 13 (L) 08/09/2017   AST 18 08/09/2017   NA 139 08/09/2017   K 4.1 08/09/2017   CL 106 08/09/2017   CREATININE 1.18 (H) 08/09/2017   BUN 23 (H) 08/09/2017   CO2 27 08/09/2017   TSH 0.44 11/04/2015   INR 1.01 03/28/2017   HGBA1C 5.6 08/29/2016    Ct Renal Stone Study  Result Date: 08/09/2017 CLINICAL DATA:  Right flank pain. EXAM: CT ABDOMEN AND PELVIS WITHOUT CONTRAST TECHNIQUE: Multidetector CT imaging of the abdomen and pelvis was performed following the standard protocol without IV contrast. COMPARISON:  11/05/2016 FINDINGS: Lower chest: Mild bibasilar  scarring. Similar moderate-sized sliding hiatal hernia. Hepatobiliary: Unchanged 4.8 cm left hepatic lobe cyst. Punctate stone in the region of the gallbladder neck without gross gallbladder wall thickening or pericholecystic inflammatory change. No biliary dilatation. Pancreas: Unremarkable. Spleen: Unremarkable. Adrenals/Urinary Tract: Unremarkable adrenal glands. Unchanged punctate calcification in the right renal hilum which is likely vascular. No hydronephrosis or definite urinary tract calculi. Unremarkable bladder. Stomach/Bowel: Moderate amount of stool in the colon, particularly in the cecum and ascending colon. Mild fat stranding around the ascending colon is similar to the prior study without underlying colonic wall thickening. Extensive sigmoid colon diverticulosis is noted with mild fat stranding adjacent to the proximal sigmoid colon (for example series 2, image 43). There is fecalization of some small bowel loops suggestive of slow transit. No bowel dilatation is seen to suggest obstruction. Vascular/Lymphatic: Extensive atherosclerosis of the abdominal aorta and its major branch vessels. Ectatic suprarenal aorta is unchanged with 3.0 cm maximal diameter. No enlarged  lymph nodes. Reproductive: Uterus and bilateral adnexa are unremarkable. Other: No intraperitoneal free fluid. Musculoskeletal: Severe lumbar levoscoliosis. Bilateral L5 pars defects with unchanged grade 2 anterolisthesis. Chronic L2 scratch sec chronic L1 compression fracture. IMPRESSION: 1. Mild fat stranding around the ascending colon, similar to the prior study and without significant colonic wall thickening though recurrent mild colitis is a consideration. 2. Extensive sigmoid colon diverticulosis with mild surrounding fat stranding proximally, query mild diverticulitis. 3. No hydronephrosis or definite urinary tract calculi. 4. Unchanged suprarenal abdominal aortic ectasia. 5.  Aortic Atherosclerosis (ICD10-I70.0). Electronically Signed   By: Logan Bores M.D.   On: 08/09/2017 16:02    Assessment & Plan:   Problem List Items Addressed This Visit    Unintentional weight loss    Body mass index is 20.1 kg/m. she has lost 3 more lbs due to anorexia aggravated by the pain of herpetic neuralgia and anxiety regarding her daughter's recent diagnosisof non operable renal cell CA.  She isa alrady taking Megace.  Encouraged to increase use of supplemental protein shakes to daily  Lab Results  Component Value Date   LABPROT 13.2 03/28/2017         Iron deficiency anemia    Secondary to GI bleed in December.  Referred to Hematology for iron transfusions    Last seen in march  Patient's hemoglobin and iron stores were  now within normal limits.   She did not require additional IV Feraheme today.  Patient's most recent infusion occurred on May 15, 2017. She has been instructed to continue her oral iron supplementation.  Return to clinic in 4 months with repeat laboratory work and further evaluation.  Lab Results  Component Value Date   WBC 4.1 08/09/2017   HGB 12.8 08/09/2017   HCT 38.0 08/09/2017   MCV 95.2 08/09/2017   PLT 165 08/09/2017          Hypertension    She is hypotensive today  due to decreased oral intake. Lisinopril suspended today until systolic bp is > 696      Herpes zoster without complication    Adding lidocaine gel for topical relief of pain.  Vicodin refilled for 3 months after Refill history confirmed via [place] Controlled Substance databas, accessed by me today..      Relevant Medications   HYDROcodone-acetaminophen (NORCO) 5-325 MG tablet   RESOLVED: Elevated liver enzymes - Primary   Relevant Orders   Comprehensive metabolic panel   Diverticulosis, sigmoid    With recent episode treated in ED 3 weeks ago  and sent home.       Chronic respiratory failure with hypoxia (HCC)    Secondary to late stage COPd with ongoing tobacco abuse.  She is aware of the risks of smoking while using supplemental oxygen.      RESOLVED: Chronic pain syndrome    She appears comfortable and more alert since she ran out of her narcotic.  advise to continue tylenol       Chronic pain due to injury    She has chronic pain from osteoporotic fractures and DJD hips.  She is not a candidate for NSAIDs due to history of ulcer..Refill history confirmed via [place] Controlled Substance databas, accessed by me today..refill of Vicodin for use bid given for 3 months   Lab Results  Component Value Date   CREATININE 1.18 (H) 08/09/2017         Relevant Medications   HYDROcodone-acetaminophen (NORCO) 5-325 MG tablet   HYDROcodone-acetaminophen (NORCO/VICODIN) 5-325 MG tablet   HYDROcodone-acetaminophen (NORCO/VICODIN) 5-325 MG tablet      I have changed Anna Prince's HYDROcodone-acetaminophen. I am also having her start on lidocaine. Additionally, I am having her maintain her albuterol, metoprolol tartrate, ferrous sulfate, nitroGLYCERIN, budesonide-formoterol, mirtazapine, pantoprazole, lisinopril, PARoxetine, megestrol, atorvastatin, SYMBICORT, diazepam, clopidogrel, valACYclovir, ondansetron, HYDROcodone-acetaminophen, and HYDROcodone-acetaminophen.  Meds ordered this  encounter  Medications  . HYDROcodone-acetaminophen (NORCO) 5-325 MG tablet    Sig: Take 1 tablet by mouth 2 (two) times daily for 7 doses.    Dispense:  60 tablet    Refill:  0    Spinal stenosis,  ckd chronic pain  . HYDROcodone-acetaminophen (NORCO/VICODIN) 5-325 MG tablet    Sig: Take 1 tablet by mouth every 6 (six) hours as needed for moderate pain. Maximum 2 daily.    Dispense:  62 tablet    Refill:  0    May refill on or after September 28 2017  . HYDROcodone-acetaminophen (NORCO/VICODIN) 5-325 MG tablet    Sig: Take 1 tablet by mouth every 6 (six) hours as needed for moderate pain. Maximum 2 daily.    Dispense:  62 tablet    Refill:  0    May refill on or after October 30 2017  . lidocaine (XYLOCAINE) 5 % ointment    Sig: Apply 1 application topically daily as needed.    Dispense:  2500 g    Refill:  0    Medications Discontinued During This Encounter  Medication Reason  . lisinopril (PRINIVIL,ZESTRIL) 20 MG tablet Duplicate  . PARoxetine (PAXIL) 30 MG tablet Duplicate  . HYDROcodone-acetaminophen (NORCO) 5-325 MG tablet Reorder    Follow-up: Return in about 3 months (around 11/30/2017).   Crecencio Mc, MD

## 2017-09-01 ENCOUNTER — Encounter: Payer: Self-pay | Admitting: Internal Medicine

## 2017-09-01 DIAGNOSIS — G8921 Chronic pain due to trauma: Secondary | ICD-10-CM | POA: Insufficient documentation

## 2017-09-01 DIAGNOSIS — R634 Abnormal weight loss: Secondary | ICD-10-CM | POA: Insufficient documentation

## 2017-09-01 NOTE — Assessment & Plan Note (Signed)
Adding lidocaine gel for topical relief of pain.  Vicodin refilled for 3 months after Refill history confirmed via [place] Controlled Substance databas, accessed by me today.Marland Kitchen

## 2017-09-01 NOTE — Assessment & Plan Note (Addendum)
Secondary to GI bleed in December.  Referred to Hematology for iron transfusions    Last seen in march  Patient's hemoglobin and iron stores were  now within normal limits.   She did not require additional IV Feraheme today.  Patient's most recent infusion occurred on May 15, 2017. She has been instructed to continue her oral iron supplementation.  Return to clinic in 4 months with repeat laboratory work and further evaluation.  Lab Results  Component Value Date   WBC 4.1 08/09/2017   HGB 12.8 08/09/2017   HCT 38.0 08/09/2017   MCV 95.2 08/09/2017   PLT 165 08/09/2017

## 2017-09-01 NOTE — Assessment & Plan Note (Signed)
Secondary to late stage COPd with ongoing tobacco abuse.  She is aware of the risks of smoking while using supplemental oxygen.

## 2017-09-01 NOTE — Assessment & Plan Note (Signed)
She is hypotensive today due to decreased oral intake. Lisinopril suspended today until systolic bp is > 157

## 2017-09-01 NOTE — Assessment & Plan Note (Signed)
Body mass index is 20.1 kg/m. she has lost 3 more lbs due to anorexia aggravated by the pain of herpetic neuralgia and anxiety regarding her daughter's recent diagnosisof non operable renal cell CA.  She isa alrady taking Megace.  Encouraged to increase use of supplemental protein shakes to daily  Lab Results  Component Value Date   LABPROT 13.2 03/28/2017

## 2017-09-01 NOTE — Assessment & Plan Note (Signed)
She appears comfortable and more alert since she ran out of her narcotic.  advise to continue tylenol

## 2017-09-01 NOTE — Assessment & Plan Note (Signed)
She has chronic pain from osteoporotic fractures and DJD hips.  She is not a candidate for NSAIDs due to history of ulcer..Refill history confirmed via [place] Controlled Substance databas, accessed by me today..refill of Vicodin for use bid given for 3 months   Lab Results  Component Value Date   CREATININE 1.18 (H) 08/09/2017

## 2017-09-01 NOTE — Assessment & Plan Note (Signed)
With recent episode treated in ED 3 weeks ago and sent home.

## 2017-09-03 ENCOUNTER — Telehealth: Payer: Self-pay

## 2017-09-03 NOTE — Telephone Encounter (Signed)
PA for lidocaine ointment has been submitted on covermymeds.

## 2017-09-12 ENCOUNTER — Other Ambulatory Visit: Payer: Medicare Other

## 2017-09-12 ENCOUNTER — Ambulatory Visit: Payer: Medicare Other

## 2017-09-16 ENCOUNTER — Other Ambulatory Visit: Payer: Self-pay | Admitting: Internal Medicine

## 2017-09-19 ENCOUNTER — Other Ambulatory Visit (INDEPENDENT_AMBULATORY_CARE_PROVIDER_SITE_OTHER): Payer: Medicare Other

## 2017-09-19 ENCOUNTER — Ambulatory Visit (INDEPENDENT_AMBULATORY_CARE_PROVIDER_SITE_OTHER): Payer: Medicare Other | Admitting: *Deleted

## 2017-09-19 ENCOUNTER — Encounter: Payer: Self-pay | Admitting: *Deleted

## 2017-09-19 DIAGNOSIS — R748 Abnormal levels of other serum enzymes: Secondary | ICD-10-CM | POA: Diagnosis not present

## 2017-09-19 DIAGNOSIS — I959 Hypotension, unspecified: Secondary | ICD-10-CM | POA: Diagnosis not present

## 2017-09-19 LAB — COMPREHENSIVE METABOLIC PANEL WITH GFR
ALT: 10 U/L (ref 0–35)
AST: 16 U/L (ref 0–37)
Albumin: 3.7 g/dL (ref 3.5–5.2)
Alkaline Phosphatase: 80 U/L (ref 39–117)
BUN: 23 mg/dL (ref 6–23)
CO2: 26 meq/L (ref 19–32)
Calcium: 9.2 mg/dL (ref 8.4–10.5)
Chloride: 105 meq/L (ref 96–112)
Creatinine, Ser: 1.32 mg/dL — ABNORMAL HIGH (ref 0.40–1.20)
GFR: 40.79 mL/min — ABNORMAL LOW
Glucose, Bld: 90 mg/dL (ref 70–99)
Potassium: 4.4 meq/L (ref 3.5–5.1)
Sodium: 139 meq/L (ref 135–145)
Total Bilirubin: 0.4 mg/dL (ref 0.2–1.2)
Total Protein: 6.8 g/dL (ref 6.0–8.3)

## 2017-09-19 NOTE — Progress Notes (Addendum)
Patient in for 2 week follow up after stopping lisinopril  For hypotension. BP taken in left arm 100/76 pulse 71, right arm BP 102/74 pulse 70. Encouraged patient to try and drink more fluids after she stated she doesn not want to drink or eat using ensure twice daily and a gatorade   I have reviewed the above information and agree with above.   Deborra Medina, MD.

## 2017-09-21 DIAGNOSIS — J9611 Chronic respiratory failure with hypoxia: Secondary | ICD-10-CM | POA: Diagnosis not present

## 2017-09-21 DIAGNOSIS — J449 Chronic obstructive pulmonary disease, unspecified: Secondary | ICD-10-CM | POA: Diagnosis not present

## 2017-10-16 ENCOUNTER — Other Ambulatory Visit: Payer: Self-pay | Admitting: Adult Health

## 2017-10-21 ENCOUNTER — Telehealth: Payer: Self-pay | Admitting: Internal Medicine

## 2017-10-21 DIAGNOSIS — J9611 Chronic respiratory failure with hypoxia: Secondary | ICD-10-CM | POA: Diagnosis not present

## 2017-10-21 DIAGNOSIS — J449 Chronic obstructive pulmonary disease, unspecified: Secondary | ICD-10-CM | POA: Diagnosis not present

## 2017-10-21 NOTE — Telephone Encounter (Signed)
Copied from [place] [id number]. Topic: Quick Communication - Rx Refill/Question >> Oct 21, 2017  1:45 PM Bea Graff, NT wrote: Medication: lisinopril (PRINIVIL,ZESTRIL) 20 MG tablet  Has the patient contacted their pharmacy? Yes.   (Agent: If no, request that the patient contact the pharmacy for the refill.) (Agent: If yes, when and what did the pharmacy advise?)  Preferred Pharmacy (with phone number or street name): [facility] [place], [address] [place] AT [address] [phone] (Phone) [phone] (Fax)      Agent: Please be advised that RX refills may take up to 3 business days. We ask that you follow-up with your pharmacy.

## 2017-10-22 ENCOUNTER — Other Ambulatory Visit: Payer: Self-pay

## 2017-10-22 NOTE — Telephone Encounter (Signed)
Refilled: 07/17/2017 Last OV: 08/30/2017 Next OV: 12/02/2017

## 2017-10-22 NOTE — Telephone Encounter (Signed)
Please advise 

## 2017-10-22 NOTE — Telephone Encounter (Signed)
Refill request for Lisinopril.  Med held 2/95/62 until systolic >130. Last reading in office on 09/19/17... 102/74. Called pt; she has no other readings. Please advise: [phone]

## 2017-10-22 NOTE — Telephone Encounter (Signed)
Do not resume medication unless home readings are > 150

## 2017-10-23 MED ORDER — DIAZEPAM 5 MG PO TABS: 5.0000 mg | ORAL_TABLET | Freq: Two times a day (BID) | ORAL | 2 refills | Status: DC | PRN

## 2017-10-23 NOTE — Telephone Encounter (Signed)
Spoke with pt's daughter [name] and informed her that the pt does not need to take the lisinopril unless her blood pressure goes above 150. The daughter stated that she was aware of that and that the pt has not been taking the lisinopril.

## 2017-10-23 NOTE — Telephone Encounter (Signed)
Printed, signed and faxed.  

## 2017-10-23 NOTE — Telephone Encounter (Signed)
Refill authorized,  No prior request in chart

## 2017-10-23 NOTE — Telephone Encounter (Signed)
Patient daughter is calling to check on the status of this. She states they originally asked for a refill last week. Please advise.

## 2017-11-07 ENCOUNTER — Other Ambulatory Visit: Payer: Self-pay | Admitting: *Deleted

## 2017-11-07 DIAGNOSIS — D649 Anemia, unspecified: Secondary | ICD-10-CM

## 2017-11-11 ENCOUNTER — Inpatient Hospital Stay: Payer: Medicare Other

## 2017-11-11 ENCOUNTER — Inpatient Hospital Stay: Payer: Medicare Other | Admitting: Oncology

## 2017-11-11 NOTE — Progress Notes (Deleted)
[place]  [phone]) [phone] Fax:(336) [phone]  ID: [name] OB: Aug 25, 1931  MR#: [id number]  NOM#:[id number]  Patient Care Team: Crecencio Mc, MD as PCP - General (Internal Medicine) Lin Landsman, MD as Consulting Physician (Gastroenterology) Stanford Breed Denice Bors, MD as Consulting Physician (Cardiology)  CHIEF COMPLAINT: Iron deficiency anemia.  INTERVAL HISTORY: Patient returns to clinic today for repeat laboratory work and further evaluation.  She continues to have mild weakness and fatigue, but admits this has improved.  She otherwise feels well. She has no neurologic complaints.  She denies any recent fevers or illnesses.  She has a good appetite and denies weight loss.  She has no chest pain or shortness of breath.  She denies any nausea, vomiting, constipation, or diarrhea.  She has no melena or hematochezia.  She has no urinary complaints.  Patient offers no specific complaints today.  REVIEW OF SYSTEMS:   Review of Systems  Constitutional: Positive for malaise/fatigue. Negative for fever and weight loss.  Respiratory: Negative.  Negative for cough and shortness of breath.   Cardiovascular: Negative.  Negative for chest pain and leg swelling.  Gastrointestinal: Negative.  Negative for abdominal pain, blood in stool and melena.  Genitourinary: Negative.  Negative for hematuria.  Musculoskeletal: Negative.   Skin: Negative.  Negative for rash.  Neurological: Positive for weakness. Negative for sensory change and focal weakness.  Psychiatric/Behavioral: Negative.  The patient is not nervous/anxious.     As per HPI. Otherwise, a complete review of systems is negative.  PAST MEDICAL HISTORY: Past Medical History:  Diagnosis Date  . AAA (abdominal aortic aneurysm) ([place])   . Anxiety   . Aortic aneurysm ([place])   . Arthritis   . Barrett's esophagus 2007  . Bursitis 2012   left hip, improved with periodic steroid injection [facility])  . COPD (chronic obstructive pulmonary disease) ([place])   . Coronary artery disease    a. LHC (08/27/16): 15% ostial LM, 99% pLAD and D1, Ramus & LCx nl, m/dRCA diffuse 80-90%, PDA occluded proximally, fills via L-R collats, EF 50-55%; b. PCI (08/31/16): Severe pLAD dz involving D1, successful orbital atherectomy PCI to pLAD w/ Synergy 3.0 x 20 mm DES, 0% residual stenosis & TIMI 3 flow, occluded ostD1, medically managed given lack of CP  . Depression   . Diastolic dysfunction    a. TTE (08/25/16): Mild LVH with LVEF of 50-55% and grade 1 diastolic dysfunction. Mild MR. Mild right atrial enlargement. Normal RV size and function  . GERD (gastroesophageal reflux disease)    with Barretts Esophagus  . GI bleeding    a. required pRBC May, July, and December 2018  . Hyperlipidemia   . Hypertension   . Iron deficiency anemia 2012  . Lumbago   . Macular degeneration   . Osteoporosis   . Shingles    right back/abdomen 08/16/17   . Sigmoid diverticulosis    by colonoscopy  . Stroke [facility])     PAST SURGICAL HISTORY: Past Surgical History:  Procedure Laterality Date  . ABDOMINAL AORTIC ANEURYSM REPAIR  July 2010   [facility]  . APPENDECTOMY    . BACK SURGERY     X2..1975 arachnoid cyst cervical region([place]);1997 lumbosacral tumor,9 hr surgery  . CARDIAC CATHETERIZATION    . CARDIAC CATHETERIZATION    . cardiac stents    . COLONOSCOPY N/A 03/30/2017   Procedure: COLONOSCOPY;  Surgeon: Lin Landsman, MD;  Location: [facility] ENDOSCOPY;  Service: Gastroenterology;  Laterality:  N/A;  . CORONARY ATHERECTOMY N/A 08/31/2016   Procedure: Coronary Atherectomy;  Surgeon: Nelva Bush, MD;  Location: [place] CV LAB;  Service: Cardiovascular;  Laterality: N/A;  . ESOPHAGOGASTRODUODENOSCOPY N/A 03/29/2017   Procedure: ESOPHAGOGASTRODUODENOSCOPY (EGD);  Surgeon: Virgel Manifold, MD;  Location: [facility] ENDOSCOPY;  Service: Endoscopy;  Laterality: N/A;  . ESOPHAGOGASTRODUODENOSCOPY (EGD) WITH  PROPOFOL N/A 09/02/2016   Procedure: ESOPHAGOGASTRODUODENOSCOPY (EGD) WITH PROPOFOL;  Surgeon: Otis Brace, MD;  Location: [facility] ENDOSCOPY;  Service: Gastroenterology;  Laterality: N/A;  . ESOPHAGOGASTRODUODENOSCOPY (EGD) WITH PROPOFOL N/A 11/06/2016   Procedure: ESOPHAGOGASTRODUODENOSCOPY (EGD) WITH PROPOFOL;  Surgeon: Lucilla Lame, MD;  Location: [facility] ENDOSCOPY;  Service: Endoscopy;  Laterality: N/A;  . FLEXIBLE SIGMOIDOSCOPY N/A 03/29/2017   Procedure: FLEXIBLE SIGMOIDOSCOPY;  Surgeon: Virgel Manifold, MD;  Location: [facility] ENDOSCOPY;  Service: Endoscopy;  Laterality: N/A;  . INTRAVASCULAR ULTRASOUND/IVUS N/A 08/31/2016   Procedure: Intravascular Ultrasound/IVUS;  Surgeon: Nelva Bush, MD;  Location: [place] CV LAB;  Service: Cardiovascular;  Laterality: N/A;  . LEFT HEART CATH AND CORONARY ANGIOGRAPHY N/A 08/27/2016   Procedure: Left Heart Cath and Coronary Angiography;  Surgeon: Teodoro Spray, MD;  Location: [place] CV LAB;  Service: Cardiovascular;  Laterality: N/A;    FAMILY HISTORY: Family History  Problem Relation Age of Onset  . Coronary artery disease Mother   . Alzheimer's disease Mother   . Coronary artery disease Father 97  . Heart attack Father   . Throat cancer Brother   . Lung cancer Maternal Aunt   . Kidney cancer Daughter   . Cervical cancer Daughter     ADVANCED DIRECTIVES (Y/N):  N  HEALTH MAINTENANCE: Social History   Tobacco Use  . Smoking status: Current Every Day Smoker    Packs/day: 0.50    Types: Cigarettes  . Smokeless tobacco: Never Used  . Tobacco comment: HAS BEEN SMOKING 30+ YEARS,RESUMED TOBACCO USE AFTER AAA REPAIR  Substance Use Topics  . Alcohol use: No  . Drug use: No     Colonoscopy:  PAP:  Bone density:  Lipid panel:  Allergies  Allergen Reactions  . Morphine Sulfate Other (See Comments)    Reaction:  Hallucinations     Current Outpatient Medications  Medication Sig Dispense Refill  . albuterol (PROVENTIL  HFA;VENTOLIN HFA) 108 (90 Base) MCG/ACT inhaler Inhale 2 puffs into the lungs every 6 (six) hours as needed for wheezing or shortness of breath. 1 Inhaler 2  . atorvastatin (LIPITOR) 40 MG tablet TAKE 1 TABLET(40 MG) BY MOUTH DAILY AT 6 PM 90 tablet 1  . budesonide-formoterol (SYMBICORT) 80-4.5 MCG/ACT inhaler Inhale 2 puffs into the lungs 2 (two) times daily. 3 Inhaler 1  . clopidogrel (PLAVIX) 75 MG tablet TAKE 1 TABLET(75 MG) BY MOUTH DAILY 90 tablet 1  . diazepam (VALIUM) 5 MG tablet Take 1 tablet (5 mg total) by mouth 2 (two) times daily as needed for anxiety. 60 tablet 2  . ferrous sulfate 325 (65 FE) MG tablet Take 1 tablet (325 mg total) by mouth daily with breakfast. 30 tablet 3  . HYDROcodone-acetaminophen (NORCO/VICODIN) 5-325 MG tablet Take 1 tablet by mouth every 6 (six) hours as needed for moderate pain. Maximum 2 daily. 62 tablet 0  . HYDROcodone-acetaminophen (NORCO/VICODIN) 5-325 MG tablet Take 1 tablet by mouth every 6 (six) hours as needed for moderate pain. Maximum 2 daily. 62 tablet 0  . lidocaine (XYLOCAINE) 5 % ointment Apply 1 application topically daily as needed. 2500 g 0  . lisinopril (PRINIVIL,ZESTRIL) 20 MG  tablet TAKE 1 TABLET BY MOUTH EVERY DAY 90 tablet 1  . megestrol (MEGACE) 40 MG tablet TAKE 1 TABLET(40 MG) BY MOUTH DAILY 90 tablet 0  . metoprolol tartrate (LOPRESSOR) 25 MG tablet Take 0.5 tablets (12.5 mg total) by mouth 2 (two) times daily. 60 tablet 0  . mirtazapine (REMERON) 45 MG tablet Take 1 tablet (45 mg total) by mouth at bedtime. 90 tablet 1  . nitroGLYCERIN (NITROSTAT) 0.4 MG SL tablet Place 1 tablet (0.4 mg total) under the tongue every 5 (five) minutes x 3 doses as needed for chest pain. 90 tablet 1  . ondansetron (ZOFRAN) 4 MG tablet Take 1 tablet (4 mg total) by mouth every 8 (eight) hours as needed for nausea or vomiting. 21 tablet 0  . pantoprazole (PROTONIX) 40 MG tablet Take 1 tablet (40 mg total) by mouth 2 (two) times daily. 60 tablet 2  .  pantoprazole (PROTONIX) 40 MG tablet TAKE 1 TABLET(40 MG) BY MOUTH DAILY 90 tablet 0  . PARoxetine (PAXIL) 30 MG tablet TAKE 1 TABLET BY MOUTH DAILY 30 tablet 5  . SYMBICORT 80-4.5 MCG/ACT inhaler INHALE 2 PUFFS INTO THE LUNGS TWICE DAILY 10.2 g 2  . valACYclovir (VALTREX) 1000 MG tablet Take 1 tablet (1,000 mg total) by mouth 2 (two) times daily. 28 tablet 0   No current facility-administered medications for this visit.     OBJECTIVE: There were no vitals filed for this visit.   There is no height or weight on file to calculate BMI.    ECOG FS:0 - Asymptomatic  General: Well-developed, well-nourished, no acute distress. Eyes: Pink conjunctiva, anicteric sclera. Lungs: Clear to auscultation bilaterally. Heart: Regular rate and rhythm. No rubs, murmurs, or gallops. Abdomen: Soft, nontender, nondistended. No organomegaly noted, normoactive bowel sounds. Musculoskeletal: No edema, cyanosis, or clubbing. Neuro: Alert, answering all questions appropriately. Cranial nerves grossly intact. Skin: No rashes or petechiae noted. Psych: Normal affect.  LAB RESULTS:  Lab Results  Component Value Date   NA 139 09/19/2017   K 4.4 09/19/2017   CL 105 09/19/2017   CO2 26 09/19/2017   GLUCOSE 90 09/19/2017   BUN 23 09/19/2017   CREATININE 1.32 (H) 09/19/2017   CALCIUM 9.2 09/19/2017   PROT 6.8 09/19/2017   ALBUMIN 3.7 09/19/2017   AST 16 09/19/2017   ALT 10 09/19/2017   ALKPHOS 80 09/19/2017   BILITOT 0.4 09/19/2017   GFRNONAA 41 (L) 08/09/2017   GFRAA 48 (L) 08/09/2017    Lab Results  Component Value Date   WBC 4.1 08/09/2017   NEUTROABS 2.9 08/09/2017   HGB 12.8 08/09/2017   HCT 38.0 08/09/2017   MCV 95.2 08/09/2017   PLT 165 08/09/2017   Lab Results  Component Value Date   IRON 56 07/11/2017   TIBC 234 (L) 07/11/2017   IRONPCTSAT 24 07/11/2017   Lab Results  Component Value Date   FERRITIN 143 07/11/2017     STUDIES: No results found.  ASSESSMENT: Iron deficiency  anemia.  PLAN:    1. Iron deficiency anemia: Patient's hemoglobin and iron stores are now within normal limits.  Previously, the remainder of her laboratory work was also within normal limits.  She does not require additional IV Feraheme today.  Patient's most recent infusion occurred on May 15, 2017. She has been instructed to continue her oral iron supplementation.  Return to clinic in 4 months with repeat laboratory work and further evaluation. 2.  Renal insufficiency: Patient's creatinine appears to be approximately her baseline.  3.  Hypertension: Patient's blood pressure is mildly elevated today.  Continue monitoring and treatment per primary care.  Approximately 20 minutes was spent in discussion of which greater than 50% was consultation.  Patient expressed understanding and was in agreement with this plan. She also understands that She can call clinic at any time with any questions, concerns, or complaints.    Lloyd Huger, MD   11/11/2017 9:42 AM

## 2017-11-21 DIAGNOSIS — J449 Chronic obstructive pulmonary disease, unspecified: Secondary | ICD-10-CM | POA: Diagnosis not present

## 2017-11-21 DIAGNOSIS — J9611 Chronic respiratory failure with hypoxia: Secondary | ICD-10-CM | POA: Diagnosis not present

## 2017-12-02 ENCOUNTER — Ambulatory Visit (INDEPENDENT_AMBULATORY_CARE_PROVIDER_SITE_OTHER): Payer: Medicare Other | Admitting: Internal Medicine

## 2017-12-02 ENCOUNTER — Encounter: Payer: Self-pay | Admitting: Internal Medicine

## 2017-12-02 VITALS — BP 136/84 | HR 65 | Temp 98.1°F | Resp 17 | Ht 65.0 in | Wt 119.6 lb

## 2017-12-02 DIAGNOSIS — K5730 Diverticulosis of large intestine without perforation or abscess without bleeding: Secondary | ICD-10-CM

## 2017-12-02 DIAGNOSIS — J432 Centrilobular emphysema: Secondary | ICD-10-CM

## 2017-12-02 DIAGNOSIS — Z716 Tobacco abuse counseling: Secondary | ICD-10-CM

## 2017-12-02 DIAGNOSIS — E559 Vitamin D deficiency, unspecified: Secondary | ICD-10-CM | POA: Diagnosis not present

## 2017-12-02 DIAGNOSIS — E785 Hyperlipidemia, unspecified: Secondary | ICD-10-CM

## 2017-12-02 DIAGNOSIS — I10 Essential (primary) hypertension: Secondary | ICD-10-CM | POA: Diagnosis not present

## 2017-12-02 DIAGNOSIS — D500 Iron deficiency anemia secondary to blood loss (chronic): Secondary | ICD-10-CM

## 2017-12-02 DIAGNOSIS — M545 Low back pain: Secondary | ICD-10-CM | POA: Diagnosis not present

## 2017-12-02 DIAGNOSIS — G8929 Other chronic pain: Secondary | ICD-10-CM

## 2017-12-02 LAB — VITAMIN D 25 HYDROXY (VIT D DEFICIENCY, FRACTURES): VITD: 21.34 ng/mL — ABNORMAL LOW (ref 30.00–100.00)

## 2017-12-02 LAB — CBC WITH DIFFERENTIAL/PLATELET
Basophils Absolute: 0 10*3/uL (ref 0.0–0.1)
Basophils Relative: 0.5 % (ref 0.0–3.0)
Eosinophils Absolute: 0.1 10*3/uL (ref 0.0–0.7)
Eosinophils Relative: 1.8 % (ref 0.0–5.0)
HCT: 35.6 % — ABNORMAL LOW (ref 36.0–46.0)
Hemoglobin: 12 g/dL (ref 12.0–15.0)
Lymphocytes Relative: 15.7 % (ref 12.0–46.0)
Lymphs Abs: 0.9 10*3/uL (ref 0.7–4.0)
MCHC: 33.7 g/dL (ref 30.0–36.0)
MCV: 95.1 fl (ref 78.0–100.0)
Monocytes Absolute: 0.3 10*3/uL (ref 0.1–1.0)
Monocytes Relative: 6.1 % (ref 3.0–12.0)
Neutro Abs: 4.2 10*3/uL (ref 1.4–7.7)
Neutrophils Relative %: 75.9 % (ref 43.0–77.0)
Platelets: 174 10*3/uL (ref 150.0–400.0)
RBC: 3.75 Mil/uL — ABNORMAL LOW (ref 3.87–5.11)
RDW: 12.8 % (ref 11.5–15.5)
WBC: 5.5 10*3/uL (ref 4.0–10.5)

## 2017-12-02 LAB — COMPREHENSIVE METABOLIC PANEL
ALBUMIN: 3.7 g/dL (ref 3.5–5.2)
ALT: 8 U/L (ref 0–35)
AST: 13 U/L (ref 0–37)
Alkaline Phosphatase: 91 U/L (ref 39–117)
BILIRUBIN TOTAL: 0.5 mg/dL (ref 0.2–1.2)
BUN: 25 mg/dL — ABNORMAL HIGH (ref 6–23)
CALCIUM: 9.3 mg/dL (ref 8.4–10.5)
CO2: 28 mEq/L (ref 19–32)
CREATININE: 1.32 mg/dL — AB (ref 0.40–1.20)
Chloride: 105 mEq/L (ref 96–112)
GFR: 40.77 mL/min — AB (ref >60.00)
Glucose, Bld: 97 mg/dL (ref 70–99)
Potassium: 4.4 mEq/L (ref 3.5–5.1)
Sodium: 140 mEq/L (ref 135–145)
TOTAL PROTEIN: 6.4 g/dL (ref 6.0–8.3)

## 2017-12-02 LAB — IRON: IRON: 78 ug/dL (ref 42–145)

## 2017-12-02 LAB — LIPID PANEL
CHOL/HDL RATIO: 2
CHOLESTEROL: 142 mg/dL (ref 0–200)
HDL: 56.9 mg/dL (ref >39.00)
LDL Cholesterol: 67 mg/dL (ref 0–99)
NonHDL: 85.07
TRIGLYCERIDES: 89 mg/dL (ref 0.0–149.0)
VLDL: 17.8 mg/dL (ref 0.0–40.0)

## 2017-12-02 MED ORDER — HYDROCODONE-ACETAMINOPHEN 5-325 MG PO TABS: 1.0000 | ORAL_TABLET | Freq: Four times a day (QID) | ORAL | 0 refills | Status: DC | PRN

## 2017-12-02 MED ORDER — LISINOPRIL 20 MG PO TABS: 20.0000 mg | ORAL_TABLET | Freq: Every day | ORAL | 1 refills | Status: DC

## 2017-12-02 NOTE — Progress Notes (Signed)
Subjective:  Patient ID: Anna Prince, female    DOB: Sep 08, 1931  Age: 84 y.o. MRN: [id number]  CC: The primary encounter diagnosis was Vitamin D deficiency. Diagnoses of Hyperlipidemia LDL goal <100, Essential hypertension, Iron deficiency anemia due to chronic blood loss, Chronic midline low back pain without sciatica, Centrilobular emphysema ([place]), Tobacco abuse counseling, and Diverticulosis, sigmoid were also pertinent to this visit.  HPI Anna Prince presents for follow up on chronic conditions including back pain controlled with opioids due to CKD and history of NSAID induced ulcer.   Since her last visit her daughter Anna Prince was hospitalized at [facility] with a left brain CVA  requining rehab for right sided weakness.   She is home now and using a walker .  Patient's Anna Prince living with the family and brought her today   Using oxygen only at night..  Still smoking ,  Averages 1/2 pack daily (not inside the house)Spent 3 minutes discussing risk of continued tobacco abuse, including but not limited to in stent thrombosis,  Recurrent MI,  And lung CA. She  is not interested in pharmacotherapy at this time. .    Last seen in May for  Follow up on shingles  episode  ( follow up)  She continues to lose weight despite having a "ok " appetite .  Gradnson cnfirms that she eats at least 3 times daily    Outpatient Medications Prior to Visit  Medication Sig Dispense Refill  . albuterol (PROVENTIL HFA;VENTOLIN HFA) 108 (90 Base) MCG/ACT inhaler Inhale 2 puffs into the lungs every 6 (six) hours as needed for wheezing or shortness of breath. 1 Inhaler 2  . atorvastatin (LIPITOR) 40 MG tablet TAKE 1 TABLET(40 MG) BY MOUTH DAILY AT 6 PM 90 tablet 1  . clopidogrel (PLAVIX) 75 MG tablet TAKE 1 TABLET(75 MG) BY MOUTH DAILY 90 tablet 1  . diazepam (VALIUM) 5 MG tablet Take 1 tablet (5 mg total) by mouth 2 (two) times daily as needed for anxiety. 60 tablet 2  . ferrous sulfate 325 (65 FE) MG  tablet Take 1 tablet (325 mg total) by mouth daily with breakfast. 30 tablet 3  . mirtazapine (REMERON) 45 MG tablet Take 1 tablet (45 mg total) by mouth at bedtime. 90 tablet 1  . nitroGLYCERIN (NITROSTAT) 0.4 MG SL tablet Place 1 tablet (0.4 mg total) under the tongue every 5 (five) minutes x 3 doses as needed for chest pain. 90 tablet 1  . pantoprazole (PROTONIX) 40 MG tablet TAKE 1 TABLET(40 MG) BY MOUTH DAILY 90 tablet 0  . PARoxetine (PAXIL) 30 MG tablet TAKE 1 TABLET BY MOUTH DAILY 30 tablet 5  . budesonide-formoterol (SYMBICORT) 80-4.5 MCG/ACT inhaler Inhale 2 puffs into the lungs 2 (two) times daily. (Patient not taking: Reported on 12/02/2017) 3 Inhaler 1  . megestrol (MEGACE) 40 MG tablet TAKE 1 TABLET(40 MG) BY MOUTH DAILY (Patient not taking: Reported on 12/02/2017) 90 tablet 0  . metoprolol tartrate (LOPRESSOR) 25 MG tablet Take 0.5 tablets (12.5 mg total) by mouth 2 (two) times daily. 60 tablet 0  . valACYclovir (VALTREX) 1000 MG tablet Take 1 tablet (1,000 mg total) by mouth 2 (two) times daily. (Patient not taking: Reported on 12/02/2017) 28 tablet 0  . HYDROcodone-acetaminophen (NORCO/VICODIN) 5-325 MG tablet Take 1 tablet by mouth every 6 (six) hours as needed for moderate pain. Maximum 2 daily. (Patient not taking: Reported on 12/02/2017) 62 tablet 0  . HYDROcodone-acetaminophen (NORCO/VICODIN) 5-325 MG tablet Take 1 tablet  by mouth every 6 (six) hours as needed for moderate pain. Maximum 2 daily. (Patient not taking: Reported on 12/02/2017) 62 tablet 0  . lidocaine (XYLOCAINE) 5 % ointment Apply 1 application topically daily as needed. (Patient not taking: Reported on 12/02/2017) 2500 g 0  . lisinopril (PRINIVIL,ZESTRIL) 20 MG tablet TAKE 1 TABLET BY MOUTH EVERY DAY (Patient not taking: Reported on 12/02/2017) 90 tablet 1  . ondansetron (ZOFRAN) 4 MG tablet Take 1 tablet (4 mg total) by mouth every 8 (eight) hours as needed for nausea or vomiting. (Patient not taking: Reported on  12/02/2017) 21 tablet 0  . pantoprazole (PROTONIX) 40 MG tablet Take 1 tablet (40 mg total) by mouth 2 (two) times daily. (Patient not taking: Reported on 12/02/2017) 60 tablet 2  . SYMBICORT 80-4.5 MCG/ACT inhaler INHALE 2 PUFFS INTO THE LUNGS TWICE DAILY (Patient not taking: Reported on 12/02/2017) 10.2 g 2   No facility-administered medications prior to visit.     Review of Systems;  Patient denies headache, fevers, malaise, unintentional weight loss, skin rash, eye pain, sinus congestion and sinus pain, sore throat, dysphagia,  hemoptysis , cough, dyspnea, wheezing, chest pain, palpitations, orthopnea, edema, abdominal pain, nausea, melena, diarrhea, constipation, flank pain, dysuria, hematuria, urinary  Frequency, nocturia, numbness, tingling, seizures,  Focal weakness, Loss of consciousness,  Tremor, insomnia, depression, anxiety, and suicidal ideation.      Objective:  BP 136/84 (BP Location: Left Arm, Patient Position: Sitting, Cuff Size: Normal)   Pulse 65   Temp 98.1 F (36.7 C) (Oral)   Resp 17   Ht 5\' 5"  (1.651 m)   Wt 119 lb 9.6 oz (54.3 kg)   SpO2 98%   BMI 19.90 kg/m   BP Readings from Last 3 Encounters:  12/02/17 136/84  09/19/17 102/74  08/30/17 (!) 80/58    Wt Readings from Last 3 Encounters:  12/02/17 119 lb 9.6 oz (54.3 kg)  08/30/17 120 lb 12.8 oz (54.8 kg)  08/16/17 123 lb 12.8 oz (56.2 kg)    General appearance: alert, cooperative and appears stated age Ears: normal TM's and external ear canals both ears Throat: lips, mucosa, and tongue normal; teeth and gums normal Neck: no adenopathy, no carotid bruit, supple, symmetrical, trachea midline and thyroid not enlarged, symmetric, no tenderness/mass/nodules Back: symmetric, no curvature. ROM normal. No CVA tenderness. Lungs: clear to auscultation bilaterally Heart: regular rate and rhythm, S1, S2 normal, no murmur, click, rub or gallop Abdomen: soft, non-tender; bowel sounds normal; no masses,  no  organomegaly Pulses: 2+ and symmetric Skin: Skin color, texture, turgor normal. No rashes or lesions Lymph nodes: Cervical, supraclavicular, and axillary nodes normal.  Lab Results  Component Value Date   HGBA1C 5.6 08/29/2016    Lab Results  Component Value Date   CREATININE 1.32 (H) 12/02/2017   CREATININE 1.32 (H) 09/19/2017   CREATININE 1.18 (H) 08/09/2017    Lab Results  Component Value Date   WBC 5.5 12/02/2017   HGB 12.0 12/02/2017   HCT 35.6 (L) 12/02/2017   PLT 174.0 12/02/2017   GLUCOSE 97 12/02/2017   CHOL 142 12/02/2017   TRIG 89.0 12/02/2017   HDL 56.90 12/02/2017   LDLDIRECT 75.0 09/19/2016   LDLCALC 67 12/02/2017   ALT 8 12/02/2017   AST 13 12/02/2017   NA 140 12/02/2017   K 4.4 12/02/2017   CL 105 12/02/2017   CREATININE 1.32 (H) 12/02/2017   BUN 25 (H) 12/02/2017   CO2 28 12/02/2017   TSH 0.44 11/04/2015  INR 1.01 03/28/2017   HGBA1C 5.6 08/29/2016    Ct Renal Stone Study  Result Date: 08/09/2017 CLINICAL DATA:  Right flank pain. EXAM: CT ABDOMEN AND PELVIS WITHOUT CONTRAST TECHNIQUE: Multidetector CT imaging of the abdomen and pelvis was performed following the standard protocol without IV contrast. COMPARISON:  11/05/2016 FINDINGS: Lower chest: Mild bibasilar scarring. Similar moderate-sized sliding hiatal hernia. Hepatobiliary: Unchanged 4.8 cm left hepatic lobe cyst. Punctate stone in the region of the gallbladder neck without gross gallbladder wall thickening or pericholecystic inflammatory change. No biliary dilatation. Pancreas: Unremarkable. Spleen: Unremarkable. Adrenals/Urinary Tract: Unremarkable adrenal glands. Unchanged punctate calcification in the right renal hilum which is likely vascular. No hydronephrosis or definite urinary tract calculi. Unremarkable bladder. Stomach/Bowel: Moderate amount of stool in the colon, particularly in the cecum and ascending colon. Mild fat stranding around the ascending colon is similar to the prior study  without underlying colonic wall thickening. Extensive sigmoid colon diverticulosis is noted with mild fat stranding adjacent to the proximal sigmoid colon (for example series 2, image 43). There is fecalization of some small bowel loops suggestive of slow transit. No bowel dilatation is seen to suggest obstruction. Vascular/Lymphatic: Extensive atherosclerosis of the abdominal aorta and its major branch vessels. Ectatic suprarenal aorta is unchanged with 3.0 cm maximal diameter. No enlarged lymph nodes. Reproductive: Uterus and bilateral adnexa are unremarkable. Other: No intraperitoneal free fluid. Musculoskeletal: Severe lumbar levoscoliosis. Bilateral L5 pars defects with unchanged grade 2 anterolisthesis. Chronic L2 scratch sec chronic L1 compression fracture. IMPRESSION: 1. Mild fat stranding around the ascending colon, similar to the prior study and without significant colonic wall thickening though recurrent mild colitis is a consideration. 2. Extensive sigmoid colon diverticulosis with mild surrounding fat stranding proximally, query mild diverticulitis. 3. No hydronephrosis or definite urinary tract calculi. 4. Unchanged suprarenal abdominal aortic ectasia. 5.  Aortic Atherosclerosis (ICD10-I70.0). Electronically Signed   By: Logan Bores M.D.   On: 08/09/2017 16:02    Assessment & Plan:   Problem List Items Addressed This Visit    Diverticulosis, sigmoid    Her last episode of diverticulitis was treated in ED  im mApril.  She has had no recurrence       Hypertension    Well controlled on current regimen for her age and . Renal function stable, no changes today.  Lab Results  Component Value Date   CREATININE 1.32 (H) 12/02/2017   Lab Results  Component Value Date   NA 140 12/02/2017   K 4.4 12/02/2017   CL 105 12/02/2017   CO2 28 12/02/2017         Relevant Medications   lisinopril (PRINIVIL,ZESTRIL) 20 MG tablet   Other Relevant Orders   Comprehensive metabolic panel  (Completed)   Lumbago    She has chronic pain from osteoporotic fractures and DJD hips.  She is not a candidate for NSAIDs due to history of NSAID induced ulcer and CKD.Marland KitchenRefill history confirmed via [place] Controlled Substance databas, accessed by me today..refill of Vicodin for use bid given for 3 months   Lab Results  Component Value Date   CREATININE 1.32 (H) 12/02/2017         Relevant Medications   HYDROcodone-acetaminophen (NORCO/VICODIN) 5-325 MG tablet   HYDROcodone-acetaminophen (NORCO/VICODIN) 5-325 MG tablet (Start on 01/01/2018)   HYDROcodone-acetaminophen (NORCO/VICODIN) 5-325 MG tablet (Start on 01/31/2018)   HYDROcodone-acetaminophen (NORCO/VICODIN) 5-325 MG tablet (Start on 01/31/2018)   Vitamin D deficiency - Primary    Your vitamin D is low again .  Calling in megadose  to take weekly for 3 months       Relevant Orders   VITAMIN D 25 Hydroxy (Vit-D Deficiency, Fractures) (Completed)   Hyperlipidemia LDL goal <100    LDL and triglycerides are at goal on atorvastatin 40 mg daily .  She has no side effects and liver enzymes are normal. No changes today  Lab Results  Component Value Date   CHOL 142 12/02/2017   HDL 56.90 12/02/2017   LDLCALC 67 12/02/2017   LDLDIRECT 75.0 09/19/2016   TRIG 89.0 12/02/2017   CHOLHDL 2 12/02/2017   Lab Results  Component Value Date   ALT 8 12/02/2017   AST 13 12/02/2017   ALKPHOS 91 12/02/2017   BILITOT 0.5 12/02/2017         Relevant Medications   lisinopril (PRINIVIL,ZESTRIL) 20 MG tablet   Other Relevant Orders   Lipid panel (Completed)   COPD (chronic obstructive pulmonary disease) ([place])    Advance,  With chronic hypoxemia managed with nocturnal supplementl oxygen.  Still smoking , despite multiple interventions and multiple health consequences      Tobacco abuse counseling    Spent 3 minutes discussing risk of continued tobacco abuse, including but not limited to in stent thrombosis,  Recurrent MI,  And lung CA. She   is not interested in pharmacotherapy at this time.      Iron deficiency anemia    Secondary to GI bleed in December.  Referred to Hematology for iron transfusions    Last seen in march  Patient's hemoglobin  Is   now within normal limits.   patient's most recent infusion occurred on May 15, 2017. She has been instructed to continue her oral iron supplementation.   Lab Results  Component Value Date   WBC 5.5 12/02/2017   HGB 12.0 12/02/2017   HCT 35.6 (L) 12/02/2017   MCV 95.1 12/02/2017   PLT 174.0 12/02/2017          Relevant Orders   Iron (Completed)   CBC with Differential/Platelet (Completed)      I have discontinued Hollace R. Stoneham's ondansetron, lidocaine, and SYMBICORT. I have also changed her lisinopril. Additionally, I am having her start on ergocalciferol. Lastly, I am having her maintain her albuterol, metoprolol tartrate, ferrous sulfate, nitroGLYCERIN, budesonide-formoterol, mirtazapine, PARoxetine, megestrol, atorvastatin, clopidogrel, valACYclovir, pantoprazole, diazepam, HYDROcodone-acetaminophen, HYDROcodone-acetaminophen, HYDROcodone-acetaminophen, and HYDROcodone-acetaminophen.  Meds ordered this encounter  Medications  . HYDROcodone-acetaminophen (NORCO/VICODIN) 5-325 MG tablet    Sig: Take 1 tablet by mouth every 6 (six) hours as needed for moderate pain. Maximum 2 daily.    Dispense:  62 tablet    Refill:  0  . HYDROcodone-acetaminophen (NORCO/VICODIN) 5-325 MG tablet    Sig: Take 1 tablet by mouth every 6 (six) hours as needed for moderate pain. Maximum 2 daily.    Dispense:  62 tablet    Refill:  0  . HYDROcodone-acetaminophen (NORCO/VICODIN) 5-325 MG tablet    Sig: Take 1 tablet by mouth every 6 (six) hours as needed for moderate pain. Maximum 2 daily.    Dispense:  62 tablet    Refill:  0  . HYDROcodone-acetaminophen (NORCO/VICODIN) 5-325 MG tablet    Sig: Take 1 tablet by mouth every 6 (six) hours as needed for moderate pain. Maximum 2 daily.     Dispense:  62 tablet    Refill:  0  . lisinopril (PRINIVIL,ZESTRIL) 20 MG tablet    Sig: Take 1 tablet (20 mg total) by mouth daily.  Dispense:  90 tablet    Refill:  1  . ergocalciferol (DRISDOL) 50000 units capsule    Sig: Take 1 capsule (50,000 Units total) by mouth once a week.    Dispense:  12 capsule    Refill:  0    Medications Discontinued During This Encounter  Medication Reason  . lidocaine (XYLOCAINE) 5 % ointment Patient has not taken in last 30 days  . ondansetron (ZOFRAN) 4 MG tablet Patient has not taken in last 30 days  . pantoprazole (PROTONIX) 40 MG tablet Patient has not taken in last 30 days  . SYMBICORT 80-4.5 MCG/ACT inhaler Error  . HYDROcodone-acetaminophen (NORCO/VICODIN) 5-325 MG tablet Reorder  . HYDROcodone-acetaminophen (NORCO/VICODIN) 5-325 MG tablet Reorder  . lisinopril (PRINIVIL,ZESTRIL) 20 MG tablet Reorder    Follow-up: Return in about 3 months (around 03/04/2018) for med refill,  hypertension .   Crecencio Mc, MD

## 2017-12-02 NOTE — Patient Instructions (Addendum)
I have refilled your hydrocodone for 3 more months.  Refills  Were sent to your pharmacy   You need to resume our blood pressure medication , so I have refilled lisinopril 20 mg daily    Fasting labs today

## 2017-12-03 ENCOUNTER — Encounter: Payer: Self-pay | Admitting: Internal Medicine

## 2017-12-03 MED ORDER — ERGOCALCIFEROL 1.25 MG (50000 UT) PO CAPS: 50000.0000 [IU] | ORAL_CAPSULE | ORAL | 0 refills | Status: DC

## 2017-12-03 NOTE — Assessment & Plan Note (Signed)
Well controlled on current regimen for her age and . Renal function stable, no changes today.  Lab Results  Component Value Date   CREATININE 1.32 (H) 12/02/2017   Lab Results  Component Value Date   NA 140 12/02/2017   K 4.4 12/02/2017   CL 105 12/02/2017   CO2 28 12/02/2017

## 2017-12-03 NOTE — Assessment & Plan Note (Signed)
[place],  With chronic hypoxemia managed with nocturnal supplementl oxygen.  Still smoking , despite multiple interventions and multiple health consequences

## 2017-12-03 NOTE — Assessment & Plan Note (Signed)
Spent 3 minutes discussing risk of continued tobacco abuse, including but not limited to in stent thrombosis,  Recurrent MI,  And lung CA. She  is not interested in pharmacotherapy at this time.

## 2017-12-03 NOTE — Assessment & Plan Note (Signed)
Secondary to GI bleed in December.  Referred to Hematology for iron transfusions    Last seen in march  Patient's hemoglobin  Is   now within normal limits.   patient's most recent infusion occurred on May 15, 2017. She has been instructed to continue her oral iron supplementation.   Lab Results  Component Value Date   WBC 5.5 12/02/2017   HGB 12.0 12/02/2017   HCT 35.6 (L) 12/02/2017   MCV 95.1 12/02/2017   PLT 174.0 12/02/2017

## 2017-12-03 NOTE — Assessment & Plan Note (Signed)
LDL and triglycerides are at goal on atorvastatin 40 mg daily .  She has no side effects and liver enzymes are normal. No changes today  Lab Results  Component Value Date   CHOL 142 12/02/2017   HDL 56.90 12/02/2017   LDLCALC 67 12/02/2017   LDLDIRECT 75.0 09/19/2016   TRIG 89.0 12/02/2017   CHOLHDL 2 12/02/2017   Lab Results  Component Value Date   ALT 8 12/02/2017   AST 13 12/02/2017   ALKPHOS 91 12/02/2017   BILITOT 0.5 12/02/2017

## 2017-12-03 NOTE — Assessment & Plan Note (Signed)
Her last episode of diverticulitis was treated in ED  im mApril.  She has had no recurrence

## 2017-12-03 NOTE — Assessment & Plan Note (Signed)
She has chronic pain from osteoporotic fractures and DJD hips.  She is not a candidate for NSAIDs due to history of NSAID induced ulcer and CKD.Marland KitchenRefill history confirmed via [place] Controlled Substance databas, accessed by me today..refill of Vicodin for use bid given for 3 months   Lab Results  Component Value Date   CREATININE 1.32 (H) 12/02/2017

## 2017-12-03 NOTE — Assessment & Plan Note (Signed)
Your vitamin D is low again .  Calling in megadose  to take weekly for 3 months

## 2017-12-20 ENCOUNTER — Other Ambulatory Visit: Payer: Self-pay | Admitting: Internal Medicine

## 2017-12-22 DIAGNOSIS — J9611 Chronic respiratory failure with hypoxia: Secondary | ICD-10-CM | POA: Diagnosis not present

## 2017-12-22 DIAGNOSIS — J449 Chronic obstructive pulmonary disease, unspecified: Secondary | ICD-10-CM | POA: Diagnosis not present

## 2018-01-05 ENCOUNTER — Other Ambulatory Visit: Payer: Self-pay | Admitting: Adult Health

## 2018-01-07 ENCOUNTER — Other Ambulatory Visit: Payer: Self-pay | Admitting: Internal Medicine

## 2018-01-14 ENCOUNTER — Other Ambulatory Visit: Payer: Self-pay | Admitting: Internal Medicine

## 2018-01-21 ENCOUNTER — Other Ambulatory Visit: Payer: Self-pay | Admitting: *Deleted

## 2018-01-21 DIAGNOSIS — J449 Chronic obstructive pulmonary disease, unspecified: Secondary | ICD-10-CM | POA: Diagnosis not present

## 2018-01-21 DIAGNOSIS — J9611 Chronic respiratory failure with hypoxia: Secondary | ICD-10-CM | POA: Diagnosis not present

## 2018-01-21 MED ORDER — DIAZEPAM 5 MG PO TABS: 5.0000 mg | ORAL_TABLET | Freq: Two times a day (BID) | ORAL | 2 refills | Status: DC | PRN

## 2018-01-21 NOTE — Telephone Encounter (Signed)
Copied from [place] [phone]. Topic: Quick Communication - Rx Refill/Question >> Jan 21, 2018 12:07 PM Carolyn Stare wrote: Medication diazepam (VALIUM) 5 MG tablet  Has the patient contacted their pharmacy yes    Preferred Pharmacy  [facility]   Agent: Please be advised that RX refills may take up to 3 business days. We ask that you follow-up with your pharmacy.

## 2018-01-21 NOTE — Telephone Encounter (Signed)
Refilled: 10/23/2017 Last OV: 12/02/2017 Next OV: not scheduled

## 2018-01-21 NOTE — Telephone Encounter (Signed)
Last filled 10/23/17 for 60 with  2 refills last OV 12/02/17 ok to fill? Valium 5 mg

## 2018-01-21 NOTE — Telephone Encounter (Signed)
Diazepam has been refilled

## 2018-01-22 ENCOUNTER — Other Ambulatory Visit: Payer: Self-pay | Admitting: Internal Medicine

## 2018-02-21 DIAGNOSIS — J9611 Chronic respiratory failure with hypoxia: Secondary | ICD-10-CM | POA: Diagnosis not present

## 2018-02-21 DIAGNOSIS — J449 Chronic obstructive pulmonary disease, unspecified: Secondary | ICD-10-CM | POA: Diagnosis not present

## 2018-02-25 ENCOUNTER — Other Ambulatory Visit: Payer: Self-pay | Admitting: Internal Medicine

## 2018-03-07 ENCOUNTER — Other Ambulatory Visit: Payer: Self-pay

## 2018-03-07 ENCOUNTER — Telehealth: Payer: Self-pay | Admitting: Internal Medicine

## 2018-03-07 NOTE — Telephone Encounter (Signed)
Reviewed chart re: plan for Lisinopril, as was advised that pt. Understood she is not to continue taking this medication.  Noted office note of 08/30/17, that Lisinopril was discontinued due to hypotension.  Then in reviewing office note of 12/02/17, it was reordered.  Will send note to Dr. Derrel Nip to confirm that pt. Is to resume Lisinopril.      Message from [facility] sent at 03/07/2018 11:24 AM EST   Anna with [facility] called in for medication adherence. Vicente Males stated lisinopril (PRINIVIL,ZESTRIL) 20 MG tablet is on the patient list of medications but the patient states she was advised not to take the medication. Vicente Males requests that patient be contacted to advise whether or not she should still be taking the lisinopril (PRINIVIL,ZESTRIL) 20 MG tablet.

## 2018-03-07 NOTE — Patient Outreach (Signed)
[place] [facility]) Care Management  03/07/2018  [name] Jun 23, 1931 [id number]   Medication Adherence call to Anna Prince spoke with patient she explain she thought doctor told her to stop this medication Lisinopril 20 mg but, pharmacy fill the prescription and she has pick up, patient is not sure if she is to be taking this medication, left a message at doctor's office and they will check with her doctor and will call the patient to let her know.[name] is showing past due under [employer].   [facility] Direct Dial [phone]  Fax [phone] [name]

## 2018-03-07 NOTE — Telephone Encounter (Signed)
Notified patient per PCP note from 12/02/17 she is to take lisinopril 20 mg daily. Patient voiced understanding to instructions.

## 2018-03-07 NOTE — Telephone Encounter (Signed)
This encounter was created in error - please disregard.

## 2018-03-23 ENCOUNTER — Other Ambulatory Visit: Payer: Self-pay | Admitting: Internal Medicine

## 2018-03-23 DIAGNOSIS — J9611 Chronic respiratory failure with hypoxia: Secondary | ICD-10-CM | POA: Diagnosis not present

## 2018-03-23 DIAGNOSIS — J449 Chronic obstructive pulmonary disease, unspecified: Secondary | ICD-10-CM | POA: Diagnosis not present

## 2018-04-08 ENCOUNTER — Other Ambulatory Visit: Payer: Self-pay | Admitting: Internal Medicine

## 2018-04-18 ENCOUNTER — Other Ambulatory Visit: Payer: Self-pay | Admitting: Internal Medicine

## 2018-04-22 ENCOUNTER — Other Ambulatory Visit: Payer: Self-pay | Admitting: Internal Medicine

## 2018-04-23 DIAGNOSIS — J9611 Chronic respiratory failure with hypoxia: Secondary | ICD-10-CM | POA: Diagnosis not present

## 2018-04-23 DIAGNOSIS — J449 Chronic obstructive pulmonary disease, unspecified: Secondary | ICD-10-CM | POA: Diagnosis not present

## 2018-05-02 ENCOUNTER — Encounter: Payer: Self-pay | Admitting: Internal Medicine

## 2018-05-02 ENCOUNTER — Ambulatory Visit (INDEPENDENT_AMBULATORY_CARE_PROVIDER_SITE_OTHER): Payer: Medicare Other | Admitting: Internal Medicine

## 2018-05-02 VITALS — BP 78/54 | HR 78 | Resp 14 | Wt 114.8 lb

## 2018-05-02 DIAGNOSIS — J9611 Chronic respiratory failure with hypoxia: Secondary | ICD-10-CM

## 2018-05-02 DIAGNOSIS — Z23 Encounter for immunization: Secondary | ICD-10-CM | POA: Diagnosis not present

## 2018-05-02 DIAGNOSIS — I10 Essential (primary) hypertension: Secondary | ICD-10-CM

## 2018-05-02 DIAGNOSIS — M545 Low back pain, unspecified: Secondary | ICD-10-CM

## 2018-05-02 DIAGNOSIS — F419 Anxiety disorder, unspecified: Secondary | ICD-10-CM

## 2018-05-02 DIAGNOSIS — M6281 Muscle weakness (generalized): Secondary | ICD-10-CM | POA: Diagnosis not present

## 2018-05-02 DIAGNOSIS — E785 Hyperlipidemia, unspecified: Secondary | ICD-10-CM

## 2018-05-02 DIAGNOSIS — G8929 Other chronic pain: Secondary | ICD-10-CM

## 2018-05-02 DIAGNOSIS — R634 Abnormal weight loss: Secondary | ICD-10-CM

## 2018-05-02 MED ORDER — HYDROCODONE-ACETAMINOPHEN 5-325 MG PO TABS: 1.0000 | ORAL_TABLET | Freq: Four times a day (QID) | ORAL | 0 refills | Status: DC | PRN

## 2018-05-02 MED ORDER — ERGOCALCIFEROL 1.25 MG (50000 UT) PO CAPS: 50000.0000 [IU] | ORAL_CAPSULE | ORAL | 0 refills | Status: DC

## 2018-05-02 MED ORDER — DIAZEPAM 5 MG PO TABS: 5.0000 mg | ORAL_TABLET | Freq: Two times a day (BID) | ORAL | 5 refills | Status: DC | PRN

## 2018-05-02 MED ORDER — PANTOPRAZOLE SODIUM 40 MG PO TBEC: DELAYED_RELEASE_TABLET | ORAL | 2 refills | Status: DC

## 2018-05-02 NOTE — Progress Notes (Signed)
Subjective:  Patient ID: Anna Prince, female    DOB: [birth year]  Age: 85 y.o. MRN: [id number]  CC: The primary encounter diagnosis was Essential hypertension. Diagnoses of Encounter for immunization, Chronic respiratory failure with hypoxia ([place]), Generalized muscle weakness, Chronic midline low back pain without sciatica, Unintentional weight loss, Hyperlipidemia LDL goal <100, and Anxiety were also pertinent to this visit.  HPI Anna Prince presents for follow  Up on chronic conditions including chronic pain , managed with vicodin , hypertension,  COPD and ongoing tobacco abuse. She is accompanied by her sister.   Patient is seated in wheelchair.  She does very little walking,   due to progressive generalized weakness .  She uses a walker at home but spends most her day in the recliner.    Today she reports feeling weak  For the past 2 days,  But notes that the symptoms really began 2 weeks ago,  around Christmas.   She denies nausea and vomiting but has been having chronic intermittent diarrhea alternating with normal stools, feels the loose stools are brought on by eating certain foods.    Appetite   has been poor since her daughter Anna Prince was hospitalized.  She is noted to be hypotensive today BP 78 systolic,  But she does not appear acutely ill.   Diet reviewed:  Breakfast is  Pop tarts, lunch is deli  meat sandwhich or PBJ ,  And whatever her daughter prepares for dinner,  Usually a hot meal .   Has run out of Vicodin and diazepam .  Refill history confirmed via [place] Controlled Substance databas, accessed by me today..  Still smoking   Daughter Anna Prince has had a CVA and CABG.  Other daughter keeps house for her.  3 generations in a 3 br house. Including 3 children under the age of 20 (one toddler)    Feels stressed by Anna Prince's health  struggle   Outpatient Medications Prior to Visit  Medication Sig Dispense Refill  . albuterol (PROVENTIL HFA;VENTOLIN HFA) 108 (90 Base) MCG/ACT inhaler  Inhale 2 puffs into the lungs every 6 (six) hours as needed for wheezing or shortness of breath. 1 Inhaler 2  . atorvastatin (LIPITOR) 40 MG tablet TAKE 1 TABLET(40 MG) BY MOUTH DAILY AT 6 PM 90 tablet 1  . clopidogrel (PLAVIX) 75 MG tablet TAKE 1 TABLET(75 MG) BY MOUTH DAILY 90 tablet 1  . mirtazapine (REMERON) 45 MG tablet TAKE 1 TABLET(45 MG) BY MOUTH AT BEDTIME 90 tablet 1  . nitroGLYCERIN (NITROSTAT) 0.4 MG SL tablet Place 1 tablet (0.4 mg total) under the tongue every 5 (five) minutes x 3 doses as needed for chest pain. 90 tablet 1  . SYMBICORT 80-4.5 MCG/ACT inhaler INHALE 2 PUFFS INTO THE LUNGS TWICE DAILY 10.2 g 0  . valACYclovir (VALTREX) 1000 MG tablet Take 1 tablet (1,000 mg total) by mouth 2 (two) times daily. 28 tablet 0  . diazepam (VALIUM) 5 MG tablet Take 1 tablet (5 mg total) by mouth 2 (two) times daily as needed for anxiety. 60 tablet 2  . ergocalciferol (DRISDOL) 50000 units capsule Take 1 capsule (50,000 Units total) by mouth once a week. 12 capsule 0  . lisinopril (PRINIVIL,ZESTRIL) 20 MG tablet TAKE 1 TABLET BY MOUTH EVERY DAY 90 tablet 0  . pantoprazole (PROTONIX) 40 MG tablet TAKE 1 TABLET(40 MG) BY MOUTH DAILY 90 tablet 0  . ferrous sulfate 325 (65 FE) MG tablet Take 1 tablet (325 mg total) by mouth daily  with breakfast. (Patient not taking: Reported on 05/02/2018) 30 tablet 3  . HYDROcodone-acetaminophen (NORCO/VICODIN) 5-325 MG tablet Take 1 tablet by mouth every 6 (six) hours as needed for moderate pain. Maximum 2 daily. (Patient not taking: Reported on 05/02/2018) 62 tablet 0  . HYDROcodone-acetaminophen (NORCO/VICODIN) 5-325 MG tablet Take 1 tablet by mouth every 6 (six) hours as needed for moderate pain. Maximum 2 daily. (Patient not taking: Reported on 05/02/2018) 62 tablet 0  . HYDROcodone-acetaminophen (NORCO/VICODIN) 5-325 MG tablet Take 1 tablet by mouth every 6 (six) hours as needed for moderate pain. Maximum 2 daily. (Patient not taking: Reported on 05/02/2018) 62  tablet 0  . megestrol (MEGACE) 40 MG tablet TAKE 1 TABLET(40 MG) BY MOUTH DAILY (Patient not taking: Reported on 05/02/2018) 90 tablet 0  . PARoxetine (PAXIL) 30 MG tablet TAKE 1 TABLET BY MOUTH DAILY (Patient not taking: Reported on 05/02/2018) 30 tablet 5  . PARoxetine (PAXIL) 30 MG tablet TAKE 1 TABLET BY MOUTH DAILY (Patient not taking: Reported on 05/02/2018) 90 tablet 1  . HYDROcodone-acetaminophen (NORCO/VICODIN) 5-325 MG tablet Take 1 tablet by mouth every 6 (six) hours as needed for moderate pain. Maximum 2 daily. (Patient not taking: Reported on 05/02/2018) 62 tablet 0  . metoprolol tartrate (LOPRESSOR) 25 MG tablet Take 0.5 tablets (12.5 mg total) by mouth 2 (two) times daily. 60 tablet 0   No facility-administered medications prior to visit.     Review of Systems;  Patient denies headache, fevers, malaise, unintentional weight loss, skin rash, eye pain, sinus congestion and sinus pain, sore throat, dysphagia,  hemoptysis , cough, dyspnea, wheezing, chest pain, palpitations, orthopnea, edema, abdominal pain, nausea, melena, diarrhea, constipation, flank pain, dysuria, hematuria, urinary  Frequency, nocturia, numbness, tingling, seizures,  Focal weakness, Loss of consciousness,  Tremor, insomnia, depression, anxiety, and suicidal ideation.      Objective:  BP (!) 78/54 (BP Location: Left Arm, Patient Position: Sitting, Cuff Size: Normal)   Pulse 78   Resp 14   Wt 114 lb 12.8 oz (52.1 kg)   SpO2 95%   BMI 19.10 kg/m   BP Readings from Last 3 Encounters:  05/02/18 (!) 78/54  12/02/17 136/84  09/19/17 102/74    Wt Readings from Last 3 Encounters:  05/02/18 114 lb 12.8 oz (52.1 kg)  12/02/17 119 lb 9.6 oz (54.3 kg)  08/30/17 120 lb 12.8 oz (54.8 kg)    General appearance: alert, cooperative and appears stated age Ears: normal TM's and external ear canals both ears Throat: lips, mucosa, and tongue normal; teeth and gums normal Neck: no adenopathy, no carotid bruit, supple,  symmetrical, trachea midline and thyroid not enlarged, symmetric, no tenderness/mass/nodules Back: symmetric, no curvature. ROM normal. No CVA tenderness. Lungs: clear to auscultation bilaterally Heart: regular rate and rhythm, S1, S2 normal, no murmur, click, rub or gallop Abdomen: soft, non-tender; bowel sounds normal; no masses,  no organomegaly Pulses: 2+ and symmetric Skin: Skin color, texture, turgor normal. No rashes or lesions Lymph nodes: Cervical, supraclavicular, and axillary nodes normal.  Lab Results  Component Value Date   HGBA1C 5.6 08/29/2016    Lab Results  Component Value Date   CREATININE 1.60 (H) 05/02/2018   CREATININE 1.32 (H) 12/02/2017   CREATININE 1.32 (H) 09/19/2017    Lab Results  Component Value Date   WBC 5.5 12/02/2017   HGB 12.0 12/02/2017   HCT 35.6 (L) 12/02/2017   PLT 174.0 12/02/2017   GLUCOSE 105 (H) 05/02/2018   CHOL 142 12/02/2017  TRIG 89.0 12/02/2017   HDL 56.90 12/02/2017   LDLDIRECT 75.0 09/19/2016   LDLCALC 67 12/02/2017   ALT 7 05/02/2018   AST 13 05/02/2018   NA 143 05/02/2018   K 4.7 05/02/2018   CL 107 05/02/2018   CREATININE 1.60 (H) 05/02/2018   BUN 26 (H) 05/02/2018   CO2 26 05/02/2018   TSH 0.44 11/04/2015   INR 1.01 03/28/2017   HGBA1C 5.6 08/29/2016    Ct Renal Stone Study  Result Date: 08/09/2017 CLINICAL DATA:  Right flank pain. EXAM: CT ABDOMEN AND PELVIS WITHOUT CONTRAST TECHNIQUE: Multidetector CT imaging of the abdomen and pelvis was performed following the standard protocol without IV contrast. COMPARISON:  11/05/2016 FINDINGS: Lower chest: Mild bibasilar scarring. Similar moderate-sized sliding hiatal hernia. Hepatobiliary: Unchanged 4.8 cm left hepatic lobe cyst. Punctate stone in the region of the gallbladder neck without gross gallbladder wall thickening or pericholecystic inflammatory change. No biliary dilatation. Pancreas: Unremarkable. Spleen: Unremarkable. Adrenals/Urinary Tract: Unremarkable adrenal  glands. Unchanged punctate calcification in the right renal hilum which is likely vascular. No hydronephrosis or definite urinary tract calculi. Unremarkable bladder. Stomach/Bowel: Moderate amount of stool in the colon, particularly in the cecum and ascending colon. Mild fat stranding around the ascending colon is similar to the prior study without underlying colonic wall thickening. Extensive sigmoid colon diverticulosis is noted with mild fat stranding adjacent to the proximal sigmoid colon (for example series 2, image 43). There is fecalization of some small bowel loops suggestive of slow transit. No bowel dilatation is seen to suggest obstruction. Vascular/Lymphatic: Extensive atherosclerosis of the abdominal aorta and its major branch vessels. Ectatic suprarenal aorta is unchanged with 3.0 cm maximal diameter. No enlarged lymph nodes. Reproductive: Uterus and bilateral adnexa are unremarkable. Other: No intraperitoneal free fluid. Musculoskeletal: Severe lumbar levoscoliosis. Bilateral L5 pars defects with unchanged grade 2 anterolisthesis. Chronic L2 scratch sec chronic L1 compression fracture. IMPRESSION: 1. Mild fat stranding around the ascending colon, similar to the prior study and without significant colonic wall thickening though recurrent mild colitis is a consideration. 2. Extensive sigmoid colon diverticulosis with mild surrounding fat stranding proximally, query mild diverticulitis. 3. No hydronephrosis or definite urinary tract calculi. 4. Unchanged suprarenal abdominal aortic ectasia. 5.  Aortic Atherosclerosis (ICD10-I70.0). Electronically Signed   By: Logan Bores M.D.   On: 08/09/2017 16:02    Assessment & Plan:   Problem List Items Addressed This Visit    Anxiety    Aggravated by daughter's serious health problems and her own end stage COPD.  Continue temazepam for anxiety , valium for daytime use . The risks and benefits of benzodiazepine use were discussed with patient today including  excessive sedation leading to respiratory depression,  impaired thinking/driving, and addiction.  Patient was advised to avoid concurrent use with alcohol, to use medication only as needed and not to share with others  .       Relevant Medications   diazepam (VALIUM) 5 MG tablet   Chronic respiratory failure with hypoxia ([place])    Secondary to late stage COPd with ongoing tobacco abuse.  She is aware of the risks of smoking while using supplemental oxygen.  She is not tachypneic or hypoxic  today       Generalized muscle weakness    Secondary to deconditioning and inactivity due to chronic pain .  She does not want PT> She is reminded to try to walk for a few minutes daily with her walker  She has appropriate supervision and assistance at  home by family members.       Hyperlipidemia LDL goal <100    LDL and triglycerides are at goal on atorvastatin 40 mg daily .  She has no side effects and liver enzymes are normal. No changes today  Lab Results  Component Value Date   CHOL 142 12/02/2017   HDL 56.90 12/02/2017   LDLCALC 67 12/02/2017   LDLDIRECT 75.0 09/19/2016   TRIG 89.0 12/02/2017   CHOLHDL 2 12/02/2017   Lab Results  Component Value Date   ALT 7 05/02/2018   AST 13 05/02/2018   ALKPHOS 91 12/02/2017   BILITOT 0.3 05/02/2018         Hypertension - Primary    Stopping her lisinopril today.       Relevant Orders   Comprehensive metabolic panel (Completed)   Lumbago    She has chronic pain from osteoporotic fractures and DJD hips.  She is not a candidate for NSAIDs due to history of NSAID induced ulcer and CKD.Marland KitchenRefill history confirmed via [place] Controlled Substance databas, accessed by me today..refill of Vicodin for use bid to be given for 3 months   Lab Results  Component Value Date   CREATININE 1.60 (H) 05/02/2018         Relevant Medications   HYDROcodone-acetaminophen (NORCO/VICODIN) 5-325 MG tablet   Unintentional weight loss     I have reviewed her diet and  recommended that she increase her protein and fat intake using Ensure Max Protein,  Samples given.  And referring for meals on Wheels        Other Visit Diagnoses    Encounter for immunization       Relevant Orders   Flu vaccine HIGH DOSE PF (Completed)      I have discontinued Latavia R. Ardolino's metoprolol tartrate and lisinopril. I have also changed her ergocalciferol. Additionally, I am having her maintain her albuterol, ferrous sulfate, nitroGLYCERIN, PARoxetine, megestrol, valACYclovir, HYDROcodone-acetaminophen, HYDROcodone-acetaminophen, HYDROcodone-acetaminophen, atorvastatin, PARoxetine, clopidogrel, SYMBICORT, mirtazapine, HYDROcodone-acetaminophen, diazepam, and pantoprazole.  Meds ordered this encounter  Medications  . ergocalciferol (DRISDOL) 1.25 MG (50000 UT) capsule    Sig: Take 1 capsule (50,000 Units total) by mouth once a week.    Dispense:  12 capsule    Refill:  0  . HYDROcodone-acetaminophen (NORCO/VICODIN) 5-325 MG tablet    Sig: Take 1 tablet by mouth every 6 (six) hours as needed for moderate pain. Maximum 2 daily.    Dispense:  60 tablet    Refill:  0  . diazepam (VALIUM) 5 MG tablet    Sig: Take 1 tablet (5 mg total) by mouth 2 (two) times daily as needed for anxiety.    Dispense:  60 tablet    Refill:  5  . pantoprazole (PROTONIX) 40 MG tablet    Sig: TAKE 1 TABLET(40 MG) BY MOUTH DAILY    Dispense:  90 tablet    Refill:  2    Medications Discontinued During This Encounter  Medication Reason  . metoprolol tartrate (LOPRESSOR) 25 MG tablet   . lisinopril (PRINIVIL,ZESTRIL) 20 MG tablet   . ergocalciferol (DRISDOL) 50000 units capsule Reorder  . HYDROcodone-acetaminophen (NORCO/VICODIN) 5-325 MG tablet Reorder  . diazepam (VALIUM) 5 MG tablet Reorder  . pantoprazole (PROTONIX) 40 MG tablet Reorder    Follow-up: Return in about 3 months (around 08/01/2018).   Crecencio Mc, MD

## 2018-05-02 NOTE — Patient Instructions (Addendum)
I have refilled the diazepam for 6 months  I will refill the hydrocodone for 3 months before you will need to be seen again    Suspend the lisinopril because your blood pressure is TOO LOW    You need to drink a protein drink daily for nutrition :  Ensure Max Protein   Premier Protein  or Muscle Milk.    I think Meals on Wheels would be a good idea.    You received your influenza vaccine today

## 2018-05-03 LAB — COMPREHENSIVE METABOLIC PANEL
AG Ratio: 1.4 (calc) (ref 1.0–2.5)
ALT: 7 U/L (ref 6–29)
AST: 13 U/L (ref 10–35)
Albumin: 3.6 g/dL (ref 3.6–5.1)
Alkaline phosphatase (APISO): 78 U/L (ref 33–130)
BUN/Creatinine Ratio: 16 (calc) (ref 6–22)
BUN: 26 mg/dL — ABNORMAL HIGH (ref 7–25)
CO2: 26 mmol/L (ref 20–32)
CREATININE: 1.6 mg/dL — AB (ref 0.60–0.88)
Calcium: 9 mg/dL (ref 8.6–10.4)
Chloride: 107 mmol/L (ref 98–110)
Globulin: 2.6 g/dL (calc) (ref 1.9–3.7)
Glucose, Bld: 105 mg/dL — ABNORMAL HIGH (ref 65–99)
Potassium: 4.7 mmol/L (ref 3.5–5.3)
Sodium: 143 mmol/L (ref 135–146)
Total Bilirubin: 0.3 mg/dL (ref 0.2–1.2)
Total Protein: 6.2 g/dL (ref 6.1–8.1)

## 2018-05-04 ENCOUNTER — Encounter: Payer: Self-pay | Admitting: Internal Medicine

## 2018-05-04 NOTE — Assessment & Plan Note (Addendum)
Aggravated by daughter's serious health problems and her own end stage COPD.  Continue temazepam for anxiety , valium for daytime use . The risks and benefits of benzodiazepine use were discussed with patient today including excessive sedation leading to respiratory depression,  impaired thinking/driving, and addiction.  Patient was advised to avoid concurrent use with alcohol, to use medication only as needed and not to share with others  .  

## 2018-05-04 NOTE — Assessment & Plan Note (Signed)
LDL and triglycerides are at goal on atorvastatin 40 mg daily .  She has no side effects and liver enzymes are normal. No changes today  Lab Results  Component Value Date   CHOL 142 12/02/2017   HDL 56.90 12/02/2017   LDLCALC 67 12/02/2017   LDLDIRECT 75.0 09/19/2016   TRIG 89.0 12/02/2017   CHOLHDL 2 12/02/2017   Lab Results  Component Value Date   ALT 7 05/02/2018   AST 13 05/02/2018   ALKPHOS 91 12/02/2017   BILITOT 0.3 05/02/2018

## 2018-05-04 NOTE — Assessment & Plan Note (Signed)
She has chronic pain from osteoporotic fractures and DJD hips.  She is not a candidate for NSAIDs due to history of NSAID induced ulcer and CKD.Marland KitchenRefill history confirmed via [place] Controlled Substance databas, accessed by me today..refill of Vicodin for use bid to be given for 3 months   Lab Results  Component Value Date   CREATININE 1.60 (H) 05/02/2018

## 2018-05-04 NOTE — Assessment & Plan Note (Signed)
Secondary to late stage COPd with ongoing tobacco abuse.  She is aware of the risks of smoking while using supplemental oxygen.  She is not tachypneic or hypoxic  today  

## 2018-05-04 NOTE — Assessment & Plan Note (Signed)
I have reviewed her diet and recommended that she increase her protein and fat intake using Ensure Max Protein,  Samples given.  And referring for meals on Wheels

## 2018-05-04 NOTE — Assessment & Plan Note (Signed)
Stopping her lisinopril today.

## 2018-05-04 NOTE — Assessment & Plan Note (Signed)
Secondary to deconditioning and inactivity due to chronic pain .  She does not want PT> She is reminded to try to walk for a few minutes daily with her walker  She has appropriate supervision and assistance at home by family members.

## 2018-05-07 NOTE — Progress Notes (Signed)
Referral has been made to Meals on wheels. If her daughters arwe still living in the home she may not qualify.

## 2018-05-23 ENCOUNTER — Other Ambulatory Visit: Payer: Self-pay

## 2018-05-23 MED ORDER — BUDESONIDE-FORMOTEROL FUMARATE 80-4.5 MCG/ACT IN AERO: 2.0000 | INHALATION_SPRAY | Freq: Two times a day (BID) | RESPIRATORY_TRACT | 0 refills | Status: DC

## 2018-05-24 DIAGNOSIS — J449 Chronic obstructive pulmonary disease, unspecified: Secondary | ICD-10-CM | POA: Diagnosis not present

## 2018-05-24 DIAGNOSIS — J9611 Chronic respiratory failure with hypoxia: Secondary | ICD-10-CM | POA: Diagnosis not present

## 2018-06-06 ENCOUNTER — Emergency Department: Payer: Medicare Other

## 2018-06-06 ENCOUNTER — Emergency Department
Admission: EM | Admit: 2018-06-06 | Discharge: 2018-06-07 | Disposition: A | Discharge status: Home / Self Care | Payer: Medicare Other | Attending: Emergency Medicine | Admitting: Emergency Medicine

## 2018-06-06 ENCOUNTER — Other Ambulatory Visit: Payer: Self-pay

## 2018-06-06 ENCOUNTER — Encounter: Payer: Self-pay | Admitting: Emergency Medicine

## 2018-06-06 DIAGNOSIS — S8002XA Contusion of left knee, initial encounter: Secondary | ICD-10-CM | POA: Insufficient documentation

## 2018-06-06 DIAGNOSIS — W19XXXA Unspecified fall, initial encounter: Secondary | ICD-10-CM

## 2018-06-06 DIAGNOSIS — F17210 Nicotine dependence, cigarettes, uncomplicated: Secondary | ICD-10-CM | POA: Insufficient documentation

## 2018-06-06 DIAGNOSIS — I252 Old myocardial infarction: Secondary | ICD-10-CM | POA: Insufficient documentation

## 2018-06-06 DIAGNOSIS — Y999 Unspecified external cause status: Secondary | ICD-10-CM | POA: Diagnosis not present

## 2018-06-06 DIAGNOSIS — S299XXA Unspecified injury of thorax, initial encounter: Secondary | ICD-10-CM | POA: Diagnosis not present

## 2018-06-06 DIAGNOSIS — Z7902 Long term (current) use of antithrombotics/antiplatelets: Secondary | ICD-10-CM | POA: Insufficient documentation

## 2018-06-06 DIAGNOSIS — W0110XA Fall on same level from slipping, tripping and stumbling with subsequent striking against unspecified object, initial encounter: Secondary | ICD-10-CM | POA: Diagnosis not present

## 2018-06-06 DIAGNOSIS — I2510 Atherosclerotic heart disease of native coronary artery without angina pectoris: Secondary | ICD-10-CM | POA: Insufficient documentation

## 2018-06-06 DIAGNOSIS — Y92009 Unspecified place in unspecified non-institutional (private) residence as the place of occurrence of the external cause: Secondary | ICD-10-CM | POA: Insufficient documentation

## 2018-06-06 DIAGNOSIS — Y9301 Activity, walking, marching and hiking: Secondary | ICD-10-CM | POA: Diagnosis not present

## 2018-06-06 DIAGNOSIS — I10 Essential (primary) hypertension: Secondary | ICD-10-CM | POA: Diagnosis not present

## 2018-06-06 DIAGNOSIS — S60222A Contusion of left hand, initial encounter: Secondary | ICD-10-CM | POA: Insufficient documentation

## 2018-06-06 DIAGNOSIS — S63202A Unspecified subluxation of right middle finger, initial encounter: Secondary | ICD-10-CM | POA: Diagnosis not present

## 2018-06-06 DIAGNOSIS — Z79899 Other long term (current) drug therapy: Secondary | ICD-10-CM | POA: Diagnosis not present

## 2018-06-06 DIAGNOSIS — S8992XA Unspecified injury of left lower leg, initial encounter: Secondary | ICD-10-CM | POA: Diagnosis not present

## 2018-06-06 DIAGNOSIS — S63213A Subluxation of metacarpophalangeal joint of left middle finger, initial encounter: Secondary | ICD-10-CM | POA: Diagnosis not present

## 2018-06-06 DIAGNOSIS — J449 Chronic obstructive pulmonary disease, unspecified: Secondary | ICD-10-CM | POA: Diagnosis not present

## 2018-06-06 LAB — BASIC METABOLIC PANEL
Anion gap: 8 (ref 5–15)
BUN: 27 mg/dL — ABNORMAL HIGH (ref 8–23)
CO2: 25 mmol/L (ref 22–32)
Calcium: 9.1 mg/dL (ref 8.9–10.3)
Chloride: 105 mmol/L (ref 98–111)
Creatinine, Ser: 1.17 mg/dL — ABNORMAL HIGH (ref 0.44–1.00)
GFR calc Af Amer: 50 mL/min — ABNORMAL LOW (ref >60)
GFR calc non Af Amer: 43 mL/min — ABNORMAL LOW (ref >60)
Glucose, Bld: 98 mg/dL (ref 70–99)
POTASSIUM: 4.4 mmol/L (ref 3.5–5.1)
Sodium: 138 mmol/L (ref 135–145)

## 2018-06-06 LAB — CBC
HCT: 32.7 % — ABNORMAL LOW (ref 36.0–46.0)
Hemoglobin: 10.4 g/dL — ABNORMAL LOW (ref 12.0–15.0)
MCH: 30.4 pg (ref 26.0–34.0)
MCHC: 31.8 g/dL (ref 30.0–36.0)
MCV: 95.6 fL (ref 80.0–100.0)
Platelets: 191 10*3/uL (ref 150–400)
RBC: 3.42 MIL/uL — AB (ref 3.87–5.11)
RDW: 13.1 % (ref 11.5–15.5)
WBC: 9.1 10*3/uL (ref 4.0–10.5)
nRBC: 0 % (ref 0.0–0.2)

## 2018-06-06 MED ORDER — HYDROCODONE-ACETAMINOPHEN 5-325 MG PO TABS
1.0000 | ORAL_TABLET | ORAL | Status: AC
  Administered 2018-06-06: 1 via ORAL
  Filled 2018-06-06: qty 1

## 2018-06-06 NOTE — ED Notes (Signed)
Pt aware that urine sample is needed.  

## 2018-06-06 NOTE — ED Notes (Signed)
ED Provider at bedside. 

## 2018-06-06 NOTE — ED Notes (Signed)
Pt ambulatory with walker in room. Pt is in NAD.

## 2018-06-06 NOTE — ED Triage Notes (Signed)
Pt arrives POV to triage with c/o falling x 2 tonight. Pt denies LOC or hitting her head but reports that she hit her left shoulder, left ribs, left knee, and her left hand. Pt states that she is on a blood thinner and her hx states Plavix which was verified by her daughters. Pt is in NAD.

## 2018-06-06 NOTE — ED Provider Notes (Signed)
[facility] Emergency Department Provider Note   ____________________________________________   First MD Initiated Contact with Patient 06/06/18 2128     (approximate)  I have reviewed the triage vital signs and the nursing notes.   HISTORY  Chief Complaint Fall    HPI MASHELLE BUSICK is a 85 y.o. female here for evaluation for a fall.  Patient reports she tripped over the laundry earlier today, fell onto her left knee, also sore over her left hand including area of her pinky finger.  Reports she was doing okay, sore over the left knee but then decided to come seek medical care as she was walking down her steps the left knee caused pain causing her to fall forward again.  None she fell striking her chest and feels a little bit sore over that area as well.  She does use oxygen at home, but not all the time 2 L.  Did not strike her head, injure her neck, back or experience any numbness tingling weakness or chest pain prior to any of her falls or during the fall.  She reports just slightly sore over the ribs of the chest wall, but mostly tender over the kneecap of the left knee without hip pain.  No numbness tingling or weakness in the hands or arms.  Does report a little bit of soreness over the left hand.  I reviewed her x-rays, asked her about this, and she reports that her knuckles are normal and that she is able to bend them and move them normally and that her second and third knuckles on the left hand have always been enlarged and she has not noticed any pain there today.   Past Medical History:  Diagnosis Date  . AAA (abdominal aortic aneurysm) ([place])   . Anxiety   . Aortic aneurysm ([place])   . Arthritis   . Barrett's esophagus 2007  . Bursitis 2012   left hip, improved with periodic steroid injection [facility] [place])  . COPD (chronic obstructive pulmonary disease) ([place])   . Coronary artery disease    a. LHC (08/27/16): 15% ostial LM, 99% pLAD and D1,  Ramus & LCx nl, m/dRCA diffuse 80-90%, PDA occluded proximally, fills via L-R collats, EF 50-55%; b. PCI (08/31/16): Severe pLAD dz involving D1, successful orbital atherectomy PCI to pLAD w/ Synergy 3.0 x 20 mm DES, 0% residual stenosis & TIMI 3 flow, occluded ostD1, medically managed given lack of CP  . Depression   . Diastolic dysfunction    a. TTE (08/25/16): Mild LVH with LVEF of 50-55% and grade 1 diastolic dysfunction. Mild MR. Mild right atrial enlargement. Normal RV size and function  . GERD (gastroesophageal reflux disease)    with Barretts Esophagus  . GI bleed 03/26/2017  . GI bleeding    a. required pRBC May, July, and December 2018  . Hyperlipidemia   . Hypertension   . Iron deficiency anemia 2012  . Lumbago   . Macular degeneration   . Osteoporosis   . Shingles    right back/abdomen 08/16/17   . Sigmoid diverticulosis    by colonoscopy  . Stroke [facility])     Patient Active Problem List   Diagnosis Date Noted  . Unintentional weight loss 09/01/2017  . Chronic pain due to injury 09/01/2017  . Herpes zoster without complication 21/22/4825  . Hiatal hernia   . Diverticulosis of colon without diverticulitis   . GERD (gastroesophageal reflux disease) 11/05/2016  . CAD (coronary artery  disease) 08/28/2016  . Non-ST elevation (NSTEMI) myocardial infarction ([place]) 08/24/2016  . Chronic respiratory failure with hypoxia ([place]) 05/17/2016  . Generalized muscle weakness 04/16/2015  . Hospital discharge follow-up 04/16/2015  . Coronary artery disease due to lipid rich plaque 04/14/2015  . Anxiety 04/14/2015  . Bursitis of left hip 11/27/2014  . Iron deficiency anemia 11/25/2014  . History of fracture of pelvis or lower extremity 08/25/2014  . Vascular dementia with depressed mood ([place]) 02/20/2014  . Tobacco abuse counseling 04/10/2013  . Gastric ulcer requiring drug therapy   . Screening for colon cancer 06/18/2011  . COPD (chronic obstructive pulmonary disease) ([place]) 06/18/2011    . Skin cancer of nose 12/17/2010  . Vitamin D deficiency 12/17/2010  . Hyperlipidemia LDL goal <100 12/17/2010  . Screening for malignant neoplasm of breast 12/17/2010  . Encounter for long-term (current) use of other medications 12/17/2010  . S/P abdominal aortic aneurysm repair 12/17/2010  . Diverticulosis, sigmoid 12/08/2010  . Hyperlipidemia 12/08/2010  . Hypertension 12/08/2010  . Lumbago 12/08/2010  . Osteoporosis 12/08/2010  . Major depressive disorder, recurrent episode, moderate ([place]) 12/08/2010    Past Surgical History:  Procedure Laterality Date  . ABDOMINAL AORTIC ANEURYSM REPAIR  July 2010   [facility]  . APPENDECTOMY    . BACK SURGERY     X2..1975 arachnoid cyst cervical region([place]);1997 lumbosacral tumor,9 hr surgery  . CARDIAC CATHETERIZATION    . CARDIAC CATHETERIZATION    . cardiac stents    . COLONOSCOPY N/A 03/30/2017   Procedure: COLONOSCOPY;  Surgeon: Lin Landsman, MD;  Location: [facility] ENDOSCOPY;  Service: Gastroenterology;  Laterality: N/A;  . CORONARY ATHERECTOMY N/A 08/31/2016   Procedure: Coronary Atherectomy;  Surgeon: Nelva [place], MD;  Location: [place] CV LAB;  Service: Cardiovascular;  Laterality: N/A;  . ESOPHAGOGASTRODUODENOSCOPY N/A 03/29/2017   Procedure: ESOPHAGOGASTRODUODENOSCOPY (EGD);  Surgeon: Virgel Manifold, MD;  Location: [facility] ENDOSCOPY;  Service: Endoscopy;  Laterality: N/A;  . ESOPHAGOGASTRODUODENOSCOPY (EGD) WITH PROPOFOL N/A 09/02/2016   Procedure: ESOPHAGOGASTRODUODENOSCOPY (EGD) WITH PROPOFOL;  Surgeon: Otis Brace, MD;  Location: [facility] ENDOSCOPY;  Service: Gastroenterology;  Laterality: N/A;  . ESOPHAGOGASTRODUODENOSCOPY (EGD) WITH PROPOFOL N/A 11/06/2016   Procedure: ESOPHAGOGASTRODUODENOSCOPY (EGD) WITH PROPOFOL;  Surgeon: Lucilla Lame, MD;  Location: [facility] ENDOSCOPY;  Service: Endoscopy;  Laterality: N/A;  . FLEXIBLE SIGMOIDOSCOPY N/A 03/29/2017   Procedure: FLEXIBLE SIGMOIDOSCOPY;  Surgeon: Virgel Manifold, MD;  Location: [facility] ENDOSCOPY;  Service: Endoscopy;  Laterality: N/A;  . INTRAVASCULAR ULTRASOUND/IVUS N/A 08/31/2016   Procedure: Intravascular Ultrasound/IVUS;  Surgeon: Nelva Bush, MD;  Location: [place] CV LAB;  Service: Cardiovascular;  Laterality: N/A;  . LEFT HEART CATH AND CORONARY ANGIOGRAPHY N/A 08/27/2016   Procedure: Left Heart Cath and Coronary Angiography;  Surgeon: Teodoro Spray, MD;  Location: [place] CV LAB;  Service: Cardiovascular;  Laterality: N/A;    Prior to Admission medications   Medication Sig Start Date End Date Taking? Authorizing Provider  albuterol (PROVENTIL HFA;VENTOLIN HFA) 108 (90 Base) MCG/ACT inhaler Inhale 2 puffs into the lungs every 6 (six) hours as needed for wheezing or shortness of breath. 11/11/15   Alfred Levins, [place], MD  atorvastatin (LIPITOR) 40 MG tablet TAKE 1 TABLET(40 MG) BY MOUTH DAILY AT 6 PM 01/07/18   Crecencio Mc, MD  budesonide-formoterol (SYMBICORT) 80-4.5 MCG/ACT inhaler Inhale 2 puffs into the lungs 2 (two) times daily. 05/23/18   Crecencio Mc, MD  clopidogrel (PLAVIX) 75 MG tablet TAKE 1 TABLET(75 MG) BY MOUTH DAILY 01/23/18   Derrel Nip,  Aris Everts, MD  diazepam (VALIUM) 5 MG tablet Take 1 tablet (5 mg total) by mouth 2 (two) times daily as needed for anxiety. 05/02/18   Crecencio Mc, MD  ergocalciferol (DRISDOL) 1.25 MG (50000 UT) capsule Take 1 capsule (50,000 Units total) by mouth once a week. 05/02/18   Crecencio Mc, MD  ferrous sulfate 325 (65 FE) MG tablet Take 1 tablet (325 mg total) by mouth daily with breakfast. Patient not taking: Reported on 05/02/2018 09/03/16   Lendon Colonel, NP  HYDROcodone-acetaminophen (NORCO/VICODIN) 5-325 MG tablet Take 1 tablet by mouth every 6 (six) hours as needed for moderate pain. Maximum 2 daily. Patient not taking: Reported on 05/02/2018 12/02/17   Crecencio Mc, MD  HYDROcodone-acetaminophen (NORCO/VICODIN) 5-325 MG tablet Take 1 tablet by mouth every 6 (six) hours as  needed for moderate pain. Maximum 2 daily. Patient not taking: Reported on 05/02/2018 01/31/18   Crecencio Mc, MD  HYDROcodone-acetaminophen (NORCO/VICODIN) 5-325 MG tablet Take 1 tablet by mouth every 6 (six) hours as needed for moderate pain. Maximum 2 daily. Patient not taking: Reported on 05/02/2018 01/31/18   Crecencio Mc, MD  HYDROcodone-acetaminophen (NORCO/VICODIN) 5-325 MG tablet Take 1 tablet by mouth every 6 (six) hours as needed for moderate pain. Maximum 2 daily. 05/02/18   Crecencio Mc, MD  megestrol (MEGACE) 40 MG tablet TAKE 1 TABLET(40 MG) BY MOUTH DAILY Patient not taking: Reported on 05/02/2018 02/06/17   Crecencio Mc, MD  mirtazapine (REMERON) 45 MG tablet TAKE 1 TABLET(45 MG) BY MOUTH AT BEDTIME 04/24/18   Crecencio Mc, MD  nitroGLYCERIN (NITROSTAT) 0.4 MG SL tablet Place 1 tablet (0.4 mg total) under the tongue every 5 (five) minutes x 3 doses as needed for chest pain. 09/28/16   Crecencio Mc, MD  pantoprazole (PROTONIX) 40 MG tablet TAKE 1 TABLET(40 MG) BY MOUTH DAILY 05/02/18   Crecencio Mc, MD  PARoxetine (PAXIL) 30 MG tablet TAKE 1 TABLET BY MOUTH DAILY Patient not taking: Reported on 05/02/2018 02/07/17   Crecencio Mc, MD  PARoxetine (PAXIL) 30 MG tablet TAKE 1 TABLET BY MOUTH DAILY Patient not taking: Reported on 05/02/2018 01/23/18   Crecencio Mc, MD  valACYclovir (VALTREX) 1000 MG tablet Take 1 tablet (1,000 mg total) by mouth 2 (two) times daily. 08/16/17   McLean-Scocuzza, Nino Glow, MD    Allergies Morphine sulfate  Family History  Problem Relation Age of Onset  . Coronary artery disease Mother   . Alzheimer's disease Mother   . Coronary artery disease Father 43  . Heart attack Father   . Throat cancer Brother   . Lung cancer Maternal Aunt   . Kidney cancer Daughter   . Cervical cancer Daughter     Social History Social History   Tobacco Use  . Smoking status: Current Every Day Smoker    Packs/day: 0.50    Types: Cigarettes  .  Smokeless tobacco: Never Used  . Tobacco comment: HAS BEEN SMOKING 30+ YEARS,RESUMED TOBACCO USE AFTER AAA REPAIR  Substance Use Topics  . Alcohol use: No  . Drug use: No    Review of Systems Constitutional: No fever/chills or recent illness Eyes: No visual changes. ENT: No sore throat. Cardiovascular: Denies chest pain. Respiratory: Denies shortness of breath. Gastrointestinal: No abdominal pain.   Genitourinary: Negative for dysuria. Musculoskeletal: Negative for back pain.  Some discomfort over the left front of the knee.  Also a little bit sore over the left hand.  Skin: Negative for rash. Neurological: Negative for headaches, areas of focal weakness or numbness.    ____________________________________________   PHYSICAL EXAM:  VITAL SIGNS: ED Triage Vitals  Enc Vitals Group     BP 06/06/18 1900 (!) 185/73     Pulse Rate 06/06/18 1900 79     Resp 06/06/18 1900 15     Temp 06/06/18 1900 98.6 F (37 C)     Temp Source 06/06/18 1900 Oral     SpO2 06/06/18 1900 100 %     Weight 06/06/18 1906 114 lb (51.7 kg)     Height 06/06/18 1906 5\' 5"  (1.651 m)     Head Circumference --      Peak Flow --      Pain Score 06/06/18 1905 8     Pain Loc --      Pain Edu? --      Excl. in [place]? --     Constitutional: Alert and oriented. Well appearing and in no acute distress.  She is very pleasant.  Normal oxygen saturation on room air. Eyes: Conjunctivae are normal. Head: Atraumatic. Nose: No congestion/rhinnorhea. Mouth/Throat: Mucous membranes are moist. Neck: No stridor.  Cardiovascular: Normal rate, regular rhythm. Grossly normal heart sounds.  Good peripheral circulation. Respiratory: Normal respiratory effort.  No retractions. Lungs CTAB. Gastrointestinal: Soft and nontender. No distention. Musculoskeletal: No lower extremity tenderness nor edema versus some tenderness over the anterior left kneecap without evidence of swelling bruising or obvious injury.  Full range of  motion of both hips without pain.  No pain on axial loading.  Strong capillary refill both lower extremities normal dorsalis pedis bilateral.  RIGHT Right upper extremity demonstrates normal strength, good use of all muscles. No edema bruising or contusions of the right shoulder/upper arm, right elbow, right forearm / hand. Full range of motion of the right right upper extremity without pain. No evidence of trauma. Strong radial pulse. Intact median/ulnar/radial neuro-muscular exam.  LEFT Left upper extremity demonstrates normal strength, good use of all muscles. No edema bruising or contusions of the left shoulder/upper arm, left elbow, left forearm / hand. Full range of motion of the left  upper extremity without pain. No evidence of trauma. Strong radial pulse. Intact median/ulnar/radial neuro-muscular exam.   Neurologic:  Normal speech and language. No gross focal neurologic deficits are appreciated.  Skin:  Skin is warm, dry and intact. No rash noted. Psychiatric: Mood and affect are normal. Speech and behavior are normal.  ____________________________________________   LABS (all labs ordered are listed, but only abnormal results are displayed)  Labs Reviewed  CBC - Abnormal; Notable for the following components:      Result Value   RBC 3.42 (*)    Hemoglobin 10.4 (*)    HCT 32.7 (*)    All other components within normal limits  BASIC METABOLIC PANEL - Abnormal; Notable for the following components:   BUN 27 (*)    Creatinine, Ser 1.17 (*)    GFR calc non Af Amer 43 (*)    GFR calc Af Amer 50 (*)    All other components within normal limits  URINALYSIS, ROUTINE W REFLEX MICROSCOPIC   ____________________________________________  EKG  Reviewed entered by me at 2155 Heart rate 75 QRS 90 QTc 500 Normal sinus rhythm, probable left ventricular hypertrophy.  Compared with her previous EKG from 2018, no significant changes  noted. ____________________________________________  [id number]  Dg Ribs Unilateral W/chest Left  Result Date: 06/06/2018 CLINICAL DATA:  Fall. EXAM: LEFT  RIBS AND CHEST - 3+ VIEW COMPARISON:  08/24/2016 chest radiographs FINDINGS: The cardiomediastinal silhouette is unchanged with normal heart size. A hiatal hernia is again noted. The lungs remain hyperintense crashed at aortic atherosclerosis is noted. The lungs are hyperinflated without evidence of airspace consolidation, edema, pleural effusion, pneumothorax. Nipple shadows are noted. There is thoracolumbar scoliosis. The bones are diffusely osteopenic. No rib fracture is identified. IMPRESSION: No rib fracture identified. Electronically Signed   By: Logan Bores M.D.   On: 06/06/2018 20:30   Dg Knee Complete 4 Views Left  Result Date: 06/06/2018 CLINICAL DATA:  Status post fall 2 times tonight. EXAM: LEFT KNEE - COMPLETE 4+ VIEW COMPARISON:  None. FINDINGS: No evidence of fracture, dislocation. A small suprapatellar effusion is noted. Narrow femoral tibial joint spaces are identified. Chondrocalcinosis is identified in femoral tibial joint space. Soft tissues are unremarkable. IMPRESSION: No acute fracture or dislocation noted. Electronically Signed   By: Abelardo Diesel M.D.   On: 06/06/2018 22:25   Dg Hand Complete Left  Result Date: 06/06/2018 CLINICAL DATA:  Status post fall. EXAM: LEFT HAND - COMPLETE 3+ VIEW COMPARISON:  None. FINDINGS: There is subluxation of the second and third metacarpal phalangeal joints. Degenerative joint changes of distal interphalangeal joints are noted. IMPRESSION: Subluxation of the second and third metacarpophalangeal joints. Degenerative joint changes of distal interphalangeal joints. Electronically Signed   By: Abelardo Diesel M.D.   On: 06/06/2018 20:32    Reviewed and and imaging negative largely for any injury.  There is a question of third and second metacarpal phalangeal joint subluxation, but the patient  denies pain in this area and reports good and normal use of this part of the hand.  She reports this area has been noted to have large knuckles for years, and reports it may have been from some type of previous injury but not from today. ____________________________________________   PROCEDURES  Procedure(s) performed: None  Procedures  Critical Care performed: No  ____________________________________________   INITIAL IMPRESSION / ASSESSMENT AND PLAN / ED COURSE  Pertinent labs & imaging results that were available during my care of the patient were reviewed by me and considered in my medical decision making (see chart for details).   2 falls.  Neither of which are associated with any preceding symptoms, the second 1 likely secondary to knee discomfort from the first fall.  Denies head strike.  Very normal level of alertness without evidence of traumatic injury.  No neck pain.  No numbness tingling or weakness anywhere.  Did discuss with the patient given 2 falls, encouraged her to get lab work and she was agreeable with this but did not wish to provide urine sample stating she is having no urinary symptoms.  No associated chest pain except for a little bit of soreness where she reports she fell onto her chest wall without evidence of acute cardiac etiology.    Clinical Course as of Jun 06 2357  Fri Jun 06, 2018  2210 Left hand subluxations are noted, but patient reports that her second and third digits do not cause any pain or discomfort.  She reports she just has a little bit of tenderness over the fifth metacarpal.  She has full range of motion of the left hand and reports that her joints do not seem to be swollen or painful, and that her hand is working normally.  She demonstrates normal median ulnar and radial muscular neurovascular examinations with normal cap refill in all digits.  Able  to fully open and close extend flex the hand and all fingers including the metacarpal phalangeal  joints.   [MQ]  2255 Hemoglobin(!): 10.4 [MQ]  2255 Hgb slight decrease. Denies any black, bloody stools, or blood loss.   [MQ]    Clinical Course User Index [MQ] Delman Kitten, MD   I did discuss with Dr. Sabra Heck the patient's left hand x-ray, he reviewed it given the patient's normal examination he advised may follow-up with orthopedics if needed.  I would agree, patient denies any symptoms that would suggest a acute subluxation, query if this could be some type of chronic etiology.   Patient able to ambulate well with a walker after imaging, she reports she feels well would like to be discharged home.  Discussed with patient will discharge home with close follow-up.  ____________________________________________   FINAL CLINICAL IMPRESSION(S) / ED DIAGNOSES  Final diagnoses:  Fall, initial encounter  Contusion of left knee, initial encounter  Contusion of left hand, initial encounter        Note:  This document was prepared using Dragon voice recognition software and may include unintentional dictation errors       Delman Kitten, MD 06/07/18 0107

## 2018-06-17 ENCOUNTER — Telehealth: Payer: Self-pay

## 2018-06-17 NOTE — Telephone Encounter (Signed)
Copied from [place] [phone] [id number]. Topic: General - Inquiry >> Jun 17, 2018 11:46 AM Vernona Rieger wrote: Reason for CRM: patient's daughter [name] is calling back to speak with Rasheedah regarding an appt. She said she can not due Friday. She said she thought this was either a physical or a hospital fu. She was unsure. Please call back

## 2018-06-19 ENCOUNTER — Telehealth: Payer: Self-pay | Admitting: Internal Medicine

## 2018-06-19 MED ORDER — HYDROCODONE-ACETAMINOPHEN 5-325 MG PO TABS: 1.0000 | ORAL_TABLET | Freq: Four times a day (QID) | ORAL | 0 refills | Status: DC | PRN

## 2018-06-19 NOTE — Telephone Encounter (Signed)
Ok. Thank you! Pt daughter was called back.

## 2018-06-19 NOTE — Telephone Encounter (Signed)
Refilled: 05/02/2018 Last OV: 05/02/2018 Next OV: 08/07/2018 

## 2018-06-19 NOTE — Telephone Encounter (Signed)
Pt daughter states pt needs a refill on HYDROcodone-acetaminophen (NORCO/VICODIN) 5-325 MG tablet  Please call pt daughter if she needs to pick up Rx. Thank you!  Call daughter @ [phone].

## 2018-06-19 NOTE — Telephone Encounter (Signed)
vicodin refill sent

## 2018-06-22 DIAGNOSIS — J449 Chronic obstructive pulmonary disease, unspecified: Secondary | ICD-10-CM | POA: Diagnosis not present

## 2018-06-22 DIAGNOSIS — J9611 Chronic respiratory failure with hypoxia: Secondary | ICD-10-CM | POA: Diagnosis not present

## 2018-07-04 ENCOUNTER — Other Ambulatory Visit: Payer: Self-pay

## 2018-07-04 ENCOUNTER — Other Ambulatory Visit: Payer: Self-pay | Admitting: Internal Medicine

## 2018-07-11 ENCOUNTER — Other Ambulatory Visit: Payer: Self-pay | Admitting: Internal Medicine

## 2018-07-21 ENCOUNTER — Other Ambulatory Visit: Payer: Self-pay | Admitting: Internal Medicine

## 2018-07-22 ENCOUNTER — Telehealth: Payer: Self-pay

## 2018-07-22 ENCOUNTER — Other Ambulatory Visit: Payer: Self-pay | Admitting: Internal Medicine

## 2018-07-22 MED ORDER — ERGOCALCIFEROL 1.25 MG (50000 UT) PO CAPS: 50000.0000 [IU] | ORAL_CAPSULE | ORAL | 3 refills | Status: AC

## 2018-07-22 NOTE — Addendum Note (Signed)
Addended by: Crecencio Mc on: 07/22/2018 11:26 AM   Modules accepted: Orders

## 2018-07-22 NOTE — Telephone Encounter (Signed)
Copied from [place] [phone]. Topic: Quick Communication - Rx Refill/Question >> Jul 22, 2018 12:24 PM Dimple Nanas, RN wrote: Medication:    HYDROcodone-acetaminophen (NORCO/VICODIN) 5-325 MG tablet  Has the patient contacted their pharmacy? No. (Agent: If no, request that the patient contact the pharmacy for the refill.) (Agent: If yes, when and what did the pharmacy advise?)  Preferred Pharmacy (with phone number or street name):    [facility] [place], [place]: Please be advised that RX refills may take up to 3 business days. We ask that you follow-up with your pharmacy.

## 2018-07-22 NOTE — Telephone Encounter (Signed)
Refilled: 06/19/2018 Last OV: 05/02/2018 Next OV: 08/07/2018

## 2018-07-22 NOTE — Telephone Encounter (Signed)
What medication

## 2018-07-22 NOTE — Telephone Encounter (Signed)
im sorry the Vitamin D2

## 2018-07-22 NOTE — Telephone Encounter (Signed)
Refilled: 05/02/2018 Last OV: 05/02/2018 Next OV: 08/07/2018

## 2018-07-22 NOTE — Telephone Encounter (Signed)
Thank, I her case I have refilled it because she is essentially housebound

## 2018-07-23 DIAGNOSIS — J449 Chronic obstructive pulmonary disease, unspecified: Secondary | ICD-10-CM | POA: Diagnosis not present

## 2018-07-23 DIAGNOSIS — J9611 Chronic respiratory failure with hypoxia: Secondary | ICD-10-CM | POA: Diagnosis not present

## 2018-07-23 MED ORDER — HYDROCODONE-ACETAMINOPHEN 5-325 MG PO TABS: 1.0000 | ORAL_TABLET | Freq: Four times a day (QID) | ORAL | 0 refills | Status: DC | PRN

## 2018-07-25 ENCOUNTER — Other Ambulatory Visit: Payer: Self-pay

## 2018-07-25 ENCOUNTER — Ambulatory Visit (INDEPENDENT_AMBULATORY_CARE_PROVIDER_SITE_OTHER): Payer: Medicare Other

## 2018-07-25 DIAGNOSIS — Z0000 Encounter for general adult medical examination without abnormal findings: Secondary | ICD-10-CM | POA: Diagnosis not present

## 2018-07-25 NOTE — Progress Notes (Addendum)
Subjective:   Anna Prince is a 85 y.o. female who presents for an Initial Medicare Annual Wellness Visit.  Review of Systems    No ROS.  Medicare Wellness Visit. Additional risk factors are reflected in the social history.  Cardiac Risk Factors include: advanced age (>68men, >91 women);hypertension;smoking/ tobacco exposure     Objective:    Today's Vitals   There is no height or weight on file to calculate BMI.   Vital signs UTA, virtual visit  Advanced Directives 07/25/2018 06/06/2018 08/09/2017 07/11/2017 05/08/2017 03/29/2017 03/26/2017  Does Patient Have a Medical Advance Directive? Yes Yes No Yes Yes Yes Yes  Type of Industrial/product designer of [place];Living will - Healthcare Power of Attorney Living will;Healthcare Power of [place]  Does patient want to make changes to medical advance directive? No - Patient declined No - Patient declined - No - Patient declined - - No - Patient declined  Copy of [place] in Chart? Yes - validated most recent copy scanned in chart (See row information) No - copy requested - No - copy requested - No - copy requested -  Would patient like information on creating a medical advance directive? - - - No - Patient declined - No - Patient declined No - Patient declined    Current Medications (verified) Outpatient Encounter Medications as of 07/25/2018  Medication Sig   albuterol (PROVENTIL HFA;VENTOLIN HFA) 108 (90 Base) MCG/ACT inhaler Inhale 2 puffs into the lungs every 6 (six) hours as needed for wheezing or shortness of breath.   atorvastatin (LIPITOR) 40 MG tablet TAKE 1 TABLET(40 MG) BY MOUTH DAILY AT 6 PM   clopidogrel (PLAVIX) 75 MG tablet TAKE 1 TABLET(75 MG) BY MOUTH DAILY   diazepam (VALIUM) 5 MG tablet Take 1 tablet (5 mg total) by mouth 2 (two) times daily as needed for anxiety.   ergocalciferol (DRISDOL) 1.25 MG  (50000 UT) capsule Take 1 capsule (50,000 Units total) by mouth once a week.   HYDROcodone-acetaminophen (NORCO/VICODIN) 5-325 MG tablet Take 1 tablet by mouth every 6 (six) hours as needed for moderate pain. Maximum 2 daily.   megestrol (MEGACE) 40 MG tablet TAKE 1 TABLET(40 MG) BY MOUTH DAILY   mirtazapine (REMERON) 45 MG tablet TAKE 1 TABLET BY MOUTH EVERY DAY AT BEDTIME   pantoprazole (PROTONIX) 40 MG tablet TAKE 1 TABLET(40 MG) BY MOUTH DAILY   PARoxetine (PAXIL) 30 MG tablet TAKE 1 TABLET BY MOUTH DAILY   SYMBICORT 80-4.5 MCG/ACT inhaler INHALE 2 PUFFS INTO THE LUNGS TWICE DAILY   ferrous sulfate 325 (65 FE) MG tablet Take 1 tablet (325 mg total) by mouth daily with breakfast. (Patient not taking: Reported on 05/02/2018)   nitroGLYCERIN (NITROSTAT) 0.4 MG SL tablet Place 1 tablet (0.4 mg total) under the tongue every 5 (five) minutes x 3 doses as needed for chest pain.   valACYclovir (VALTREX) 1000 MG tablet Take 1 tablet (1,000 mg total) by mouth 2 (two) times daily. (Patient not taking: Reported on 07/25/2018)   [DISCONTINUED] PARoxetine (PAXIL) 30 MG tablet TAKE 1 TABLET BY MOUTH DAILY (Patient not taking: Reported on 05/02/2018)   No facility-administered encounter medications on file as of 07/25/2018.     Allergies (verified) Morphine sulfate   History: Past Medical History:  Diagnosis Date   AAA (abdominal aortic aneurysm) (HCC)    Anxiety    Aortic aneurysm (HCC)    Arthritis  Barrett's esophagus 2007   Bursitis 2012   left hip, improved with periodic steroid injection [facility])   COPD (chronic obstructive pulmonary disease) (HCC)    Coronary artery disease    a. LHC (08/27/16): 15% ostial LM, 99% pLAD and D1, Ramus & LCx nl, m/dRCA diffuse 80-90%, PDA occluded proximally, fills via L-R collats, EF 50-55%; b. PCI (08/31/16): Severe pLAD dz involving D1, successful orbital atherectomy PCI to pLAD w/ Synergy 3.0 x 20 mm DES, 0% residual stenosis & TIMI 3  flow, occluded ostD1, medically managed given lack of CP   Depression    Diastolic dysfunction    a. TTE (08/25/16): Mild LVH with LVEF of 50-55% and grade 1 diastolic dysfunction. Mild MR. Mild right atrial enlargement. Normal RV size and function   GERD (gastroesophageal reflux disease)    with Barretts Esophagus   GI bleed 03/26/2017   GI bleeding    a. required pRBC May, July, and December 2018   Hyperlipidemia    Hypertension    Iron deficiency anemia 2012   Lumbago    Macular degeneration    Osteoporosis    Shingles    right back/abdomen 08/16/17    Sigmoid diverticulosis    by colonoscopy   Stroke [facility])    Past Surgical History:  Procedure Laterality Date   ABDOMINAL AORTIC ANEURYSM REPAIR  July 2010   [facility]   APPENDECTOMY     BACK SURGERY     X2..1975 arachnoid cyst cervical region(blumquist,gso);1997 lumbosacral tumor,9 hr surgery   CARDIAC CATHETERIZATION     CARDIAC CATHETERIZATION     cardiac stents     COLONOSCOPY N/A 03/30/2017   Procedure: COLONOSCOPY;  Surgeon: Lin Landsman, MD;  Location: [facility] ENDOSCOPY;  Service: Gastroenterology;  Laterality: N/A;   CORONARY ATHERECTOMY N/A 08/31/2016   Procedure: Coronary Atherectomy;  Surgeon: Nelva Bush, MD;  Location: [place] CV LAB;  Service: Cardiovascular;  Laterality: N/A;   ESOPHAGOGASTRODUODENOSCOPY N/A 03/29/2017   Procedure: ESOPHAGOGASTRODUODENOSCOPY (EGD);  Surgeon: Virgel Manifold, MD;  Location: [facility] ENDOSCOPY;  Service: Endoscopy;  Laterality: N/A;   ESOPHAGOGASTRODUODENOSCOPY (EGD) WITH PROPOFOL N/A 09/02/2016   Procedure: ESOPHAGOGASTRODUODENOSCOPY (EGD) WITH PROPOFOL;  Surgeon: Otis Brace, MD;  Location: [place];  Service: Gastroenterology;  Laterality: N/A;   ESOPHAGOGASTRODUODENOSCOPY (EGD) WITH PROPOFOL N/A 11/06/2016   Procedure: ESOPHAGOGASTRODUODENOSCOPY (EGD) WITH PROPOFOL;  Surgeon: Lucilla Lame, MD;  Location: [facility] ENDOSCOPY;  Service: Endoscopy;   Laterality: N/A;   FLEXIBLE SIGMOIDOSCOPY N/A 03/29/2017   Procedure: FLEXIBLE SIGMOIDOSCOPY;  Surgeon: Virgel Manifold, MD;  Location: [facility] ENDOSCOPY;  Service: Endoscopy;  Laterality: N/A;   INTRAVASCULAR ULTRASOUND/IVUS N/A 08/31/2016   Procedure: Intravascular Ultrasound/IVUS;  Surgeon: Nelva Bush, MD;  Location: [place] CV LAB;  Service: Cardiovascular;  Laterality: N/A;   LEFT HEART CATH AND CORONARY ANGIOGRAPHY N/A 08/27/2016   Procedure: Left Heart Cath and Coronary Angiography;  Surgeon: Teodoro Spray, MD;  Location: [place] CV LAB;  Service: Cardiovascular;  Laterality: N/A;   Family History  Problem Relation Age of Onset   Coronary artery disease Mother    Alzheimer's disease Mother    Coronary artery disease Father 40   Heart attack Father    Throat cancer Brother    Lung cancer Maternal Aunt    Kidney cancer Daughter    Cervical cancer Daughter    Social History   Socioeconomic History   Marital status: Widowed    Spouse name: Not on file   Number of children: Not  on file   Years of education: Not on file   Highest education level: Not on file  Occupational History   Not on file  Social Needs   Financial resource strain: Not hard at all   Food insecurity:    Worry: Never true    Inability: Never true   Transportation needs:    Medical: No    Non-medical: No  Tobacco Use   Smoking status: Current Every Day Smoker    Packs/day: 0.50    Types: Cigarettes   Smokeless tobacco: Never Used   Tobacco comment: HAS BEEN SMOKING 30+ YEARS,RESUMED TOBACCO USE AFTER AAA REPAIR  Substance and Sexual Activity   Alcohol use: No   Drug use: No   Sexual activity: Never  Lifestyle   Physical activity:    Days per week: 0 days    Minutes per session: Not on file   Stress: Not at all  Relationships   Social connections:    Talks on phone: Not on file    Gets together: Not on file    Attends religious service: Not on file      Active member of club or organization: Not on file    Attends meetings of clubs or organizations: Not on file    Relationship status: Not on file  Other Topics Concern   Not on file  Social History Narrative   Lives with daughter and grandson and great grand    Tobacco Counseling Ready to quit: Not Answered Counseling given: Not Answered Comment: HAS BEEN SMOKING 30+ YEARS,RESUMED TOBACCO USE AFTER AAA REPAIR   Clinical Intake:  Pre-visit preparation completed: Yes        Diabetes: No  How often do you need to have someone help you when you read instructions, pamphlets, or other written materials from your doctor or pharmacy?: 4 - Often  Interpreter Needed?: No    Activities of Daily Living In your present state of health, do you have any difficulty performing the following activities: 07/25/2018  Hearing? N  Vision? N  Difficulty concentrating or making decisions? Y  Comment Notes some difficulty remembering and concentrating. Hx of vascular dementia.   Walking or climbing stairs? Y  Comment Unsteady gait. Walker/cane in use when ambulating.  Dressing or bathing? N  Doing errands, shopping? Y  Comment She no longer drives. Accompanied by her daughters when running errands or visiting a doctor's office.   Preparing Food and eating ? Y  Comment She does not cook. Daughter assists. Self feeds.   Using the Toilet? N  In the past six months, have you accidently leaked urine? Y  Comment Managed with daily incontinence/Depends brief.   Do you have problems with loss of bowel control? Y  Comment Some diarhea and loose stool with certain foods. Tries to monitor and avoid.   Managing your Medications? N  Managing your Finances? Y  Comment Daughters manages.  Housekeeping or managing your Housekeeping? Y  Comment Daughters assists.  Some recent data might be hidden     Immunizations and Health Maintenance Immunization History  Administered Date(s) Administered    Influenza Split 08/22/2011   Influenza, High Dose Seasonal PF 02/10/2015, 01/09/2016, 01/28/2017, 05/02/2018   Influenza-Unspecified 12/22/2013   Pneumococcal Conjugate-13 12/23/2011   Pneumococcal Polysaccharide-23 02/18/2014   Tdap 12/22/2012   There are no preventive care reminders to display for this patient.  Patient Care Team: Crecencio Mc, MD as PCP - General (Internal Medicine) Lin Landsman, MD as Consulting Physician (  Gastroenterology) Stanford Breed Denice Bors, MD as Consulting Physician (Cardiology)  Indicate any recent Medical Services you may have received from other than [facility] providers in the past year (date may be approximate).     Assessment:   This is a routine wellness examination for Anna Prince.  I connected with Anna Prince on 07/25/18 at 11:30 AM EDT by a video enabled telemedicine application and verified that I am speaking with the correct person using two identifiers.  Information received from patient and her daughter [name], (HIPAA compliant).   Reports feeling well and doing overall better.   Keep all routine maintenance appointments.  Next OV scheduled 08/07/18 at 11:00.  ED visit- 06/06/18 for fall.  Health Screenings  Mammogram -01/14/13 Colonoscopy -03/30/17 Bone Density -06/21/04 Osteoporosis.  Glaucoma-none Macular degeneration-yes Hearing -demonstrates normal hearing during conversation. Glucose - 06/06/18 (98) Cholesterol -12/02/17 (142) Dental -dentures Vision -macular degeneration. Oxygen- 2L in use at bedtime.  Social  Alcohol intake -none Smoking history- current Smokers in home? Yes, self and daughters, no plan to stop. Illicit drug use? none Exercise -she plans to walk a little more with daughters outside as the weather warms. Diet -Ensure between meals. Regular diet. Drinking plenty of fluids. Sexually Active -no  Safety  Patient feels safe at home. Lives with her daughters.  Patient does have smoke detectors at home  Patient does  wear sunscreen or protective clothing when in direct sunlight. Patient does wear seat belt riding with others. She does not drive.  Tub chair in use.   Activities of Daily Living Patient has help with household chores. Denies needing assistance with: feeding themselves, getting from bed to chair, getting to the toilet, bathing/showering, dressing. Daughter manages finances and prepares meals. Walker in use when ambulating indoors, cane in use when ambulating outdoors.   Depression Screen Patient denies losing interest in daily life, feeling hopeless, or crying easily over simple problems.   Medication- taking without issues and as directed.    Fall Screen No falls since last about 2 months ago. Followed by pcp.   Memory Screen Patient notes problems with memory. Patient is alert and oriented to person/place/and time. Correctly identified the president of the [place] and said the months of the year in reverse. States she reads at bedtime for brain engagement activity.   Immunizations The following Immunizations were discussed: Influenza, shingles, pneumonia, and tetanus.   Other Providers Patient Care Team: Crecencio Mc, MD as PCP - General (Internal Medicine) Lin Landsman, MD as Consulting Physician (Gastroenterology) Stanford Breed Denice Bors, MD as Consulting Physician (Cardiology)  Hearing/Vision screen Hearing Screening Comments: Demonstrates normal hearing during conversation.  Vision Screening Comments: Macular degeneration Wears corrective lenses when reading  Dietary issues and exercise activities discussed: Current Exercise Habits: The patient does not participate in regular exercise at present  Goals      Patient Stated    Increase physical activity (pt-stated)     Walk more for exercise      Depression Screen PHQ 2/9 Scores 07/25/2018 08/16/2017 04/09/2016 11/25/2014 06/15/2013  PHQ - 2 Score 0 0 0 0 0    Fall Risk Fall Risk  07/25/2018 08/16/2017 04/09/2016  11/25/2014 06/15/2013  Falls in the past year? 1 No No No No  Comment No falls since last assessed by pcp.  - - - -  Number falls in past yr: 0 - - - -  Injury with Fall? 1 - - - -  Follow up Falls prevention discussed - - - -  Cognitive Function:     6CIT Screen 07/25/2018  What Year? 0 points  What month? 0 points  What time? 0 points  Count back from 20 0 points  Months in reverse 0 points    Screening Tests Health Maintenance  Topic Date Due   INFLUENZA VACCINE  11/22/2018   TETANUS/TDAP  12/23/2022   PNA vac Low Risk Adult  Completed      Plan:    End of life planning; Advance aging; Advanced directives discussed. Copy of current HCPOA/Living Will on file.    Keep all routine maintenance appointments.  Next OV scheduled 08/07/18 at 11:00.  I have personally reviewed and noted the following in the patients chart:    Medical and social history  Use of alcohol, tobacco or illicit drugs   Current medications and supplements  Functional ability and status  Nutritional status  Physical activity  Advanced directives  List of other physicians  Hospitalizations, surgeries, and ER visits in previous 12 months  Vitals  Screenings to include cognitive, depression, and falls  Referrals and appointments  In addition, I have reviewed and discussed with patient certain preventive protocols, quality metrics, and best practice recommendations. A written personalized care plan for preventive services as well as general preventive health recommendations were provided to patient.     OBrien-Blaney, Abbigal Radich L, LPN   09/22/7033     I have reviewed the above information and agree with above.   Deborra Medina, MD

## 2018-07-25 NOTE — Patient Instructions (Addendum)
  Ms. Beadle , Thank you for taking time to come for your Medicare Wellness Visit. I appreciate your ongoing commitment to your health goals. Please review the following plan we discussed and let me know if I can assist you in the future.   These are the goals we discussed: Goals      Patient Stated   . Increase physical activity (pt-stated)     Walk more for exercise       This is a list of the screening recommended for you and due dates:  Health Maintenance  Topic Date Due  . Flu Shot  11/22/2018  . Tetanus Vaccine  12/23/2022  . Pneumonia vaccines  Completed

## 2018-08-07 ENCOUNTER — Ambulatory Visit: Payer: Medicare Other

## 2018-08-07 ENCOUNTER — Other Ambulatory Visit: Payer: Self-pay

## 2018-08-07 ENCOUNTER — Ambulatory Visit (INDEPENDENT_AMBULATORY_CARE_PROVIDER_SITE_OTHER): Payer: Medicare Other | Admitting: Internal Medicine

## 2018-08-07 DIAGNOSIS — M545 Low back pain, unspecified: Secondary | ICD-10-CM

## 2018-08-07 DIAGNOSIS — G8929 Other chronic pain: Secondary | ICD-10-CM

## 2018-08-07 DIAGNOSIS — F0153 Vascular dementia, unspecified severity, with mood disturbance: Secondary | ICD-10-CM

## 2018-08-07 DIAGNOSIS — M6281 Muscle weakness (generalized): Secondary | ICD-10-CM

## 2018-08-07 DIAGNOSIS — J9611 Chronic respiratory failure with hypoxia: Secondary | ICD-10-CM | POA: Diagnosis not present

## 2018-08-07 DIAGNOSIS — F419 Anxiety disorder, unspecified: Secondary | ICD-10-CM | POA: Diagnosis not present

## 2018-08-07 DIAGNOSIS — F0151 Vascular dementia with behavioral disturbance: Secondary | ICD-10-CM | POA: Diagnosis not present

## 2018-08-07 DIAGNOSIS — F329 Major depressive disorder, single episode, unspecified: Secondary | ICD-10-CM

## 2018-08-07 MED ORDER — ALBUTEROL SULFATE HFA 108 (90 BASE) MCG/ACT IN AERS: 2.0000 | INHALATION_SPRAY | Freq: Four times a day (QID) | RESPIRATORY_TRACT | 2 refills | Status: DC | PRN

## 2018-08-07 MED ORDER — HYDROCODONE-ACETAMINOPHEN 5-325 MG PO TABS: 1.0000 | ORAL_TABLET | Freq: Four times a day (QID) | ORAL | 0 refills | Status: DC | PRN

## 2018-08-07 NOTE — Progress Notes (Signed)
Virtual Visit via [email or web address]   This visit type was conducted due to national recommendations for restrictions regarding the COVID-19 pandemic (e.g. social distancing).  This format is felt to be most appropriate for this patient at this time.  All issues noted in this document were discussed and addressed.  No physical exam was performed (except for noted visual exam findings with Video Visits).   I connected with@ on 08/07/18 at 11:00 AM EDT by a video enabled telemedicine application or telephone and verified that I am speaking with the correct person using two identifiers. Location patient: home Location provider: work or home office Persons participating in the virtual visit: patient, provider  I discussed the limitations, risks, security and privacy concerns of performing an evaluation and management service by telephone and the availability of in person appointments. I also discussed with the patient that there may be a patient responsible charge related to this service. The patient expressed understanding and agreed to proceed.  Interactive audio and video telecommunications were attempted between this provider and patient, however failed, due to patient having technical difficulties OR patient did not have access to video capability.  We continued and completed visit with audio only.   Reason for visit: follow up on hypertension, chronic MSK pain seconday to osteoporotic  fractures   HPI:  Treated in ED Feb 14 after a fall causing left knee pain  And a second fall onto chest ; rib and chest films , left hand and left knee films all negtaive for acute fractures.   Had another fall 3 weeks ago when she tried to sit on her porch swing and it swung away from her .  Landed on buttocks  Right chest wall hit the bar on the swing frame.  Was exquisetely painful without bruising,  getting better now   Chronic back pain managed with narcotics :  Refill history confirmed via [place] Controlled Substance  databas, accessed by me today.Marland Kitchen  COPD:  Still smoking,  using supplemental oxygen.  Appears to be at baseline    COVID-19 Education: The signs and symptoms of COVID-19 were discussed with the patient and how to seek care for testing (follow up with PCP or arrange E-visit).  The importance of social distancing was discussed today  ROS: See pertinent positives and negatives per HPI.  Past Medical History:  Diagnosis Date  . AAA (abdominal aortic aneurysm) ([place])   . Anxiety   . Aortic aneurysm ([place])   . Arthritis   . Barrett's esophagus 2007  . Bursitis 2012   left hip, improved with periodic steroid injection [facility])  . COPD (chronic obstructive pulmonary disease) ([place])   . Coronary artery disease    a. LHC (08/27/16): 15% ostial LM, 99% pLAD and D1, Ramus & LCx nl, m/dRCA diffuse 80-90%, PDA occluded proximally, fills via L-R collats, EF 50-55%; b. PCI (08/31/16): Severe pLAD dz involving D1, successful orbital atherectomy PCI to pLAD w/ Synergy 3.0 x 20 mm DES, 0% residual stenosis & TIMI 3 flow, occluded ostD1, medically managed given lack of CP  . Depression   . Diastolic dysfunction    a. TTE (08/25/16): Mild LVH with LVEF of 50-55% and grade 1 diastolic dysfunction. Mild MR. Mild right atrial enlargement. Normal RV size and function  . GERD (gastroesophageal reflux disease)    with Barretts Esophagus  . GI bleed 03/26/2017  . GI bleeding    a. required pRBC May, July, and December 2018  . Hyperlipidemia   .  Hypertension   . Iron deficiency anemia 2012  . Lumbago   . Macular degeneration   . Osteoporosis   . Shingles    right back/abdomen 08/16/17   . Sigmoid diverticulosis    by colonoscopy  . Stroke [facility])     Past Surgical History:  Procedure Laterality Date  . ABDOMINAL AORTIC ANEURYSM REPAIR  July 2010   [facility]  . APPENDECTOMY    . BACK SURGERY     X2..1975 arachnoid cyst cervical region([place]);1997 lumbosacral tumor,9 hr surgery  . CARDIAC  CATHETERIZATION    . CARDIAC CATHETERIZATION    . cardiac stents    . COLONOSCOPY N/A 03/30/2017   Procedure: COLONOSCOPY;  Surgeon: Lin Landsman, MD;  Location: [facility] ENDOSCOPY;  Service: Gastroenterology;  Laterality: N/A;  . CORONARY ATHERECTOMY N/A 08/31/2016   Procedure: Coronary Atherectomy;  Surgeon: Nelva [place], MD;  Location: [place] CV LAB;  Service: Cardiovascular;  Laterality: N/A;  . ESOPHAGOGASTRODUODENOSCOPY N/A 03/29/2017   Procedure: ESOPHAGOGASTRODUODENOSCOPY (EGD);  Surgeon: Virgel Manifold, MD;  Location: [facility] ENDOSCOPY;  Service: Endoscopy;  Laterality: N/A;  . ESOPHAGOGASTRODUODENOSCOPY (EGD) WITH PROPOFOL N/A 09/02/2016   Procedure: ESOPHAGOGASTRODUODENOSCOPY (EGD) WITH PROPOFOL;  Surgeon: Otis Brace, MD;  Location: [facility] ENDOSCOPY;  Service: Gastroenterology;  Laterality: N/A;  . ESOPHAGOGASTRODUODENOSCOPY (EGD) WITH PROPOFOL N/A 11/06/2016   Procedure: ESOPHAGOGASTRODUODENOSCOPY (EGD) WITH PROPOFOL;  Surgeon: Lucilla Lame, MD;  Location: [facility] ENDOSCOPY;  Service: Endoscopy;  Laterality: N/A;  . FLEXIBLE SIGMOIDOSCOPY N/A 03/29/2017   Procedure: FLEXIBLE SIGMOIDOSCOPY;  Surgeon: Virgel Manifold, MD;  Location: [facility] ENDOSCOPY;  Service: Endoscopy;  Laterality: N/A;  . INTRAVASCULAR ULTRASOUND/IVUS N/A 08/31/2016   Procedure: Intravascular Ultrasound/IVUS;  Surgeon: Nelva [place], MD;  Location: [place] CV LAB;  Service: Cardiovascular;  Laterality: N/A;  . LEFT HEART CATH AND CORONARY ANGIOGRAPHY N/A 08/27/2016   Procedure: Left Heart Cath and Coronary Angiography;  Surgeon: Teodoro Spray, MD;  Location: [place] CV LAB;  Service: Cardiovascular;  Laterality: N/A;    Family History  Problem Relation Age of Onset  . Coronary artery disease Mother   . Alzheimer's disease Mother   . Coronary artery disease Father 44  . Heart attack Father   . Throat cancer Brother   . Lung cancer Maternal Aunt   . Kidney cancer Daughter   . Cervical  cancer Daughter     SOCIAL HX: divorced   Lives with daughter [name],   Current Outpatient Medications:  .  albuterol (PROVENTIL HFA;VENTOLIN HFA) 108 (90 Base) MCG/ACT inhaler, Inhale 2 puffs into the lungs every 6 (six) hours as needed for wheezing or shortness of breath., Disp: 1 Inhaler, Rfl: 2 .  atorvastatin (LIPITOR) 40 MG tablet, TAKE 1 TABLET(40 MG) BY MOUTH DAILY AT 6 PM, Disp: 90 tablet, Rfl: 1 .  clopidogrel (PLAVIX) 75 MG tablet, TAKE 1 TABLET(75 MG) BY MOUTH DAILY, Disp: 90 tablet, Rfl: 1 .  diazepam (VALIUM) 5 MG tablet, Take 1 tablet (5 mg total) by mouth 2 (two) times daily as needed for anxiety., Disp: 60 tablet, Rfl: 5 .  ergocalciferol (DRISDOL) 1.25 MG (50000 UT) capsule, Take 1 capsule (50,000 Units total) by mouth once a week., Disp: 12 capsule, Rfl: 3 .  ferrous sulfate 325 (65 FE) MG tablet, Take 1 tablet (325 mg total) by mouth daily with breakfast., Disp: 30 tablet, Rfl: 3 .  HYDROcodone-acetaminophen (NORCO/VICODIN) 5-325 MG tablet, Take 1 tablet by mouth every 6 (six) hours as needed for moderate pain. Maximum 2 daily., Disp: 60 tablet,  Rfl: 0 .  megestrol (MEGACE) 40 MG tablet, TAKE 1 TABLET(40 MG) BY MOUTH DAILY, Disp: 90 tablet, Rfl: 0 .  mirtazapine (REMERON) 45 MG tablet, TAKE 1 TABLET BY MOUTH EVERY DAY AT BEDTIME, Disp: 90 tablet, Rfl: 1 .  nitroGLYCERIN (NITROSTAT) 0.4 MG SL tablet, Place 1 tablet (0.4 mg total) under the tongue every 5 (five) minutes x 3 doses as needed for chest pain., Disp: 90 tablet, Rfl: 1 .  pantoprazole (PROTONIX) 40 MG tablet, TAKE 1 TABLET(40 MG) BY MOUTH DAILY, Disp: 90 tablet, Rfl: 2 .  PARoxetine (PAXIL) 30 MG tablet, TAKE 1 TABLET BY MOUTH DAILY, Disp: 90 tablet, Rfl: 1 .  SYMBICORT 80-4.5 MCG/ACT inhaler, INHALE 2 PUFFS INTO THE LUNGS TWICE DAILY, Disp: 10.2 g, Rfl: 0 .  valACYclovir (VALTREX) 1000 MG tablet, Take 1 tablet (1,000 mg total) by mouth 2 (two) times daily. (Patient not taking: Reported on 07/25/2018), Disp: 28 tablet,  Rfl: 0  EXAM:  VITALS per patient if applicable:  GENERAL: alert, oriented, appears well and in no acute distress  HEENT: atraumatic, conjunttiva clear, no obvious abnormalities on inspection of external nose and ears  NECK: normal movements of the head and neck  LUNGS: on inspection no signs of respiratory distress, breathing rate appears normal, no obvious gross SOB, gasping or wheezing  CV: no obvious cyanosis  MS: moves all visible extremities without noticeable abnormality  PSYCH/NEURO: pleasant and cooperative, no obvious depression or anxiety, speech and thought processing grossly intact  ASSESSMENT AND PLAN:  Discussed the following assessment and plan:  No diagnosis found.  No problem-specific Assessment & Plan notes found for this encounter.    I discussed the assessment and treatment plan with the patient. The patient was provided an opportunity to ask questions and all were answered. The patient agreed with the plan and demonstrated an understanding of the instructions.   The patient was advised to call back or seek an in-person evaluation if the symptoms worsen or if the condition fails to improve as anticipated.  I provided 25 minutes of non-face-to-face time during this encounter.   Crecencio Mc, MD

## 2018-08-07 NOTE — Patient Instructions (Signed)
Anna Prince does not need to resume blood pressure medication until her home readings are over 140/80.    Please notify me if that happens repeatedly,  And I will choose another medication OTHER THAN lisinopril  I would like to see you in  3 months

## 2018-08-09 NOTE — Assessment & Plan Note (Signed)
Aggravated by daughter's serious health problems and her own end stage COPD.  Continue temazepam for anxiety , valium for daytime use . The risks and benefits of benzodiazepine use were discussed with patient today including excessive sedation leading to respiratory depression,  impaired thinking/driving, and addiction.  Patient was advised to avoid concurrent use with alcohol, to use medication only as needed and not to share with others  .

## 2018-08-09 NOTE — Assessment & Plan Note (Signed)
She is living with  Daughter [name]  Who manages her medications.  She appears to be doing well other than having more frequent falls.  She does hot want home  physical therapy

## 2018-08-09 NOTE — Assessment & Plan Note (Signed)
She has chronic pain from osteoporotic fractures and DJD hips.  She is not a candidate for NSAIDs due to history of NSAID induced ulcer and CKD.Marland KitchenRefill history confirmed via [place] Controlled Substance databas, accessed by me today..refill of Vicodin for use bid to be given for 3 months   Lab Results  Component Value Date   CREATININE 1.17 (H) 06/06/2018

## 2018-08-09 NOTE — Assessment & Plan Note (Signed)
Chronic , resulting in multiple falls .  She is using a walker ,  And refuses PT

## 2018-08-09 NOTE — Assessment & Plan Note (Signed)
Secondary to late stage COPd with ongoing tobacco abuse.  She is aware of the risks of smoking while using supplemental oxygen.  She is not tachypneic or hypoxic  today

## 2018-08-19 ENCOUNTER — Other Ambulatory Visit: Payer: Self-pay

## 2018-08-19 MED ORDER — SYMBICORT 80-4.5 MCG/ACT IN AERO: 2.0000 | INHALATION_SPRAY | Freq: Two times a day (BID) | RESPIRATORY_TRACT | 2 refills | Status: DC

## 2018-08-21 ENCOUNTER — Emergency Department
Admission: EM | Admit: 2018-08-21 | Discharge: 2018-08-21 | Disposition: A | Discharge status: Home / Self Care | Payer: Medicare Other | Attending: Emergency Medicine | Admitting: Emergency Medicine

## 2018-08-21 ENCOUNTER — Other Ambulatory Visit: Payer: Self-pay

## 2018-08-21 ENCOUNTER — Ambulatory Visit: Payer: Self-pay

## 2018-08-21 DIAGNOSIS — K529 Noninfective gastroenteritis and colitis, unspecified: Secondary | ICD-10-CM | POA: Insufficient documentation

## 2018-08-21 DIAGNOSIS — J449 Chronic obstructive pulmonary disease, unspecified: Secondary | ICD-10-CM | POA: Diagnosis not present

## 2018-08-21 DIAGNOSIS — Z79899 Other long term (current) drug therapy: Secondary | ICD-10-CM | POA: Diagnosis not present

## 2018-08-21 DIAGNOSIS — I10 Essential (primary) hypertension: Secondary | ICD-10-CM | POA: Insufficient documentation

## 2018-08-21 DIAGNOSIS — E785 Hyperlipidemia, unspecified: Secondary | ICD-10-CM | POA: Diagnosis not present

## 2018-08-21 DIAGNOSIS — F17210 Nicotine dependence, cigarettes, uncomplicated: Secondary | ICD-10-CM | POA: Diagnosis not present

## 2018-08-21 DIAGNOSIS — R197 Diarrhea, unspecified: Secondary | ICD-10-CM | POA: Diagnosis not present

## 2018-08-21 LAB — CBC WITH DIFFERENTIAL/PLATELET
Abs Immature Granulocytes: 0.04 10*3/uL (ref 0.00–0.07)
Basophils Absolute: 0 10*3/uL (ref 0.0–0.1)
Basophils Relative: 0 %
Eosinophils Absolute: 0.4 10*3/uL (ref 0.0–0.5)
Eosinophils Relative: 4 %
HCT: 30.6 % — ABNORMAL LOW (ref 36.0–46.0)
Hemoglobin: 9.7 g/dL — ABNORMAL LOW (ref 12.0–15.0)
Immature Granulocytes: 0 %
Lymphocytes Relative: 7 %
Lymphs Abs: 0.7 10*3/uL (ref 0.7–4.0)
MCH: 30.4 pg (ref 26.0–34.0)
MCHC: 31.7 g/dL (ref 30.0–36.0)
MCV: 95.9 fL (ref 80.0–100.0)
Monocytes Absolute: 0.6 10*3/uL (ref 0.1–1.0)
Monocytes Relative: 6 %
Neutro Abs: 8.4 10*3/uL — ABNORMAL HIGH (ref 1.7–7.7)
Neutrophils Relative %: 83 %
Platelets: 196 10*3/uL (ref 150–400)
RBC: 3.19 MIL/uL — ABNORMAL LOW (ref 3.87–5.11)
RDW: 13.4 % (ref 11.5–15.5)
WBC: 10.2 10*3/uL (ref 4.0–10.5)
nRBC: 0 % (ref 0.0–0.2)

## 2018-08-21 LAB — COMPREHENSIVE METABOLIC PANEL
ALT: 9 U/L (ref 0–44)
AST: 16 U/L (ref 15–41)
Albumin: 3 g/dL — ABNORMAL LOW (ref 3.5–5.0)
Alkaline Phosphatase: 81 U/L (ref 38–126)
Anion gap: 7 (ref 5–15)
BUN: 25 mg/dL — ABNORMAL HIGH (ref 8–23)
CO2: 23 mmol/L (ref 22–32)
Calcium: 8.5 mg/dL — ABNORMAL LOW (ref 8.9–10.3)
Chloride: 110 mmol/L (ref 98–111)
Creatinine, Ser: 1.14 mg/dL — ABNORMAL HIGH (ref 0.44–1.00)
GFR calc Af Amer: 51 mL/min — ABNORMAL LOW (ref >60)
GFR calc non Af Amer: 44 mL/min — ABNORMAL LOW (ref >60)
Glucose, Bld: 113 mg/dL — ABNORMAL HIGH (ref 70–99)
Potassium: 3.7 mmol/L (ref 3.5–5.1)
Sodium: 140 mmol/L (ref 135–145)
Total Bilirubin: 0.7 mg/dL (ref 0.3–1.2)
Total Protein: 6.4 g/dL — ABNORMAL LOW (ref 6.5–8.1)

## 2018-08-21 LAB — LIPASE, BLOOD: Lipase: 24 U/L (ref 11–51)

## 2018-08-21 MED ORDER — SODIUM CHLORIDE 0.9 % IV SOLN
1000.0000 mL | Freq: Once | INTRAVENOUS | Status: AC
  Administered 2018-08-21: 1000 mL via INTRAVENOUS

## 2018-08-21 MED ORDER — ONDANSETRON HCL 4 MG/2ML IJ SOLN
4.0000 mg | Freq: Once | INTRAMUSCULAR | Status: AC
  Administered 2018-08-21: 4 mg via INTRAVENOUS
  Filled 2018-08-21: qty 2

## 2018-08-21 NOTE — ED Notes (Addendum)
Pt states she spent night sitting on toilet due to continuous diarrhea, every time she thought she could get up, diarrhea would start again. Pt states only 1 episode of emesis. Pt received 500 mL bolus given by ems prior to arrival.

## 2018-08-21 NOTE — Telephone Encounter (Signed)
Daughter called to report worsening diarrhea over past 2-3 days; stated "it rolls out of her".  Stool is "clear with chunks of stool mixed in."  Stated "no blood in stool."  Reported pt. Had onset of vomiting last night at midnight.  Has vomited approx. 4 times since onset.  Denied abdominal pain.  C/o headache.  Daughter stated thermometer not working.  Does not know if pt. has fever.  Reported last significant fluid intake was a high protein shake at about 8:30 PM, 4/29.  Taking sips of water.  Daughter reported pt. Is very weak; difficult to get her to the bathroom, due to weakness.  Daughter thinks her urine output has decreased.  Advised due to concern for dehydration, pt. Needs to go to the ER.  Advised to call EMS, due to pt's weakness.  Daughter agreed.  Advised will send Triage note to Dr. Derrel Nip, to make her aware.    Reason for Disposition . [1] Drinking very little AND [2] dehydration suspected (e.g., no urine > 12 hours, very dry mouth, very lightheaded)  Answer Assessment - Initial Assessment Questions 1. DIARRHEA SEVERITY: "How bad is the diarrhea?" "How many extra stools have you had in the past 24 hours than normal?"    - NO DIARRHEA (SCALE 0)   - MILD (SCALE 1-3): Few loose or mushy BMs; increase of 1-3 stools over normal daily number of stools; mild increase in ostomy output.   -  MODERATE (SCALE 4-7): Increase of 4-6 stools daily over normal; moderate increase in ostomy output. * SEVERE (SCALE 8-10; OR 'WORST POSSIBLE'): Increase of 7 or more stools daily over normal; moderate increase in ostomy output; incontinence.     Severe; "it rolls out of her" 2. ONSET: "When did the diarrhea begin?"      Usually has diarrhea, but has worsened in past few days 3. BM CONSISTENCY: "How loose or watery is the diarrhea?"      Watery with chunks of stool 4. VOMITING: "Are you also vomiting?" If so, ask: "How many times in the past 24 hours?"      Vomiting started last night about 12:00 MN; has  vomited about 4 times since onset 5. ABDOMINAL PAIN: "Are you having any abdominal pain?" If yes: "What does it feel like?" (e.g., crampy, dull, intermittent, constant)      Denied abdominal pain 6. ABDOMINAL PAIN SEVERITY: If present, ask: "How bad is the pain?"  (e.g., Scale 1-10; mild, moderate, or severe)   - MILD (1-3): doesn't interfere with normal activities, abdomen soft and not tender to touch    - MODERATE (4-7): interferes with normal activities or awakens from sleep, tender to touch    - SEVERE (8-10): excruciating pain, doubled over, unable to do any normal activities      n/a 7. ORAL INTAKE: If vomiting, "Have you been able to drink liquids?" "How much fluids have you had in the past 24 hours?"     Very little intake; drank high protein shake about 8:30 PM 8. HYDRATION: "Any signs of dehydration?" (e.g., dry mouth [not just dry lips], too weak to stand, dizziness, new weight loss) "When did you last urinate?"     Increased weakness, decreased urine output,  9. EXPOSURE: "Have you traveled to a foreign country recently?" "Have you been exposed to anyone with diarrhea?" "Could you have eaten any food that was spoiled?"     No  10. ANTIBIOTIC USE: "Are you taking antibiotics now or have you taken antibiotics  in the past 2 months?"       No  11. OTHER SYMPTOMS: "Do you have any other symptoms?" (e.g., fever, blood in stool)       C/o headache 12. PREGNANCY: "Is there any chance you are pregnant?" "When was your last menstrual period?"       N/a  Protocols used: DIARRHEA-A-AH

## 2018-08-21 NOTE — ED Provider Notes (Signed)
[facility] Emergency Department Provider Note   ____________________________________________    I have reviewed the triage vital signs and the nursing notes.   HISTORY  Chief Complaint Emesis and Diarrhea     HPI Anna Prince is a 84 y.o. female who presents with complaints of diarrhea and vomiting.  Patient reports 3 days of diarrhea, last night she reports it was copious.  She also vomited once last night but did not feel particularly nauseated.  She describes some intermittent abdominal discomfort but none currently.  Has had some chills but no known fevers.  Has not take anything for this.  No sick contacts.  Has been staying at home.  She describes diarrhea as watery, nonbloody  Past Medical History:  Diagnosis Date  . AAA (abdominal aortic aneurysm) ([place])   . Anxiety   . Aortic aneurysm ([place])   . Arthritis   . Barrett's esophagus 2007  . Bursitis 2012   left hip, improved with periodic steroid injection [facility] [place])  . COPD (chronic obstructive pulmonary disease) ([place])   . Coronary artery disease    a. LHC (08/27/16): 15% ostial LM, 99% pLAD and D1, Ramus & LCx nl, m/dRCA diffuse 80-90%, PDA occluded proximally, fills via L-R collats, EF 50-55%; b. PCI (08/31/16): Severe pLAD dz involving D1, successful orbital atherectomy PCI to pLAD w/ Synergy 3.0 x 20 mm DES, 0% residual stenosis & TIMI 3 flow, occluded ostD1, medically managed given lack of CP  . Depression   . Diastolic dysfunction    a. TTE (08/25/16): Mild LVH with LVEF of 50-55% and grade 1 diastolic dysfunction. Mild MR. Mild right atrial enlargement. Normal RV size and function  . GERD (gastroesophageal reflux disease)    with Barretts Esophagus  . GI bleed 03/26/2017  . GI bleeding    a. required pRBC May, July, and December 2018  . Hyperlipidemia   . Hypertension   . Iron deficiency anemia 2012  . Lumbago   . Macular degeneration   . Osteoporosis   . Shingles    right  back/abdomen 08/16/17   . Sigmoid diverticulosis    by colonoscopy  . Stroke [facility])     Patient Active Problem List   Diagnosis Date Noted  . Unintentional weight loss 09/01/2017  . Chronic pain due to injury 09/01/2017  . Herpes zoster without complication 09/98/3382  . Hiatal hernia   . Diverticulosis of colon without diverticulitis   . GERD (gastroesophageal reflux disease) 11/05/2016  . CAD (coronary artery disease) 08/28/2016  . Non-ST elevation (NSTEMI) myocardial infarction ([place]) 08/24/2016  . Chronic respiratory failure with hypoxia ([place]) 05/17/2016  . Generalized muscle weakness 04/16/2015  . Hospital discharge follow-up 04/16/2015  . Coronary artery disease due to lipid rich plaque 04/14/2015  . Anxiety 04/14/2015  . Bursitis of left hip 11/27/2014  . Iron deficiency anemia 11/25/2014  . History of fracture of pelvis or lower extremity 08/25/2014  . Vascular dementia with depressed mood ([place]) 02/20/2014  . Tobacco abuse counseling 04/10/2013  . Gastric ulcer requiring drug therapy   . Screening for colon cancer 06/18/2011  . COPD (chronic obstructive pulmonary disease) ([place]) 06/18/2011  . Skin cancer of nose 12/17/2010  . Vitamin D deficiency 12/17/2010  . Hyperlipidemia LDL goal <100 12/17/2010  . Screening for malignant neoplasm of breast 12/17/2010  . Encounter for long-term (current) use of other medications 12/17/2010  . S/P abdominal aortic aneurysm repair 12/17/2010  . Diverticulosis, sigmoid 12/08/2010  .  Hyperlipidemia 12/08/2010  . Hypertension 12/08/2010  . Lumbago 12/08/2010  . Osteoporosis 12/08/2010  . Major depressive disorder, recurrent episode, moderate ([place]) 12/08/2010    Past Surgical History:  Procedure Laterality Date  . ABDOMINAL AORTIC ANEURYSM REPAIR  July 2010   [facility]  . APPENDECTOMY    . BACK SURGERY     X2..1975 arachnoid cyst cervical region([place]);1997 lumbosacral tumor,9 hr surgery  . CARDIAC CATHETERIZATION    . CARDIAC  CATHETERIZATION    . cardiac stents    . COLONOSCOPY N/A 03/30/2017   Procedure: COLONOSCOPY;  Surgeon: Lin Landsman, MD;  Location: [facility] ENDOSCOPY;  Service: Gastroenterology;  Laterality: N/A;  . CORONARY ATHERECTOMY N/A 08/31/2016   Procedure: Coronary Atherectomy;  Surgeon: Nelva [place], MD;  Location: [place] CV LAB;  Service: Cardiovascular;  Laterality: N/A;  . ESOPHAGOGASTRODUODENOSCOPY N/A 03/29/2017   Procedure: ESOPHAGOGASTRODUODENOSCOPY (EGD);  Surgeon: Virgel Manifold, MD;  Location: [facility] ENDOSCOPY;  Service: Endoscopy;  Laterality: N/A;  . ESOPHAGOGASTRODUODENOSCOPY (EGD) WITH PROPOFOL N/A 09/02/2016   Procedure: ESOPHAGOGASTRODUODENOSCOPY (EGD) WITH PROPOFOL;  Surgeon: Otis Brace, MD;  Location: [facility] ENDOSCOPY;  Service: Gastroenterology;  Laterality: N/A;  . ESOPHAGOGASTRODUODENOSCOPY (EGD) WITH PROPOFOL N/A 11/06/2016   Procedure: ESOPHAGOGASTRODUODENOSCOPY (EGD) WITH PROPOFOL;  Surgeon: Lucilla Lame, MD;  Location: [facility] ENDOSCOPY;  Service: Endoscopy;  Laterality: N/A;  . FLEXIBLE SIGMOIDOSCOPY N/A 03/29/2017   Procedure: FLEXIBLE SIGMOIDOSCOPY;  Surgeon: Virgel Manifold, MD;  Location: [facility] ENDOSCOPY;  Service: Endoscopy;  Laterality: N/A;  . INTRAVASCULAR ULTRASOUND/IVUS N/A 08/31/2016   Procedure: Intravascular Ultrasound/IVUS;  Surgeon: Nelva [place], MD;  Location: [place] CV LAB;  Service: Cardiovascular;  Laterality: N/A;  . LEFT HEART CATH AND CORONARY ANGIOGRAPHY N/A 08/27/2016   Procedure: Left Heart Cath and Coronary Angiography;  Surgeon: Teodoro Spray, MD;  Location: [place] CV LAB;  Service: Cardiovascular;  Laterality: N/A;    Prior to Admission medications   Medication Sig Start Date End Date Taking? Authorizing Provider  albuterol (PROVENTIL HFA;VENTOLIN HFA) 108 (90 Base) MCG/ACT inhaler Inhale 2 puffs into the lungs every 6 (six) hours as needed for wheezing or shortness of breath. 08/07/18  Yes Crecencio Mc, MD   atorvastatin (LIPITOR) 40 MG tablet TAKE 1 TABLET(40 MG) BY MOUTH DAILY AT 6 PM Patient taking differently: Take 40 mg by mouth daily.  07/21/18  Yes Crecencio Mc, MD  clopidogrel (PLAVIX) 75 MG tablet TAKE 1 TABLET(75 MG) BY MOUTH DAILY Patient taking differently: Take 75 mg by mouth daily.  07/21/18  Yes Crecencio Mc, MD  diazepam (VALIUM) 5 MG tablet Take 1 tablet (5 mg total) by mouth 2 (two) times daily as needed for anxiety. 05/02/18  Yes Crecencio Mc, MD  ergocalciferol (DRISDOL) 1.25 MG (50000 UT) capsule Take 1 capsule (50,000 Units total) by mouth once a week. 07/22/18  Yes Crecencio Mc, MD  ferrous sulfate 325 (65 FE) MG tablet Take 1 tablet (325 mg total) by mouth daily with breakfast. 09/03/16  Yes Lendon Colonel, NP  HYDROcodone-acetaminophen (NORCO/VICODIN) 5-325 MG tablet Take 1 tablet by mouth every 6 (six) hours as needed for moderate pain. Maximum 2 daily. 08/22/18  Yes Crecencio Mc, MD  megestrol (MEGACE) 40 MG tablet TAKE 1 TABLET(40 MG) BY MOUTH DAILY Patient taking differently: Take 40 mg by mouth daily.  02/06/17  Yes Crecencio Mc, MD  mirtazapine (REMERON) 45 MG tablet TAKE 1 TABLET BY MOUTH EVERY DAY AT BEDTIME Patient taking differently: Take 45 mg by mouth at  bedtime.  07/04/18  Yes Crecencio Mc, MD  nitroGLYCERIN (NITROSTAT) 0.4 MG SL tablet Place 1 tablet (0.4 mg total) under the tongue every 5 (five) minutes x 3 doses as needed for chest pain. 09/28/16  Yes Crecencio Mc, MD  pantoprazole (PROTONIX) 40 MG tablet TAKE 1 TABLET(40 MG) BY MOUTH DAILY Patient taking differently: Take 40 mg by mouth daily.  05/02/18  Yes Crecencio Mc, MD  PARoxetine (PAXIL) 30 MG tablet TAKE 1 TABLET BY MOUTH DAILY Patient taking differently: Take 30 mg by mouth daily.  07/21/18  Yes Crecencio Mc, MD  SYMBICORT 80-4.5 MCG/ACT inhaler Inhale 2 puffs into the lungs 2 (two) times daily. 08/19/18  Yes Crecencio Mc, MD  HYDROcodone-acetaminophen (NORCO/VICODIN) 5-325 MG  tablet Take 1 tablet by mouth every 6 (six) hours as needed. Maximum 2 daily 09/21/18   Crecencio Mc, MD  valACYclovir (VALTREX) 1000 MG tablet Take 1 tablet (1,000 mg total) by mouth 2 (two) times daily. Patient not taking: Reported on 07/25/2018 08/16/17   McLean-Scocuzza, Nino Glow, MD     Allergies Morphine sulfate  Family History  Problem Relation Age of Onset  . Coronary artery disease Mother   . Alzheimer's disease Mother   . Coronary artery disease Father 66  . Heart attack Father   . Throat cancer Brother   . Lung cancer Maternal Aunt   . Kidney cancer Daughter   . Cervical cancer Daughter     Social History Social History   Tobacco Use  . Smoking status: Current Every Day Smoker    Packs/day: 0.50    Types: Cigarettes  . Smokeless tobacco: Never Used  . Tobacco comment: HAS BEEN SMOKING 30+ YEARS,RESUMED TOBACCO USE AFTER AAA REPAIR  Substance Use Topics  . Alcohol use: No  . Drug use: No    Review of Systems  Constitutional: As above Eyes: No visual changes.  ENT: No sore throat. Cardiovascular: Denies chest pain. Respiratory: Denies shortness of breath. Gastrointestinal: As above Genitourinary: Negative for dysuria. Musculoskeletal: No myalgias Skin: Negative for rash. Neurological: Negative for headaches   ____________________________________________   PHYSICAL EXAM:  VITAL SIGNS: ED Triage Vitals  Enc Vitals Group     BP 08/21/18 1212 (!) 154/63     Pulse Rate 08/21/18 1212 71     Resp --      Temp 08/21/18 1212 99 F (37.2 C)     Temp Source 08/21/18 1212 Oral     SpO2 08/21/18 1210 97 %     Weight 08/21/18 1213 51.7 kg (114 lb)     Height 08/21/18 1213 1.651 m (5\' 5" )     Head Circumference --      Peak Flow --      Pain Score 08/21/18 1213 0     Pain Loc --      Pain Edu? --      Excl. in [place]? --     Constitutional: Alert and oriented. Eyes: Conjunctivae are normal.   Nose: No congestion/rhinnorhea. Mouth/Throat: Mucous  membranes are dry  Cardiovascular: Normal rate, regular rhythm. Grossly normal heart sounds.  Good peripheral circulation. Respiratory: Normal respiratory effort.  No retractions. Lungs CTAB. Gastrointestinal: Soft and nontender. No distention.  No CVA tenderness.  Reassuring exam  Musculoskeletal:  Warm and well perfused Neurologic:  Normal speech and language. No gross focal neurologic deficits are appreciated.  Skin:  Skin is warm, dry and intact. No rash noted. Psychiatric: Mood and affect are normal. Speech  and behavior are normal.  ____________________________________________   LABS (all labs ordered are listed, but only abnormal results are displayed)  Labs Reviewed  CBC WITH DIFFERENTIAL/PLATELET - Abnormal; Notable for the following components:      Result Value   RBC 3.19 (*)    Hemoglobin 9.7 (*)    HCT 30.6 (*)    Neutro Abs 8.4 (*)    All other components within normal limits  COMPREHENSIVE METABOLIC PANEL - Abnormal; Notable for the following components:   Glucose, Bld 113 (*)    BUN 25 (*)    Creatinine, Ser 1.14 (*)    Calcium 8.5 (*)    Total Protein 6.4 (*)    Albumin 3.0 (*)    GFR calc non Af Amer 44 (*)    GFR calc Af Amer 51 (*)    All other components within normal limits  LIPASE, BLOOD   ____________________________________________  EKG  None ____________________________________________  RADIOLOGY   ____________________________________________   PROCEDURES  Procedure(s) performed: No  Procedures   Critical Care performed: No ____________________________________________   INITIAL IMPRESSION / ASSESSMENT AND PLAN / ED COURSE  Pertinent labs & imaging results that were available during my care of the patient were reviewed by me and considered in my medical decision making (see chart for details).  Patient presents with primary complaint of diarrhea also with one episode of vomiting.  Abdominal exam is overall reassuring.   Differential includes colitis, gastroenteritis.  We will check labs, give IV fluids, IV Zofran and reevaluate  Lab work is consistent with mild dehydration with elevated BUN/creatinine ratio. CBC is overall reassuring  Patient is feeling better after IV fluids, suspect viral gastroenteritis.  Abdominal exam is reassuring.  Appropriate for discharge at this time with return precautions.   ____________________________________________   FINAL CLINICAL IMPRESSION(S) / ED DIAGNOSES  Final diagnoses:  Gastroenteritis        Note:  This document was prepared using Dragon voice recognition software and may include unintentional dictation errors.   Lavonia Drafts, MD 08/21/18 1505

## 2018-08-21 NOTE — ED Triage Notes (Signed)
Pt arrives via ems from home. Pt c/o diarrhea x 3 day, emesis starting yesterday. Unable to keep anything down. On arrival pt a&o x 4, NAD noted at this time

## 2018-08-21 NOTE — Telephone Encounter (Signed)
Pt was sent to ER, provider is aware.  Anna Prince,cma

## 2018-08-22 DIAGNOSIS — J9611 Chronic respiratory failure with hypoxia: Secondary | ICD-10-CM | POA: Diagnosis not present

## 2018-08-22 DIAGNOSIS — J449 Chronic obstructive pulmonary disease, unspecified: Secondary | ICD-10-CM | POA: Diagnosis not present

## 2018-09-05 NOTE — Telephone Encounter (Signed)
error 

## 2018-09-22 DIAGNOSIS — J9611 Chronic respiratory failure with hypoxia: Secondary | ICD-10-CM | POA: Diagnosis not present

## 2018-09-22 DIAGNOSIS — J449 Chronic obstructive pulmonary disease, unspecified: Secondary | ICD-10-CM | POA: Diagnosis not present

## 2018-10-07 ENCOUNTER — Other Ambulatory Visit: Payer: Self-pay

## 2018-10-07 MED ORDER — ALBUTEROL SULFATE HFA 108 (90 BASE) MCG/ACT IN AERS: 2.0000 | INHALATION_SPRAY | Freq: Four times a day (QID) | RESPIRATORY_TRACT | 2 refills | Status: DC | PRN

## 2018-10-22 DIAGNOSIS — J449 Chronic obstructive pulmonary disease, unspecified: Secondary | ICD-10-CM | POA: Diagnosis not present

## 2018-10-22 DIAGNOSIS — J9611 Chronic respiratory failure with hypoxia: Secondary | ICD-10-CM | POA: Diagnosis not present

## 2018-10-24 ENCOUNTER — Telehealth: Payer: Self-pay | Admitting: Internal Medicine

## 2018-10-24 NOTE — Telephone Encounter (Signed)
Copied from [place] [phone]. Topic: Quick Communication - Rx Refill/Question >> Oct 24, 2018 11:17 AM Leward Quan A wrote: Medication: HYDROcodone-acetaminophen (NORCO/VICODIN) 5-325 MG tablet   Has the patient contacted their pharmacy? Yes.   (Agent: If no, request that the patient contact the pharmacy for the refill.) (Agent: If yes, when and what did the pharmacy advise?)  Preferred Pharmacy (with phone number or street name): [id number] [address], [place] - [phone] (Phone) [phone] (Fax)    Agent: Please be advised that RX refills may take up to 3 business days. We ask that you follow-up with your pharmacy.

## 2018-10-27 MED ORDER — HYDROCODONE-ACETAMINOPHEN 5-325 MG PO TABS: 1.0000 | ORAL_TABLET | Freq: Four times a day (QID) | ORAL | 0 refills | Status: DC | PRN

## 2018-10-27 NOTE — Telephone Encounter (Signed)
Refilled for 30 days only.  OFFICE VISIT NEEDED prior to any more refills 

## 2018-10-27 NOTE — Telephone Encounter (Signed)
Left message to return call to office [facility] nurse please advise and send to scheduling.

## 2018-10-27 NOTE — Telephone Encounter (Signed)
Last OV 08/07/18 last fill 09/10/18 OK to fill?

## 2018-11-21 ENCOUNTER — Other Ambulatory Visit: Payer: Self-pay

## 2018-11-21 MED ORDER — SYMBICORT 80-4.5 MCG/ACT IN AERO: 2.0000 | INHALATION_SPRAY | Freq: Two times a day (BID) | RESPIRATORY_TRACT | 2 refills | Status: AC

## 2018-11-21 NOTE — Telephone Encounter (Signed)
Refilled: 05/02/2018 Last OV: 08/07/2018 Next OV: not scheduled

## 2018-11-22 DIAGNOSIS — J449 Chronic obstructive pulmonary disease, unspecified: Secondary | ICD-10-CM | POA: Diagnosis not present

## 2018-11-22 DIAGNOSIS — J9611 Chronic respiratory failure with hypoxia: Secondary | ICD-10-CM | POA: Diagnosis not present

## 2018-11-25 MED ORDER — DIAZEPAM 5 MG PO TABS: 5.0000 mg | ORAL_TABLET | Freq: Two times a day (BID) | ORAL | 0 refills | Status: DC | PRN

## 2018-11-25 NOTE — Telephone Encounter (Signed)
Refill for 30 days only.  OFFICE VISIT NEEDED prior to any more refills 

## 2018-12-11 DIAGNOSIS — H353134 Nonexudative age-related macular degeneration, bilateral, advanced atrophic with subfoveal involvement: Secondary | ICD-10-CM | POA: Diagnosis not present

## 2018-12-12 ENCOUNTER — Other Ambulatory Visit: Payer: Self-pay

## 2018-12-12 ENCOUNTER — Ambulatory Visit (INDEPENDENT_AMBULATORY_CARE_PROVIDER_SITE_OTHER): Payer: Medicare Other | Admitting: Internal Medicine

## 2018-12-12 ENCOUNTER — Encounter: Payer: Self-pay | Admitting: Internal Medicine

## 2018-12-12 VITALS — Ht 65.0 in | Wt 114.0 lb

## 2018-12-12 DIAGNOSIS — D649 Anemia, unspecified: Secondary | ICD-10-CM | POA: Diagnosis not present

## 2018-12-12 DIAGNOSIS — M545 Low back pain: Secondary | ICD-10-CM

## 2018-12-12 DIAGNOSIS — K582 Mixed irritable bowel syndrome: Secondary | ICD-10-CM

## 2018-12-12 DIAGNOSIS — R197 Diarrhea, unspecified: Secondary | ICD-10-CM | POA: Diagnosis not present

## 2018-12-12 DIAGNOSIS — R634 Abnormal weight loss: Secondary | ICD-10-CM

## 2018-12-12 DIAGNOSIS — D500 Iron deficiency anemia secondary to blood loss (chronic): Secondary | ICD-10-CM | POA: Diagnosis not present

## 2018-12-12 DIAGNOSIS — F331 Major depressive disorder, recurrent, moderate: Secondary | ICD-10-CM

## 2018-12-12 DIAGNOSIS — G8929 Other chronic pain: Secondary | ICD-10-CM

## 2018-12-12 MED ORDER — HYDROCODONE-ACETAMINOPHEN 5-325 MG PO TABS: 1.0000 | ORAL_TABLET | Freq: Two times a day (BID) | ORAL | 0 refills | Status: DC | PRN

## 2018-12-12 MED ORDER — MEGESTROL ACETATE 40 MG PO TABS: 40.0000 mg | ORAL_TABLET | Freq: Every day | ORAL | 2 refills | Status: DC

## 2018-12-12 NOTE — Assessment & Plan Note (Addendum)
AMOUNT UNCLEAR, PATIENT DOES NOT WEIGH.  NEEDS RN VISIT TO ASSESS.  Trial of megace .  remeron already at maximal dose.

## 2018-12-12 NOTE — Assessment & Plan Note (Addendum)
WORSENING BY [facility]  Etiology likely multifactorial:  CKD  ,  IRON DEFICIENCY.  Repeat labs ordered .  If iron deficient and FOBT positive will require GI referral

## 2018-12-12 NOTE — Progress Notes (Signed)
Virtual Visit via [email or web address]  This visit type was conducted due to national recommendations for restrictions regarding the COVID-19 pandemic (e.g. social distancing).  This format is felt to be most appropriate for this patient at this time.  All issues noted in this document were discussed and addressed.  No physical exam was performed (except for noted visual exam findings with Video Visits).   I connected with@ on 12/12/18 at 11:30 AM EDT by a video enabled telemedicine application or telephone and verified that I am speaking with the correct person using two identifiers. Location patient: home Location provider: work or home office Persons participating in the virtual visit: patient, provider  I discussed the limitations, risks, security and privacy concerns of performing an evaluation and management service by telephone and the availability of in person appointments. I also discussed with the patient that there may be a patient responsible charge related to this service. The patient expressed understanding and agreed to proceed.   Reason for visit: follow up on multiple issues  HPI:  1) patient states that she is having diarrhea.  Described as loose stools, Not occurring daily.  Has some days with formed stools or no stools at all .    No melena or hematochezia. Occasional  Nocturnal  episodes.  Avoids dairy but not completely.  No fevers ,  Some occasional cramping relieved with stooling.. Improves with Imodium,  continues to have anorexia despite maximal dose of remeron.  Losing weight but cannot quantify. History of gastric ulcer and GI bleed  Requiring transfusions in 2018.  ER visit in [place] for N/V/D  requirng iv saline     Last colonoscopy was done for evaluation of hematochezia   2018, and there was no finding of  polyps or masses  One angioectasia was found and cauterized.   2) COPD:  Still smoking  Despite use of supplemental oxygen . Activity limited by dyspnea  And hip pain.    3) chronic pain:  Using vicodin  As directed .  Refill history confirmed via [place] Controlled Substance databas, accessed by me today.Marland Kitchen  4) Anemia:  hgb was noted to drop during ER visit for dehydration   Patient denies headache, fevers, malaise,, skin rash, eye pain, sinus congestion and sinus pain, sore throat, dysphagia,  hemoptysis , cough, dyspnea, wheezing, chest pain, palpitations, orthopnea, edema, , nausea, melena, constipation, flank pain, dysuria, hematuria, urinary  Frequency, nocturia, numbness, tingling, seizures,  Focal weakness, Loss of consciousness,  Tremor, insomnia, depression, anxiety, and suicidal ideation.      Past Medical History:  Diagnosis Date  . AAA (abdominal aortic aneurysm) ([place])   . Anxiety   . Aortic aneurysm ([place])   . Arthritis   . Barrett's esophagus 2007  . Bursitis 2012   left hip, improved with periodic steroid injection [facility])  . COPD (chronic obstructive pulmonary disease) ([place])   . Coronary artery disease    a. LHC (08/27/16): 15% ostial LM, 99% pLAD and D1, Ramus & LCx nl, m/dRCA diffuse 80-90%, PDA occluded proximally, fills via L-R collats, EF 50-55%; b. PCI (08/31/16): Severe pLAD dz involving D1, successful orbital atherectomy PCI to pLAD w/ Synergy 3.0 x 20 mm DES, 0% residual stenosis & TIMI 3 flow, occluded ostD1, medically managed given lack of CP  . Depression   . Diastolic dysfunction    a. TTE (08/25/16): Mild LVH with LVEF of 50-55% and grade 1 diastolic dysfunction. Mild MR. Mild right atrial enlargement. Normal RV size  and function  . GERD (gastroesophageal reflux disease)    with Barretts Esophagus  . GI bleed 03/26/2017  . GI bleeding    a. required pRBC May, July, and December 2018  . Hyperlipidemia   . Hypertension   . Iron deficiency anemia 2012  . Lumbago   . Macular degeneration   . Osteoporosis   . Shingles    right back/abdomen 08/16/17   . Sigmoid diverticulosis    by colonoscopy  . Stroke [facility])     Past  Surgical History:  Procedure Laterality Date  . ABDOMINAL AORTIC ANEURYSM REPAIR  July 2010   [facility]  . APPENDECTOMY    . BACK SURGERY     X2..1975 arachnoid cyst cervical region([place]);1997 lumbosacral tumor,9 hr surgery  . CARDIAC CATHETERIZATION    . CARDIAC CATHETERIZATION    . cardiac stents    . COLONOSCOPY N/A 03/30/2017   Procedure: COLONOSCOPY;  Surgeon: Lin Landsman, MD;  Location: [facility] ENDOSCOPY;  Service: Gastroenterology;  Laterality: N/A;  . CORONARY ATHERECTOMY N/A 08/31/2016   Procedure: Coronary Atherectomy;  Surgeon: Nelva Bush, MD;  Location: [place] CV LAB;  Service: Cardiovascular;  Laterality: N/A;  . ESOPHAGOGASTRODUODENOSCOPY N/A 03/29/2017   Procedure: ESOPHAGOGASTRODUODENOSCOPY (EGD);  Surgeon: Virgel Manifold, MD;  Location: [facility] ENDOSCOPY;  Service: Endoscopy;  Laterality: N/A;  . ESOPHAGOGASTRODUODENOSCOPY (EGD) WITH PROPOFOL N/A 09/02/2016   Procedure: ESOPHAGOGASTRODUODENOSCOPY (EGD) WITH PROPOFOL;  Surgeon: Otis Brace, MD;  Location: [facility] ENDOSCOPY;  Service: Gastroenterology;  Laterality: N/A;  . ESOPHAGOGASTRODUODENOSCOPY (EGD) WITH PROPOFOL N/A 11/06/2016   Procedure: ESOPHAGOGASTRODUODENOSCOPY (EGD) WITH PROPOFOL;  Surgeon: Lucilla Lame, MD;  Location: [facility] ENDOSCOPY;  Service: Endoscopy;  Laterality: N/A;  . FLEXIBLE SIGMOIDOSCOPY N/A 03/29/2017   Procedure: FLEXIBLE SIGMOIDOSCOPY;  Surgeon: Virgel Manifold, MD;  Location: [facility] ENDOSCOPY;  Service: Endoscopy;  Laterality: N/A;  . INTRAVASCULAR ULTRASOUND/IVUS N/A 08/31/2016   Procedure: Intravascular Ultrasound/IVUS;  Surgeon: Nelva Bush, MD;  Location: [place] CV LAB;  Service: Cardiovascular;  Laterality: N/A;  . LEFT HEART CATH AND CORONARY ANGIOGRAPHY N/A 08/27/2016   Procedure: Left Heart Cath and Coronary Angiography;  Surgeon: Teodoro Spray, MD;  Location: [place] CV LAB;  Service: Cardiovascular;  Laterality: N/A;    Family History  Problem  Relation Age of Onset  . Coronary artery disease Mother   . Alzheimer's disease Mother   . Coronary artery disease Father 31  . Heart attack Father   . Throat cancer Brother   . Lung cancer Maternal Aunt   . Kidney cancer Daughter   . Cervical cancer Daughter     SOCIAL HX:  reports that she has been smoking cigarettes. She has been smoking about 0.50 packs per day. She has never used smokeless tobacco. She reports that she does not drink alcohol or use drugs.   Current Outpatient Medications:  .  albuterol (VENTOLIN HFA) 108 (90 Base) MCG/ACT inhaler, Inhale 2 puffs into the lungs every 6 (six) hours as needed for wheezing or shortness of breath., Disp: 1 Inhaler, Rfl: 2 .  atorvastatin (LIPITOR) 40 MG tablet, TAKE 1 TABLET(40 MG) BY MOUTH DAILY AT 6 PM (Patient taking differently: Take 40 mg by mouth daily. ), Disp: 90 tablet, Rfl: 1 .  clopidogrel (PLAVIX) 75 MG tablet, TAKE 1 TABLET(75 MG) BY MOUTH DAILY (Patient taking differently: Take 75 mg by mouth daily. ), Disp: 90 tablet, Rfl: 1 .  diazepam (VALIUM) 5 MG tablet, Take 1 tablet (5 mg total) by mouth 2 (two) times daily  as needed for anxiety., Disp: 60 tablet, Rfl: 0 .  ergocalciferol (DRISDOL) 1.25 MG (50000 UT) capsule, Take 1 capsule (50,000 Units total) by mouth once a week., Disp: 12 capsule, Rfl: 3 .  ferrous sulfate 325 (65 FE) MG tablet, Take 1 tablet (325 mg total) by mouth daily with breakfast., Disp: 30 tablet, Rfl: 3 .  HYDROcodone-acetaminophen (NORCO/VICODIN) 5-325 MG tablet, Take 1 tablet by mouth 2 (two) times daily as needed for moderate pain. Maximum 2 daily., Disp: 60 tablet, Rfl: 0 .  [START ON 01/11/2019] HYDROcodone-acetaminophen (NORCO/VICODIN) 5-325 MG tablet, Take 1 tablet by mouth 2 (two) times daily as needed. Maximum 2 daily, Disp: 60 tablet, Rfl: 0 .  [START ON 02/10/2019] HYDROcodone-acetaminophen (NORCO/VICODIN) 5-325 MG tablet, Take 1 tablet by mouth 2 (two) times daily as needed for moderate pain.  Maximum 2 daily., Disp: 60 tablet, Rfl: 0 .  megestrol (MEGACE) 40 MG tablet, Take 1 tablet (40 mg total) by mouth daily., Disp: 30 tablet, Rfl: 2 .  mirtazapine (REMERON) 45 MG tablet, TAKE 1 TABLET BY MOUTH EVERY DAY AT BEDTIME (Patient taking differently: Take 45 mg by mouth at bedtime. ), Disp: 90 tablet, Rfl: 1 .  nitroGLYCERIN (NITROSTAT) 0.4 MG SL tablet, Place 1 tablet (0.4 mg total) under the tongue every 5 (five) minutes x 3 doses as needed for chest pain., Disp: 90 tablet, Rfl: 1 .  PARoxetine (PAXIL) 30 MG tablet, TAKE 1 TABLET BY MOUTH DAILY (Patient taking differently: Take 30 mg by mouth daily. ), Disp: 90 tablet, Rfl: 1 .  SYMBICORT 80-4.5 MCG/ACT inhaler, Inhale 2 puffs into the lungs 2 (two) times daily., Disp: 10.2 g, Rfl: 2  EXAM:  VITALS per patient if applicable:  GENERAL: alert, oriented, appears well and in no acute distress  HEENT: atraumatic, conjunttiva clear, no obvious abnormalities on inspection of external nose and ears  NECK: normal movements of the head and neck  LUNGS: on inspection no signs of respiratory distress, breathing rate appears normal, no obvious gross SOB, gasping or wheezing  CV: no obvious cyanosis  MS: moves all visible extremities without noticeable abnormality  PSYCH/NEURO: pleasant and cooperative, no obvious depression or anxiety, speech and thought processing grossly intact  ASSESSMENT AND PLAN:  Abnormal weight loss AMOUNT UNCLEAR, PATIENT DOES NOT WEIGH.  NEEDS RN VISIT TO ASSESS.  Trial of megace .  remeron already at maximal dose.   Anemia WORSENING BY [facility] LAB[facility]  Etiology likely multifactorial:  CKD  ,  IRON DEFICIENCY.  Repeat labs ordered .  If iron deficient and FOBT positive will require GI referral   IBS (irritable bowel syndrome) She has days of loose stools 1 or 2 daily, relieved with Imodium,  Followed by days without a stool.she has unintentional weight loss due to decreased  Appetite complicated by mild cognitive  deficits and chronic pain.   Her last colonoscopy was normal in 2018.  Recommending trial of dairy free diet and continued prn use of Imodium.    Diarrhea Will check stool for viral , bacterial pathogens but infection is unlkikely given mild symptoms.   Major depressive disorder, recurrent episode, moderate (HCC) Mood is stable and improved with paxil and remeron .  No changes today  Lumbago She has chronic pain from osteoporotic fractures and DJD hips.  She is not a candidate for NSAIDs due to history of NSAID induced ulcer and CKD.Marland KitchenRefill history confirmed via [place] Controlled Substance databas, accessed by me today..refill of Vicodin for use bid to be given  for 3 months   Lab Results  Component Value Date   CREATININE 1.14 (H) 08/21/2018       I discussed the assessment and treatment plan with the patient. The patient was provided an opportunity to ask questions and all were answered. The patient agreed with the plan and demonstrated an understanding of the instructions.   The patient was advised to call back or seek an in-person evaluation if the symptoms worsen or if the condition fails to improve as anticipated.  I provided 25 minutes of non-face-to-face time during this encounter.   Crecencio Mc, MD

## 2018-12-12 NOTE — Telephone Encounter (Signed)
Pt has been scheduled for a virtual visit at 11:30 today.

## 2018-12-14 DIAGNOSIS — K589 Irritable bowel syndrome without diarrhea: Secondary | ICD-10-CM | POA: Insufficient documentation

## 2018-12-14 NOTE — Assessment & Plan Note (Signed)
Will check stool for viral , bacterial pathogens but infection is unlkikely given mild symptoms.

## 2018-12-14 NOTE — Assessment & Plan Note (Signed)
She has days of loose stools 1 or 2 daily, relieved with Imodium,  Followed by days without a stool.she has unintentional weight loss due to decreased  Appetite complicated by mild cognitive deficits and chronic pain.   Her last colonoscopy was normal in 2018.  Recommending trial of dairy free diet and continued prn use of Imodium.

## 2018-12-14 NOTE — Assessment & Plan Note (Signed)
Mood is stable and improved with paxil and remeron .  No changes today

## 2018-12-14 NOTE — Assessment & Plan Note (Signed)
She has chronic pain from osteoporotic fractures and DJD hips.  She is not a candidate for NSAIDs due to history of NSAID induced ulcer and CKD.Marland KitchenRefill history confirmed via [place] Controlled Substance databas, accessed by me today..refill of Vicodin for use bid to be given for 3 months   Lab Results  Component Value Date   CREATININE 1.14 (H) 08/21/2018

## 2018-12-23 ENCOUNTER — Telehealth: Payer: Self-pay

## 2018-12-23 DIAGNOSIS — J449 Chronic obstructive pulmonary disease, unspecified: Secondary | ICD-10-CM | POA: Diagnosis not present

## 2018-12-23 DIAGNOSIS — J9611 Chronic respiratory failure with hypoxia: Secondary | ICD-10-CM | POA: Diagnosis not present

## 2018-12-23 NOTE — Telephone Encounter (Signed)
Copied from [place] [phone]. Topic: General - Other >> Dec 23, 2018  1:17 PM [name], [name] wrote: Reason for CRM: Pt requests to have flu vaccine while in for lab visit on 12/30/18.

## 2018-12-30 ENCOUNTER — Other Ambulatory Visit: Payer: Self-pay

## 2018-12-30 ENCOUNTER — Encounter: Payer: Self-pay | Admitting: Internal Medicine

## 2018-12-30 ENCOUNTER — Other Ambulatory Visit: Payer: Self-pay | Admitting: Internal Medicine

## 2018-12-30 ENCOUNTER — Other Ambulatory Visit (INDEPENDENT_AMBULATORY_CARE_PROVIDER_SITE_OTHER): Payer: Medicare Other

## 2018-12-30 DIAGNOSIS — R197 Diarrhea, unspecified: Secondary | ICD-10-CM | POA: Diagnosis not present

## 2018-12-30 DIAGNOSIS — Z23 Encounter for immunization: Secondary | ICD-10-CM | POA: Diagnosis not present

## 2018-12-30 DIAGNOSIS — D500 Iron deficiency anemia secondary to blood loss (chronic): Secondary | ICD-10-CM

## 2018-12-30 DIAGNOSIS — D649 Anemia, unspecified: Secondary | ICD-10-CM | POA: Diagnosis not present

## 2018-12-30 DIAGNOSIS — E538 Deficiency of other specified B group vitamins: Secondary | ICD-10-CM

## 2018-12-30 LAB — COMPREHENSIVE METABOLIC PANEL
ALT: 12 U/L (ref 0–35)
AST: 15 U/L (ref 0–37)
Albumin: 3.7 g/dL (ref 3.5–5.2)
Alkaline Phosphatase: 86 U/L (ref 39–117)
BUN: 30 mg/dL — ABNORMAL HIGH (ref 6–23)
CO2: 25 mEq/L (ref 19–32)
Calcium: 9 mg/dL (ref 8.4–10.5)
Chloride: 104 mEq/L (ref 96–112)
Creatinine, Ser: 1.11 mg/dL (ref 0.40–1.20)
GFR: 46.73 mL/min — ABNORMAL LOW (ref >60.00)
Glucose, Bld: 95 mg/dL (ref 70–99)
Potassium: 4.3 mEq/L (ref 3.5–5.1)
Sodium: 138 mEq/L (ref 135–145)
Total Bilirubin: 0.3 mg/dL (ref 0.2–1.2)
Total Protein: 6.7 g/dL (ref 6.0–8.3)

## 2018-12-30 LAB — CBC WITH DIFFERENTIAL/PLATELET
Basophils Absolute: 0 10*3/uL (ref 0.0–0.1)
Basophils Relative: 0.5 % (ref 0.0–3.0)
Eosinophils Absolute: 0.2 10*3/uL (ref 0.0–0.7)
Eosinophils Relative: 2 % (ref 0.0–5.0)
HCT: 33.7 % — ABNORMAL LOW (ref 36.0–46.0)
Hemoglobin: 11.1 g/dL — ABNORMAL LOW (ref 12.0–15.0)
Lymphocytes Relative: 11.4 % — ABNORMAL LOW (ref 12.0–46.0)
Lymphs Abs: 1 10*3/uL (ref 0.7–4.0)
MCHC: 33 g/dL (ref 30.0–36.0)
MCV: 92.6 fl (ref 78.0–100.0)
Monocytes Absolute: 0.4 10*3/uL (ref 0.1–1.0)
Monocytes Relative: 4.6 % (ref 3.0–12.0)
Neutro Abs: 6.9 10*3/uL (ref 1.4–7.7)
Neutrophils Relative %: 81.5 % — ABNORMAL HIGH (ref 43.0–77.0)
Platelets: 199 10*3/uL (ref 150.0–400.0)
RBC: 3.64 Mil/uL — ABNORMAL LOW (ref 3.87–5.11)
RDW: 13.9 % (ref 11.5–15.5)
WBC: 8.4 10*3/uL (ref 4.0–10.5)

## 2018-12-30 LAB — IBC + FERRITIN
Ferritin: 34.1 ng/mL (ref 10.0–291.0)
Iron: 44 ug/dL (ref 42–145)
Saturation Ratios: 14.3 % — ABNORMAL LOW (ref 20.0–50.0)
Transferrin: 220 mg/dL (ref 212.0–360.0)

## 2018-12-30 LAB — VITAMIN B12: Vitamin B-12: 181 pg/mL — ABNORMAL LOW (ref 211–911)

## 2019-01-06 ENCOUNTER — Other Ambulatory Visit: Payer: Self-pay | Admitting: Internal Medicine

## 2019-01-06 ENCOUNTER — Telehealth: Payer: Self-pay

## 2019-01-06 MED ORDER — "SYRINGE 25G X 1"" 3 ML MISC": 0 refills | Status: DC

## 2019-01-06 MED ORDER — CYANOCOBALAMIN 1000 MCG/ML IJ SOLN: INTRAMUSCULAR | 0 refills | Status: AC

## 2019-01-06 NOTE — Telephone Encounter (Signed)
Copied from [place] [id number]. Topic: General - Other >> Jan 06, 2019  8:51 AM Pauline Good wrote: Reason for CRM [name] is calling and need to speak to [facility] concerning her mom's new prescription.

## 2019-01-06 NOTE — Telephone Encounter (Signed)
Pharmacy calling to ask how much is the pt supposed to inject of the  cyanocobalamin (,VITAMIN B-12,) 1000 MCG/ML injection They need clarification on

## 2019-01-07 ENCOUNTER — Telehealth: Payer: Self-pay | Admitting: Internal Medicine

## 2019-01-07 NOTE — Telephone Encounter (Signed)
See previous message. Clarification has been given.

## 2019-01-07 NOTE — Telephone Encounter (Signed)
Spoke with pharmacy to let them know that it should be 51ml for 3 days then 69ml for 4 weeks and then 36ml once monthly. Pharmacy gave a verbal understanding.

## 2019-01-07 NOTE — Telephone Encounter (Signed)
Pharmacy called and is requesting clarification on patients Vitamin B12. Please advise.    [facility] [id number], [address] AT [facility] OF [address]  [place] [address]  Phone: [phone] Fax: [phone]  Not a 24 hour pharmacy; exact hours not known.

## 2019-01-07 NOTE — Telephone Encounter (Signed)
Pharmacy called and is requesting clarification on patients Vitamin B12. Please advise.    [facility] [place], [address] AT [facility] OF [address]  [place] [address]  Phone: [phone] Fax: [phone]

## 2019-01-09 DIAGNOSIS — Z885 Allergy status to narcotic agent status: Secondary | ICD-10-CM | POA: Diagnosis not present

## 2019-01-09 DIAGNOSIS — R339 Retention of urine, unspecified: Secondary | ICD-10-CM | POA: Diagnosis not present

## 2019-01-09 DIAGNOSIS — K259 Gastric ulcer, unspecified as acute or chronic, without hemorrhage or perforation: Secondary | ICD-10-CM | POA: Diagnosis not present

## 2019-01-09 DIAGNOSIS — K449 Diaphragmatic hernia without obstruction or gangrene: Secondary | ICD-10-CM | POA: Diagnosis not present

## 2019-01-09 DIAGNOSIS — M1990 Unspecified osteoarthritis, unspecified site: Secondary | ICD-10-CM | POA: Diagnosis not present

## 2019-01-09 DIAGNOSIS — D72829 Elevated white blood cell count, unspecified: Secondary | ICD-10-CM | POA: Diagnosis not present

## 2019-01-09 DIAGNOSIS — Z66 Do not resuscitate: Secondary | ICD-10-CM | POA: Diagnosis not present

## 2019-01-09 DIAGNOSIS — R195 Other fecal abnormalities: Secondary | ICD-10-CM | POA: Diagnosis not present

## 2019-01-09 DIAGNOSIS — A09 Infectious gastroenteritis and colitis, unspecified: Secondary | ICD-10-CM | POA: Diagnosis not present

## 2019-01-09 DIAGNOSIS — R531 Weakness: Secondary | ICD-10-CM | POA: Diagnosis not present

## 2019-01-09 DIAGNOSIS — R51 Headache: Secondary | ICD-10-CM | POA: Diagnosis not present

## 2019-01-09 DIAGNOSIS — K529 Noninfective gastroenteritis and colitis, unspecified: Secondary | ICD-10-CM | POA: Diagnosis not present

## 2019-01-09 DIAGNOSIS — I517 Cardiomegaly: Secondary | ICD-10-CM | POA: Diagnosis not present

## 2019-01-09 DIAGNOSIS — R10814 Left lower quadrant abdominal tenderness: Secondary | ICD-10-CM | POA: Diagnosis not present

## 2019-01-09 DIAGNOSIS — R110 Nausea: Secondary | ICD-10-CM | POA: Diagnosis not present

## 2019-01-09 DIAGNOSIS — K5792 Diverticulitis of intestine, part unspecified, without perforation or abscess without bleeding: Secondary | ICD-10-CM | POA: Diagnosis not present

## 2019-01-09 DIAGNOSIS — J449 Chronic obstructive pulmonary disease, unspecified: Secondary | ICD-10-CM | POA: Diagnosis not present

## 2019-01-09 DIAGNOSIS — I2510 Atherosclerotic heart disease of native coronary artery without angina pectoris: Secondary | ICD-10-CM | POA: Diagnosis not present

## 2019-01-09 DIAGNOSIS — D649 Anemia, unspecified: Secondary | ICD-10-CM | POA: Diagnosis not present

## 2019-01-10 DIAGNOSIS — D72829 Elevated white blood cell count, unspecified: Secondary | ICD-10-CM | POA: Diagnosis not present

## 2019-01-10 DIAGNOSIS — R197 Diarrhea, unspecified: Secondary | ICD-10-CM | POA: Diagnosis not present

## 2019-01-10 DIAGNOSIS — R338 Other retention of urine: Secondary | ICD-10-CM | POA: Diagnosis not present

## 2019-01-10 DIAGNOSIS — R296 Repeated falls: Secondary | ICD-10-CM | POA: Diagnosis not present

## 2019-01-11 DIAGNOSIS — R338 Other retention of urine: Secondary | ICD-10-CM | POA: Diagnosis not present

## 2019-01-11 DIAGNOSIS — D649 Anemia, unspecified: Secondary | ICD-10-CM | POA: Diagnosis not present

## 2019-01-11 DIAGNOSIS — D72829 Elevated white blood cell count, unspecified: Secondary | ICD-10-CM | POA: Diagnosis not present

## 2019-01-11 DIAGNOSIS — R197 Diarrhea, unspecified: Secondary | ICD-10-CM | POA: Diagnosis not present

## 2019-01-12 ENCOUNTER — Telehealth: Payer: Self-pay

## 2019-01-12 ENCOUNTER — Other Ambulatory Visit: Payer: Self-pay

## 2019-01-12 DIAGNOSIS — R197 Diarrhea, unspecified: Secondary | ICD-10-CM | POA: Diagnosis not present

## 2019-01-12 DIAGNOSIS — D72829 Elevated white blood cell count, unspecified: Secondary | ICD-10-CM | POA: Diagnosis not present

## 2019-01-12 DIAGNOSIS — R338 Other retention of urine: Secondary | ICD-10-CM | POA: Diagnosis not present

## 2019-01-12 MED ORDER — ATORVASTATIN CALCIUM 40 MG PO TABS
40.00 | ORAL_TABLET | ORAL | Status: DC
Start: 2019-01-12 — End: 2019-01-12

## 2019-01-12 MED ORDER — MELATONIN 3 MG PO TABS
3.00 | ORAL_TABLET | ORAL | Status: DC
Start: ? — End: 2019-01-12

## 2019-01-12 MED ORDER — FLUTICASONE FUROATE-VILANTEROL 100-25 MCG/INH IN AEPB
1.00 | INHALATION_SPRAY | RESPIRATORY_TRACT | Status: DC
Start: 2019-01-13 — End: 2019-01-12

## 2019-01-12 MED ORDER — ACETAMINOPHEN 325 MG PO TABS
650.00 | ORAL_TABLET | ORAL | Status: DC
Start: ? — End: 2019-01-12

## 2019-01-12 MED ORDER — CLOPIDOGREL BISULFATE 75 MG PO TABS
75.00 | ORAL_TABLET | ORAL | Status: DC
Start: 2019-01-13 — End: 2019-01-12

## 2019-01-12 MED ORDER — ALBUTEROL SULFATE (2.5 MG/3ML) 0.083% IN NEBU
2.50 | INHALATION_SOLUTION | RESPIRATORY_TRACT | Status: DC
Start: ? — End: 2019-01-12

## 2019-01-12 MED ORDER — ONDANSETRON 4 MG PO TBDP
4.00 | ORAL_TABLET | ORAL | Status: DC
Start: ? — End: 2019-01-12

## 2019-01-12 MED ORDER — ESCITALOPRAM OXALATE 10 MG PO TABS
15.00 | ORAL_TABLET | ORAL | Status: DC
Start: 2019-01-13 — End: 2019-01-12

## 2019-01-12 MED ORDER — FUMATINIC PO
30.00 | ORAL | Status: DC
Start: 2019-01-12 — End: 2019-01-12

## 2019-01-12 MED ORDER — EQ MICONAZOLE 7 VA
875.00 | VAGINAL | Status: DC
Start: 2019-01-12 — End: 2019-01-12

## 2019-01-12 NOTE — Telephone Encounter (Signed)
Copied from [place] [phone]. Topic: General - Other >> Jan 12, 2019  3:57 PM Leward Quan A wrote: Reason for CRM: Patient daughter Steva Ready called to request a call back from the nurse need to discuss the fact that the hospital want to make some changes to the patients medication. Patient being released from the hospital today. Please call Ph# [phone]

## 2019-01-12 NOTE — Telephone Encounter (Signed)
Copied from [place] [id number]. Topic: General - Other >> Jan 12, 2019  2:38 PM Leward Quan A wrote: Reason for CRM: Daughter Steva Ready called to say that patient is in the hospital should be discharged today but will not be able to come in for labs tomorrow she will call back to reschedule.

## 2019-01-13 ENCOUNTER — Other Ambulatory Visit: Payer: Medicare Other

## 2019-01-14 ENCOUNTER — Telehealth: Payer: Self-pay

## 2019-01-14 NOTE — Telephone Encounter (Signed)
Yes I will give verbal authorization to continue home health

## 2019-01-14 NOTE — Telephone Encounter (Signed)
Called and spoke to Anna Prince from home health agency.  [name] would like for Dr. Derrel Nip to co-coordinate plan of care with hospital physician.  [name] said that plan of care is in Epic under Care Everywhere.

## 2019-01-14 NOTE — Telephone Encounter (Signed)
Copied from [place] [id number]. Topic: General - Inquiry >> Jan 14, 2019 10:52 AM Richardo Priest, NT wrote: Reason for CRM: [name] called in with home health agency and stated patient was released from hospital and is going to be needing home health. Stated there is an order already in they are just needing to see if PCP will give verbal confirmation for patient to be seen so they can continue with the plan of care. Please advise. Call back is [phone].

## 2019-01-15 NOTE — Telephone Encounter (Signed)
Spoke with Anna Prince to give verbal orders to continue home health and she stated that she spoke with pt and pt's family yesterday evening and the declined home health services for now.

## 2019-01-15 NOTE — Telephone Encounter (Signed)
LMTCB

## 2019-01-21 ENCOUNTER — Ambulatory Visit (INDEPENDENT_AMBULATORY_CARE_PROVIDER_SITE_OTHER): Payer: Medicare Other | Admitting: Internal Medicine

## 2019-01-21 ENCOUNTER — Encounter: Payer: Self-pay | Admitting: Internal Medicine

## 2019-01-21 VITALS — Ht 65.0 in | Wt 114.0 lb

## 2019-01-21 DIAGNOSIS — R531 Weakness: Secondary | ICD-10-CM | POA: Diagnosis not present

## 2019-01-21 DIAGNOSIS — R634 Abnormal weight loss: Secondary | ICD-10-CM | POA: Diagnosis not present

## 2019-01-21 DIAGNOSIS — R197 Diarrhea, unspecified: Secondary | ICD-10-CM

## 2019-01-21 DIAGNOSIS — D509 Iron deficiency anemia, unspecified: Secondary | ICD-10-CM

## 2019-01-21 DIAGNOSIS — G4701 Insomnia due to medical condition: Secondary | ICD-10-CM

## 2019-01-21 DIAGNOSIS — Z09 Encounter for follow-up examination after completed treatment for conditions other than malignant neoplasm: Secondary | ICD-10-CM

## 2019-01-21 DIAGNOSIS — F331 Major depressive disorder, recurrent, moderate: Secondary | ICD-10-CM

## 2019-01-21 DIAGNOSIS — G253 Myoclonus: Secondary | ICD-10-CM

## 2019-01-21 MED ORDER — TRAZODONE HCL 50 MG PO TABS: ORAL_TABLET | ORAL | 3 refills | Status: AC

## 2019-01-21 MED ORDER — CLOPIDOGREL BISULFATE 75 MG PO TABS: ORAL_TABLET | ORAL | 1 refills | Status: AC

## 2019-01-21 NOTE — Assessment & Plan Note (Signed)
Patient is stable post discharge and has no new issues or questions about discharge plans at the visit today for hospital follow up. All labs , imaging studies and progress notes from admission were reviewed with patient today   

## 2019-01-21 NOTE — Assessment & Plan Note (Signed)
Medication changed during [facility] admission due to urinary retention.  paxil changed to lexapro and remeron dose decreased

## 2019-01-21 NOTE — Assessment & Plan Note (Signed)
Chronic,  With cramping and significant weight loss.  colonoscopy needed to rule out malignancy,  Ischemic colitis.  Gi referral made

## 2019-01-21 NOTE — Assessment & Plan Note (Signed)
WORSENING BY [facility]  Etiology  multifactorial:  CKD  ,  IRON and B12 deficient. Gi referral made

## 2019-01-21 NOTE — Progress Notes (Signed)
Virtual Visit via [email or web address]   This visit type was conducted due to national recommendations for restrictions regarding the COVID-19 pandemic (e.g. social distancing).  This format is felt to be most appropriate for this patient at this time.  All issues noted in this document were discussed and addressed.  No physical exam was performed (except for noted visual exam findings with Video Visits).   I connected with@ on 01/21/19 at 11:00 AM EDT by a video enabled telemedicine application  and verified that I am speaking with the correct person using two identifiers. Location patient: home Location provider: work or home office Persons participating in the virtual visit: patient, provider  I discussed the limitations, risks, security and privacy concerns of performing an evaluation and management service by telephone and the availability of in person appointments. I also discussed with the patient that there may be a patient responsible charge related to this service. The patient expressed understanding and agreed to proceed.  Reason for visit: hospital follow up   HPI:  85 yr old female with COPD , ongoing tobacco abuse, vascular dementia  , IDA , chronic pain   Admitted to [facility] on Sept 18 on sept 18 with abdominal pain and diarrhea.    She was evaluated with CT , treated and discharged home on Sept 21 with augmentin  And zofran.  No diverticulitis was seen on  CT,  No stool was collected despite 2 days  In house, so c diff testing was not completed   Iron supplement suspended and GI referral was recommended as an outpatient  Since she refused in patient evaluation ,  as well as home health referral . Diazepam and vicodin were held as well,  And megace discontinued   Since discharge The diarrhea has returned and she has several episodes daily including nocturnal episodes  waking her from sleep.   The diarrhea comes after cramping and progressively softer stools. .  She reports an excellent but  has lost significant weight and is now 98 lbs per daughter.    Has not been sleeping since diazepam was stopped.   paxil changed to lexapro  remeron dose reduce to 30 mg (all due to episode of urinary retention.   ROS: See pertinent positives and negatives per HPI.  Past Medical History:  Diagnosis Date  . AAA (abdominal aortic aneurysm) ([place])   . Anxiety   . Aortic aneurysm ([place])   . Arthritis   . Barrett's esophagus 2007  . Bursitis 2012   left hip, improved with periodic steroid injection [facility])  . COPD (chronic obstructive pulmonary disease) ([place])   . Coronary artery disease    a. LHC (08/27/16): 15% ostial LM, 99% pLAD and D1, Ramus & LCx nl, m/dRCA diffuse 80-90%, PDA occluded proximally, fills via L-R collats, EF 50-55%; b. PCI (08/31/16): Severe pLAD dz involving D1, successful orbital atherectomy PCI to pLAD w/ Synergy 3.0 x 20 mm DES, 0% residual stenosis & TIMI 3 flow, occluded ostD1, medically managed given lack of CP  . Depression   . Diastolic dysfunction    a. TTE (08/25/16): Mild LVH with LVEF of 50-55% and grade 1 diastolic dysfunction. Mild MR. Mild right atrial enlargement. Normal RV size and function  . GERD (gastroesophageal reflux disease)    with Barretts Esophagus  . GI bleed 03/26/2017  . GI bleeding    a. required pRBC May, July, and December 2018  . Hyperlipidemia   . Hypertension   . Iron  deficiency anemia 2012  . Lumbago   . Macular degeneration   . Osteoporosis   . Shingles    right back/abdomen 08/16/17   . Sigmoid diverticulosis    by colonoscopy  . Stroke [facility])     Past Surgical History:  Procedure Laterality Date  . ABDOMINAL AORTIC ANEURYSM REPAIR  July 2010   [facility]  . APPENDECTOMY    . BACK SURGERY     X2..1975 arachnoid cyst cervical region([place]);1997 lumbosacral tumor,9 hr surgery  . CARDIAC CATHETERIZATION    . CARDIAC CATHETERIZATION    . cardiac stents    . COLONOSCOPY N/A 03/30/2017   Procedure: COLONOSCOPY;   Surgeon: Lin Landsman, MD;  Location: [facility] ENDOSCOPY;  Service: Gastroenterology;  Laterality: N/A;  . CORONARY ATHERECTOMY N/A 08/31/2016   Procedure: Coronary Atherectomy;  Surgeon: Nelva [place], MD;  Location: [place] CV LAB;  Service: Cardiovascular;  Laterality: N/A;  . ESOPHAGOGASTRODUODENOSCOPY N/A 03/29/2017   Procedure: ESOPHAGOGASTRODUODENOSCOPY (EGD);  Surgeon: Virgel Manifold, MD;  Location: [facility] ENDOSCOPY;  Service: Endoscopy;  Laterality: N/A;  . ESOPHAGOGASTRODUODENOSCOPY (EGD) WITH PROPOFOL N/A 09/02/2016   Procedure: ESOPHAGOGASTRODUODENOSCOPY (EGD) WITH PROPOFOL;  Surgeon: Otis Brace, MD;  Location: [facility] ENDOSCOPY;  Service: Gastroenterology;  Laterality: N/A;  . ESOPHAGOGASTRODUODENOSCOPY (EGD) WITH PROPOFOL N/A 11/06/2016   Procedure: ESOPHAGOGASTRODUODENOSCOPY (EGD) WITH PROPOFOL;  Surgeon: Lucilla Lame, MD;  Location: [facility] ENDOSCOPY;  Service: Endoscopy;  Laterality: N/A;  . FLEXIBLE SIGMOIDOSCOPY N/A 03/29/2017   Procedure: FLEXIBLE SIGMOIDOSCOPY;  Surgeon: Virgel Manifold, MD;  Location: [facility] ENDOSCOPY;  Service: Endoscopy;  Laterality: N/A;  . INTRAVASCULAR ULTRASOUND/IVUS N/A 08/31/2016   Procedure: Intravascular Ultrasound/IVUS;  Surgeon: Nelva [place], MD;  Location: [place] CV LAB;  Service: Cardiovascular;  Laterality: N/A;  . LEFT HEART CATH AND CORONARY ANGIOGRAPHY N/A 08/27/2016   Procedure: Left Heart Cath and Coronary Angiography;  Surgeon: Teodoro Spray, MD;  Location: [place] CV LAB;  Service: Cardiovascular;  Laterality: N/A;    Family History  Problem Relation Age of Onset  . Coronary artery disease Mother   . Alzheimer's disease Mother   . Coronary artery disease Father 33  . Heart attack Father   . Throat cancer Brother   . Lung cancer Maternal Aunt   . Kidney cancer Daughter   . Cervical cancer Daughter     SOCIAL HX:  reports that she has been smoking cigarettes. She has been smoking about 0.50 packs per day.  She has never used smokeless tobacco. She reports that she does not drink alcohol or use drugs.   Current Outpatient Medications:  .  albuterol (VENTOLIN HFA) 108 (90 Base) MCG/ACT inhaler, Inhale 2 puffs into the lungs every 6 (six) hours as needed for wheezing or shortness of breath., Disp: 1 Inhaler, Rfl: 2 .  atorvastatin (LIPITOR) 40 MG tablet, TAKE 1 TABLET(40 MG) BY MOUTH DAILY AT 6 PM (Patient taking differently: Take 40 mg by mouth daily. ), Disp: 90 tablet, Rfl: 1 .  clopidogrel (PLAVIX) 75 MG tablet, TAKE 1 TABLET(75 MG) BY MOUTH DAILY, Disp: 90 tablet, Rfl: 1 .  cyanocobalamin (,VITAMIN B-12,) 1000 MCG/ML injection, Daily for 3 days,  Then weekly x 4 then monthly thereafter, Disp: 10 mL, Rfl: 0 .  ergocalciferol (DRISDOL) 1.25 MG (50000 UT) capsule, Take 1 capsule (50,000 Units total) by mouth once a week., Disp: 12 capsule, Rfl: 3 .  escitalopram (LEXAPRO) 5 MG tablet, , Disp: , Rfl:  .  ferrous sulfate 325 (65 FE) MG tablet, Take 1 tablet (  325 mg total) by mouth daily with breakfast., Disp: 30 tablet, Rfl: 3 .  nitroGLYCERIN (NITROSTAT) 0.4 MG SL tablet, Place 1 tablet (0.4 mg total) under the tongue every 5 (five) minutes x 3 doses as needed for chest pain., Disp: 90 tablet, Rfl: 1 .  ondansetron (ZOFRAN-ODT) 4 MG disintegrating tablet, , Disp: , Rfl:  .  SYMBICORT 80-4.5 MCG/ACT inhaler, Inhale 2 puffs into the lungs 2 (two) times daily., Disp: 10.2 g, Rfl: 2 .  Syringe/Needle, Disp, (SYRINGE 3CC/25GX1") 25G X 1" 3 ML MISC, Use for b12 injections, Disp: 50 each, Rfl: 0 .  diazepam (VALIUM) 5 MG tablet, Take 1 tablet (5 mg total) by mouth 2 (two) times daily as needed for anxiety. (Patient not taking: Reported on 01/21/2019), Disp: 60 tablet, Rfl: 0 .  HYDROcodone-acetaminophen (NORCO/VICODIN) 5-325 MG tablet, Take 1 tablet by mouth 2 (two) times daily as needed for moderate pain. Maximum 2 daily. (Patient not taking: Reported on 01/21/2019), Disp: 60 tablet, Rfl: 0 .   HYDROcodone-acetaminophen (NORCO/VICODIN) 5-325 MG tablet, Take 1 tablet by mouth 2 (two) times daily as needed. Maximum 2 daily (Patient not taking: Reported on 01/21/2019), Disp: 60 tablet, Rfl: 0 .  [START ON 02/10/2019] HYDROcodone-acetaminophen (NORCO/VICODIN) 5-325 MG tablet, Take 1 tablet by mouth 2 (two) times daily as needed for moderate pain. Maximum 2 daily. (Patient not taking: Reported on 01/21/2019), Disp: 60 tablet, Rfl: 0 .  megestrol (MEGACE) 40 MG tablet, Take 1 tablet (40 mg total) by mouth daily. (Patient not taking: Reported on 01/21/2019), Disp: 30 tablet, Rfl: 2 .  traZODone (DESYREL) 50 MG tablet, 1-2 tablets one hour before bedtime for insomnia, Disp: 30 tablet, Rfl: 3  EXAM:  VITALS per patient if applicable:  GENERAL: alert, oriented, appears well and in no acute distress  HEENT: atraumatic, conjunttiva clear, no obvious abnormalities on inspection of external nose and ears  NECK: normal movements of the head and neck  LUNGS: on inspection no signs of respiratory distress, breathing rate appears normal, no obvious gross SOB, gasping or wheezing  CV: no obvious cyanosis  MS: moves all visible extremities without noticeable abnormality  PSYCH/NEURO: pleasant and cooperative, no obvious depression or anxiety, speech and thought processing grossly intact  ASSESSMENT AND PLAN:  Discussed the following assessment and plan:  Diarrhea, unspecified type - Plan: Ambulatory referral to Gastroenterology, Ambulatory referral to [place]  Excessive weight loss - Plan: Ambulatory referral to Gastroenterology, Ambulatory referral to [place] weakness - Plan: Ambulatory referral to [place]  Major depressive disorder, recurrent episode, moderate (HCC)  Iron deficiency anemia, unspecified iron deficiency anemia type  Hospital discharge follow-up  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  paxil changed to lexapro and remeron dose decreased   Diarrhea Chronic,  With cramping and significant weight loss.  colonoscopy needed to rule out malignancy,  Ischemic colitis.  Gi referral made   Anemia WORSENING BY [facility] LABS  Etiology  multifactorial:  CKD  ,  IRON and B12 deficient. Gi referral made   Hospital discharge follow-up Patient is stable post discharge and has no new issues or questions about discharge plans at the visit today for hospital follow up. 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  Trial of trazodone/  I discussed the assessment and treatment plan with the patient. The patient was provided an opportunity to ask questions and all were answered. The patient agreed with the plan and demonstrated an understanding of the instructions.   The patient was advised to call back or seek an in-person evaluation if the symptoms worsen or if the condition fails to improve as anticipated.  I provided  25 minutes of non-face-to-face time during this encounter reviewing patient's current problems and post surgeries.  Providing counseling on the above mentioned problems , and coordination  of care .  Crecencio Mc, MD

## 2019-01-21 NOTE — Assessment & Plan Note (Signed)
She is not sleeping since valium was d/c'd.  Trial of trazodone/

## 2019-01-22 DIAGNOSIS — J9611 Chronic respiratory failure with hypoxia: Secondary | ICD-10-CM | POA: Diagnosis not present

## 2019-01-22 DIAGNOSIS — J449 Chronic obstructive pulmonary disease, unspecified: Secondary | ICD-10-CM | POA: Diagnosis not present

## 2019-01-27 ENCOUNTER — Other Ambulatory Visit: Payer: Self-pay

## 2019-01-27 MED ORDER — ATORVASTATIN CALCIUM 40 MG PO TABS: ORAL_TABLET | ORAL | 3 refills | Status: AC

## 2019-01-30 ENCOUNTER — Telehealth: Payer: Self-pay

## 2019-01-30 NOTE — Telephone Encounter (Signed)
Copied from [place] [id number]. Topic: General - Other >> Jan 30, 2019  2:28 PM Leward Quan A wrote: Reason for CRM: Tiffany with [facility] care Specialist called to Dr Derrel Nip that they received the referral for patient care but can not accept her at this time because they are over capacity at this time.

## 2019-02-03 NOTE — Telephone Encounter (Signed)
Please ask patient if she will accept home care  from another provider

## 2019-02-03 NOTE — Telephone Encounter (Signed)
FYI

## 2019-02-05 NOTE — Telephone Encounter (Signed)
SHE WILL NEED TO MAKE ANOTHER APPT TO MANAGE ALL OF THE AGITATION AND NUMBNESS ISSUES.  I will not restart the valium until that is done.   Melissa can you repern the hoe health referral or do I need to do another order?

## 2019-02-05 NOTE — Telephone Encounter (Signed)
Spoke with pt's daughter and she stated that they would be willing to get home care services from another provider. While on the phone with the daughter she stated that since her mother was taken off of the Diazepam while in the hospital and not been put back on it she has been getting worse. The daughter stated that her mothers dementia is getting worse, she is very argumentative, and has gotten all off her sleep schedule sleeping longer in the day and less at night. The daughter also wanted to let you know that the pt has been complaining for her fingers tingling and feeling like they are going numb.

## 2019-02-09 NOTE — Telephone Encounter (Signed)
I will re-open it.

## 2019-02-13 NOTE — Telephone Encounter (Signed)
Dr. Derrel Nip, I found [facility] that will be able to accept her, but they can not get out there this weekend to start care.  "Is it ok if we see her by Wednesday? We are hoping for Monday but promise by wednesday. Just let me know."  Is this acceptable? Melissa

## 2019-02-13 NOTE — Telephone Encounter (Signed)
Spoke with pt's daughter to let her know about the home health agency that will be able to come out for visits with pt. Also schedule the pt a follow up appt next Wednesday. Pt's daughter is aware of appt date and time.

## 2019-02-13 NOTE — Telephone Encounter (Signed)
Anything is acceptable!!  Thank you so much !!

## 2019-02-13 NOTE — Telephone Encounter (Signed)
Please let [name] know that [facility] finally found an agency that will accept her for home care and they will be there by Wednesday  at the latess

## 2019-02-16 ENCOUNTER — Ambulatory Visit (INDEPENDENT_AMBULATORY_CARE_PROVIDER_SITE_OTHER): Payer: Medicare Other | Admitting: Gastroenterology

## 2019-02-16 ENCOUNTER — Encounter: Payer: Self-pay | Admitting: Gastroenterology

## 2019-02-16 ENCOUNTER — Other Ambulatory Visit: Payer: Self-pay

## 2019-02-16 VITALS — BP 163/63 | HR 81 | Temp 97.2°F | Ht 65.0 in | Wt 97.0 lb

## 2019-02-16 DIAGNOSIS — R110 Nausea: Secondary | ICD-10-CM

## 2019-02-16 DIAGNOSIS — K580 Irritable bowel syndrome with diarrhea: Secondary | ICD-10-CM

## 2019-02-16 NOTE — Progress Notes (Signed)
Primary Care Physician: Crecencio Mc, MD  Primary Gastroenterologist:  Dr. Lucilla Lame  Chief Complaint  Patient presents with  . Diarrhea    HPI: [facility] is a 85 y.o. female here for diarrhea with nausea and low iron saturation.  The patient comes with her daughter today who reports that the patient had diarrhea approximately 2 weeks ago.  She differs from the patient who says it happens last month.  The daughter states that the mother has some dementia and memory problems.  There has been a gradual weight loss over the last few years.  The patient had an EGD and a colonoscopy back in 2018. The patient diarrhea happens intermittently and they are not able to associate it with anything the patient eats or drinks.  There is no report of any black stools or bloody stools.  The patient has also had some intermittent nausea also unrelated to food or time of day.  The patient denies any abdominal pain.  The patient's iron was normal at 44 with the ferritin being normal at 34.  The saturation was 14.3 which is lower than the normal of 20  Current Outpatient Medications  Medication Sig Dispense Refill  . mirtazapine (REMERON) 30 MG tablet Take by mouth.    Marland Kitchen albuterol (VENTOLIN HFA) 108 (90 Base) MCG/ACT inhaler Inhale 2 puffs into the lungs every 6 (six) hours as needed for wheezing or shortness of breath. 1 Inhaler 2  . atorvastatin (LIPITOR) 40 MG tablet TAKE 1 TABLET(40 MG) BY MOUTH DAILY AT 6 PM 90 tablet 3  . clopidogrel (PLAVIX) 75 MG tablet TAKE 1 TABLET(75 MG) BY MOUTH DAILY 90 tablet 1  . cyanocobalamin (,VITAMIN B-12,) 1000 MCG/ML injection Daily for 3 days,  Then weekly x 4 then monthly thereafter 10 mL 0  . diazepam (VALIUM) 5 MG tablet Take 1 tablet (5 mg total) by mouth 2 (two) times daily as needed for anxiety. (Patient not taking: Reported on 01/21/2019) 60 tablet 0  . ergocalciferol (DRISDOL) 1.25 MG (50000 UT) capsule Take 1 capsule (50,000 Units total) by mouth once  a week. 12 capsule 3  . escitalopram (LEXAPRO) 5 MG tablet     . ferrous sulfate 325 (65 FE) MG tablet Take 1 tablet (325 mg total) by mouth daily with breakfast. 30 tablet 3  . HYDROcodone-acetaminophen (NORCO/VICODIN) 5-325 MG tablet Take 1 tablet by mouth 2 (two) times daily as needed for moderate pain. Maximum 2 daily. (Patient not taking: Reported on 01/21/2019) 60 tablet 0  . HYDROcodone-acetaminophen (NORCO/VICODIN) 5-325 MG tablet Take 1 tablet by mouth 2 (two) times daily as needed. Maximum 2 daily (Patient not taking: Reported on 01/21/2019) 60 tablet 0  . HYDROcodone-acetaminophen (NORCO/VICODIN) 5-325 MG tablet Take 1 tablet by mouth 2 (two) times daily as needed for moderate pain. Maximum 2 daily. (Patient not taking: Reported on 01/21/2019) 60 tablet 0  . megestrol (MEGACE) 40 MG tablet Take 1 tablet (40 mg total) by mouth daily. (Patient not taking: Reported on 01/21/2019) 30 tablet 2  . nitroGLYCERIN (NITROSTAT) 0.4 MG SL tablet Place 1 tablet (0.4 mg total) under the tongue every 5 (five) minutes x 3 doses as needed for chest pain. 90 tablet 1  . ondansetron (ZOFRAN-ODT) 4 MG disintegrating tablet     . SYMBICORT 80-4.5 MCG/ACT inhaler Inhale 2 puffs into the lungs 2 (two) times daily. 10.2 g 2  . Syringe/Needle, Disp, (SYRINGE 3CC/25GX1") 25G X 1" 3 ML MISC Use for b12 injections  50 each 0  . traZODone (DESYREL) 50 MG tablet 1-2 tablets one hour before bedtime for insomnia 30 tablet 3   No current facility-administered medications for this visit.     Allergies as of 02/16/2019 - Review Complete 02/16/2019  Allergen Reaction Noted  . Morphine sulfate Other (See Comments) 12/08/2010    ROS:  General: Negative for anorexia, weight loss, fever, chills, fatigue, weakness. ENT: Negative for hoarseness, difficulty swallowing , nasal congestion. CV: Negative for chest pain, angina, palpitations, dyspnea on exertion, peripheral edema.  Respiratory: Negative for dyspnea at rest, dyspnea  on exertion, cough, sputum, wheezing.  GI: See history of present illness. GU:  Negative for dysuria, hematuria, urinary incontinence, urinary frequency, nocturnal urination.  Endo: Negative for unusual weight change.    Physical Examination:   BP (!) 163/63   Pulse 81   Temp (!) 97.2 F (36.2 C) (Temporal)   Ht 5\' 5"  (1.651 m)   Wt 97 lb (44 kg)   BMI 16.14 kg/m   General: Well-nourished, well-developed in no acute distress.  Eyes: No icterus. Conjunctivae pink. Mouth: Oropharyngeal mucosa moist and pink , no lesions erythema or exudate. Lungs: Clear to auscultation bilaterally. Non-labored. Heart: Regular rate and rhythm, no murmurs rubs or gallops.  Abdomen: Bowel sounds are normal, nontender, nondistended, no hepatosplenomegaly or masses, no abdominal bruits or hernia , no rebound or guarding.   Extremities: No lower extremity edema. No clubbing or deformities. Neuro: Alert and oriented x 3.  Grossly intact. Skin: Warm and dry, no jaundice.   Psych: Alert and cooperative, normal mood and affect.  Labs:    Imaging Studies: No results found.  Assessment and Plan:   Anna Prince is a 85 y.o. y/o female who comes with nausea and diarrhea.  The patient has intermittent diarrhea not associated with anything she eats or drinks.  The diarrhea will sometimes come at night and so will the nausea unrelated to eating.  The patient has been told that if the nausea is from a GI point of view that reflux can cause some her symptoms so she will be started on a trial of Dexilant and has been given samples.  The patient will also increase fiber in her diet since the diarrhea is intermittent I do not want her to get constipated.  The family has suggested that they are not willing to undergo any other invasive procedures at her age.  I do not disagree with them.  The patient will let me know if the Dexilant and fiber help her symptoms if not she will contact me.   This visit consisted of 35  minutes face to face contact with myself and at least 50% of this time was spent in counseling and education regarding diagnosis, treatment options, medication management, risks and benefits of treatment.   Lucilla Lame, MD. Marval Regal    Note: This dictation was prepared with Dragon dictation along with smaller phrase technology. Any transcriptional errors that result from this process are unintentional.

## 2019-02-18 ENCOUNTER — Encounter: Payer: Self-pay | Admitting: Internal Medicine

## 2019-02-18 ENCOUNTER — Ambulatory Visit (INDEPENDENT_AMBULATORY_CARE_PROVIDER_SITE_OTHER): Payer: Medicare Other | Admitting: Internal Medicine

## 2019-02-18 DIAGNOSIS — G253 Myoclonus: Secondary | ICD-10-CM

## 2019-02-18 DIAGNOSIS — G4701 Insomnia due to medical condition: Secondary | ICD-10-CM | POA: Diagnosis not present

## 2019-02-18 DIAGNOSIS — R634 Abnormal weight loss: Secondary | ICD-10-CM | POA: Diagnosis not present

## 2019-02-18 DIAGNOSIS — R197 Diarrhea, unspecified: Secondary | ICD-10-CM | POA: Diagnosis not present

## 2019-02-18 MED ORDER — DIAZEPAM 5 MG PO TABS: 5.0000 mg | ORAL_TABLET | Freq: Every day | ORAL | 5 refills | Status: AC

## 2019-02-18 NOTE — Progress Notes (Signed)
Saw Dr. Allen Norris yesterday for the diarrhea and he prescribed pt dexilant 60mg  twice daily. Not sleeping good, taking two trazodone an hour before bedtime and pt is still restless. Pt dementia is getting worse, she is forgetting things more and more.

## 2019-02-18 NOTE — Progress Notes (Signed)
Virtual Visit CONVERTED TO TELEPHONE  Note  This visit type was conducted due to national recommendations for restrictions regarding the COVID-19 pandemic (e.g. social distancing).  This format is felt to be most appropriate for this patient at this time.  All issues noted in this document were discussed and addressed.  No physical exam was performed (except for noted visual exam findings with Video Visits).   I connected with@ on 02/18/19 at  4:00 PM EDT by a video enabled telemedicine application.   Interactive audio and video telecommunications were initially established beteen this provider and patient, however ultimately failed, due to patient having technical difficulties. We continued and completed visit with audio only  and verified that I am speaking with the correct person using two identifiersor telephone and verified that I am speaking with the correct person using two identifiers. Location patient: home Location provider: work or home office Persons participating in the virtual visit: patient, provider  I discussed the limitations, risks, security and privacy concerns of performing an evaluation and management service by telephone and the availability of in person appointments. I also discussed with the patient that there may be a patient responsible charge related to this service. The patient expressed understanding and agreed to proceed.   Reason for visit: INSOMNIA,  Worsening dementia  HPI:  Insomnia:  Not sleeping since valium was stopped   no falls since mid summer still taking remeron   appetite waxes and wanes  Due to sleep schedule being off track.  Underweight,  Currently 98 lbs .  Not gaining weight .  Diarrhea  Still occurring , but not daily.    Dr Allen Norris does not think she needs a colonoscopy given her age and her recent   colonsocopy in  2018 . Diarrhea was attributed to IBS  And she was advised  to continue use of imodium    ROS: See pertinent positives and negatives  per HPI.  Past Medical History:  Diagnosis Date  . AAA (abdominal aortic aneurysm) ([place])   . Anxiety   . Aortic aneurysm ([place])   . Arthritis   . Barrett's esophagus 2007  . Bursitis 2012   left hip, improved with periodic steroid injection [facility])  . COPD (chronic obstructive pulmonary disease) ([place])   . Coronary artery disease    a. LHC (08/27/16): 15% ostial LM, 99% pLAD and D1, Ramus & LCx nl, m/dRCA diffuse 80-90%, PDA occluded proximally, fills via L-R collats, EF 50-55%; b. PCI (08/31/16): Severe pLAD dz involving D1, successful orbital atherectomy PCI to pLAD w/ Synergy 3.0 x 20 mm DES, 0% residual stenosis & TIMI 3 flow, occluded ostD1, medically managed given lack of CP  . Depression   . Diastolic dysfunction    a. TTE (08/25/16): Mild LVH with LVEF of 50-55% and grade 1 diastolic dysfunction. Mild MR. Mild right atrial enlargement. Normal RV size and function  . GERD (gastroesophageal reflux disease)    with Barretts Esophagus  . GI bleed 03/26/2017  . GI bleeding    a. required pRBC May, July, and December 2018  . Hyperlipidemia   . Hypertension   . Iron deficiency anemia 2012  . Lumbago   . Macular degeneration   . Osteoporosis   . Shingles    right back/abdomen 08/16/17   . Sigmoid diverticulosis    by colonoscopy  . Stroke [facility])     Past Surgical History:  Procedure Laterality Date  . ABDOMINAL AORTIC ANEURYSM REPAIR  July 2010  [place]    . BACK SURGERY     X2..1975 arachnoid cyst cervical region([place]);1997 lumbosacral tumor,9 hr surgery  . CARDIAC CATHETERIZATION    . CARDIAC CATHETERIZATION    . cardiac stents    . COLONOSCOPY N/A 03/30/2017   Procedure: COLONOSCOPY;  Surgeon: Lin Landsman, MD;  Location: [facility] ENDOSCOPY;  Service: Gastroenterology;  Laterality: N/A;  . CORONARY ATHERECTOMY N/A 08/31/2016   Procedure: Coronary Atherectomy;  Surgeon: Nelva [place], MD;  Location: [place] CV LAB;  Service:  Cardiovascular;  Laterality: N/A;  . ESOPHAGOGASTRODUODENOSCOPY N/A 03/29/2017   Procedure: ESOPHAGOGASTRODUODENOSCOPY (EGD);  Surgeon: Virgel Manifold, MD;  Location: [facility] ENDOSCOPY;  Service: Endoscopy;  Laterality: N/A;  . ESOPHAGOGASTRODUODENOSCOPY (EGD) WITH PROPOFOL N/A 09/02/2016   Procedure: ESOPHAGOGASTRODUODENOSCOPY (EGD) WITH PROPOFOL;  Surgeon: Otis Brace, MD;  Location: [facility] ENDOSCOPY;  Service: Gastroenterology;  Laterality: N/A;  . ESOPHAGOGASTRODUODENOSCOPY (EGD) WITH PROPOFOL N/A 11/06/2016   Procedure: ESOPHAGOGASTRODUODENOSCOPY (EGD) WITH PROPOFOL;  Surgeon: Lucilla Lame, MD;  Location: [facility] ENDOSCOPY;  Service: Endoscopy;  Laterality: N/A;  . FLEXIBLE SIGMOIDOSCOPY N/A 03/29/2017   Procedure: FLEXIBLE SIGMOIDOSCOPY;  Surgeon: Virgel Manifold, MD;  Location: [facility] ENDOSCOPY;  Service: Endoscopy;  Laterality: N/A;  . INTRAVASCULAR ULTRASOUND/IVUS N/A 08/31/2016   Procedure: Intravascular Ultrasound/IVUS;  Surgeon: Nelva [place], MD;  Location: [place] CV LAB;  Service: Cardiovascular;  Laterality: N/A;  . LEFT HEART CATH AND CORONARY ANGIOGRAPHY N/A 08/27/2016   Procedure: Left Heart Cath and Coronary Angiography;  Surgeon: Teodoro Spray, MD;  Location: [place] CV LAB;  Service: Cardiovascular;  Laterality: N/A;    Family History  Problem Relation Age of Onset  . Coronary artery disease Mother   . Alzheimer's disease Mother   . Coronary artery disease Father 24  . Heart attack Father   . Throat cancer Brother   . Lung cancer Maternal Aunt   . Kidney cancer Daughter   . Cervical cancer Daughter     SOCIAL HX:  reports that she has been smoking cigarettes. She has been smoking about 0.50 packs per day. She has never used smokeless tobacco. She reports that she does not drink alcohol or use drugs.   Current Outpatient Medications:  .  albuterol (VENTOLIN HFA) 108 (90 Base) MCG/ACT inhaler, Inhale 2 puffs into the lungs every 6 (six) hours as needed  for wheezing or shortness of breath., Disp: 1 Inhaler, Rfl: 2 .  atorvastatin (LIPITOR) 40 MG tablet, TAKE 1 TABLET(40 MG) BY MOUTH DAILY AT 6 PM, Disp: 90 tablet, Rfl: 3 .  clopidogrel (PLAVIX) 75 MG tablet, TAKE 1 TABLET(75 MG) BY MOUTH DAILY, Disp: 90 tablet, Rfl: 1 .  cyanocobalamin (,VITAMIN B-12,) 1000 MCG/ML injection, Daily for 3 days,  Then weekly x 4 then monthly thereafter, Disp: 10 mL, Rfl: 0 .  ergocalciferol (DRISDOL) 1.25 MG (50000 UT) capsule, Take 1 capsule (50,000 Units total) by mouth once a week., Disp: 12 capsule, Rfl: 3 .  escitalopram (LEXAPRO) 5 MG tablet, , Disp: , Rfl:  .  ferrous sulfate 325 (65 FE) MG tablet, Take 1 tablet (325 mg total) by mouth daily with breakfast., Disp: 30 tablet, Rfl: 3 .  HYDROcodone-acetaminophen (NORCO/VICODIN) 5-325 MG tablet, Take 1 tablet by mouth 2 (two) times daily as needed for moderate pain. Maximum 2 daily., Disp: 60 tablet, Rfl: 0 .  HYDROcodone-acetaminophen (NORCO/VICODIN) 5-325 MG tablet, Take 1 tablet by mouth 2 (two) times daily as needed. Maximum 2 daily, Disp: 60 tablet, Rfl: 0 .  HYDROcodone-acetaminophen (  NORCO/VICODIN) 5-325 MG tablet, Take 1 tablet by mouth 2 (two) times daily as needed for moderate pain. Maximum 2 daily., Disp: 60 tablet, Rfl: 0 .  megestrol (MEGACE) 40 MG tablet, Take 1 tablet (40 mg total) by mouth daily., Disp: 30 tablet, Rfl: 2 .  mirtazapine (REMERON) 30 MG tablet, Take by mouth., Disp: , Rfl:  .  nitroGLYCERIN (NITROSTAT) 0.4 MG SL tablet, Place 1 tablet (0.4 mg total) under the tongue every 5 (five) minutes x 3 doses as needed for chest pain., Disp: 90 tablet, Rfl: 1 .  ondansetron (ZOFRAN-ODT) 4 MG disintegrating tablet, , Disp: , Rfl:  .  SYMBICORT 80-4.5 MCG/ACT inhaler, Inhale 2 puffs into the lungs 2 (two) times daily., Disp: 10.2 g, Rfl: 2 .  Syringe/Needle, Disp, (SYRINGE 3CC/25GX1") 25G X 1" 3 ML MISC, Use for b12 injections, Disp: 50 each, Rfl: 0 .  traZODone (DESYREL) 50 MG tablet, 1-2 tablets  one hour before bedtime for insomnia, Disp: 30 tablet, Rfl: 3 .  diazepam (VALIUM) 5 MG tablet, Take 1 tablet (5 mg total) by mouth at bedtime., Disp: 30 tablet, Rfl: 5  EXAM:  VITALS per patient if applicable:  GENERAL: alert, oriented, appears well and in no acute distress  HEENT: atraumatic, conjunttiva clear, no obvious abnormalities on inspection of external nose and ears  NECK: normal movements of the head and neck  LUNGS: on inspection no signs of respiratory distress, breathing rate appears normal, no obvious gross SOB, gasping or wheezing  CV: no obvious cyanosis  MS: moves all visible extremities without noticeable abnormality  PSYCH/NEURO: pleasant and cooperative, no obvious depression or anxiety, speech and thought processing grossly intact  ASSESSMENT AND PLAN:  Discussed the following assessment and plan:  Diarrhea, unspecified type  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  Given her age,  No further workup    I discussed the assessment and treatment plan with the patient. The patient was provided an opportunity to ask questions and all were answered. The patient agreed with the plan and demonstrated an understanding of the instructions.   The patient was advised to call back or seek an in-person evaluation if the symptoms worsen or if the condition fails to improve as anticipated.  I provided  15 minutes of non-face-to-face time during this encounter reviewing patient's current problems and post surgeries.  Providing counseling on the above mentioned problems , and coordination  of care .  Anna Mc, MD

## 2019-02-19 NOTE — Assessment & Plan Note (Signed)
Attributed to dementia,  Other chronic illnesses.  Given her age,  No further workup

## 2019-02-19 NOTE — Assessment & Plan Note (Signed)
Resuming 5 mg diazepam after one month of poor sleep with trial of trazodone

## 2019-02-19 NOTE — Assessment & Plan Note (Signed)
Attributed to IBS per GI evaluation.  Continue Imodium

## 2019-02-22 DIAGNOSIS — J449 Chronic obstructive pulmonary disease, unspecified: Secondary | ICD-10-CM | POA: Diagnosis not present

## 2019-02-22 DIAGNOSIS — J9611 Chronic respiratory failure with hypoxia: Secondary | ICD-10-CM | POA: Diagnosis not present

## 2019-02-24 DIAGNOSIS — G253 Myoclonus: Secondary | ICD-10-CM | POA: Diagnosis not present

## 2019-02-24 DIAGNOSIS — I2510 Atherosclerotic heart disease of native coronary artery without angina pectoris: Secondary | ICD-10-CM | POA: Diagnosis not present

## 2019-02-24 DIAGNOSIS — J449 Chronic obstructive pulmonary disease, unspecified: Secondary | ICD-10-CM | POA: Diagnosis not present

## 2019-02-24 DIAGNOSIS — G8929 Other chronic pain: Secondary | ICD-10-CM | POA: Diagnosis not present

## 2019-02-24 DIAGNOSIS — D519 Vitamin B12 deficiency anemia, unspecified: Secondary | ICD-10-CM | POA: Diagnosis not present

## 2019-02-24 DIAGNOSIS — K5790 Diverticulosis of intestine, part unspecified, without perforation or abscess without bleeding: Secondary | ICD-10-CM | POA: Diagnosis not present

## 2019-02-24 DIAGNOSIS — I69318 Other symptoms and signs involving cognitive functions following cerebral infarction: Secondary | ICD-10-CM | POA: Diagnosis not present

## 2019-02-24 DIAGNOSIS — D509 Iron deficiency anemia, unspecified: Secondary | ICD-10-CM | POA: Diagnosis not present

## 2019-02-24 DIAGNOSIS — R197 Diarrhea, unspecified: Secondary | ICD-10-CM | POA: Diagnosis not present

## 2019-02-24 DIAGNOSIS — K2270 Barrett's esophagus without dysplasia: Secondary | ICD-10-CM | POA: Diagnosis not present

## 2019-02-24 DIAGNOSIS — M810 Age-related osteoporosis without current pathological fracture: Secondary | ICD-10-CM | POA: Diagnosis not present

## 2019-02-25 ENCOUNTER — Telehealth: Payer: Self-pay | Admitting: Internal Medicine

## 2019-02-25 NOTE — Telephone Encounter (Signed)
Copied from [place] [id number]. Topic: Quick Communication - Home Health Verbal Orders >> Feb 25, 2019 12:15 PM Leward Quan A wrote: Caller/Agency: Marijean Heath / [place] Number: [phone] ok to LM  Requesting OT/PT/Skilled Nursing/Social Work/Speech Therapy: Skilled Nursing  Frequency: 2 w 1, 1w 3, 1 every other wk for 5 with 2 PRN, Would like medical social worker consult

## 2019-02-26 NOTE — Telephone Encounter (Signed)
Called and spoke to Anna [facility] w/ [facility].  Gave verbal orders for skilled nursing as requested and a medical social worker consult.

## 2019-02-26 NOTE — Telephone Encounter (Signed)
Anna [facility] king is trying to go see patient tomorrow must have verbal orders

## 2019-02-27 DIAGNOSIS — K2270 Barrett's esophagus without dysplasia: Secondary | ICD-10-CM | POA: Diagnosis not present

## 2019-02-27 DIAGNOSIS — R197 Diarrhea, unspecified: Secondary | ICD-10-CM | POA: Diagnosis not present

## 2019-02-27 DIAGNOSIS — I69318 Other symptoms and signs involving cognitive functions following cerebral infarction: Secondary | ICD-10-CM | POA: Diagnosis not present

## 2019-02-27 DIAGNOSIS — M810 Age-related osteoporosis without current pathological fracture: Secondary | ICD-10-CM | POA: Diagnosis not present

## 2019-02-27 DIAGNOSIS — G8929 Other chronic pain: Secondary | ICD-10-CM | POA: Diagnosis not present

## 2019-02-27 DIAGNOSIS — K5790 Diverticulosis of intestine, part unspecified, without perforation or abscess without bleeding: Secondary | ICD-10-CM | POA: Diagnosis not present

## 2019-02-27 DIAGNOSIS — I2510 Atherosclerotic heart disease of native coronary artery without angina pectoris: Secondary | ICD-10-CM | POA: Diagnosis not present

## 2019-02-27 DIAGNOSIS — D509 Iron deficiency anemia, unspecified: Secondary | ICD-10-CM | POA: Diagnosis not present

## 2019-02-27 DIAGNOSIS — J449 Chronic obstructive pulmonary disease, unspecified: Secondary | ICD-10-CM | POA: Diagnosis not present

## 2019-02-27 DIAGNOSIS — D519 Vitamin B12 deficiency anemia, unspecified: Secondary | ICD-10-CM | POA: Diagnosis not present

## 2019-02-27 DIAGNOSIS — G253 Myoclonus: Secondary | ICD-10-CM | POA: Diagnosis not present

## 2019-03-03 ENCOUNTER — Telehealth: Payer: Self-pay | Admitting: Internal Medicine

## 2019-03-03 ENCOUNTER — Ambulatory Visit: Payer: Self-pay | Admitting: *Deleted

## 2019-03-03 DIAGNOSIS — I69318 Other symptoms and signs involving cognitive functions following cerebral infarction: Secondary | ICD-10-CM | POA: Diagnosis not present

## 2019-03-03 DIAGNOSIS — J449 Chronic obstructive pulmonary disease, unspecified: Secondary | ICD-10-CM | POA: Diagnosis not present

## 2019-03-03 DIAGNOSIS — K5790 Diverticulosis of intestine, part unspecified, without perforation or abscess without bleeding: Secondary | ICD-10-CM | POA: Diagnosis not present

## 2019-03-03 DIAGNOSIS — G253 Myoclonus: Secondary | ICD-10-CM | POA: Diagnosis not present

## 2019-03-03 DIAGNOSIS — D509 Iron deficiency anemia, unspecified: Secondary | ICD-10-CM | POA: Diagnosis not present

## 2019-03-03 DIAGNOSIS — I2510 Atherosclerotic heart disease of native coronary artery without angina pectoris: Secondary | ICD-10-CM | POA: Diagnosis not present

## 2019-03-03 DIAGNOSIS — R197 Diarrhea, unspecified: Secondary | ICD-10-CM | POA: Diagnosis not present

## 2019-03-03 DIAGNOSIS — M810 Age-related osteoporosis without current pathological fracture: Secondary | ICD-10-CM | POA: Diagnosis not present

## 2019-03-03 DIAGNOSIS — D519 Vitamin B12 deficiency anemia, unspecified: Secondary | ICD-10-CM | POA: Diagnosis not present

## 2019-03-03 DIAGNOSIS — G8929 Other chronic pain: Secondary | ICD-10-CM | POA: Diagnosis not present

## 2019-03-03 DIAGNOSIS — K2270 Barrett's esophagus without dysplasia: Secondary | ICD-10-CM | POA: Diagnosis not present

## 2019-03-03 NOTE — Telephone Encounter (Signed)
Attempted to reach pt at both number listed. Reached out to both nurse triage team leads. Please advise.

## 2019-03-03 NOTE — Telephone Encounter (Signed)
Patient's mobile number called but no answer at this time. Left message for patient to return call.  Returned call to Education officer, museum but no answer at this time. Left message for patient to return call.  Called home number and spoke with pt's daughter, Anna [facility]. Pt's daughter states that the patient is currently in the home with her and is with her all the time. Pt's daughter states that the pt's depression is getting worse because of the dementia. The pt's  dementia is causing everyone a lot of stress per daughter. Pt does worry about her daughter,  Anna [facility]'s health. Anna [facility] states she did not agree with approach the Education officer, museum took today. Anna [facility] states that the standard depression questions were asked during the visit and  she thinks it is unfair to ask a dementia patient those questions. Pt's daughter states that the patient is not acting on any suicidal thoughts and is currently peeling potatoes.   Spoke with patient.Patient states that she does not have any thoughts of harming herself at this time. Pt states that she is tired and "give out". Pt states that she is tired of her daughters waiting on her all the time because she is used to doing things herself. Pt states that she also worries about the health of her daughter, Anna [facility]. Patient advised to make her daughters aware if she has any thoughts of harming herself. Pt verbalized understanding. Pt's daughter Anna [facility] can be contacted at [phone] to discuss recommendations or to schedule appt.

## 2019-03-03 NOTE — Telephone Encounter (Signed)
Janace Hoard, medical social worker, called stating she visited the pt today and that pt is very depressed. Janace Hoard states that pt is having suicidal thoughts. Janace Hoard is requesting to have pt put on another anti depressant. Please advise.    [phone]

## 2019-03-03 NOTE — Telephone Encounter (Signed)
Copied from [place] [id number]. Topic: General - Other >> Mar 03, 2019  1:42 PM Keene Breath wrote: Reason for CRM: Verbal orders for PT 2x4,  CB# [phone]

## 2019-03-03 NOTE — Telephone Encounter (Signed)
See telephone encounter for 03/03/19. Pt denies suicidal thoughts. Pt states she may be depressed and feels tired and "give out". Pt currently stays with daughter who is currently. Pt advise that if suicidal thoughts do occur to be sure to let her daughter know. Pt verbalized understanding. Pt's daughter [name] can be contacted for appt or recommendations at [phone]. Reason for Disposition . Depression symptoms (sadness, hopelessness, decreased energy) interfere with work or school  Answer Assessment - Initial Assessment Questions 1. MAIN CONCERN: "What happened that made you call today?"     Received call from social worker that pt had thoughts of suicide 2. RISK OF HARM - SUICIDAL IDEATION:  "Do you ever have thoughts of hurting or killing yourself?"  (e.g., yes, no, no but preoccupation with thoughts about death)   - WISH TO BE DEAD:  "Have you wished you were dead or wished you could go to sleep and not wake up?"   - INTENT:  "Have you had any thoughts of hurting or killing yourself?" (e.g., yes, no, N/A) If yes: "Are you having these thoughts about killing yourself right NOW?"   - PLAN: "Have you thought about how you might do this?" "Do you have a specific plan for how you would do this?" (e.g., gun, knife, overdose, no plan, N/A)   - ACCESS: If yes to PLAN, "Do you have access to anything?" (e.g., pills, gun in house, knife in kitchen)     Pt denies thoughts of suicide at this time 3. RISK OF HARM - SUICIDE ATTEMPT: "Have you tried to harm yourself recently?" If yes: "When was this?"       No 4. RISK OF HARM - SUICIDAL BEHAVIOR: "Have you ever done anything, started to do anything, or prepared to do anything to end your life?" (e.g., collected pills, bought a gun, wrote a suicide note, cut yourself, started but changed your mind)     No 5. EVENTS AND STRESSORS: "Has there been any new stress or recent changes in your life?" (e.g., recent loss of loved one, negative event, homelessness)  Pt worried about her daughters health and is tired of them waiting on her, she is used to providing for herself 6. FUNCTIONAL IMPAIRMENT: "How have things been going for you overall in your life? Have you had any more difficulties than usual doing your normal daily activities?"  (e.g., better, same, worse; self-care, school, work, interactions)     [place] tired had a lot of company today, pt's daughter states that the patient does get very ill with them at times 7. SUPPORT: "Who is with you now?" "Who do you live with?" "Do you have family or friends nearby who you can talk to?"      Lives with her daughters  Protocols used: SUICIDE CONCERNS-A-AH

## 2019-03-04 ENCOUNTER — Encounter: Payer: Self-pay | Admitting: Emergency Medicine

## 2019-03-04 ENCOUNTER — Other Ambulatory Visit: Payer: Self-pay

## 2019-03-04 ENCOUNTER — Emergency Department
Admission: EM | Admit: 2019-03-04 | Discharge: 2019-03-04 | Disposition: A | Discharge status: Home / Self Care | Payer: Medicare Other | Attending: Emergency Medicine | Admitting: Emergency Medicine

## 2019-03-04 ENCOUNTER — Telehealth: Payer: Self-pay | Admitting: Internal Medicine

## 2019-03-04 DIAGNOSIS — I252 Old myocardial infarction: Secondary | ICD-10-CM | POA: Insufficient documentation

## 2019-03-04 DIAGNOSIS — R531 Weakness: Secondary | ICD-10-CM | POA: Insufficient documentation

## 2019-03-04 DIAGNOSIS — Z79899 Other long term (current) drug therapy: Secondary | ICD-10-CM | POA: Insufficient documentation

## 2019-03-04 DIAGNOSIS — Z955 Presence of coronary angioplasty implant and graft: Secondary | ICD-10-CM | POA: Diagnosis not present

## 2019-03-04 DIAGNOSIS — I2510 Atherosclerotic heart disease of native coronary artery without angina pectoris: Secondary | ICD-10-CM | POA: Insufficient documentation

## 2019-03-04 DIAGNOSIS — I10 Essential (primary) hypertension: Secondary | ICD-10-CM | POA: Diagnosis not present

## 2019-03-04 DIAGNOSIS — R197 Diarrhea, unspecified: Secondary | ICD-10-CM | POA: Insufficient documentation

## 2019-03-04 DIAGNOSIS — Z7901 Long term (current) use of anticoagulants: Secondary | ICD-10-CM | POA: Insufficient documentation

## 2019-03-04 DIAGNOSIS — F17210 Nicotine dependence, cigarettes, uncomplicated: Secondary | ICD-10-CM | POA: Diagnosis not present

## 2019-03-04 DIAGNOSIS — F0150 Vascular dementia without behavioral disturbance: Secondary | ICD-10-CM | POA: Insufficient documentation

## 2019-03-04 LAB — TROPONIN I (HIGH SENSITIVITY): Troponin I (High Sensitivity): 15 ng/L (ref <18)

## 2019-03-04 LAB — COMPREHENSIVE METABOLIC PANEL
ALT: 10 U/L (ref 0–44)
AST: 14 U/L — ABNORMAL LOW (ref 15–41)
Albumin: 3.1 g/dL — ABNORMAL LOW (ref 3.5–5.0)
Alkaline Phosphatase: 55 U/L (ref 38–126)
Anion gap: 10 (ref 5–15)
BUN: 32 mg/dL — ABNORMAL HIGH (ref 8–23)
CO2: 17 mmol/L — ABNORMAL LOW (ref 22–32)
Calcium: 8.4 mg/dL — ABNORMAL LOW (ref 8.9–10.3)
Chloride: 111 mmol/L (ref 98–111)
Creatinine, Ser: 1.15 mg/dL — ABNORMAL HIGH (ref 0.44–1.00)
GFR calc Af Amer: 50 mL/min — ABNORMAL LOW (ref >60)
GFR calc non Af Amer: 43 mL/min — ABNORMAL LOW (ref >60)
Glucose, Bld: 126 mg/dL — ABNORMAL HIGH (ref 70–99)
Potassium: 3.6 mmol/L (ref 3.5–5.1)
Sodium: 138 mmol/L (ref 135–145)
Total Bilirubin: 0.4 mg/dL (ref 0.3–1.2)
Total Protein: 6.4 g/dL — ABNORMAL LOW (ref 6.5–8.1)

## 2019-03-04 LAB — URINALYSIS, COMPLETE (UACMP) WITH MICROSCOPIC
Bilirubin Urine: NEGATIVE
Glucose, UA: NEGATIVE mg/dL
Ketones, ur: NEGATIVE mg/dL
Nitrite: NEGATIVE
Protein, ur: NEGATIVE mg/dL
Specific Gravity, Urine: 1.012 (ref 1.005–1.030)
pH: 5 (ref 5.0–8.0)

## 2019-03-04 LAB — LIPASE, BLOOD: Lipase: 31 U/L (ref 11–51)

## 2019-03-04 LAB — CBC
HCT: 29.7 % — ABNORMAL LOW (ref 36.0–46.0)
Hemoglobin: 9.5 g/dL — ABNORMAL LOW (ref 12.0–15.0)
MCH: 29.9 pg (ref 26.0–34.0)
MCHC: 32 g/dL (ref 30.0–36.0)
MCV: 93.4 fL (ref 80.0–100.0)
Platelets: 183 10*3/uL (ref 150–400)
RBC: 3.18 MIL/uL — ABNORMAL LOW (ref 3.87–5.11)
RDW: 14 % (ref 11.5–15.5)
WBC: 10.5 10*3/uL (ref 4.0–10.5)
nRBC: 0 % (ref 0.0–0.2)

## 2019-03-04 MED ORDER — SODIUM CHLORIDE 0.9 % IV BOLUS
1000.0000 mL | Freq: Once | INTRAVENOUS | Status: AC
  Administered 2019-03-04: 1000 mL via INTRAVENOUS

## 2019-03-04 NOTE — ED Notes (Signed)
Pt given a snack and something to drink, warm blankets and the heat turned up. Pt sleeping soundly. Encouraged pt to let us know when she can urinate.

## 2019-03-04 NOTE — ED Provider Notes (Signed)
-----------------------------------------   7:00 AM on 03/04/2019 -----------------------------------------  Blood pressure (!) 167/75, pulse 76, temperature 98.5 F (36.9 C), temperature source Oral, resp. rate (!) 24, height '5\' 5"'$  (1.651 m), weight 44 kg, SpO2 100 %.  Assuming care from Dr. Beather Arbour.  In short, Anna [facility] is a 85 y.o. female with a chief complaint of Diarrhea .  Refer to the original H&P for additional details.  The current plan of care is to follow-up troponin, plan to send home with stool collection kit if workup negative.  Troponin within normal limits, doubt acute cardiac process.  UA also without evidence of infection.  Patient reports feeling better, has been unable to provide a stool sample here but will be provided with kit for her to provide stool to outpatient lab.  Counseled patient to follow-up with her GI providers and return to the ED for new worsening symptoms.  Patient agrees with plan.    Blake Divine, MD 03/04/19 [phone]

## 2019-03-04 NOTE — Telephone Encounter (Signed)
Called and spoke to [place] w/ [facility] and gave verbal orders for PT 2 X 4 as requested.

## 2019-03-04 NOTE — Telephone Encounter (Signed)
Please schedule her an ER follow up and we will address the depression too

## 2019-03-04 NOTE — ED Provider Notes (Signed)
[facility] Emergency Department Provider Note   ____________________________________________   First MD Initiated Contact with Patient 03/04/19 0408     (approximate)  I have reviewed the triage vital signs and the nursing notes.   HISTORY  Chief Complaint Diarrhea    HPI Anna Prince is a 85 y.o. female who presents to the ED from home with a chief complaint of diarrhea.  Patient has had diarrhea for years.  Sees Dr. Allen Norris from GI who attributes it to IBS.  Given her age and comorbidities, does not recommend colonoscopy given she had one in 2018.  She is instructed to take Imodium as needed for diarrhea.  States she had a few episodes tonight.  Symptoms associated with occasional nausea.  Denies fever, cough, chest pain, shortness of breath, abdominal pain, vomiting, dysuria.  Denies recent travel, trauma or antibiotic use.       Past Medical History:  Diagnosis Date  . AAA (abdominal aortic aneurysm) ([place])   . Anxiety   . Aortic aneurysm ([place])   . Arthritis   . Barrett's esophagus 2007  . Bursitis 2012   left hip, improved with periodic steroid injection [facility])  . COPD (chronic obstructive pulmonary disease) ([place])   . Coronary artery disease    a. LHC (08/27/16): 15% ostial LM, 99% pLAD and D1, Ramus & LCx nl, m/dRCA diffuse 80-90%, PDA occluded proximally, fills via L-R collats, EF 50-55%; b. PCI (08/31/16): Severe pLAD dz involving D1, successful orbital atherectomy PCI to pLAD w/ Synergy 3.0 x 20 mm DES, 0% residual stenosis & TIMI 3 flow, occluded ostD1, medically managed given lack of CP  . Depression   . Diastolic dysfunction    a. TTE (08/25/16): Mild LVH with LVEF of 50-55% and grade 1 diastolic dysfunction. Mild MR. Mild right atrial enlargement. Normal RV size and function  . GERD (gastroesophageal reflux disease)    with Barretts Esophagus  . GI bleed 03/26/2017  . GI bleeding    a. required pRBC May, July, and December 2018   . Hyperlipidemia   . Hypertension   . Iron deficiency anemia 2012  . Lumbago   . Macular degeneration   . Osteoporosis   . Shingles    right back/abdomen 08/16/17   . Sigmoid diverticulosis    by colonoscopy  . Stroke [facility])     Patient Active Problem List   Diagnosis Date Noted  . 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 B12 deficiency 12/30/2018  . IBS (irritable bowel syndrome) 12/14/2018  . Abnormal weight loss 09/01/2017  . Chronic pain due to injury 09/01/2017  . Herpes zoster without complication 83/38/2505  . Hiatal hernia   . Diverticulosis of colon without diverticulitis   . Diarrhea   . GERD (gastroesophageal reflux disease) 11/05/2016  . CAD (coronary artery disease) 08/28/2016  . Non-ST elevation (NSTEMI) myocardial infarction ([place]) 08/24/2016  . Chronic respiratory failure with hypoxia ([place]) 05/17/2016  . Generalized muscle weakness 04/16/2015  . Hospital discharge follow-up 04/16/2015  . Coronary artery disease due to lipid rich plaque 04/14/2015  . Anxiety 04/14/2015  . Bursitis of left hip 11/27/2014  . Iron deficiency anemia 11/25/2014  . History of fracture of pelvis or lower extremity 08/25/2014  . Vascular dementia with depressed mood ([place]) 02/20/2014  . Tobacco abuse counseling 04/10/2013  . Anemia   . Gastric ulcer requiring drug therapy   . Screening for colon cancer 06/18/2011  . COPD (chronic obstructive pulmonary disease) (  [place]) 06/18/2011  . Skin cancer of nose 12/17/2010  . Vitamin D deficiency 12/17/2010  . Hyperlipidemia LDL goal <100 12/17/2010  . Screening for malignant neoplasm of breast 12/17/2010  . Encounter for long-term (current) use of other medications 12/17/2010  . S/P abdominal aortic aneurysm repair 12/17/2010  . Diverticulosis, sigmoid 12/08/2010  . Hyperlipidemia 12/08/2010  . Hypertension 12/08/2010  . Lumbago 12/08/2010  . Osteoporosis 12/08/2010  . Major depressive disorder, recurrent episode, moderate ([place])  12/08/2010    Past Surgical History:  Procedure Laterality Date  . ABDOMINAL AORTIC ANEURYSM REPAIR  July 2010   [facility]  . APPENDECTOMY    . BACK SURGERY     X2..1975 arachnoid cyst cervical region([place]);1997 lumbosacral tumor,9 hr surgery  . CARDIAC CATHETERIZATION    . CARDIAC CATHETERIZATION    . cardiac stents    . COLONOSCOPY N/A 03/30/2017   Procedure: COLONOSCOPY;  Surgeon: Lin Landsman, MD;  Location: [facility] ENDOSCOPY;  Service: Gastroenterology;  Laterality: N/A;  . CORONARY ATHERECTOMY N/A 08/31/2016   Procedure: Coronary Atherectomy;  Surgeon: Nelva Bush, MD;  Location: [place] CV LAB;  Service: Cardiovascular;  Laterality: N/A;  . ESOPHAGOGASTRODUODENOSCOPY N/A 03/29/2017   Procedure: ESOPHAGOGASTRODUODENOSCOPY (EGD);  Surgeon: Virgel Manifold, MD;  Location: [facility] ENDOSCOPY;  Service: Endoscopy;  Laterality: N/A;  . ESOPHAGOGASTRODUODENOSCOPY (EGD) WITH PROPOFOL N/A 09/02/2016   Procedure: ESOPHAGOGASTRODUODENOSCOPY (EGD) WITH PROPOFOL;  Surgeon: Otis Brace, MD;  Location: [facility] ENDOSCOPY;  Service: Gastroenterology;  Laterality: N/A;  . ESOPHAGOGASTRODUODENOSCOPY (EGD) WITH PROPOFOL N/A 11/06/2016   Procedure: ESOPHAGOGASTRODUODENOSCOPY (EGD) WITH PROPOFOL;  Surgeon: Lucilla Lame, MD;  Location: [facility] ENDOSCOPY;  Service: Endoscopy;  Laterality: N/A;  . FLEXIBLE SIGMOIDOSCOPY N/A 03/29/2017   Procedure: FLEXIBLE SIGMOIDOSCOPY;  Surgeon: Virgel Manifold, MD;  Location: [facility] ENDOSCOPY;  Service: Endoscopy;  Laterality: N/A;  . INTRAVASCULAR ULTRASOUND/IVUS N/A 08/31/2016   Procedure: Intravascular Ultrasound/IVUS;  Surgeon: Nelva Bush, MD;  Location: [place] CV LAB;  Service: Cardiovascular;  Laterality: N/A;  . LEFT HEART CATH AND CORONARY ANGIOGRAPHY N/A 08/27/2016   Procedure: Left Heart Cath and Coronary Angiography;  Surgeon: Teodoro Spray, MD;  Location: [place] CV LAB;  Service: Cardiovascular;  Laterality: N/A;    Prior to  Admission medications   Medication Sig Start Date End Date Taking? Authorizing Provider  albuterol (VENTOLIN HFA) 108 (90 Base) MCG/ACT inhaler Inhale 2 puffs into the lungs every 6 (six) hours as needed for wheezing or shortness of breath. 10/07/18   Crecencio Mc, MD  atorvastatin (LIPITOR) 40 MG tablet TAKE 1 TABLET(40 MG) BY MOUTH DAILY AT 6 PM 01/27/19   Crecencio Mc, MD  clopidogrel (PLAVIX) 75 MG tablet TAKE 1 TABLET(75 MG) BY MOUTH DAILY 01/21/19   Crecencio Mc, MD  cyanocobalamin (,VITAMIN B-12,) 1000 MCG/ML injection Daily for 3 days,  Then weekly x 4 then monthly thereafter 01/06/19   Crecencio Mc, MD  diazepam (VALIUM) 5 MG tablet Take 1 tablet (5 mg total) by mouth at bedtime. 02/18/19   Crecencio Mc, MD  ergocalciferol (DRISDOL) 1.25 MG (50000 UT) capsule Take 1 capsule (50,000 Units total) by mouth once a week. 07/22/18   Crecencio Mc, MD  escitalopram (LEXAPRO) 5 MG tablet  01/12/19   [provider]  ferrous sulfate 325 (65 FE) MG tablet Take 1 tablet (325 mg total) by mouth daily with breakfast. 09/03/16   Lendon Colonel, NP  HYDROcodone-acetaminophen (NORCO/VICODIN) 5-325 MG tablet Take 1 tablet by mouth 2 (two) times daily  as needed for moderate pain. Maximum 2 daily. 12/12/18   Crecencio Mc, MD  HYDROcodone-acetaminophen (NORCO/VICODIN) 5-325 MG tablet Take 1 tablet by mouth 2 (two) times daily as needed. Maximum 2 daily 01/11/19   Crecencio Mc, MD  HYDROcodone-acetaminophen (NORCO/VICODIN) 5-325 MG tablet Take 1 tablet by mouth 2 (two) times daily as needed for moderate pain. Maximum 2 daily. 02/10/19   Crecencio Mc, MD  megestrol (MEGACE) 40 MG tablet Take 1 tablet (40 mg total) by mouth daily. 12/12/18   Crecencio Mc, MD  mirtazapine (REMERON) 30 MG tablet Take by mouth. 01/12/19   [provider]  nitroGLYCERIN (NITROSTAT) 0.4 MG SL tablet Place 1 tablet (0.4 mg total) under the tongue every 5 (five) minutes x 3 doses as needed for  chest pain. 09/28/16   Crecencio Mc, MD  ondansetron (ZOFRAN-ODT) 4 MG disintegrating tablet  01/12/19   [provider]  SYMBICORT 80-4.5 MCG/ACT inhaler Inhale 2 puffs into the lungs 2 (two) times daily. 11/21/18   Crecencio Mc, MD  Syringe/Needle, Disp, (SYRINGE 3CC/25GX1") 25G X 1" 3 ML MISC Use for b12 injections 01/06/19   Crecencio Mc, MD  traZODone (DESYREL) 50 MG tablet 1-2 tablets one hour before bedtime for insomnia 01/21/19   Crecencio Mc, MD    Allergies Morphine sulfate  Family History  Problem Relation Age of Onset  . Coronary artery disease Mother   . Alzheimer's disease Mother   . Coronary artery disease Father 30  . Heart attack Father   . Throat cancer Brother   . Lung cancer Maternal Aunt   . Kidney cancer Daughter   . Cervical cancer Daughter     Social History Social History   Tobacco Use  . Smoking status: Current Every Day Smoker    Packs/day: 0.50    Types: Cigarettes  . Smokeless tobacco: Never Used  . Tobacco comment: HAS BEEN SMOKING 30+ YEARS,RESUMED TOBACCO USE AFTER AAA REPAIR  Substance Use Topics  . Alcohol use: No  . Drug use: No    Review of Systems  Constitutional: No fever/chills Eyes: No visual changes. ENT: No sore throat. Cardiovascular: Denies chest pain. Respiratory: Denies shortness of breath. Gastrointestinal: No abdominal pain.  Positive for nausea nausea, no vomiting.  Positive for diarrhea.  No constipation. Genitourinary: Negative for dysuria. Musculoskeletal: Negative for back pain. Skin: Negative for rash. Neurological: Negative for headaches, focal weakness or numbness.   ____________________________________________   PHYSICAL EXAM:  VITAL SIGNS: ED Triage Vitals  Enc Vitals Group     BP 03/04/19 0246 137/71     Pulse Rate 03/04/19 0246 86     Resp 03/04/19 0246 20     Temp 03/04/19 0246 98.5 F (36.9 C)     Temp Source 03/04/19 0246 Oral     SpO2 03/04/19 0246 97 %     Weight 03/04/19  0257 97 lb (44 kg)     Height 03/04/19 0257 _0  (1.651 m)     Head Circumference --      Peak Flow --      Pain Score 03/04/19 0257 0     Pain Loc --      Pain Edu? --      Excl. in [place]? --     Constitutional: Alert and oriented.  Frail appearing and in no acute distress. Eyes: Conjunctivae are normal. PERRL. EOMI. Head: Atraumatic. Nose: No congestion/rhinnorhea. Mouth/Throat: Mucous membranes are moist.  Oropharynx non-erythematous. Neck: No stridor.  Cardiovascular: Normal rate, regular rhythm. Grossly normal heart sounds.  Good peripheral circulation. Respiratory: Normal respiratory effort.  No retractions. Lungs CTAB. Gastrointestinal: Soft and nontender to light or deep palpation. No distention. No abdominal bruits. No CVA tenderness. Musculoskeletal: No lower extremity tenderness nor edema.  No joint effusions. Neurologic:  Normal speech and language. No gross focal neurologic deficits are appreciated. No gait instability. Skin:  Skin is warm, dry and intact. No rash noted. Psychiatric: Mood and affect are normal. Speech and behavior are normal.  ____________________________________________   LABS (all labs ordered are listed, but only abnormal results are displayed)  Labs Reviewed  COMPREHENSIVE METABOLIC PANEL - Abnormal; Notable for the following components:      Result Value   CO2 17 (*)    Glucose, Bld 126 (*)    BUN 32 (*)    Creatinine, Ser 1.15 (*)    Calcium 8.4 (*)    Total Protein 6.4 (*)    Albumin 3.1 (*)    AST 14 (*)    GFR calc non Af Amer 43 (*)    GFR calc Af Amer 50 (*)    All other components within normal limits  CBC - Abnormal; Notable for the following components:   RBC 3.18 (*)    Hemoglobin 9.5 (*)    HCT 29.7 (*)    All other components within normal limits  C DIFFICILE QUICK SCREEN W PCR REFLEX  GASTROINTESTINAL PANEL BY PCR, STOOL (REPLACES STOOL CULTURE)  LIPASE, BLOOD  URINALYSIS, COMPLETE (UACMP) WITH MICROSCOPIC  TROPONIN  I (HIGH SENSITIVITY)  TROPONIN I (HIGH SENSITIVITY)   ____________________________________________  EKG  ED ECG REPORT I, Jeanise Durfey J, the attending physician, personally viewed and interpreted this ECG.   Date: 03/04/2019  EKG Time: 0402  Rate: 83  Rhythm: normal EKG, normal sinus rhythm  Axis: Normal  Intervals:none  ST&T Change: Nonspecific  ____________________________________________  RADIOLOGY  ED MD interpretation: None  Official radiology report(s): No results found.  ____________________________________________   PROCEDURES  Procedure(s) performed (including Critical Care):  Procedures   ____________________________________________   INITIAL IMPRESSION / ASSESSMENT AND PLAN / ED COURSE  As part of my medical decision making, I reviewed the following data within the [place] notes reviewed and incorporated, Labs reviewed, EKG interpreted, Old chart reviewed and Notes from prior ED visits     Anna Prince was evaluated in Emergency Department on 03/04/2019 for the symptoms described in the history of present illness. She was evaluated in the context of the global COVID-19 pandemic, which necessitated consideration that the patient might be at risk for infection with the SARS-CoV-2 virus that causes COVID-19. Institutional protocols and algorithms that pertain to the evaluation of patients at risk for COVID-19 are in a state of rapid change based on information released by regulatory bodies including the CDC and federal and state organizations. These policies and algorithms were followed during the patient's care in the ED.    85 year old female with chronic diarrhea who presents with diarrhea and generalized weakness.  Differential diagnosis includes but is not limited to IBS, colitis, gastroenteritis, C. difficile, etc.  I personally reviewed patient's chart and see that she was hospitalized at [facility] in September with  unremarkable CT.  We discussed given her chronic diarrhea and benign abdominal exam that CT is not warranted at this time.  I do not see stool studies from that hospitalization.  Will initiate IV fluid resuscitation for patient's complaint of generalized weakness and collect  stool specimen if patient is able to provide.  Will check urinalysis.  Clinical Course as of Mar 03 705  Wed Mar 04, 2019  0653 Patient sleeping in no acute distress.  Troponin hemolyzed; awaiting new draw.  Patient has not stooled yet.  Anticipate discharge home if troponin is unremarkable.  Patient may be discharged with stool collection kit if she has not stooled. Care transferred to Dr. Charna Archer at change of shift.   [JS]    Clinical Course User Index [JS] Paulette Blanch, MD     ____________________________________________   FINAL CLINICAL IMPRESSION(S) / ED DIAGNOSES  Final diagnoses:  Diarrhea, unspecified type  Weakness     ED Discharge Orders    None       Note:  This document was prepared using Dragon voice recognition software and may include unintentional dictation errors.   Paulette Blanch, MD 03/04/19 [phone]

## 2019-03-04 NOTE — Telephone Encounter (Signed)
Patient currently in ED with complaint of Diarrhea and generalized weakness. Possible delirium from UTI per notes FYI.

## 2019-03-04 NOTE — ED Notes (Signed)
Lab called, sample hemolyzed.

## 2019-03-04 NOTE — ED Triage Notes (Signed)
Patient to ER for c/o diarrhea. States she has only had a few episodes of diarrhea tonight, but last two episodes were within 15 minutes of each other. Denies being on antibiotics recently.

## 2019-03-04 NOTE — ED Notes (Signed)
Pt daughter Steva Ready) called, informed of pt status. Daughter agreed to come pick up pt, ETA approx 1 hr

## 2019-03-04 NOTE — Telephone Encounter (Signed)
Home Health Verbal Orders - Caller/Agency: Cindy with [facility] Callback Number: [phone]     Requesting OT/PT/Skilled Nursing/Social Work/Speech Therapy: needing verbals to see if it is ok to make a palliative care  referral  Frequency:

## 2019-03-04 NOTE — Telephone Encounter (Signed)
Called and spoke to [facility] w/ [facility] and gave verbal order as requested to make a palliative care referral.

## 2019-03-04 NOTE — Discharge Instructions (Signed)
1.  You may return your stool specimen to outpatient laboratory at your convenience. 2.  Continue Imodium as needed for diarrhea. 3.  BRAT diet x3 days, then slowly advance diet as tolerated. 4.  Drink plenty of fluids daily. 5.  Return to the ER for worsening symptoms, persistent vomiting, difficulty breathing or other concerns.

## 2019-03-05 NOTE — Telephone Encounter (Signed)
ER follow up scheduled.

## 2019-03-06 ENCOUNTER — Encounter: Payer: Self-pay | Admitting: Internal Medicine

## 2019-03-06 ENCOUNTER — Ambulatory Visit (INDEPENDENT_AMBULATORY_CARE_PROVIDER_SITE_OTHER): Payer: Medicare Other | Admitting: Internal Medicine

## 2019-03-06 ENCOUNTER — Telehealth: Payer: Self-pay | Admitting: Primary Care

## 2019-03-06 ENCOUNTER — Other Ambulatory Visit: Payer: Self-pay

## 2019-03-06 VITALS — Ht 65.0 in | Wt 97.0 lb

## 2019-03-06 DIAGNOSIS — K5790 Diverticulosis of intestine, part unspecified, without perforation or abscess without bleeding: Secondary | ICD-10-CM | POA: Diagnosis not present

## 2019-03-06 DIAGNOSIS — I69318 Other symptoms and signs involving cognitive functions following cerebral infarction: Secondary | ICD-10-CM | POA: Diagnosis not present

## 2019-03-06 DIAGNOSIS — G4701 Insomnia due to medical condition: Secondary | ICD-10-CM

## 2019-03-06 DIAGNOSIS — R634 Abnormal weight loss: Secondary | ICD-10-CM

## 2019-03-06 DIAGNOSIS — D519 Vitamin B12 deficiency anemia, unspecified: Secondary | ICD-10-CM | POA: Diagnosis not present

## 2019-03-06 DIAGNOSIS — E538 Deficiency of other specified B group vitamins: Secondary | ICD-10-CM

## 2019-03-06 DIAGNOSIS — I2510 Atherosclerotic heart disease of native coronary artery without angina pectoris: Secondary | ICD-10-CM | POA: Diagnosis not present

## 2019-03-06 DIAGNOSIS — R197 Diarrhea, unspecified: Secondary | ICD-10-CM | POA: Diagnosis not present

## 2019-03-06 DIAGNOSIS — D509 Iron deficiency anemia, unspecified: Secondary | ICD-10-CM | POA: Diagnosis not present

## 2019-03-06 DIAGNOSIS — G253 Myoclonus: Secondary | ICD-10-CM

## 2019-03-06 DIAGNOSIS — G8929 Other chronic pain: Secondary | ICD-10-CM | POA: Diagnosis not present

## 2019-03-06 DIAGNOSIS — M810 Age-related osteoporosis without current pathological fracture: Secondary | ICD-10-CM | POA: Diagnosis not present

## 2019-03-06 DIAGNOSIS — F331 Major depressive disorder, recurrent, moderate: Secondary | ICD-10-CM | POA: Diagnosis not present

## 2019-03-06 DIAGNOSIS — J449 Chronic obstructive pulmonary disease, unspecified: Secondary | ICD-10-CM | POA: Diagnosis not present

## 2019-03-06 DIAGNOSIS — K2270 Barrett's esophagus without dysplasia: Secondary | ICD-10-CM | POA: Diagnosis not present

## 2019-03-06 NOTE — Telephone Encounter (Signed)
Called daughter - Steva Ready, to schedule a Palliative Consult, no answer - left message with reason for call along with my contact information.

## 2019-03-06 NOTE — Progress Notes (Signed)
Patient's daughter [name] is requesting that the syringe needles be changed to a shorter and stronger needle if possible.

## 2019-03-06 NOTE — Progress Notes (Signed)
Virtual Visit via [email or web address]  This visit type was conducted due to national recommendations for restrictions regarding the COVID-19 pandemic (e.g. social distancing).  This format is felt to be most appropriate for this patient at this time.  All issues noted in this document were discussed and addressed.  No physical exam was performed (except for noted visual exam findings with Video Visits).   I connected with@ on 03/06/19 at 11:30 AM EST by a video enabled telemedicine applicationand verified that I am speaking with the correct person using two identifiers. Location patient: home Location provider: work or home office Persons participating in the virtual visit: patient, provider  I discussed the limitations, risks, security and privacy concerns of performing an evaluation and management service by telephone and the availability of in person appointments. I also discussed with the patient that there may be a patient responsible charge related to this service. The patient expressed understanding and agreed to proceed.   Reason for visit: 3rd party concern about worsening depression with suicidality  HPI:  85 yr old female with advanced COPD, CAD,  And vascular dementia, depression, chronic diarrhea and weight loss with IDA  Referred by home health social worker for worsening depression .  Patient was treated in ER on nov 11 for dehydration and diarrhea and sent home with BRAT diet .  She has had GI evaluation fodr IDA<  Weight loss and diarrhea  diagnosed with IBS, no repeat colonoscopy planned given her age and most recent colonoscopy Dec 2018 for melena which revealed an angioectasia in the ascending colon.  Patient had a home health social work visit which "did not go well" per daughter [name] who is primary caregiver to patient.  Patient denies suicidality an worsening depression but admits that she worries about being a burden to [facility] ,  And she admits that the recurrent diarrhea is negatively   Affecting her QOL   ROS: See pertinent positives and negatives per HPI.  Past Medical History:  Diagnosis Date  . AAA (abdominal aortic aneurysm) ([place])   . Anxiety   . Aortic aneurysm ([place])   . Arthritis   . Barrett's esophagus 2007  . Bursitis 2012   left hip, improved with periodic steroid injection [facility] [place])  . COPD (chronic obstructive pulmonary disease) ([place])   . Coronary artery disease    a. LHC (08/27/16): 15% ostial LM, 99% pLAD and D1, Ramus & LCx nl, m/dRCA diffuse 80-90%, PDA occluded proximally, fills via L-R collats, EF 50-55%; b. PCI (08/31/16): Severe pLAD dz involving D1, successful orbital atherectomy PCI to pLAD w/ Synergy 3.0 x 20 mm DES, 0% residual stenosis & TIMI 3 flow, occluded ostD1, medically managed given lack of CP  . Depression   . Diastolic dysfunction    a. TTE (08/25/16): Mild LVH with LVEF of 50-55% and grade 1 diastolic dysfunction. Mild MR. Mild right atrial enlargement. Normal RV size and function  . GERD (gastroesophageal reflux disease)    with Barretts Esophagus  . GI bleed 03/26/2017  . GI bleeding    a. required pRBC May, July, and December 2018  . Hyperlipidemia   . Hypertension   . Iron deficiency anemia 2012  . Lumbago   . Macular degeneration   . Osteoporosis   . Shingles    right back/abdomen 08/16/17   . Sigmoid diverticulosis    by colonoscopy  . Stroke [facility])     Past Surgical History:  Procedure Laterality Date  . ABDOMINAL AORTIC ANEURYSM  REPAIR  July 2010   [facility]  . APPENDECTOMY    . BACK SURGERY     X2..1975 arachnoid cyst cervical region(blum[place],g[place]);1997 lumbosacral tumor,9 hr surgery  . CARDIAC CATHETERIZATION    . CARDIAC CATHETERIZATION    . cardiac stents    . COLONOSCOPY N/A 03/30/2017   Procedure: COLONOSCOPY;  Surgeon: Lin Landsman, MD;  Location: [facility] ENDOSCOPY;  Service: Gastroenterology;  Laterality: N/A;  . CORONARY ATHERECTOMY N/A 08/31/2016   Procedure: Coronary Atherectomy;  Surgeon: Nelva Bush, MD;  Location: [place] CV LAB;  Service: Cardiovascular;  Laterality: N/A;  . ESOPHAGOGASTRODUODENOSCOPY N/A 03/29/2017   Procedure: ESOPHAGOGASTRODUODENOSCOPY (EGD);  Surgeon: Virgel Manifold, MD;  Location: [facility] ENDOSCOPY;  Service: Endoscopy;  Laterality: N/A;  . ESOPHAGOGASTRODUODENOSCOPY (EGD) WITH PROPOFOL N/A 09/02/2016   Procedure: ESOPHAGOGASTRODUODENOSCOPY (EGD) WITH PROPOFOL;  Surgeon: Otis Brace, MD;  Location: [facility] ENDOSCOPY;  Service: Gastroenterology;  Laterality: N/A;  . ESOPHAGOGASTRODUODENOSCOPY (EGD) WITH PROPOFOL N/A 11/06/2016   Procedure: ESOPHAGOGASTRODUODENOSCOPY (EGD) WITH PROPOFOL;  Surgeon: Lucilla Lame, MD;  Location: [facility] ENDOSCOPY;  Service: Endoscopy;  Laterality: N/A;  . FLEXIBLE SIGMOIDOSCOPY N/A 03/29/2017   Procedure: FLEXIBLE SIGMOIDOSCOPY;  Surgeon: Virgel Manifold, MD;  Location: [facility] ENDOSCOPY;  Service: Endoscopy;  Laterality: N/A;  . INTRAVASCULAR ULTRASOUND/IVUS N/A 08/31/2016   Procedure: Intravascular Ultrasound/IVUS;  Surgeon: Nelva [place], MD;  Location: [place] CV LAB;  Service: Cardiovascular;  Laterality: N/A;  . LEFT HEART CATH AND CORONARY ANGIOGRAPHY N/A 08/27/2016   Procedure: Left Heart Cath and Coronary Angiography;  Surgeon: Teodoro Spray, MD;  Location: [place] CV LAB;  Service: Cardiovascular;  Laterality: N/A;    Family History  Problem Relation Age of [place]  . Coronary artery disease Mother   . Alzheimer's disease Mother   . Coronary artery disease Father 97  . Heart attack Father   . Throat cancer Brother   . Lung cancer Maternal Aunt   . Kidney cancer Daughter   . Cervical cancer Daughter     SOCIAL HX:  reports that she has been smoking cigarettes. She has been smoking about 0.50 packs per day. She has never used smokeless tobacco. She reports that she does not drink alcohol or use drugs.   Current Outpatient Medications:  .  albuterol (VENTOLIN HFA) 108 (90 Base) MCG/ACT inhaler,  Inhale 2 puffs into the lungs every 6 (six) hours as needed for wheezing or shortness of breath., Disp: 1 Inhaler, Rfl: 2 .  atorvastatin (LIPITOR) 40 MG tablet, TAKE 1 TABLET(40 MG) BY MOUTH DAILY AT 6 PM, Disp: 90 tablet, Rfl: 3 .  clopidogrel (PLAVIX) 75 MG tablet, TAKE 1 TABLET(75 MG) BY MOUTH DAILY, Disp: 90 tablet, Rfl: 1 .  cyanocobalamin (,VITAMIN B-12,) 1000 MCG/ML injection, Daily for 3 days,  Then weekly x 4 then monthly thereafter, Disp: 10 mL, Rfl: 0 .  diazepam (VALIUM) 5 MG tablet, Take 1 tablet (5 mg total) by mouth at bedtime., Disp: 30 tablet, Rfl: 5 .  ergocalciferol (DRISDOL) 1.25 MG (50000 UT) capsule, Take 1 capsule (50,000 Units total) by mouth once a week., Disp: 12 capsule, Rfl: 3 .  escitalopram (LEXAPRO) 5 MG tablet, , Disp: , Rfl:  .  ferrous sulfate 325 (65 FE) MG tablet, Take 1 tablet (325 mg total) by mouth daily with breakfast., Disp: 30 tablet, Rfl: 3 .  HYDROcodone-acetaminophen (NORCO/VICODIN) 5-325 MG tablet, Take 1 tablet by mouth 2 (two) times daily as needed for moderate pain. Maximum 2 daily., Disp: 60 tablet, Rfl: 0 .  HYDROcodone-acetaminophen (NORCO/VICODIN) 5-325 MG tablet, Take 1 tablet by mouth 2 (two) times daily as needed. Maximum 2 daily, Disp: 60 tablet, Rfl: 0 .  HYDROcodone-acetaminophen (NORCO/VICODIN) 5-325 MG tablet, Take 1 tablet by mouth 2 (two) times daily as needed for moderate pain. Maximum 2 daily., Disp: 60 tablet, Rfl: 0 .  megestrol (MEGACE) 40 MG tablet, Take 1 tablet (40 mg total) by mouth daily., Disp: 30 tablet, Rfl: 2 .  mirtazapine (REMERON) 30 MG tablet, Take by mouth., Disp: , Rfl:  .  nitroGLYCERIN (NITROSTAT) 0.4 MG SL tablet, Place 1 tablet (0.4 mg total) under the tongue every 5 (five) minutes x 3 doses as needed for chest pain., Disp: 90 tablet, Rfl: 1 .  ondansetron (ZOFRAN-ODT) 4 MG disintegrating tablet, , Disp: , Rfl:  .  SYMBICORT 80-4.5 MCG/ACT inhaler, Inhale 2 puffs into the lungs 2 (two) times daily., Disp: 10.2 g,  Rfl: 2 .  traZODone (DESYREL) 50 MG tablet, 1-2 tablets one hour before bedtime for insomnia, Disp: 30 tablet, Rfl: 3 .  Syringe/Needle, Disp, (SYRINGE 3CC/22GX3/4") 22G X 3/4" 3 ML MISC, Use as directed for B12 injections, Disp: 100 each, Rfl: 0  EXAM:  VITALS per patient if applicable:  GENERAL: alert, oriented, appears well and in no acute distress  HEENT: atraumatic, conjunttiva clear, no obvious abnormalities on inspection of external nose and ears  NECK: normal movements of the head and neck  LUNGS: on inspection no signs of respiratory distress, breathing rate appears normal, no obvious gross SOB, gasping or wheezing  CV: no obvious cyanosis  MS: moves all visible extremities without noticeable abnormality  PSYCH/NEURO: pleasant and cooperative, no obvious depression or anxiety, speech and thought processing grossly intact  ASSESSMENT AND PLAN:  Discussed the following assessment and plan:  B12 deficiency - Plan: Syringe/Needle, Disp, (SYRINGE 3CC/22GX3/4") 22G X 3/4" 3 ML MISC  Abnormal weight loss  Diarrhea, unspecified type  Major depressive disorder, recurrent episode, moderate (H[facility])  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  Continue Remeron and Megace. No plans to repeat colonoscopy per GI , and given her mutiple comorbid issues and age,  No further workup for occult CA is advised  Diarrhea Secondary to IBS per Dr Allen Norris.  No improvement with time .  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 The patient was provided an opportunity to ask questions and all were answered. The patient agreed with the plan and demonstrated an understanding of the instructions.   The patient was advised to call back or seek an in-person evaluation if the symptoms worsen or if the condition fails to improve as anticipated.  I provided  25 minutes of non-face-to-face time during this encounter reviewing patient's current problems and post surgeries.  Providing counseling on the above mentioned problems , and coordination  of care .   Crecencio Mc, MD

## 2019-03-07 MED ORDER — "SYRINGE 22G X 3/4"" 3 ML MISC": 0 refills | Status: AC

## 2019-03-07 NOTE — Assessment & Plan Note (Signed)
She and her daughter [name] deny worsening depression but she is willing  To increase Lexapro dose to 10 mg daily

## 2019-03-07 NOTE — Assessment & Plan Note (Signed)
Secondary to IBS per Dr Allen Norris.  No improvement with time .  BRAT diet trial,  Will prescribed Lomotil if BRAT diet fails

## 2019-03-07 NOTE — Assessment & Plan Note (Addendum)
Likely secondary to dementia , decreased appetite .  Continue Remeron and Megace. No plans to repeat colonoscopy per GI , and given her mutiple comorbid issues and age,  No further workup for occult CA is advised

## 2019-03-07 NOTE — Assessment & Plan Note (Signed)
She was unable to sleep with substitution of trazodone for diazepam and is now using 5 mg valium again with good results

## 2019-03-09 DIAGNOSIS — I2510 Atherosclerotic heart disease of native coronary artery without angina pectoris: Secondary | ICD-10-CM | POA: Diagnosis not present

## 2019-03-09 DIAGNOSIS — G8929 Other chronic pain: Secondary | ICD-10-CM | POA: Diagnosis not present

## 2019-03-09 DIAGNOSIS — D509 Iron deficiency anemia, unspecified: Secondary | ICD-10-CM | POA: Diagnosis not present

## 2019-03-09 DIAGNOSIS — G253 Myoclonus: Secondary | ICD-10-CM | POA: Diagnosis not present

## 2019-03-09 DIAGNOSIS — K5790 Diverticulosis of intestine, part unspecified, without perforation or abscess without bleeding: Secondary | ICD-10-CM | POA: Diagnosis not present

## 2019-03-09 DIAGNOSIS — M810 Age-related osteoporosis without current pathological fracture: Secondary | ICD-10-CM | POA: Diagnosis not present

## 2019-03-09 DIAGNOSIS — R197 Diarrhea, unspecified: Secondary | ICD-10-CM | POA: Diagnosis not present

## 2019-03-09 DIAGNOSIS — I69318 Other symptoms and signs involving cognitive functions following cerebral infarction: Secondary | ICD-10-CM | POA: Diagnosis not present

## 2019-03-09 DIAGNOSIS — D519 Vitamin B12 deficiency anemia, unspecified: Secondary | ICD-10-CM | POA: Diagnosis not present

## 2019-03-09 DIAGNOSIS — K2270 Barrett's esophagus without dysplasia: Secondary | ICD-10-CM | POA: Diagnosis not present

## 2019-03-09 DIAGNOSIS — J449 Chronic obstructive pulmonary disease, unspecified: Secondary | ICD-10-CM | POA: Diagnosis not present

## 2019-03-10 DIAGNOSIS — K5790 Diverticulosis of intestine, part unspecified, without perforation or abscess without bleeding: Secondary | ICD-10-CM | POA: Diagnosis not present

## 2019-03-10 DIAGNOSIS — J449 Chronic obstructive pulmonary disease, unspecified: Secondary | ICD-10-CM | POA: Diagnosis not present

## 2019-03-10 DIAGNOSIS — G253 Myoclonus: Secondary | ICD-10-CM | POA: Diagnosis not present

## 2019-03-10 DIAGNOSIS — I2510 Atherosclerotic heart disease of native coronary artery without angina pectoris: Secondary | ICD-10-CM | POA: Diagnosis not present

## 2019-03-10 DIAGNOSIS — R197 Diarrhea, unspecified: Secondary | ICD-10-CM | POA: Diagnosis not present

## 2019-03-10 DIAGNOSIS — D519 Vitamin B12 deficiency anemia, unspecified: Secondary | ICD-10-CM | POA: Diagnosis not present

## 2019-03-10 DIAGNOSIS — I69318 Other symptoms and signs involving cognitive functions following cerebral infarction: Secondary | ICD-10-CM | POA: Diagnosis not present

## 2019-03-10 DIAGNOSIS — D509 Iron deficiency anemia, unspecified: Secondary | ICD-10-CM | POA: Diagnosis not present

## 2019-03-10 DIAGNOSIS — G8929 Other chronic pain: Secondary | ICD-10-CM | POA: Diagnosis not present

## 2019-03-10 DIAGNOSIS — M810 Age-related osteoporosis without current pathological fracture: Secondary | ICD-10-CM | POA: Diagnosis not present

## 2019-03-10 DIAGNOSIS — K2270 Barrett's esophagus without dysplasia: Secondary | ICD-10-CM | POA: Diagnosis not present

## 2019-03-12 ENCOUNTER — Other Ambulatory Visit: Payer: Self-pay

## 2019-03-12 ENCOUNTER — Other Ambulatory Visit: Payer: Self-pay | Admitting: Internal Medicine

## 2019-03-12 MED ORDER — ALBUTEROL SULFATE HFA 108 (90 BASE) MCG/ACT IN AERS: 2.0000 | INHALATION_SPRAY | Freq: Four times a day (QID) | RESPIRATORY_TRACT | 2 refills | Status: DC | PRN

## 2019-03-12 NOTE — Telephone Encounter (Signed)
Requested medication (s) are due for refill today: yes  Requested medication (s) are on the active medication list: yes  Last refill:  02/16/2019  Future visit scheduled: yes  Notes to clinic: Patient states that the diarrhea has gotten worse and would like this medication refilled    Requested Prescriptions  Pending Prescriptions Disp Refills   mirtazapine (REMERON) 30 MG tablet       Sig: Take by mouth.     Psychiatry: Antidepressants - mirtazapine Failed - 03/12/2019  1:35 PM      Failed - AST in normal range and within 360 days    AST  Date Value Ref Range Status  03/04/2019 14 (L) 15 - 41 U/L Final   SGOT(AST)  Date Value Ref Range Status  04/20/2014 19 15 - 37 Unit/L Final         Failed - Triglycerides in normal range and within 360 days    Triglycerides  Date Value Ref Range Status  12/02/2017 89.0 0.0 - 149.0 mg/dL Final    Comment:    Normal:  <150 mg/dLBorderline High:  150 - 199 mg/dL         Failed - Total Cholesterol in normal range and within 360 days    Cholesterol  Date Value Ref Range Status  12/02/2017 142 0 - 200 mg/dL Final    Comment:    ATP III Classification       Desirable:  < 200 mg/dL               Borderline High:  200 - 239 mg/dL          High:  > = 240 mg/dL         Passed - ALT in normal range and within 360 days    ALT  Date Value Ref Range Status  03/04/2019 10 0 - 44 U/L Final   SGPT (ALT)  Date Value Ref Range Status  04/20/2014 12 (L) U/L Final    Comment:    14-63 NOTE: New Reference Range 11/10/13          Passed - WBC in normal range and within 360 days    WBC  Date Value Ref Range Status  03/04/2019 10.5 4.0 - 10.5 K/uL Final         Passed - Valid encounter within last 6 months    Recent Outpatient Visits          6 days ago B12 deficiency   [facility] Crecencio Mc, MD   3 weeks ago Diarrhea, unspecified type   [place] Crecencio Mc, MD   1 month ago  Diarrhea, unspecified type   [place] Crecencio Mc, MD   3 months ago Iron deficiency anemia due to chronic blood loss   [place] [facility] Crecencio Mc, MD   7 months ago Vascular dementia with depressed mood [facility])   [place] Crecencio Mc, MD      Future Appointments            In 4 months O'Brien-Blaney, Denisa L, LPN [place], [place] - Completed PHQ-2 or PHQ-9 in the last 360 days.

## 2019-03-12 NOTE — Telephone Encounter (Signed)
Copied from [place] [phone]. Topic: Quick Communication - Rx Refill/Question >> Mar 12, 2019  1:03 PM Rainey Pines A wrote: Medication: mirtazapine (REMERON) 30 MG tablet, Medication for diarrhea (Patient was advised to call if diarrhea did not improve today and request medication.)  Has the patient contacted their pharmacy? Yes (Agent: If no, request that the patient contact the pharmacy for the refill.) (Agent: If yes, when and what did the pharmacy advise?)Contact PCP  Preferred Pharmacy (with phone number or street name): [id number] - [place], [address] [phone] (Phone) [phone] (Fax)    Agent: Please be advised that RX refills may take up to 3 business days. We ask that you follow-up with your pharmacy.

## 2019-03-13 ENCOUNTER — Other Ambulatory Visit: Payer: Self-pay

## 2019-03-13 DIAGNOSIS — I69318 Other symptoms and signs involving cognitive functions following cerebral infarction: Secondary | ICD-10-CM | POA: Diagnosis not present

## 2019-03-13 DIAGNOSIS — K5790 Diverticulosis of intestine, part unspecified, without perforation or abscess without bleeding: Secondary | ICD-10-CM | POA: Diagnosis not present

## 2019-03-13 DIAGNOSIS — K2270 Barrett's esophagus without dysplasia: Secondary | ICD-10-CM | POA: Diagnosis not present

## 2019-03-13 DIAGNOSIS — G8929 Other chronic pain: Secondary | ICD-10-CM | POA: Diagnosis not present

## 2019-03-13 DIAGNOSIS — D519 Vitamin B12 deficiency anemia, unspecified: Secondary | ICD-10-CM | POA: Diagnosis not present

## 2019-03-13 DIAGNOSIS — G253 Myoclonus: Secondary | ICD-10-CM | POA: Diagnosis not present

## 2019-03-13 DIAGNOSIS — I2510 Atherosclerotic heart disease of native coronary artery without angina pectoris: Secondary | ICD-10-CM | POA: Diagnosis not present

## 2019-03-13 DIAGNOSIS — J449 Chronic obstructive pulmonary disease, unspecified: Secondary | ICD-10-CM | POA: Diagnosis not present

## 2019-03-13 DIAGNOSIS — D509 Iron deficiency anemia, unspecified: Secondary | ICD-10-CM | POA: Diagnosis not present

## 2019-03-13 DIAGNOSIS — M810 Age-related osteoporosis without current pathological fracture: Secondary | ICD-10-CM | POA: Diagnosis not present

## 2019-03-13 DIAGNOSIS — R197 Diarrhea, unspecified: Secondary | ICD-10-CM | POA: Diagnosis not present

## 2019-03-13 MED ORDER — DEXILANT 60 MG PO CPDR: 60.0000 mg | DELAYED_RELEASE_CAPSULE | Freq: Every day | ORAL | 6 refills | Status: AC

## 2019-03-16 ENCOUNTER — Telehealth: Payer: Self-pay

## 2019-03-16 DIAGNOSIS — D509 Iron deficiency anemia, unspecified: Secondary | ICD-10-CM | POA: Diagnosis not present

## 2019-03-16 DIAGNOSIS — J449 Chronic obstructive pulmonary disease, unspecified: Secondary | ICD-10-CM | POA: Diagnosis not present

## 2019-03-16 DIAGNOSIS — I69318 Other symptoms and signs involving cognitive functions following cerebral infarction: Secondary | ICD-10-CM | POA: Diagnosis not present

## 2019-03-16 DIAGNOSIS — R197 Diarrhea, unspecified: Secondary | ICD-10-CM | POA: Diagnosis not present

## 2019-03-16 DIAGNOSIS — K2270 Barrett's esophagus without dysplasia: Secondary | ICD-10-CM | POA: Diagnosis not present

## 2019-03-16 DIAGNOSIS — G253 Myoclonus: Secondary | ICD-10-CM | POA: Diagnosis not present

## 2019-03-16 DIAGNOSIS — G8929 Other chronic pain: Secondary | ICD-10-CM | POA: Diagnosis not present

## 2019-03-16 DIAGNOSIS — D519 Vitamin B12 deficiency anemia, unspecified: Secondary | ICD-10-CM | POA: Diagnosis not present

## 2019-03-16 DIAGNOSIS — I2510 Atherosclerotic heart disease of native coronary artery without angina pectoris: Secondary | ICD-10-CM | POA: Diagnosis not present

## 2019-03-16 DIAGNOSIS — K5790 Diverticulosis of intestine, part unspecified, without perforation or abscess without bleeding: Secondary | ICD-10-CM | POA: Diagnosis not present

## 2019-03-16 DIAGNOSIS — M810 Age-related osteoporosis without current pathological fracture: Secondary | ICD-10-CM | POA: Diagnosis not present

## 2019-03-16 NOTE — Telephone Encounter (Signed)
Cerebrovascular disease,  Chronic infarcts.  I don't know how else to state the etiology of vascular dementia

## 2019-03-16 NOTE — Telephone Encounter (Signed)
Copied from [place] [id number]. Topic: General - Other >> Mar 16, 2019  1:27 PM Yvette Rack wrote: Reason for CRM: Elder Cyphers with [facility] stated she needs help with diagnosis coding. Jeani Hawking stated she needs to know the cause of the vascular dementia. Cb# [phone]

## 2019-03-17 DIAGNOSIS — D519 Vitamin B12 deficiency anemia, unspecified: Secondary | ICD-10-CM | POA: Diagnosis not present

## 2019-03-17 DIAGNOSIS — K2270 Barrett's esophagus without dysplasia: Secondary | ICD-10-CM | POA: Diagnosis not present

## 2019-03-17 DIAGNOSIS — M810 Age-related osteoporosis without current pathological fracture: Secondary | ICD-10-CM | POA: Diagnosis not present

## 2019-03-17 DIAGNOSIS — I2510 Atherosclerotic heart disease of native coronary artery without angina pectoris: Secondary | ICD-10-CM | POA: Diagnosis not present

## 2019-03-17 DIAGNOSIS — K5790 Diverticulosis of intestine, part unspecified, without perforation or abscess without bleeding: Secondary | ICD-10-CM | POA: Diagnosis not present

## 2019-03-17 DIAGNOSIS — G8929 Other chronic pain: Secondary | ICD-10-CM | POA: Diagnosis not present

## 2019-03-17 DIAGNOSIS — I69318 Other symptoms and signs involving cognitive functions following cerebral infarction: Secondary | ICD-10-CM | POA: Diagnosis not present

## 2019-03-17 DIAGNOSIS — J449 Chronic obstructive pulmonary disease, unspecified: Secondary | ICD-10-CM | POA: Diagnosis not present

## 2019-03-17 DIAGNOSIS — G253 Myoclonus: Secondary | ICD-10-CM | POA: Diagnosis not present

## 2019-03-17 DIAGNOSIS — D509 Iron deficiency anemia, unspecified: Secondary | ICD-10-CM | POA: Diagnosis not present

## 2019-03-17 DIAGNOSIS — R197 Diarrhea, unspecified: Secondary | ICD-10-CM | POA: Diagnosis not present

## 2019-03-17 NOTE — Telephone Encounter (Signed)
Spoke with Jeani Hawking to let her know the cause of the pt's vascular disease.

## 2019-03-18 ENCOUNTER — Telehealth: Payer: Self-pay | Admitting: Primary Care

## 2019-03-18 DIAGNOSIS — D519 Vitamin B12 deficiency anemia, unspecified: Secondary | ICD-10-CM | POA: Diagnosis not present

## 2019-03-18 DIAGNOSIS — M810 Age-related osteoporosis without current pathological fracture: Secondary | ICD-10-CM | POA: Diagnosis not present

## 2019-03-18 DIAGNOSIS — K5790 Diverticulosis of intestine, part unspecified, without perforation or abscess without bleeding: Secondary | ICD-10-CM | POA: Diagnosis not present

## 2019-03-18 DIAGNOSIS — R197 Diarrhea, unspecified: Secondary | ICD-10-CM | POA: Diagnosis not present

## 2019-03-18 DIAGNOSIS — I2510 Atherosclerotic heart disease of native coronary artery without angina pectoris: Secondary | ICD-10-CM | POA: Diagnosis not present

## 2019-03-18 DIAGNOSIS — D509 Iron deficiency anemia, unspecified: Secondary | ICD-10-CM | POA: Diagnosis not present

## 2019-03-18 DIAGNOSIS — G8929 Other chronic pain: Secondary | ICD-10-CM | POA: Diagnosis not present

## 2019-03-18 DIAGNOSIS — K2270 Barrett's esophagus without dysplasia: Secondary | ICD-10-CM | POA: Diagnosis not present

## 2019-03-18 DIAGNOSIS — G253 Myoclonus: Secondary | ICD-10-CM | POA: Diagnosis not present

## 2019-03-18 DIAGNOSIS — J449 Chronic obstructive pulmonary disease, unspecified: Secondary | ICD-10-CM | POA: Diagnosis not present

## 2019-03-18 DIAGNOSIS — I69318 Other symptoms and signs involving cognitive functions following cerebral infarction: Secondary | ICD-10-CM | POA: Diagnosis not present

## 2019-03-18 MED ORDER — MIRTAZAPINE 30 MG PO TABS: 30.0000 mg | ORAL_TABLET | Freq: Every day | ORAL | 0 refills | Status: AC

## 2019-03-18 NOTE — Telephone Encounter (Signed)
Spoke with daughter [name] regarding Palliative services and she was in agreement with this.  I have scheduled a Telephone Consult for 03/24/19 @ 1:30 PM

## 2019-03-23 ENCOUNTER — Ambulatory Visit: Payer: Self-pay | Admitting: *Deleted

## 2019-03-23 DIAGNOSIS — K2270 Barrett's esophagus without dysplasia: Secondary | ICD-10-CM | POA: Diagnosis not present

## 2019-03-23 DIAGNOSIS — I69318 Other symptoms and signs involving cognitive functions following cerebral infarction: Secondary | ICD-10-CM | POA: Diagnosis not present

## 2019-03-23 DIAGNOSIS — D509 Iron deficiency anemia, unspecified: Secondary | ICD-10-CM | POA: Diagnosis not present

## 2019-03-23 DIAGNOSIS — M810 Age-related osteoporosis without current pathological fracture: Secondary | ICD-10-CM | POA: Diagnosis not present

## 2019-03-23 DIAGNOSIS — I2510 Atherosclerotic heart disease of native coronary artery without angina pectoris: Secondary | ICD-10-CM | POA: Diagnosis not present

## 2019-03-23 DIAGNOSIS — K5790 Diverticulosis of intestine, part unspecified, without perforation or abscess without bleeding: Secondary | ICD-10-CM | POA: Diagnosis not present

## 2019-03-23 DIAGNOSIS — M545 Low back pain, unspecified: Secondary | ICD-10-CM

## 2019-03-23 DIAGNOSIS — R197 Diarrhea, unspecified: Secondary | ICD-10-CM | POA: Diagnosis not present

## 2019-03-23 DIAGNOSIS — G253 Myoclonus: Secondary | ICD-10-CM | POA: Diagnosis not present

## 2019-03-23 DIAGNOSIS — M546 Pain in thoracic spine: Secondary | ICD-10-CM

## 2019-03-23 DIAGNOSIS — J449 Chronic obstructive pulmonary disease, unspecified: Secondary | ICD-10-CM | POA: Diagnosis not present

## 2019-03-23 DIAGNOSIS — G8929 Other chronic pain: Secondary | ICD-10-CM | POA: Diagnosis not present

## 2019-03-23 DIAGNOSIS — D519 Vitamin B12 deficiency anemia, unspecified: Secondary | ICD-10-CM | POA: Diagnosis not present

## 2019-03-23 NOTE — Telephone Encounter (Signed)
Called and spoke to [employer], pt's daughter (ok per [facility]) and advised that if pt is having any tenderness in her spine, that pt may have a vertebral fracture and should go to [facility] to have films per Dr. Derrel Nip.  Kim asked pt if she was having any spine tenderness, pt said that she was having abdominal tenderness but pain was better today.  Consulted with Dr. Derrel Nip, who advised that abdominal pain could be caused by vertebral fracture and pt should be taken to [facility] tomorrow for X-ray.  Pt's daughter agreeable to take pt for X-rays tomorrow morning.

## 2019-03-23 NOTE — Telephone Encounter (Signed)
Anna Prince, PTA from [place] called to report a fall; the pt slid out of bed,  per pt's daughter, sometimes the night of 03/18/2019; they are not sure how this happened;  Anna Prince says the pt was complaining of pain while doing her exercises (pt frail and kyphotic); pt is able to walk; some of her sitting exercises caused her to have back pain; he is not sure if this is a chronic issue; the pt was able walk, and complete her PT session today; the pt sees Dr Derrel Nip, [facility]; will route to office for notification.

## 2019-03-23 NOTE — Addendum Note (Signed)
Addended by: Crecencio Mc on: 03/23/2019 05:18 PM   Modules accepted: Orders

## 2019-03-23 NOTE — Telephone Encounter (Signed)
  Reason for Disposition . General information question, no triage required and triager able to answer question  Answer Assessment - Initial Assessment Questions 1. REASON FOR CALL or QUESTION: "What is your reason for calling today?" or "How can I best help you?" or "What question do you have that I can help answer?"     Kathaleen Bury, PTA from [place] call to report pt fall which occurred the night of 03/19/2019.  Protocols used: INFORMATION ONLY CALL - NO TRIAGE-A-AH

## 2019-03-23 NOTE — Telephone Encounter (Signed)
If she has point tenderness on her spine,  She may have a vertebral fracture. .  Films ordered,  She can get these done at [facility]

## 2019-03-24 ENCOUNTER — Other Ambulatory Visit: Payer: Self-pay

## 2019-03-24 ENCOUNTER — Telehealth: Payer: Self-pay | Admitting: *Deleted

## 2019-03-24 ENCOUNTER — Ambulatory Visit
Admission: RE | Admit: 2019-03-24 | Discharge: 2019-03-24 | Disposition: A | Discharge status: Home / Self Care | Payer: Medicare Other | Source: Ambulatory Visit | Attending: Internal Medicine | Admitting: Internal Medicine

## 2019-03-24 ENCOUNTER — Other Ambulatory Visit: Payer: Medicare Other | Admitting: Primary Care

## 2019-03-24 DIAGNOSIS — J449 Chronic obstructive pulmonary disease, unspecified: Secondary | ICD-10-CM | POA: Diagnosis not present

## 2019-03-24 DIAGNOSIS — M546 Pain in thoracic spine: Secondary | ICD-10-CM | POA: Insufficient documentation

## 2019-03-24 DIAGNOSIS — Z515 Encounter for palliative care: Secondary | ICD-10-CM | POA: Diagnosis not present

## 2019-03-24 DIAGNOSIS — M545 Low back pain, unspecified: Secondary | ICD-10-CM

## 2019-03-24 DIAGNOSIS — J9611 Chronic respiratory failure with hypoxia: Secondary | ICD-10-CM | POA: Diagnosis not present

## 2019-03-24 DIAGNOSIS — S299XXA Unspecified injury of thorax, initial encounter: Secondary | ICD-10-CM | POA: Diagnosis not present

## 2019-03-24 DIAGNOSIS — M549 Dorsalgia, unspecified: Secondary | ICD-10-CM | POA: Diagnosis not present

## 2019-03-24 NOTE — Progress Notes (Signed)
[facility] Consult Note Telephone: [phone]  Fax: [phone]   PATIENT NAME: Anna Prince [address] [place] [address] [phone] (home)  DOB: 05-24-1931 MRN: [id number]  PRIMARY CARE PROVIDER:   Crecencio Mc, MD, [place] [address] [phone]  REFERRING PROVIDER:  Crecencio Mc, MD [place],  [place] [address] [phone]  RESPONSIBLE PARTY:   Extended Emergency Contact Information Primary Emergency Contact: Brown,Kim Address: [address] [place], [place] [address] Johnnette Litter of [place] Phone: [phone] Mobile Phone: [phone] Relation: Daughter   I met with Mrs. Chrystine Oiler and her two daughters Anna Prince and Jeannene Patella and their home. I explain palliative medicine and what it could offer.    ASSESSMENT AND RECOMMENDATIONS:   1. Advance Care Planning/Goals of Care: Goals include to maximize quality of life and symptom management.  Anna Prince is POA, and has paper work.  She is a [employer]ar[employer] manager. Daughter state her memory is starting to slip and she did have some inconsistencies during the interview. We discussed advance care planning. She states that her daughters know her wishes but then later stated she hadn't really thought them through. Her daughter Anna Prince is power of attorney. I explained the MOST form and ask them to review it for next time. I explained that advance care planning was helpful for family members and was a gift she could give to her daughters. They said they would discuss. To assess her decline I will return in three weeks and also to review the most form and upload to [facility] at that time.  2. Symptom Management:   Fatigue: On Lexapro for some years, at 5 mg. May benefit from an increase in dose. She stated she has had increasing fatigue and that her daughters think she could do more.   Pain: She does not take pain medicine but I encouraged her to  also begin acetaminophen over the counter 650 every eight hours as chronic pain can also be very fatiguing. Recommend acetaminophen ATC for pain control and to improve stamina.   Dyspnea: She has a long history of COPD and osteoporosis. Does not use oxygen while up during the day or while ambulating. I suggested that she use oxygen during waking hours as well as this may improve her fatigue and stamina. Oxygen during day may improve stamina as well. She uses at hs but not during the day.  Does not use for ambulation. This could improve her fatigue.   Mobility: States she does not want a hospital bed but daughters state it might help them. Able to do ADLs and bathes self.  Walker use in home, compliance good. Needs for outside.  She states she fell on Wednesday and pulled something in her groin and her hip and back are still painful. They are going for a x-ray later today at the hospital she states that her stamina is increasingly poor. She   3. Family /Caregiver/Community Supports: Lives with daughters in own home.  Does not have any servies. Discussed Museum/gallery curator. Home health could potentially be ordered for safety. Needs PT, OT and aide. Fell Wednesday.   4. Cognitive / Functional decline: Daughters state decline, patient appears oriented.   5. Follow up Palliative Care Visit: Palliative care will continue to follow for goals of care clarification and symptom management. Return 3 weeks or prn.  I spent 60 minutes  providing this consultation,  from 1400 to 1500. More than 50% of the time in this consultation was spent coordinating communication.   HISTORY OF PRESENT ILLNESS:  Anna Prince is a 85 y.o. year old female with multiple medical problems including falls, cognitive impairment, CoPD, osteoporosis, CVA, CAD. Palliative Care was asked to follow this patient by consultation request of Crecencio Mc, MD to help address advance care planning and goals of care. This is the initial  visit.  CODE STATUS: TBD  PPS: 30% HOSPICE ELIGIBILITY/DIAGNOSIS: TBD  PAST MEDICAL HISTORY:  Past Medical History:  Diagnosis Date  . AAA (abdominal aortic aneurysm) ([facility])   . Anxiety   . Aortic aneurysm ([place])   . Arthritis   . Barrett's esophagus 2007  . Bursitis 2012   left hip, improved with periodic steroid injection [facility])  . COPD (chronic obstructive pulmonary disease) ([facility])   . Coronary artery disease    a. LHC (08/27/16): 15% ostial LM, 99% pLAD and D1, Ramus & LCx nl, m/dRCA diffuse 80-90%, PDA occluded proximally, fills via L-R collats, EF 50-55%; b. PCI (08/31/16): Severe pLAD dz involving D1, successful orbital atherectomy PCI to pLAD w/ Synergy 3.0 x 20 mm DES, 0% residual stenosis & TIMI 3 flow, occluded ostD1, medically managed given lack of CP  . Depression   . Diastolic dysfunction    a. TTE (08/25/16): Mild LVH with LVEF of 50-55% and grade 1 diastolic dysfunction. Mild MR. Mild right atrial enlargement. Normal RV size and function  . GERD (gastroesophageal reflux disease)    with Barretts Esophagus  . GI bleed 03/26/2017  . GI bleeding    a. required pRBC May, July, and December 2018  . Hyperlipidemia   . Hypertension   . Iron deficiency anemia 2012  . Lumbago   . Macular degeneration   . Osteoporosis   . Shingles    right back/abdomen 08/16/17   . Sigmoid diverticulosis    by colonoscopy  . Stroke [facility])     SOCIAL HX:  Social History   Tobacco Use  . Smoking status: Current Every Day Smoker    Packs/day: 0.50    Types: Cigarettes  . Smokeless tobacco: Never Used  . Tobacco comment: HAS BEEN SMOKING 30+ YEARS,RESUMED TOBACCO USE AFTER AAA REPAIR  Substance Use Topics  . Alcohol use: No    ALLERGIES:  Allergies  Allergen Reactions  . Morphine Sulfate Other (See Comments)    Reaction:  Hallucinations      PERTINENT MEDICATIONS:  Outpatient Encounter Medications as of 03/24/2019  Medication Sig  . albuterol (VENTOLIN HFA) 108 (90 Base)  MCG/ACT inhaler Inhale 2 puffs into the lungs every 6 (six) hours as needed for wheezing or shortness of breath.  Marland Kitchen atorvastatin (LIPITOR) 40 MG tablet TAKE 1 TABLET(40 MG) BY MOUTH DAILY AT 6 PM  . clopidogrel (PLAVIX) 75 MG tablet TAKE 1 TABLET(75 MG) BY MOUTH DAILY  . cyanocobalamin (,VITAMIN B-12,) 1000 MCG/ML injection Daily for 3 days,  Then weekly x 4 then monthly thereafter  . diazepam (VALIUM) 5 MG tablet Take 1 tablet (5 mg total) by mouth at bedtime.  . ergocalciferol (DRISDOL) 1.25 MG (50000 UT) capsule Take 1 capsule (50,000 Units total) by mouth once a week.  . escitalopram (LEXAPRO) 5 MG tablet   . ferrous sulfate 325 (65 FE) MG tablet Take 1 tablet (325 mg total) by mouth daily with breakfast.  . megestrol (MEGACE) 40 MG tablet Take 1 tablet (40 mg  total) by mouth daily.  . mirtazapine (REMERON) 30 MG tablet Take 1 tablet (30 mg total) by mouth at bedtime.  . nitroGLYCERIN (NITROSTAT) 0.4 MG SL tablet Place 1 tablet (0.4 mg total) under the tongue every 5 (five) minutes x 3 doses as needed for chest pain.  . SYMBICORT 80-4.5 MCG/ACT inhaler Inhale 2 puffs into the lungs 2 (two) times daily.  . Syringe/Needle, Disp, (SYRINGE 3CC/22GX3/4") 22G X 3/4" 3 ML MISC Use as directed for B12 injections  . dexlansoprazole (DEXILANT) 60 MG capsule Take 1 capsule (60 mg total) by mouth daily. (Patient not taking: Reported on 03/24/2019)  . HYDROcodone-acetaminophen (NORCO/VICODIN) 5-325 MG tablet Take 1 tablet by mouth 2 (two) times daily as needed for moderate pain. Maximum 2 daily. (Patient not taking: Reported on 03/24/2019)  . HYDROcodone-acetaminophen (NORCO/VICODIN) 5-325 MG tablet Take 1 tablet by mouth 2 (two) times daily as needed. Maximum 2 daily (Patient not taking: Reported on 03/24/2019)  . HYDROcodone-acetaminophen (NORCO/VICODIN) 5-325 MG tablet Take 1 tablet by mouth 2 (two) times daily as needed for moderate pain. Maximum 2 daily. (Patient not taking: Reported on 03/24/2019)  .  ondansetron (ZOFRAN-ODT) 4 MG disintegrating tablet   . traZODone (DESYREL) 50 MG tablet 1-2 tablets one hour before bedtime for insomnia (Patient not taking: Reported on 03/24/2019)   No facility-administered encounter medications on file as of 03/24/2019.     PHYSICAL EXAM / ROS:   Current and past weights: 44 kg ( 96 lbs) General: NAD, frail appearing, thin Cardiovascular: no chest pain reported, no edema Pulmonary: no cough, no increased SOB, oxygen 2 l at hs. Abdomen: appetite fair to good, denies constipation, diarrhea is frequent and longstanding,  incontinent of bowel per daughter report GU: denies dysuria, incontinent of urine per daughter report MSK:  no joint deformities, ambulatory with walker in home and outside, fall x 1 this week. Cannot lift left leg in groin region, states pain in hip and back. H/o osteoporosis. Skin: no rashes or wounds reported Neurological: Weakness, sleep is good, at night.   Jason Coop, NP  COVID-19 PATIENT SCREENING TOOL  Person answering questions: _______Kim____________ _____   1.  Is the patient or any family member in the home showing any signs or symptoms regarding respiratory infection?               Person with Symptom- ___________NA________________  a. Fever                                                                          Yes___ No___          ___________________  b. Shortness of breath                                                    Yes___ No___          ___________________ c. Cough/congestion  Yes___  No___         ___________________ d. Body aches/pains                                                         Yes___ No___        ____________________ e. Gastrointestinal symptoms (diarrhea, nausea)           Yes___ No___        ____________________  2. Within the past 14 days, has anyone living in the home had any contact with someone with or under investigation for COVID-19?     Yes___ No__x   Person __________________

## 2019-03-24 NOTE — Telephone Encounter (Signed)
Copied from [place] [phone]. Topic: Quick Communication - See Telephone Encounter >> Mar 24, 2019  4:33 PM Loma Boston wrote: CRM for notification. See Telephone encounter for: 03/24/19. [name] (daughter) states pt had her x-rays done today

## 2019-03-24 NOTE — Telephone Encounter (Signed)
The films have notbeen read yet

## 2019-03-26 ENCOUNTER — Other Ambulatory Visit: Payer: Self-pay | Admitting: Internal Medicine

## 2019-03-26 NOTE — Telephone Encounter (Signed)
Lisinopril was stopped in January

## 2019-03-26 NOTE — Telephone Encounter (Signed)
Looks like this was discontinued back in 03/2018.

## 2019-03-27 ENCOUNTER — Other Ambulatory Visit: Payer: Self-pay

## 2019-03-27 ENCOUNTER — Telehealth: Payer: Self-pay | Admitting: *Deleted

## 2019-03-27 DIAGNOSIS — G8929 Other chronic pain: Secondary | ICD-10-CM | POA: Diagnosis not present

## 2019-03-27 DIAGNOSIS — D509 Iron deficiency anemia, unspecified: Secondary | ICD-10-CM | POA: Diagnosis not present

## 2019-03-27 DIAGNOSIS — R197 Diarrhea, unspecified: Secondary | ICD-10-CM | POA: Diagnosis not present

## 2019-03-27 DIAGNOSIS — K2270 Barrett's esophagus without dysplasia: Secondary | ICD-10-CM | POA: Diagnosis not present

## 2019-03-27 DIAGNOSIS — M810 Age-related osteoporosis without current pathological fracture: Secondary | ICD-10-CM | POA: Diagnosis not present

## 2019-03-27 DIAGNOSIS — I69318 Other symptoms and signs involving cognitive functions following cerebral infarction: Secondary | ICD-10-CM | POA: Diagnosis not present

## 2019-03-27 DIAGNOSIS — Z20822 Contact with and (suspected) exposure to covid-19: Secondary | ICD-10-CM

## 2019-03-27 DIAGNOSIS — I2510 Atherosclerotic heart disease of native coronary artery without angina pectoris: Secondary | ICD-10-CM | POA: Diagnosis not present

## 2019-03-27 DIAGNOSIS — K5790 Diverticulosis of intestine, part unspecified, without perforation or abscess without bleeding: Secondary | ICD-10-CM | POA: Diagnosis not present

## 2019-03-27 DIAGNOSIS — G253 Myoclonus: Secondary | ICD-10-CM | POA: Diagnosis not present

## 2019-03-27 DIAGNOSIS — J449 Chronic obstructive pulmonary disease, unspecified: Secondary | ICD-10-CM | POA: Diagnosis not present

## 2019-03-27 DIAGNOSIS — D519 Vitamin B12 deficiency anemia, unspecified: Secondary | ICD-10-CM | POA: Diagnosis not present

## 2019-03-27 NOTE — Telephone Encounter (Signed)
Copied from [place] [phone]. Topic: General - Other >> Mar 27, 2019 11:02 AM Antonieta Iba C wrote: Reason for CRM: pt's daughter [name] called in stating that she is returning nurse Tristyn Demarest's call   CB:

## 2019-03-29 LAB — NOVEL CORONAVIRUS, NAA: SARS-CoV-2, NAA: NOT DETECTED

## 2019-03-30 ENCOUNTER — Telehealth: Payer: Self-pay | Admitting: Internal Medicine

## 2019-03-30 NOTE — Telephone Encounter (Signed)
Copied from [place] [phone]. Topic: General - Other >> Mar 30, 2019 11:34 AM Keene Breath wrote: Reason for CRM: Called to get verbal orders to continue PT 1x wk 4,  Call to discuss if needed at [phone]

## 2019-03-30 NOTE — Telephone Encounter (Signed)
I called and spoke with Gerald Stabs to give verbal orders for patient.

## 2019-03-31 ENCOUNTER — Telehealth: Payer: Self-pay | Admitting: Internal Medicine

## 2019-03-31 NOTE — Telephone Encounter (Signed)
YES YOU CAN GIVE  THE VERBAL ORDERS FOR [facility] OUT FOLEY CATH FOR UA

## 2019-03-31 NOTE — Telephone Encounter (Signed)
Malinda with [facility] calling to see if she can get verbal orders for a UA with culture done by in and out cath (they will try clean catch first) for pt.  Caregiver is reporting low energy, confusion, and bed wetting. Cinda Quest can be reached at [phone] x2.

## 2019-03-31 NOTE — Telephone Encounter (Signed)
See lab note.  

## 2019-03-31 NOTE — Telephone Encounter (Signed)
Verbal orders given to [facility] with [facility] [place].

## 2019-03-31 NOTE — Telephone Encounter (Signed)
Is it okay to give verbal orders?  

## 2019-04-01 ENCOUNTER — Encounter: Payer: Self-pay | Admitting: Internal Medicine

## 2019-04-01 DIAGNOSIS — K5790 Diverticulosis of intestine, part unspecified, without perforation or abscess without bleeding: Secondary | ICD-10-CM | POA: Diagnosis not present

## 2019-04-01 DIAGNOSIS — K2270 Barrett's esophagus without dysplasia: Secondary | ICD-10-CM | POA: Diagnosis not present

## 2019-04-01 DIAGNOSIS — N390 Urinary tract infection, site not specified: Secondary | ICD-10-CM | POA: Diagnosis not present

## 2019-04-01 DIAGNOSIS — D509 Iron deficiency anemia, unspecified: Secondary | ICD-10-CM | POA: Diagnosis not present

## 2019-04-01 DIAGNOSIS — G8929 Other chronic pain: Secondary | ICD-10-CM | POA: Diagnosis not present

## 2019-04-01 DIAGNOSIS — M810 Age-related osteoporosis without current pathological fracture: Secondary | ICD-10-CM | POA: Diagnosis not present

## 2019-04-01 DIAGNOSIS — J449 Chronic obstructive pulmonary disease, unspecified: Secondary | ICD-10-CM | POA: Diagnosis not present

## 2019-04-01 DIAGNOSIS — R197 Diarrhea, unspecified: Secondary | ICD-10-CM | POA: Diagnosis not present

## 2019-04-01 DIAGNOSIS — D519 Vitamin B12 deficiency anemia, unspecified: Secondary | ICD-10-CM | POA: Diagnosis not present

## 2019-04-01 DIAGNOSIS — I2510 Atherosclerotic heart disease of native coronary artery without angina pectoris: Secondary | ICD-10-CM | POA: Diagnosis not present

## 2019-04-01 DIAGNOSIS — G253 Myoclonus: Secondary | ICD-10-CM | POA: Diagnosis not present

## 2019-04-01 DIAGNOSIS — I69318 Other symptoms and signs involving cognitive functions following cerebral infarction: Secondary | ICD-10-CM | POA: Diagnosis not present

## 2019-04-02 ENCOUNTER — Telehealth: Payer: Self-pay

## 2019-04-02 ENCOUNTER — Other Ambulatory Visit: Payer: Self-pay

## 2019-04-02 DIAGNOSIS — I2510 Atherosclerotic heart disease of native coronary artery without angina pectoris: Secondary | ICD-10-CM | POA: Diagnosis not present

## 2019-04-02 DIAGNOSIS — F17210 Nicotine dependence, cigarettes, uncomplicated: Secondary | ICD-10-CM | POA: Diagnosis not present

## 2019-04-02 DIAGNOSIS — R509 Fever, unspecified: Secondary | ICD-10-CM | POA: Diagnosis present

## 2019-04-02 DIAGNOSIS — Z79899 Other long term (current) drug therapy: Secondary | ICD-10-CM | POA: Diagnosis not present

## 2019-04-02 DIAGNOSIS — J439 Emphysema, unspecified: Secondary | ICD-10-CM | POA: Diagnosis not present

## 2019-04-02 DIAGNOSIS — K2270 Barrett's esophagus without dysplasia: Secondary | ICD-10-CM | POA: Diagnosis not present

## 2019-04-02 DIAGNOSIS — Z8673 Personal history of transient ischemic attack (TIA), and cerebral infarction without residual deficits: Secondary | ICD-10-CM | POA: Insufficient documentation

## 2019-04-02 DIAGNOSIS — R197 Diarrhea, unspecified: Secondary | ICD-10-CM | POA: Diagnosis not present

## 2019-04-02 DIAGNOSIS — J449 Chronic obstructive pulmonary disease, unspecified: Secondary | ICD-10-CM | POA: Diagnosis not present

## 2019-04-02 DIAGNOSIS — J189 Pneumonia, unspecified organism: Secondary | ICD-10-CM | POA: Diagnosis not present

## 2019-04-02 DIAGNOSIS — K5790 Diverticulosis of intestine, part unspecified, without perforation or abscess without bleeding: Secondary | ICD-10-CM | POA: Diagnosis not present

## 2019-04-02 DIAGNOSIS — I10 Essential (primary) hypertension: Secondary | ICD-10-CM | POA: Diagnosis not present

## 2019-04-02 DIAGNOSIS — Z7901 Long term (current) use of anticoagulants: Secondary | ICD-10-CM | POA: Diagnosis not present

## 2019-04-02 DIAGNOSIS — G8929 Other chronic pain: Secondary | ICD-10-CM | POA: Diagnosis not present

## 2019-04-02 DIAGNOSIS — I69318 Other symptoms and signs involving cognitive functions following cerebral infarction: Secondary | ICD-10-CM | POA: Diagnosis not present

## 2019-04-02 DIAGNOSIS — G253 Myoclonus: Secondary | ICD-10-CM | POA: Diagnosis not present

## 2019-04-02 DIAGNOSIS — D519 Vitamin B12 deficiency anemia, unspecified: Secondary | ICD-10-CM | POA: Diagnosis not present

## 2019-04-02 DIAGNOSIS — M810 Age-related osteoporosis without current pathological fracture: Secondary | ICD-10-CM | POA: Diagnosis not present

## 2019-04-02 DIAGNOSIS — D509 Iron deficiency anemia, unspecified: Secondary | ICD-10-CM | POA: Diagnosis not present

## 2019-04-02 MED ORDER — ACETAMINOPHEN 325 MG PO TABS
ORAL_TABLET | ORAL | Status: AC
  Filled 2019-04-02: qty 2

## 2019-04-02 MED ORDER — ACETAMINOPHEN 325 MG PO TABS
650.0000 mg | ORAL_TABLET | Freq: Once | ORAL | Status: AC
  Administered 2019-04-02: 650 mg via ORAL

## 2019-04-02 MED ORDER — SODIUM CHLORIDE 0.9% FLUSH: 3.0000 mL | Freq: Once | INTRAVENOUS | Status: DC

## 2019-04-02 NOTE — Telephone Encounter (Signed)
Patient's daughter [name] is calling back to speak to Tye Maryland to request if Dr. Derrel Nip can order someone to come to the patient's home to give a chest x-ray. Please advise [phone]  Alanee Ting,cma

## 2019-04-02 NOTE — ED Triage Notes (Signed)
Pt to the er for confusion, weakness and fever. Pt has home health RN at home. Pt has a hx of UTI. Home health RN advised pt to come. Nothing given at home due to pt refusal.

## 2019-04-02 NOTE — Telephone Encounter (Signed)
Patient's daughter [name] is calling back to speak to Tye Maryland to request if Dr. Derrel Nip can order someone to come to the patient's home to give a chest x-ray. Please advise [phone]

## 2019-04-02 NOTE — Telephone Encounter (Signed)
Copied from [place] [phone]. Topic: General - Other >> Apr 02, 2019 11:02 AM Rainey Pines A wrote:

## 2019-04-02 NOTE — Telephone Encounter (Signed)
Left message for patient daughter to return call to office.

## 2019-04-03 ENCOUNTER — Emergency Department: Payer: Medicare Other

## 2019-04-03 ENCOUNTER — Other Ambulatory Visit: Payer: Self-pay

## 2019-04-03 ENCOUNTER — Emergency Department
Admission: EM | Admit: 2019-04-03 | Discharge: 2019-04-03 | Disposition: A | Discharge status: Home / Self Care | Payer: Medicare Other | Attending: Emergency Medicine | Admitting: Emergency Medicine

## 2019-04-03 ENCOUNTER — Telehealth: Payer: Self-pay | Admitting: Internal Medicine

## 2019-04-03 DIAGNOSIS — J189 Pneumonia, unspecified organism: Secondary | ICD-10-CM | POA: Diagnosis not present

## 2019-04-03 DIAGNOSIS — R509 Fever, unspecified: Secondary | ICD-10-CM | POA: Diagnosis not present

## 2019-04-03 DIAGNOSIS — J439 Emphysema, unspecified: Secondary | ICD-10-CM | POA: Diagnosis not present

## 2019-04-03 LAB — CBC
HCT: 29.2 % — ABNORMAL LOW (ref 36.0–46.0)
Hemoglobin: 9.6 g/dL — ABNORMAL LOW (ref 12.0–15.0)
MCH: 29.7 pg (ref 26.0–34.0)
MCHC: 32.9 g/dL (ref 30.0–36.0)
MCV: 90.4 fL (ref 80.0–100.0)
Platelets: 202 10*3/uL (ref 150–400)
RBC: 3.23 MIL/uL — ABNORMAL LOW (ref 3.87–5.11)
RDW: 14.7 % (ref 11.5–15.5)
WBC: 8.4 10*3/uL (ref 4.0–10.5)
nRBC: 0 % (ref 0.0–0.2)

## 2019-04-03 LAB — URINALYSIS, COMPLETE (UACMP) WITH MICROSCOPIC
Bilirubin Urine: NEGATIVE
Glucose, UA: NEGATIVE mg/dL
Hgb urine dipstick: NEGATIVE
Ketones, ur: NEGATIVE mg/dL
Leukocytes,Ua: NEGATIVE
Nitrite: NEGATIVE
Protein, ur: NEGATIVE mg/dL
Specific Gravity, Urine: 1.018 (ref 1.005–1.030)
Squamous Epithelial / HPF: NONE SEEN (ref 0–5)
pH: 5 (ref 5.0–8.0)

## 2019-04-03 LAB — COMPREHENSIVE METABOLIC PANEL
ALT: 17 U/L (ref 0–44)
AST: 20 U/L (ref 15–41)
Albumin: 2.8 g/dL — ABNORMAL LOW (ref 3.5–5.0)
Alkaline Phosphatase: 58 U/L (ref 38–126)
Anion gap: 8 (ref 5–15)
BUN: 29 mg/dL — ABNORMAL HIGH (ref 8–23)
CO2: 21 mmol/L — ABNORMAL LOW (ref 22–32)
Calcium: 8.3 mg/dL — ABNORMAL LOW (ref 8.9–10.3)
Chloride: 108 mmol/L (ref 98–111)
Creatinine, Ser: 0.98 mg/dL (ref 0.44–1.00)
GFR calc Af Amer: >60 mL/min (ref >60)
GFR calc non Af Amer: 53 mL/min — ABNORMAL LOW (ref >60)
Glucose, Bld: 106 mg/dL — ABNORMAL HIGH (ref 70–99)
Potassium: 3.7 mmol/L (ref 3.5–5.1)
Sodium: 137 mmol/L (ref 135–145)
Total Bilirubin: 0.4 mg/dL (ref 0.3–1.2)
Total Protein: 6.5 g/dL (ref 6.5–8.1)

## 2019-04-03 MED ORDER — AMOXICILLIN-POT CLAVULANATE 875-125 MG PO TABS
1.0000 | ORAL_TABLET | Freq: Once | ORAL | Status: AC
  Administered 2019-04-03: 1 via ORAL
  Filled 2019-04-03: qty 1

## 2019-04-03 MED ORDER — AZITHROMYCIN 250 MG PO TABS: ORAL_TABLET | ORAL | 0 refills | Status: AC

## 2019-04-03 MED ORDER — AZITHROMYCIN 500 MG PO TABS
500.0000 mg | ORAL_TABLET | ORAL | Status: AC
  Administered 2019-04-03: 500 mg via ORAL
  Filled 2019-04-03: qty 1

## 2019-04-03 MED ORDER — MEGESTROL ACETATE 40 MG PO TABS: 40.0000 mg | ORAL_TABLET | Freq: Every day | ORAL | 2 refills | Status: AC

## 2019-04-03 MED ORDER — AMOXICILLIN-POT CLAVULANATE 875-125 MG PO TABS: 1.0000 | ORAL_TABLET | Freq: Two times a day (BID) | ORAL | 0 refills | Status: AC

## 2019-04-03 NOTE — Telephone Encounter (Signed)
Refilled: 12/12/2018 Last OV: 03/06/2019 Next OV: not scheduled

## 2019-04-03 NOTE — ED Notes (Signed)
Called lab for trop

## 2019-04-03 NOTE — Telephone Encounter (Signed)
Pt was seen in the ED and diagnosed with pneumonia.

## 2019-04-03 NOTE — Discharge Instructions (Addendum)
As we discussed, Anna Prince's work-up tonight was generally reassuring.  She does not appear to have a urinary tract infection but she does have evidence of pneumonia on the CT scan and chest x-ray, specifically in the area of the left lung known as the lingula.  Given that she is otherwise doing well with stable vital signs and normal lab work, we think she is appropriate for discharge and outpatient follow-up, but I encourage you to follow-up with her primary care physician at the next available opportunity, at least with a phone call and telemedicine visit.  Return to the emergency department if you develop new or worsening symptoms that concern you.

## 2019-04-03 NOTE — ED Provider Notes (Signed)
[facility] Emergency Department Provider Note  ____________________________________________   First MD Initiated Contact with Patient 04/03/19 0230     (approximate)  I have reviewed the triage vital signs and the nursing notes.   HISTORY  Chief Complaint Fever  Level 5 caveat:  history/ROS limited by chronic dementia  HPI Anna Prince is a 85 y.o. female with extensive medical history as listed below which notably includes oxygen dependent COPD and dementia (according to the patient's daughter/healthcare power of attorney) and who has been declining in overall status for a period of time now.  They present tonight for evaluation of some confusion as well as intermittent fevers for the last 1 to 2 days.  The daughter has been concerned for a while about the possibility of pneumonia, COVID-19, and/or urinary tract infection.  The home health nurse sent a urine a couple of days ago but they do not have a result.  They were not able to get an outpatient x-ray.  There was a possible Covid exposure about 10 days ago and both the patient and the daughter had outpatient Covid testing which has come back negative within the last couple of days.  The daughter reports that she brought her mother in tonight because her daughter said the patient told her that she was tired and wanted to "give out".  The patient is sleeping comfortably at this time.  When I woke her up she says that nothing hurts.  She says that she feels tired overall.  She denies chest pain, shortness of breath, and she denies any pain at this time.  The symptoms reported by the daughter have been somewhat gradual in onset.  They have had a recent palliative care consult to discuss goals of care and CODE STATUS and her ongoing chronic medical issues.  Dr. Derrel Nip is her primary care provider.     Past Medical History:  Diagnosis Date  . AAA (abdominal aortic aneurysm) ([place])   . Anxiety   . Aortic  aneurysm ([place])   . Arthritis   . Barrett's esophagus 2007  . Bursitis 2012   left hip, improved with periodic steroid injection [facility] [place])  . COPD (chronic obstructive pulmonary disease) ([place])   . Coronary artery disease    a. LHC (08/27/16): 15% ostial LM, 99% pLAD and D1, Ramus & LCx nl, m/dRCA diffuse 80-90%, PDA occluded proximally, fills via L-R collats, EF 50-55%; b. PCI (08/31/16): Severe pLAD dz involving D1, successful orbital atherectomy PCI to pLAD w/ Synergy 3.0 x 20 mm DES, 0% residual stenosis & TIMI 3 flow, occluded ostD1, medically managed given lack of CP  . Depression   . Diastolic dysfunction    a. TTE (08/25/16): Mild LVH with LVEF of 50-55% and grade 1 diastolic dysfunction. Mild MR. Mild right atrial enlargement. Normal RV size and function  . GERD (gastroesophageal reflux disease)    with Barretts Esophagus  . GI bleed 03/26/2017  . GI bleeding    a. required pRBC May, July, and December 2018  . Hyperlipidemia   . Hypertension   . Iron deficiency anemia 2012  . Lumbago   . Macular degeneration   . Osteoporosis   . Shingles    right back/abdomen 08/16/17   . Sigmoid diverticulosis    by colonoscopy  . Stroke [facility])     Patient Active Problem List   Diagnosis Date Noted  . 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 B12 deficiency 12/30/2018  .  IBS (irritable bowel syndrome) 12/14/2018  . Abnormal weight loss 09/01/2017  . Chronic pain due to injury 09/01/2017  . Herpes zoster without complication [id number]  . Hiatal hernia   . Diverticulosis of colon without diverticulitis   . Diarrhea   . GERD (gastroesophageal reflux disease) 11/05/2016  . CAD (coronary artery disease) 08/28/2016  . Non-ST elevation (NSTEMI) myocardial infarction ([place]) 08/24/2016  . Chronic respiratory failure with hypoxia ([place]) 05/17/2016  . Generalized muscle weakness 04/16/2015  . Hospital discharge follow-up 04/16/2015  . Coronary artery disease due to lipid rich plaque  04/14/2015  . Anxiety 04/14/2015  . Bursitis of left hip 11/27/2014  . Iron deficiency anemia 11/25/2014  . History of fracture of pelvis or lower extremity 08/25/2014  . Vascular dementia with depressed mood ([place]) 02/20/2014  . Tobacco abuse counseling 04/10/2013  . Anemia   . Gastric ulcer requiring drug therapy   . Screening for colon cancer 06/18/2011  . COPD (chronic obstructive pulmonary disease) ([place]) 06/18/2011  . Skin cancer of nose 12/17/2010  . Vitamin D deficiency 12/17/2010  . Hyperlipidemia LDL goal <100 12/17/2010  . Screening for malignant neoplasm of breast 12/17/2010  . Encounter for long-term (current) use of other medications 12/17/2010  . S/P abdominal aortic aneurysm repair 12/17/2010  . Diverticulosis, sigmoid 12/08/2010  . Hyperlipidemia 12/08/2010  . Hypertension 12/08/2010  . Lumbago 12/08/2010  . Osteoporosis 12/08/2010  . Major depressive disorder, recurrent episode, moderate ([place]) 12/08/2010    Past Surgical History:  Procedure Laterality Date  . ABDOMINAL AORTIC ANEURYSM REPAIR  July 2010   [facility]  . APPENDECTOMY    . BACK SURGERY     X2..1975 arachnoid cyst cervical region([place]);1997 lumbosacral tumor,9 hr surgery  . CARDIAC CATHETERIZATION    . CARDIAC CATHETERIZATION    . cardiac stents    . COLONOSCOPY N/A 03/30/2017   Procedure: COLONOSCOPY;  Surgeon: Lin Landsman, MD;  Location: [facility] ENDOSCOPY;  Service: Gastroenterology;  Laterality: N/A;  . CORONARY ATHERECTOMY N/A 08/31/2016   Procedure: Coronary Atherectomy;  Surgeon: Nelva Bush, MD;  Location: [place] CV LAB;  Service: Cardiovascular;  Laterality: N/A;  . ESOPHAGOGASTRODUODENOSCOPY N/A 03/29/2017   Procedure: ESOPHAGOGASTRODUODENOSCOPY (EGD);  Surgeon: Virgel Manifold, MD;  Location: [facility] ENDOSCOPY;  Service: Endoscopy;  Laterality: N/A;  . ESOPHAGOGASTRODUODENOSCOPY (EGD) WITH PROPOFOL N/A 09/02/2016   Procedure: ESOPHAGOGASTRODUODENOSCOPY (EGD) WITH  PROPOFOL;  Surgeon: Otis Brace, MD;  Location: [facility] ENDOSCOPY;  Service: Gastroenterology;  Laterality: N/A;  . ESOPHAGOGASTRODUODENOSCOPY (EGD) WITH PROPOFOL N/A 11/06/2016   Procedure: ESOPHAGOGASTRODUODENOSCOPY (EGD) WITH PROPOFOL;  Surgeon: Lucilla Lame, MD;  Location: [facility] ENDOSCOPY;  Service: Endoscopy;  Laterality: N/A;  . FLEXIBLE SIGMOIDOSCOPY N/A 03/29/2017   Procedure: FLEXIBLE SIGMOIDOSCOPY;  Surgeon: Virgel Manifold, MD;  Location: [facility] ENDOSCOPY;  Service: Endoscopy;  Laterality: N/A;  . INTRAVASCULAR ULTRASOUND/IVUS N/A 08/31/2016   Procedure: Intravascular Ultrasound/IVUS;  Surgeon: Nelva [place], MD;  Location: [place] CV LAB;  Service: Cardiovascular;  Laterality: N/A;  . LEFT HEART CATH AND CORONARY ANGIOGRAPHY N/A 08/27/2016   Procedure: Left Heart Cath and Coronary Angiography;  Surgeon: Teodoro Spray, MD;  Location: [place] CV LAB;  Service: Cardiovascular;  Laterality: N/A;    Prior to Admission medications   Medication Sig Start Date End Date Taking? Authorizing Provider  albuterol (VENTOLIN HFA) 108 (90 Base) MCG/ACT inhaler Inhale 2 puffs into the lungs every 6 (six) hours as needed for wheezing or shortness of breath. 03/12/19   Crecencio Mc, MD  amoxicillin-clavulanate (AUGMENTIN) 875-125 MG  tablet Take 1 tablet by mouth every 12 (twelve) hours for 10 days. 04/03/19 04/13/19  Hinda Kehr, MD  atorvastatin (LIPITOR) 40 MG tablet TAKE 1 TABLET(40 MG) BY MOUTH DAILY AT 6 PM 01/27/19   Crecencio Mc, MD  azithromycin (ZITHROMAX) 250 MG tablet Take 2 tablets PO on day 1, then take 1 tablet PO daily for 4 more days 04/03/19   Hinda Kehr, MD  clopidogrel (PLAVIX) 75 MG tablet TAKE 1 TABLET(75 MG) BY MOUTH DAILY 01/21/19   Crecencio Mc, MD  cyanocobalamin (,VITAMIN B-12,) 1000 MCG/ML injection Daily for 3 days,  Then weekly x 4 then monthly thereafter 01/06/19   Crecencio Mc, MD  dexlansoprazole (DEXILANT) 60 MG capsule Take 1 capsule (60 mg  total) by mouth daily. Patient not taking: Reported on 03/24/2019 03/13/19   Lucilla Lame, MD  diazepam (VALIUM) 5 MG tablet Take 1 tablet (5 mg total) by mouth at bedtime. 02/18/19   Crecencio Mc, MD  ergocalciferol (DRISDOL) 1.25 MG (50000 UT) capsule Take 1 capsule (50,000 Units total) by mouth once a week. 07/22/18   Crecencio Mc, MD  escitalopram (LEXAPRO) 5 MG tablet  01/12/19   [provider]  ferrous sulfate 325 (65 FE) MG tablet Take 1 tablet (325 mg total) by mouth daily with breakfast. 09/03/16   Lendon Colonel, NP  HYDROcodone-acetaminophen (NORCO/VICODIN) 5-325 MG tablet Take 1 tablet by mouth 2 (two) times daily as needed for moderate pain. Maximum 2 daily. Patient not taking: Reported on 03/24/2019 12/12/18   Crecencio Mc, MD  HYDROcodone-acetaminophen (NORCO/VICODIN) 5-325 MG tablet Take 1 tablet by mouth 2 (two) times daily as needed. Maximum 2 daily Patient not taking: Reported on 03/24/2019 01/11/19   Crecencio Mc, MD  HYDROcodone-acetaminophen (NORCO/VICODIN) 5-325 MG tablet Take 1 tablet by mouth 2 (two) times daily as needed for moderate pain. Maximum 2 daily. Patient not taking: Reported on 03/24/2019 02/10/19   Crecencio Mc, MD  loperamide (LOPERAMIDE A-D) 2 MG tablet Take 2 mg by mouth 4 (four) times daily as needed for diarrhea or loose stools.    [provider]  megestrol (MEGACE) 40 MG tablet Take 1 tablet (40 mg total) by mouth daily. 12/12/18   Crecencio Mc, MD  mirtazapine (REMERON) 30 MG tablet Take 1 tablet (30 mg total) by mouth at bedtime. 03/18/19   Crecencio Mc, MD  nitroGLYCERIN (NITROSTAT) 0.4 MG SL tablet Place 1 tablet (0.4 mg total) under the tongue every 5 (five) minutes x 3 doses as needed for chest pain. 09/28/16   Crecencio Mc, MD  ondansetron (ZOFRAN-ODT) 4 MG disintegrating tablet  01/12/19   [provider]  SYMBICORT 80-4.5 MCG/ACT inhaler Inhale 2 puffs into the lungs 2 (two) times daily. 11/21/18   Crecencio Mc, MD  Syringe/Needle, Disp, (SYRINGE 3CC/22GX3/4") 22G X 3/4" 3 ML MISC Use as directed for B12 injections 03/07/19   Crecencio Mc, MD  traZODone (DESYREL) 50 MG tablet 1-2 tablets one hour before bedtime for insomnia Patient not taking: Reported on 03/24/2019 01/21/19   Crecencio Mc, MD    Allergies Morphine sulfate  Family History  Problem Relation Age of Onset  . Coronary artery disease Mother   . Alzheimer's disease Mother   . Coronary artery disease Father 37  . Heart attack Father   . Throat cancer Brother   . Lung cancer Maternal Aunt   . Kidney cancer Daughter   . Cervical cancer Daughter  Social History Social History   Tobacco Use  . Smoking status: Current Every Day Smoker    Packs/day: 0.50    Types: Cigarettes  . Smokeless tobacco: Never Used  . Tobacco comment: HAS BEEN SMOKING 30+ YEARS,RESUMED TOBACCO USE AFTER AAA REPAIR  Substance Use Topics  . Alcohol use: No  . Drug use: No    Review of Systems Level 5 caveat:  history/ROS limited by chronic dementia   Constitutional: Fever at home for the last 1 to 2 days.  General malaise and fatigue. Eyes: No visual changes. ENT: No sore throat. Cardiovascular: Denies chest pain. Respiratory: Denies shortness of breath.  Chronic COPD. Gastrointestinal: No abdominal pain.  No nausea, no vomiting.  No diarrhea.  No constipation. Genitourinary: Questionable urinary tract infection. Musculoskeletal: Negative for neck pain.  Negative for back pain. Integumentary: Negative for rash. Neurological: Negative for headaches, focal weakness or numbness.   ____________________________________________   PHYSICAL EXAM:  VITAL SIGNS: ED Triage Vitals  Enc Vitals Group     BP 04/02/19 2335 (!) 132/56     Pulse Rate 04/02/19 2335 86     Resp 04/02/19 2335 18     Temp 04/02/19 2335 100.1 F (37.8 C)     Temp Source 04/02/19 2335 Oral     SpO2 04/02/19 2335 97 %     Weight 04/02/19 2337 40.8 kg (90  lb)     Height 04/02/19 2337 1.651 m (5\' 5" )     Head Circumference --      Peak Flow --      Pain Score --      Pain Loc --      Pain Edu? --      Excl. in [place]? --     Constitutional: Sleeping comfortably, awakens to light touch and voice.  Appears chronically ill but is not in acute distress.  Denies pain. Eyes: Conjunctivae are normal.  Head: Atraumatic. Nose: No congestion/rhinnorhea. Mouth/Throat: Patient is wearing a mask. Neck: No stridor.  No meningeal signs.   Cardiovascular: Normal rate, regular rhythm. Good peripheral circulation. Grossly normal heart sounds. Respiratory: Normal respiratory effort.  No retractions. Gastrointestinal: Soft and nondistended.  Mild tenderness to palpation of the suprapubic region.  No rebound or guarding, no other tenderness. Musculoskeletal: No lower extremity tenderness nor edema. No gross deformities of extremities. Neurologic:  Normal speech and language. No gross focal neurologic deficits are appreciated.  Skin:  Skin is warm, dry and intact. Psychiatric: Mood and affect are normal. Speech and behavior are normal.  ____________________________________________   LABS (all labs ordered are listed, but only abnormal results are displayed)  Labs Reviewed  COMPREHENSIVE METABOLIC PANEL - Abnormal; Notable for the following components:      Result Value   CO2 21 (*)    Glucose, Bld 106 (*)    BUN 29 (*)    Calcium 8.3 (*)    Albumin 2.8 (*)    GFR calc non Af Amer 53 (*)    All other components within normal limits  CBC - Abnormal; Notable for the following components:   RBC 3.23 (*)    Hemoglobin 9.6 (*)    HCT 29.2 (*)    All other components within normal limits  URINALYSIS, COMPLETE (UACMP) WITH MICROSCOPIC - Abnormal; Notable for the following components:   Color, Urine YELLOW (*)    APPearance HAZY (*)    Bacteria, UA MANY (*)    All other components within normal limits  URINE CULTURE  ____________________________________________  EKG  No indication for emergent EKG ____________________________________________  RADIOLOGY I, Hinda Kehr, personally viewed and evaluated these images (plain radiographs) as part of my medical decision making, as well as reviewing the written report by the radiologist.  ED MD interpretation: Left lung pneumonia  Official radiology report(s): CT Chest Wo Contrast  Result Date: 04/03/2019 CLINICAL DATA:  Cough. Persistent respiratory illness. History of COPD. Fever. EXAM: CT CHEST WITHOUT CONTRAST TECHNIQUE: Multidetector CT imaging of the chest was performed following the standard protocol without IV contrast. COMPARISON:  Radiograph earlier this day. FINDINGS: Cardiovascular: Advanced aortic atherosclerosis. The thoracic aorta is tortuous. Fusiform aneurysmal dilatation of the descending aorta measuring 4-4.2 cm. Some low-density eccentric mural thrombus is noted distally. There is no periaortic stranding. Dense coronary artery calcifications. Heart is normal in size. No pericardial effusion. Mediastinum/Nodes: Limited assessment for adenopathy given paucity of fat and lack contrast. Prominent lower paratracheal node measures 11 mm, series 2, image 72. Moderate hiatal hernia. No esophageal wall thickening. No thyroid nodule. Lungs/Pleura: Patchy and confluent lingular consolidation corresponding to that on x-ray. There is debris in the distal left mainstem bronchus with filling of the left lower lobe bronchus, possibly mucus but indeterminate. Bronchial filling extends into the segmental branches of the left lower lobe. Minimal tree in bud opacities in the left lower lobe. Emphysema with mild biapical pleuroparenchymal scarring. No pulmonary edema. No pleural fluid. No dominant pulmonary mass. Upper Abdomen: Dense vascular calcifications of the included vasculature. No acute findings. Musculoskeletal: Marked paucity of subcutaneous fat suggest  cachexia. Scoliotic curvature of the spine with multilevel degenerative change. Mild compression fracture of L1 which appears chronic. IMPRESSION: 1. Patchy and confluent lingular consolidation consistent with pneumonia. 2. Debris in the distal left mainstem bronchus with filling of the left lower lobe bronchus, possibly mucous plugging but indeterminate. Bronchial filling extends into the segmental branches of the left lower lobe with mild associated right lower lobe tree-in-bud opacities. Findings may be secondary to mucous plugging or aspiration. 3. Emphysema. 4. Moderate hiatal hernia. 5. Advanced aortic atherosclerosis. Fusiform aneurysmal dilatation of the descending thoracic aorta, maximal dimension 4.3 cm. 6. Coronary artery calcifications. Aortic Atherosclerosis (ICD10-I70.0) and Emphysema (ICD10-J43.9). Electronically Signed   By: Keith Rake M.D.   On: 04/03/2019 05:32   DG Chest Portable 1 View  Result Date: 04/03/2019 CLINICAL DATA:  Confusion and fever EXAM: PORTABLE CHEST 1 VIEW COMPARISON:  06/06/2018 FINDINGS: Lungs are hyperinflated. There are markedly increased opacities in the left mid lung. There is scarring at the left lung base and interstitial coarsening of the right lung. Cardiomediastinal contours are normal. IMPRESSION: 1. Left mid lung opacities, likely pneumonia. 2. COPD Electronically Signed   By: Ulyses Jarred M.D.   On: 04/03/2019 02:55    ____________________________________________   PROCEDURES   Procedure(s) performed (including Critical Care):  Procedures   ____________________________________________   INITIAL IMPRESSION / MDM / [place] / ED COURSE  As part of my medical decision making, I reviewed the following data within the [name] History obtained from family, Nursing notes reviewed and incorporated, Labs reviewed , Old chart reviewed, Radiograph reviewed  and Notes from prior ED visits   Differential diagnosis  includes, but is not limited to, viral illness including COVID-19, pneumonia, urinary tract infection, other acute intra-abdominal infection.  The patient has been declining over extended period of time but tonight she reportedly has had a fever at home and she has a temperature of 100.1 tonight at triage.  However the rest of her vitals are reassuring.  She is typically oxygen dependent at especially with exertion but her oxygen saturation is reassuring on room air tonight and they do have oxygen at home.  I had a conversation with the daughter and the daughter stated unequivocally that the patient has a [place]rod DO NOT RESUSCITATE form at home and that she has had it for quite some time in accordance with the patient's wishes.  I explained that this was not indicated in the palliative care consult recently but the daughter/healthcare power of attorney was adamant that the patient does have a DNR/DNI order in place.  I have reflected this in the order tonight in the computer and I also sent a message through [facility] to the nurse practitioner with palliative care who saw the patient previously, Estevan Oaks, to help with follow-up.  Lab work is generally reassuring tonight with no evidence of acute abnormality including no leukocytosis.  I have asked the nurses to in and out catheterize the patient for urinalysis and urine culture and chest x-ray is pending.  At this point there does not appear to be an indication for admission to the hospital although we may treat empirically for the possibility of urinary tract infection of which the daughter indicated would be her preference.  She had a recent negative COVID-19 test and if there is no other source identified on chest x-ray or urinalysis we may send another outpatient test given their recent close contact.      Clinical Course as of Apr 02 602  Fri Apr 03, 2019  0355 Possible pneumonia in left mid-lung.  Given her history of lung disease and scarring, I  will further evaluate with CT chest so that I do not put her on antibiotics unnecessarily or prescribe the wrong antibiotics  DG Chest Portable 1 View [CF]  0600 CT scan consistent with lingular pneumonia.  Patient has been stable during 6 and half hours in the emergency department.  When I entered the room she smiled at me and is in no distress.  I updated her daughter with the findings and my recommendation for outpatient antibiotics and close follow-up and she understands and agrees with the plan.  I gave my usual and customary return precautions.   [CF]    Clinical Course User Index [CF] Hinda Kehr, MD     ____________________________________________  FINAL CLINICAL IMPRESSION(S) / ED DIAGNOSES  Final diagnoses:  Community acquired pneumonia of left lung, unspecified part of lung     MEDICATIONS GIVEN DURING THIS VISIT:  Medications  sodium chloride flush (NS) 0.9 % injection 3 mL (has no administration in time range)  amoxicillin-clavulanate (AUGMENTIN) 875-125 MG per tablet 1 tablet (has no administration in time range)  azithromycin (ZITHROMAX) tablet 500 mg (has no administration in time range)  acetaminophen (TYLENOL) tablet 650 mg (650 mg Oral Given 04/02/19 2348)     ED Discharge Orders         Ordered    amoxicillin-clavulanate (AUGMENTIN) 875-125 MG tablet  Every 12 hours     04/03/19 0559    azithromycin (ZITHROMAX) 250 MG tablet     04/03/19 0559          *Please note:  [name] was evaluated in Emergency Department on 04/03/2019 for the symptoms described in the history of present illness. She was evaluated in the context of the global COVID-19 pandemic, which necessitated consideration that the patient might be at risk for infection with the  SARS-CoV-2 virus that causes COVID-19. Institutional protocols and algorithms that pertain to the evaluation of patients at risk for COVID-19 are in a state of rapid change based on information released by  regulatory bodies including the CDC and federal and state organizations. These policies and algorithms were followed during the patient's care in the ED.  Some ED evaluations and interventions may be delayed as a result of limited staffing during the pandemic.*  Note:  This document was prepared using Dragon voice recognition software and may include unintentional dictation errors.   Hinda Kehr, MD 04/03/19 [phone]

## 2019-04-03 NOTE — ED Notes (Signed)
Pt unable to sign d/t dementia status.

## 2019-04-03 NOTE — Telephone Encounter (Signed)
There is no evidence of UTI by culture results.  If she is still having symptoms of confusion she should make appointment , but I see that she was treated in the ER for pneumonia which Is what my concern would be

## 2019-04-05 LAB — URINE CULTURE
Culture: >=100000 — AB
Special Requests: NORMAL

## 2019-04-06 DIAGNOSIS — R197 Diarrhea, unspecified: Secondary | ICD-10-CM | POA: Diagnosis not present

## 2019-04-06 DIAGNOSIS — M810 Age-related osteoporosis without current pathological fracture: Secondary | ICD-10-CM | POA: Diagnosis not present

## 2019-04-06 DIAGNOSIS — K2270 Barrett's esophagus without dysplasia: Secondary | ICD-10-CM | POA: Diagnosis not present

## 2019-04-06 DIAGNOSIS — G8929 Other chronic pain: Secondary | ICD-10-CM | POA: Diagnosis not present

## 2019-04-06 DIAGNOSIS — D519 Vitamin B12 deficiency anemia, unspecified: Secondary | ICD-10-CM | POA: Diagnosis not present

## 2019-04-06 DIAGNOSIS — D509 Iron deficiency anemia, unspecified: Secondary | ICD-10-CM | POA: Diagnosis not present

## 2019-04-06 DIAGNOSIS — G253 Myoclonus: Secondary | ICD-10-CM | POA: Diagnosis not present

## 2019-04-06 DIAGNOSIS — I2510 Atherosclerotic heart disease of native coronary artery without angina pectoris: Secondary | ICD-10-CM | POA: Diagnosis not present

## 2019-04-06 DIAGNOSIS — I69318 Other symptoms and signs involving cognitive functions following cerebral infarction: Secondary | ICD-10-CM | POA: Diagnosis not present

## 2019-04-06 DIAGNOSIS — K5790 Diverticulosis of intestine, part unspecified, without perforation or abscess without bleeding: Secondary | ICD-10-CM | POA: Diagnosis not present

## 2019-04-06 DIAGNOSIS — J449 Chronic obstructive pulmonary disease, unspecified: Secondary | ICD-10-CM | POA: Diagnosis not present

## 2019-04-06 NOTE — Progress Notes (Signed)
Brief Pharmacy Note  Patient is an 85 y/o F with medical hx including COPD and dementia who presented to [facility] ED 12/11 c/o confusion and intermittent fevers x1-2 days. CT consistent with pneumonia. Patient was discharged home on amoxicillin-clavulanate + azithromycin. Urine culture (catheterized) from ED visit has resulted >100k colonies/mL E coli (pan-sensitive) and 60k colonies/mL K pneumoniae (ampicillin resistant). Sensitivity data of K pneumoniae to amoxicillin-clavulanate not reported but empirically this antibiotic should cover this organism. Patient appropriately covered on discharge antibiotic.  [facility] Resident 06 April 2019

## 2019-04-07 NOTE — Telephone Encounter (Signed)
Pt was seen in the ED and diagnosed with pneumonia.

## 2019-04-08 ENCOUNTER — Other Ambulatory Visit: Payer: Self-pay | Admitting: Internal Medicine

## 2019-04-08 ENCOUNTER — Telehealth: Payer: Self-pay

## 2019-04-08 DIAGNOSIS — J449 Chronic obstructive pulmonary disease, unspecified: Secondary | ICD-10-CM

## 2019-04-08 DIAGNOSIS — I2510 Atherosclerotic heart disease of native coronary artery without angina pectoris: Secondary | ICD-10-CM

## 2019-04-08 DIAGNOSIS — K257 Chronic gastric ulcer without hemorrhage or perforation: Secondary | ICD-10-CM

## 2019-04-08 NOTE — Telephone Encounter (Signed)
Received further update from [place] nurse that patient continues to decline. Patient is not eating or drinking. Recently, patient was in ED and treated for PNA. Referral [facility]/[place] made aware.

## 2019-04-08 NOTE — Telephone Encounter (Signed)
Received message from [facility] with [place] that clinical team feels patient needs to be transitioned to Hospice ASAP. Primary NP not in office today. Message sent to [facility] to obtain further information on [facility] assessment and contact Palliative NP.

## 2019-04-13 ENCOUNTER — Other Ambulatory Visit: Payer: Self-pay

## 2019-04-13 ENCOUNTER — Other Ambulatory Visit: Payer: Medicare Other | Admitting: Primary Care

## 2019-04-13 ENCOUNTER — Telehealth: Payer: Self-pay | Admitting: Internal Medicine

## 2019-04-13 NOTE — Telephone Encounter (Signed)
She can stop atorvastatin , plavix,  B12,  dexilant , vitamin d, ferrous sulfate,  escitalopram  And , megestrol,

## 2019-04-13 NOTE — Telephone Encounter (Signed)
Jennifer with Hospice called and needed to get pt med list and what meds pt can come off Please contact [facility] [place] at [phone]

## 2019-04-13 NOTE — Telephone Encounter (Signed)
Spoke with Anna Prince to let her know of the medications that Dr. Derrel Nip stated that pt could stop taking. Anna Prince gave a verbal understanding.

## 2019-04-30 ENCOUNTER — Telehealth: Payer: Self-pay

## 2019-04-30 NOTE — Telephone Encounter (Signed)
Anderson Malta, RN with Hospice called stating that pt is getting very agitated at night. She is taking liquid lorazepam and haldol to try to help. Anderson Malta stated that the pt is having an adverse reaction to the lorazepam and would like to see if something like Trazodone called be called in for the pt to help with sleep.   Call back # [phone]

## 2019-04-30 NOTE — Telephone Encounter (Signed)
SHE SHOULD ALREADY HAVE IT,  WAS PRESCRIBED AND SENT TO PHARMACY IN September

## 2019-04-30 NOTE — Telephone Encounter (Signed)
Spoke with Anderson Malta, RN to let her know that Dr. Derrel Nip is okay with pt trying the trazodone to help with sleep at night and that she should still have the rx with refills because it was sent in in September. Anderson Malta gave a verbal understanding.

## 2019-05-06 ENCOUNTER — Telehealth: Payer: Self-pay | Admitting: Internal Medicine

## 2019-05-06 MED ORDER — LISINOPRIL 20 MG PO TABS: 20.0000 mg | ORAL_TABLET | Freq: Every day | ORAL | 1 refills | Status: AC

## 2019-05-06 NOTE — Telephone Encounter (Signed)
Spoke with Anderson Malta from [facility] about her blood pressure. She stated that the pt's blood pressure yesterday was 190/115. Anderson Malta stated that the pt's blood pressure has never been a concern before. She stated that when the pt was admitted to hospice care a lot of her medications was discontinued including her blood pressure medication but it wasn't until yesterday that it was elevated. Anderson Malta stated that yesterday was also the first day that she had seen the pt with her hair fixed and not laying in her bed, she was sitting in the recliner in the living room, so she had been up and moving around.

## 2019-05-06 NOTE — Telephone Encounter (Signed)
Anderson Malta called from [facility] regarding pt needing a Rx for BP. Please call Anderson Malta @ [phone] Call into pharmacy.  Pharmacy is [facility] [place], [place]

## 2019-05-06 NOTE — Telephone Encounter (Signed)
lisinopril 20 mg daily   Sent to [facility]

## 2019-05-06 NOTE — Telephone Encounter (Signed)
Spoke with Anderson Malta with [facility] to let her know that lisinopril 20 mg once daily has been sent to [facility] in [place] for the pt to start taking for elevated bp.

## 2019-05-19 ENCOUNTER — Telehealth: Payer: Self-pay | Admitting: Internal Medicine

## 2019-05-19 MED ORDER — HYDROMORPHONE HCL 2 MG PO TABS: 2.0000 mg | ORAL_TABLET | ORAL | 0 refills | Status: DC | PRN

## 2019-05-19 NOTE — Telephone Encounter (Signed)
Anna Prince with [facility] called and asked for a refill on Hydronorphone 2mg  every four hours for pain and shortness of breath. Pt is allergic to morphine. Please send to [facility] in [place]

## 2019-05-19 NOTE — Telephone Encounter (Signed)
I was not able to reach [place].  Please try her again.  If not able to reach, please call [phone] and see if can help [place] with dosing of medication.  Thanks.

## 2019-05-19 NOTE — Telephone Encounter (Signed)
On call nurse, Horris Latino, will be calling you in about 10 minutes

## 2019-05-19 NOTE — Telephone Encounter (Signed)
rx sent in for hydromorphone 2mg  q 4 hours prn (#90) with no refills. Hospice pt.

## 2019-05-28 ENCOUNTER — Other Ambulatory Visit: Payer: Self-pay

## 2019-05-28 MED ORDER — ALBUTEROL SULFATE HFA 108 (90 BASE) MCG/ACT IN AERS: 2.0000 | INHALATION_SPRAY | Freq: Four times a day (QID) | RESPIRATORY_TRACT | 2 refills | Status: DC | PRN

## 2019-05-29 ENCOUNTER — Telehealth: Payer: Self-pay

## 2019-05-29 NOTE — Telephone Encounter (Signed)
PA for Diazepam has been submitted on covermymeds.   

## 2019-06-10 ENCOUNTER — Telehealth: Payer: Self-pay | Admitting: Internal Medicine

## 2019-06-10 MED ORDER — ALBUTEROL SULFATE HFA 108 (90 BASE) MCG/ACT IN AERS: 2.0000 | INHALATION_SPRAY | RESPIRATORY_TRACT | 2 refills | Status: AC | PRN

## 2019-06-10 NOTE — Telephone Encounter (Signed)
Please call  Hospice and clarify requests.  Patient is not taking lorazepam ,  And should be taking mirtazipine and trazodone,  So an additional antidepressant stands to interact with these two

## 2019-06-10 NOTE — Telephone Encounter (Signed)
I have refilled the albuterol.

## 2019-06-10 NOTE — Telephone Encounter (Signed)
Patient is needing a increase in her lorazetapam and a refill on albuterol  nebulizer solution , every 4hrs as needed for shortness of breathe. Also requesting a antidepressant if possible.  Please call Anderson Malta, from Hospice call [phone]. Pharmacy is [facility].

## 2019-06-11 MED ORDER — LORAZEPAM 0.5 MG PO TABS: 0.5000 mg | ORAL_TABLET | Freq: Two times a day (BID) | ORAL | 5 refills | Status: AC | PRN

## 2019-06-11 MED ORDER — ALBUTEROL SULFATE (2.5 MG/3ML) 0.083% IN NEBU: 2.5000 mg | INHALATION_SOLUTION | RESPIRATORY_TRACT | 1 refills | Status: AC | PRN

## 2019-06-11 NOTE — Telephone Encounter (Signed)
Spoke with Anderson Malta, from Hospice and she stated that the pt is taking Lorazepam 0.5 mg once daily. She stated that it is one of the medications that is on the Hospice protocol that is signed by the doctor. Anderson Malta stated that she was driving down the road and was not able to look at the pt's medication list and advised that I call the pt's daughter to verify the medications that the pt was taking. I tried calling the daughter and left a message for her to give [place] a call back.

## 2019-06-11 NOTE — Telephone Encounter (Signed)
I will refill the lorazepam 9.5 mg for twice daily use  .  sent to [facility]

## 2019-06-11 NOTE — Telephone Encounter (Signed)
Spoke with Anderson Malta, RN to let her know that the Lorazepam has been sent to pharmacy for BID PRN use. She also wanted to see if we could send in a rx for albuterol nebulizer solution to use every 4 hours as needed.

## 2019-06-24 ENCOUNTER — Telehealth: Payer: Self-pay | Admitting: Internal Medicine

## 2019-06-24 MED ORDER — HYDROCODONE-ACETAMINOPHEN 5-325 MG PO TABS: 1.0000 | ORAL_TABLET | Freq: Two times a day (BID) | ORAL | 0 refills | Status: DC | PRN

## 2019-06-24 NOTE — Telephone Encounter (Signed)
LMTCB with Hospice nurse, Anderson Malta.

## 2019-06-24 NOTE — Telephone Encounter (Signed)
Vicodin request received and Refilled, please check with hospice that this request was generated by them

## 2019-06-24 NOTE — Telephone Encounter (Signed)
Routed first note to wrong CMA. See note below.

## 2019-06-24 NOTE — Telephone Encounter (Signed)
Refill request for Norco, last seen 03-06-19, last filled 02-10-19.  Please advise.

## 2019-06-24 NOTE — Telephone Encounter (Signed)
Patient's daughter is having a hard time getting her HYDROcodone-acetaminophen (NORCO/VICODIN) 5-325 MG tablet refilled for her mother. Pharmacy needs a new prescription.

## 2019-06-25 MED ORDER — HYDROMORPHONE HCL 2 MG PO TABS: 2.0000 mg | ORAL_TABLET | ORAL | 0 refills | Status: AC | PRN

## 2019-06-25 NOTE — Telephone Encounter (Signed)
So you will need to call the pharmacy and cancel the hydrocodone.  I will refill the other one now

## 2019-06-25 NOTE — Telephone Encounter (Signed)
I called [facility] & spoke with pharmacist Joe to cancel prescription of hydrocodone.

## 2019-06-25 NOTE — Telephone Encounter (Signed)
Spoke with Anderson Malta, RN with Hospice and she stated that it is the hydromorphone that is needed not the hydrocodone. She stated that is what the daughter told her. She also stated that Hospice only covers 15 at a time so she stated that it may seem like they are requesting this frequently.

## 2019-07-22 ENCOUNTER — Telehealth: Payer: Self-pay | Admitting: Internal Medicine

## 2019-07-22 NOTE — Telephone Encounter (Signed)
Noted Dr. Derrel Nip is aware pt passed away.

## 2019-07-22 NOTE — Telephone Encounter (Signed)
Pt daughter called to cancel an up coming appt her mother passed away on 2019-08-01

## 2019-07-23 DEATH — deceased

## 2019-07-27 ENCOUNTER — Ambulatory Visit: Payer: Medicare Other

## 2020-08-22 IMAGING — CT CT CHEST W/O CM
2 of 3 series · 15 of 36 positions shown, 18 images · non-contrast
Comparison: Radiograph earlier this day.

CLINICAL DATA: Cough. Persistent respiratory illness. History of
COPD. Fever.

EXAM:
CT CHEST WITHOUT CONTRAST
TECHNIQUE: Multidetector CT imaging of the chest was performed following the
standard protocol without IV contrast.

[Series 2: thorax · axial · 0.68mm/px · z∈[-741,-439]mm · 12 of 179 slices shown, 15 images]
[im 14/179  mediastinal]
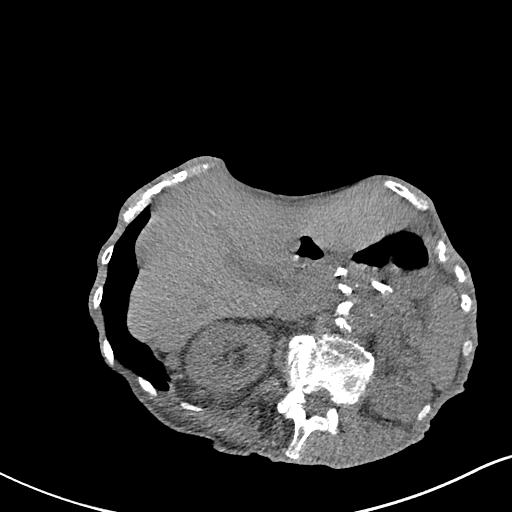
[im 14/179  lung]
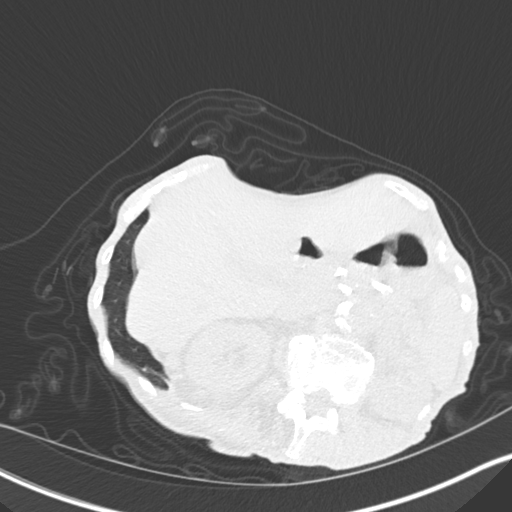
[im 27/179  lung]
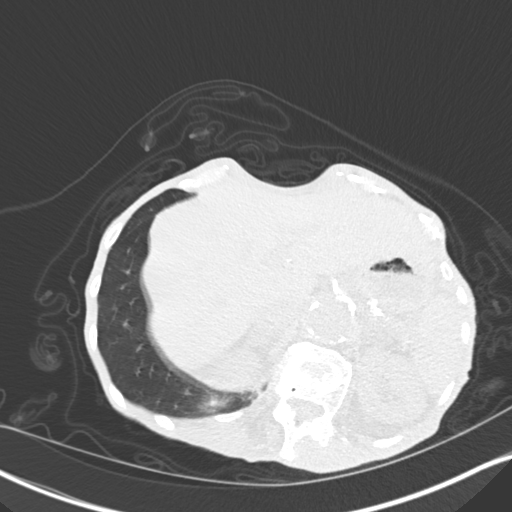
[im 40/179  lung]
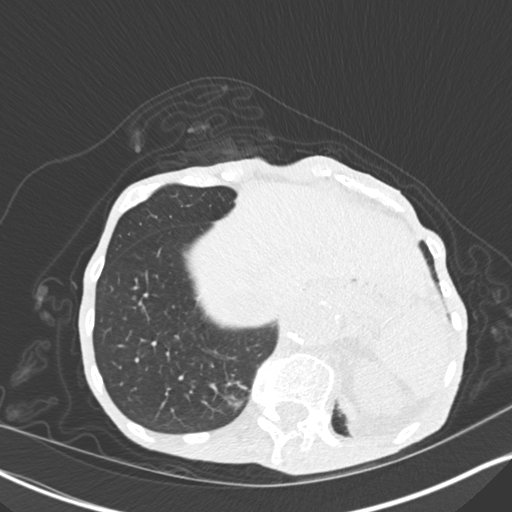
[im 53/179  lung]
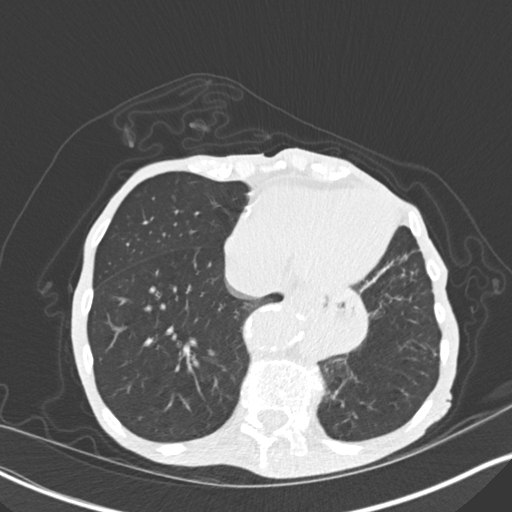
[im 66/179  mediastinal]
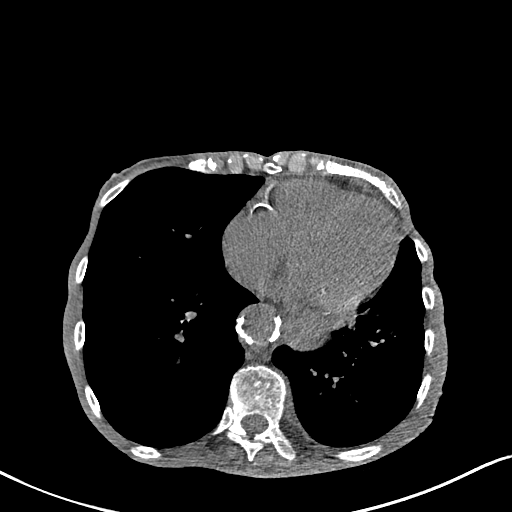
[im 66/179  lung]
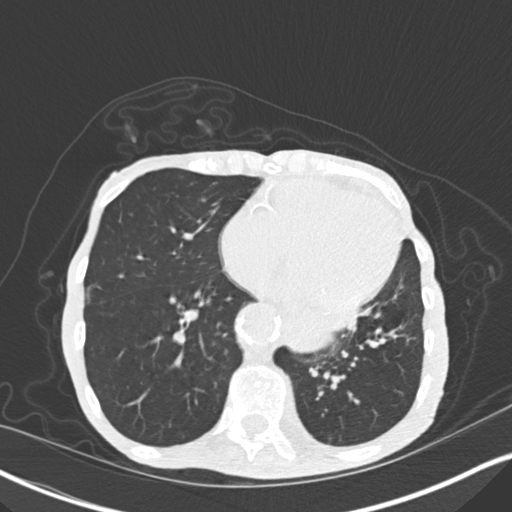
[im 80/179  lung]
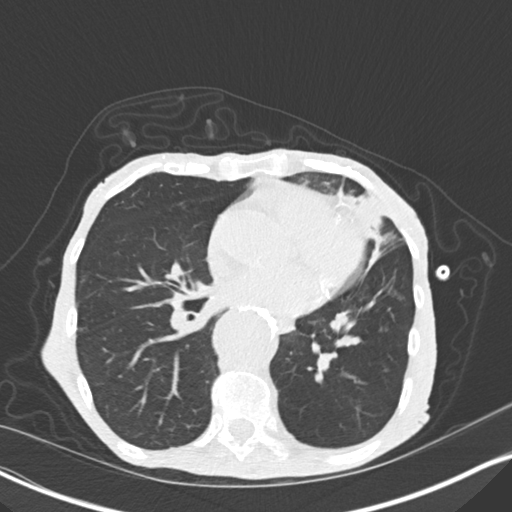
[im 99/179  lung]
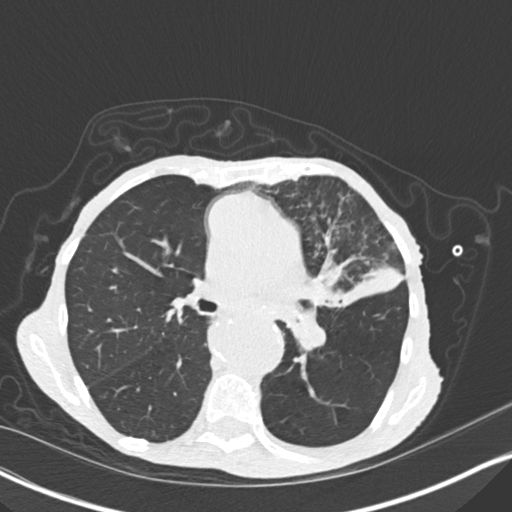
[im 113/179  lung]
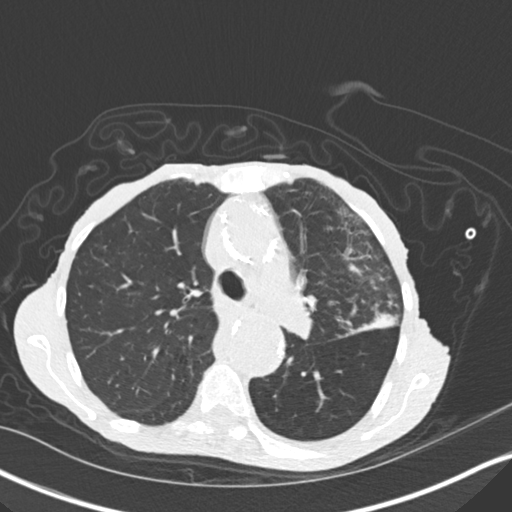
[im 126/179  mediastinal]
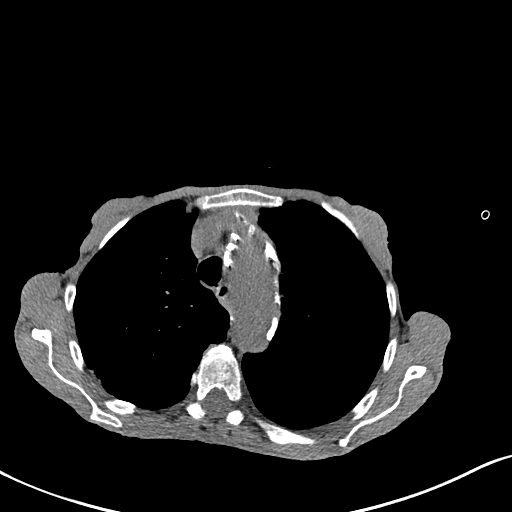
[im 126/179  lung]
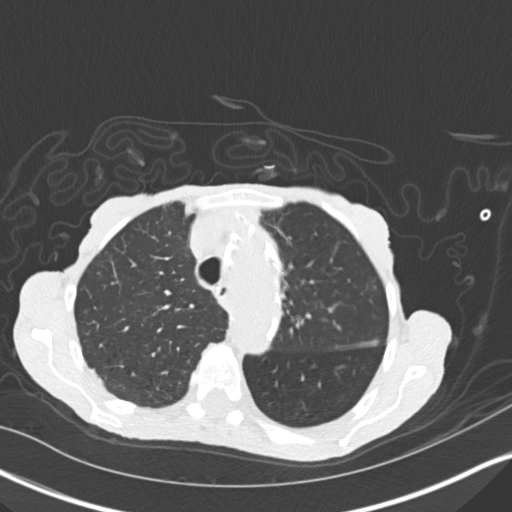
[im 139/179  lung]
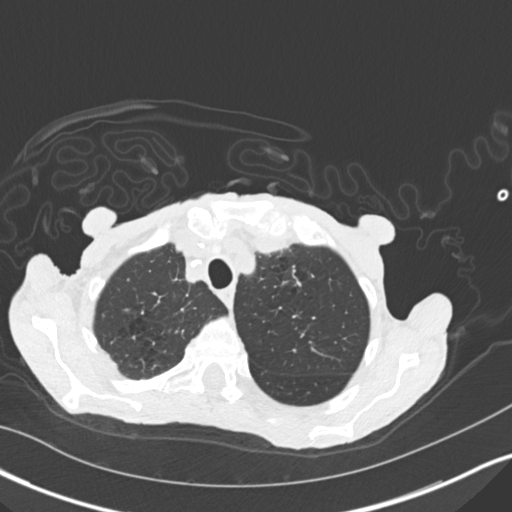
[im 152/179  lung]
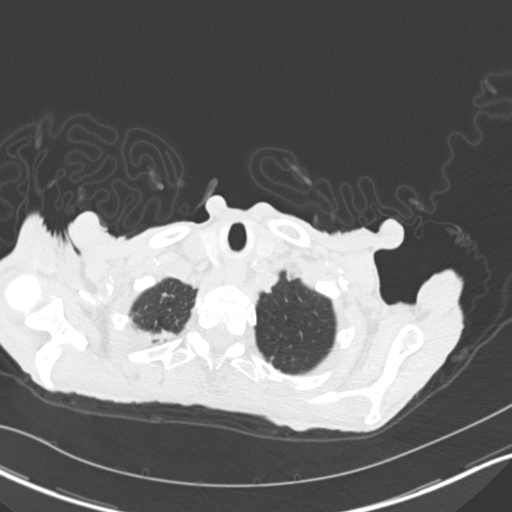
[im 165/179  lung]
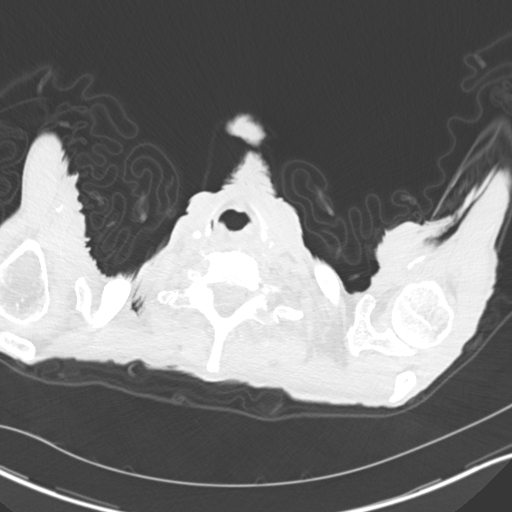

[Series 5: coronal · coronal · 0.64mm/px · 3 of 110 slices shown]
[im 22/110  lung]
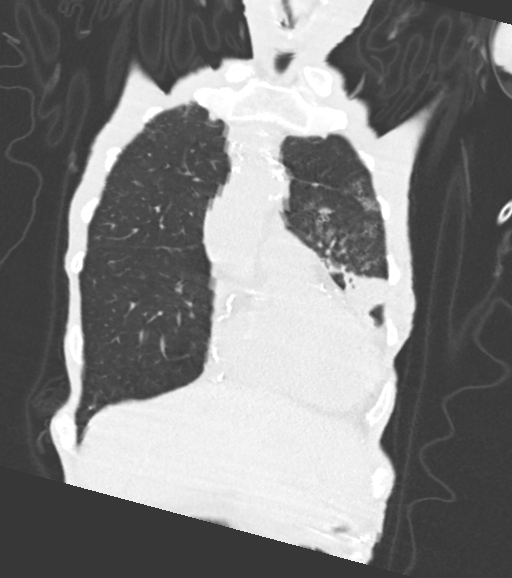
[im 44/110  lung]
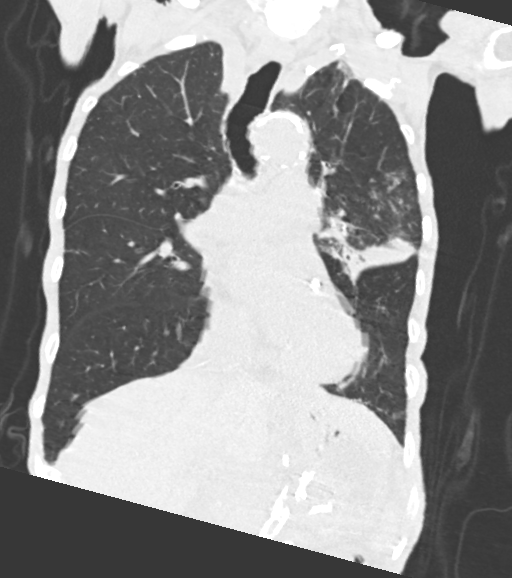
[im 66/110  lung]
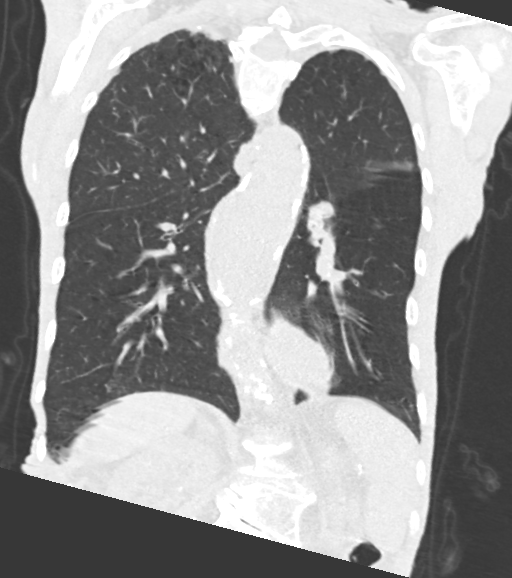

[15 of 36 positions shown; findings below may reference images not displayed]

FINDINGS: Cardiovascular: Advanced aortic atherosclerosis. The thoracic aorta
is tortuous. Fusiform aneurysmal dilatation of the descending aorta
measuring 4-4.2 cm. Some low-density eccentric mural thrombus is
noted distally. There is no periaortic stranding. Dense coronary
artery calcifications. Heart is normal in size. No pericardial
effusion.

Mediastinum/Nodes: Limited assessment for adenopathy given paucity
of fat and lack contrast. Prominent lower paratracheal node measures
11 mm, series 2, image 72. Moderate hiatal hernia. No esophageal
wall thickening. No thyroid nodule.

Lungs/Pleura: Patchy and confluent lingular consolidation
corresponding to that on x-ray. There is debris in the distal left
mainstem bronchus with filling of the left lower lobe bronchus,
possibly mucus but indeterminate. Bronchial filling extends into the
segmental branches of the left lower lobe. Minimal tree in bud
opacities in the left lower lobe. Emphysema with mild biapical
pleuroparenchymal scarring. No pulmonary edema. No pleural fluid. No
dominant pulmonary mass.

Upper Abdomen: Dense vascular calcifications of the included
vasculature. No acute findings.

Musculoskeletal: Marked paucity of subcutaneous fat suggest
cachexia. Scoliotic curvature of the spine with multilevel
degenerative change. Mild compression fracture of L1 which appears
chronic.
IMPRESSION: 1. Patchy and confluent lingular consolidation consistent with
pneumonia.
2. Debris in the distal left mainstem bronchus with filling of the
left lower lobe bronchus, possibly mucous plugging but
indeterminate. Bronchial filling extends into the segmental branches
of the left lower lobe with mild associated right lower lobe
tree-in-bud opacities. Findings may be secondary to mucous plugging
or aspiration.
3. Emphysema.
4. Moderate hiatal hernia.
5. Advanced aortic atherosclerosis. Fusiform aneurysmal dilatation
of the descending thoracic aorta, maximal dimension 4.3 cm.
6. Coronary artery calcifications.

Aortic Atherosclerosis (2PL1C-XJS.S) and Emphysema (2PL1C-6Q1.S).
